# Patient Record
Sex: Female | Born: 1935
Health system: Southern US, Community
[De-identification: ages and names within clinical notes are randomized; demographics above are authoritative.]

## PROBLEM LIST (undated history)

## (undated) DIAGNOSIS — R87629 Unspecified abnormal cytological findings in specimens from vagina: Secondary | ICD-10-CM

## (undated) DIAGNOSIS — R269 Unspecified abnormalities of gait and mobility: Secondary | ICD-10-CM

## (undated) DIAGNOSIS — R1310 Dysphagia, unspecified: Secondary | ICD-10-CM

## (undated) DIAGNOSIS — G960 Cerebrospinal fluid leak: Secondary | ICD-10-CM

## (undated) DIAGNOSIS — E538 Deficiency of other specified B group vitamins: Secondary | ICD-10-CM

## (undated) DIAGNOSIS — R32 Unspecified urinary incontinence: Secondary | ICD-10-CM

## (undated) DIAGNOSIS — Z8673 Personal history of transient ischemic attack (TIA), and cerebral infarction without residual deficits: Secondary | ICD-10-CM

## (undated) DIAGNOSIS — E039 Hypothyroidism, unspecified: Secondary | ICD-10-CM

## (undated) DIAGNOSIS — K589 Irritable bowel syndrome without diarrhea: Secondary | ICD-10-CM

## (undated) DIAGNOSIS — E782 Mixed hyperlipidemia: Secondary | ICD-10-CM

## (undated) DIAGNOSIS — M1990 Unspecified osteoarthritis, unspecified site: Secondary | ICD-10-CM

## (undated) DIAGNOSIS — G14 Postpolio syndrome: Secondary | ICD-10-CM

## (undated) DIAGNOSIS — G252 Other specified forms of tremor: Secondary | ICD-10-CM

## (undated) DIAGNOSIS — G2581 Restless legs syndrome: Secondary | ICD-10-CM

## (undated) DIAGNOSIS — D510 Vitamin B12 deficiency anemia due to intrinsic factor deficiency: Secondary | ICD-10-CM

## (undated) DIAGNOSIS — G20 Parkinson's disease: Secondary | ICD-10-CM

## (undated) DIAGNOSIS — W19XXXA Unspecified fall, initial encounter: Secondary | ICD-10-CM

## (undated) DIAGNOSIS — M542 Cervicalgia: Secondary | ICD-10-CM

## (undated) DIAGNOSIS — R531 Weakness: Secondary | ICD-10-CM

## (undated) DIAGNOSIS — M8580 Other specified disorders of bone density and structure, unspecified site: Secondary | ICD-10-CM

## (undated) HISTORY — DX: Other specified disorders of bone density and structure, unspecified site: M85.80

## (undated) HISTORY — PX: CATARACT EXTRACTION, BILATERAL: SHX1313

## (undated) HISTORY — DX: Irritable bowel syndrome without diarrhea: K58.9

## (undated) HISTORY — DX: Mixed hyperlipidemia: E78.2

## (undated) HISTORY — DX: Unspecified abnormalities of gait and mobility: R26.9

## (undated) HISTORY — DX: Cervicalgia: M54.2

## (undated) HISTORY — DX: Unspecified osteoarthritis, unspecified site: M19.90

## (undated) HISTORY — DX: Dysphagia, unspecified: R13.10

## (undated) HISTORY — DX: Personal history of transient ischemic attack (TIA), and cerebral infarction without residual deficits: Z86.73

## (undated) HISTORY — DX: Unspecified fall, initial encounter: W19.XXXA

## (undated) HISTORY — DX: Unspecified urinary incontinence: R32

## (undated) HISTORY — DX: Postpolio syndrome: G14

## (undated) HISTORY — PX: YAG LASER APPLICATION: SHX6189

## (undated) HISTORY — DX: Weakness: R53.1

## (undated) HISTORY — DX: Other specified forms of tremor: G25.2

## (undated) HISTORY — DX: Vitamin B12 deficiency anemia due to intrinsic factor deficiency: D51.0

## (undated) HISTORY — PX: OTHER SURGICAL HISTORY: SHX169

## (undated) HISTORY — DX: Hypothyroidism, unspecified: E03.9

## (undated) HISTORY — DX: Unspecified abnormal cytological findings in specimens from vagina: R87.629

## (undated) HISTORY — DX: Deficiency of other specified B group vitamins: E53.8

## (undated) HISTORY — DX: Restless legs syndrome: G25.81

## (undated) HISTORY — DX: Cerebrospinal fluid leak: G96.0

## (undated) HISTORY — DX: Parkinson's disease: G20

---

## 1998-11-05 ENCOUNTER — Other Ambulatory Visit: Admission: RE | Admit: 1998-11-05 | Discharge: 1998-11-05 | Payer: Self-pay | Admitting: Obstetrics and Gynecology

## 1998-11-24 HISTORY — PX: BLADDER SUSPENSION: SHX72

## 1999-04-16 ENCOUNTER — Encounter: Payer: Self-pay | Admitting: Emergency Medicine

## 1999-04-16 ENCOUNTER — Emergency Department (HOSPITAL_COMMUNITY): Admission: EM | Admit: 1999-04-16 | Discharge: 1999-04-16 | Payer: Self-pay | Admitting: Emergency Medicine

## 2000-02-12 ENCOUNTER — Other Ambulatory Visit: Admission: RE | Admit: 2000-02-12 | Discharge: 2000-02-12 | Payer: Self-pay | Admitting: Obstetrics and Gynecology

## 2000-03-27 ENCOUNTER — Ambulatory Visit (HOSPITAL_COMMUNITY): Admission: RE | Admit: 2000-03-27 | Discharge: 2000-03-27 | Payer: Self-pay | Admitting: Ophthalmology

## 2001-05-17 ENCOUNTER — Other Ambulatory Visit: Admission: RE | Admit: 2001-05-17 | Discharge: 2001-05-17 | Payer: Self-pay | Admitting: Obstetrics and Gynecology

## 2001-06-11 ENCOUNTER — Emergency Department (HOSPITAL_COMMUNITY): Admission: EM | Admit: 2001-06-11 | Discharge: 2001-06-11 | Payer: Self-pay | Admitting: Emergency Medicine

## 2001-06-11 ENCOUNTER — Encounter: Payer: Self-pay | Admitting: Emergency Medicine

## 2002-01-01 ENCOUNTER — Emergency Department (HOSPITAL_COMMUNITY): Admission: EM | Admit: 2002-01-01 | Discharge: 2002-01-01 | Payer: Self-pay | Admitting: Emergency Medicine

## 2002-01-01 ENCOUNTER — Encounter: Payer: Self-pay | Admitting: Emergency Medicine

## 2002-05-31 ENCOUNTER — Other Ambulatory Visit: Admission: RE | Admit: 2002-05-31 | Discharge: 2002-05-31 | Payer: Self-pay | Admitting: Obstetrics & Gynecology

## 2004-08-22 ENCOUNTER — Other Ambulatory Visit: Admission: RE | Admit: 2004-08-22 | Discharge: 2004-08-22 | Payer: Self-pay | Admitting: Obstetrics & Gynecology

## 2005-09-09 ENCOUNTER — Ambulatory Visit: Payer: Self-pay | Admitting: Internal Medicine

## 2005-09-11 ENCOUNTER — Other Ambulatory Visit: Admission: RE | Admit: 2005-09-11 | Discharge: 2005-09-11 | Payer: Self-pay | Admitting: Obstetrics & Gynecology

## 2005-12-16 ENCOUNTER — Ambulatory Visit: Payer: Self-pay | Admitting: Internal Medicine

## 2006-02-19 ENCOUNTER — Ambulatory Visit: Payer: Self-pay | Admitting: Internal Medicine

## 2006-06-18 ENCOUNTER — Ambulatory Visit: Payer: Self-pay | Admitting: Internal Medicine

## 2006-07-01 ENCOUNTER — Ambulatory Visit: Payer: Self-pay | Admitting: Internal Medicine

## 2006-07-08 ENCOUNTER — Ambulatory Visit: Payer: Self-pay | Admitting: Internal Medicine

## 2006-07-29 ENCOUNTER — Ambulatory Visit: Payer: Self-pay | Admitting: Internal Medicine

## 2006-09-16 ENCOUNTER — Ambulatory Visit: Payer: Self-pay | Admitting: Internal Medicine

## 2006-09-24 DIAGNOSIS — R87629 Unspecified abnormal cytological findings in specimens from vagina: Secondary | ICD-10-CM | POA: Insufficient documentation

## 2006-09-24 HISTORY — DX: Unspecified abnormal cytological findings in specimens from vagina: R87.629

## 2007-02-26 DIAGNOSIS — Z9889 Other specified postprocedural states: Secondary | ICD-10-CM

## 2007-11-17 ENCOUNTER — Encounter: Payer: Self-pay | Admitting: Internal Medicine

## 2008-01-04 ENCOUNTER — Telehealth (INDEPENDENT_AMBULATORY_CARE_PROVIDER_SITE_OTHER): Payer: Self-pay | Admitting: *Deleted

## 2008-01-04 ENCOUNTER — Ambulatory Visit: Payer: Self-pay | Admitting: Internal Medicine

## 2008-01-04 DIAGNOSIS — M8580 Other specified disorders of bone density and structure, unspecified site: Secondary | ICD-10-CM | POA: Insufficient documentation

## 2008-01-04 DIAGNOSIS — E039 Hypothyroidism, unspecified: Secondary | ICD-10-CM

## 2008-01-11 ENCOUNTER — Ambulatory Visit: Payer: Self-pay | Admitting: Internal Medicine

## 2008-01-12 ENCOUNTER — Encounter: Payer: Self-pay | Admitting: Internal Medicine

## 2008-01-13 ENCOUNTER — Encounter (INDEPENDENT_AMBULATORY_CARE_PROVIDER_SITE_OTHER): Payer: Self-pay | Admitting: *Deleted

## 2008-01-13 LAB — CONVERTED CEMR LAB
Basophils Absolute: 0 10*3/uL (ref 0.0–0.1)
Basophils Relative: 0.4 % (ref 0.0–1.0)
Eosinophils Absolute: 0.2 10*3/uL (ref 0.0–0.6)
Eosinophils Relative: 3.4 % (ref 0.0–5.0)
HCT: 37.5 % (ref 36.0–46.0)
Hemoglobin: 12.7 g/dL (ref 12.0–15.0)
Lymphocytes Relative: 30.7 % (ref 12.0–46.0)
MCHC: 33.9 g/dL (ref 30.0–36.0)
MCV: 91.2 fL (ref 78.0–100.0)
Monocytes Absolute: 0.4 10*3/uL (ref 0.2–0.7)
Monocytes Relative: 8.8 % (ref 3.0–11.0)
Neutro Abs: 2.7 10*3/uL (ref 1.4–7.7)
Neutrophils Relative %: 56.7 % (ref 43.0–77.0)
Platelets: 212 10*3/uL (ref 150–400)
RBC: 4.11 M/uL (ref 3.87–5.11)
RDW: 12.4 % (ref 11.5–14.6)
TSH: 7.82 microintl units/mL — ABNORMAL HIGH (ref 0.35–5.50)
Vit D, 1,25-Dihydroxy: 10 — ABNORMAL LOW (ref 30–89)
Vitamin B-12: 149 pg/mL — ABNORMAL LOW (ref 211–911)
WBC: 4.8 10*3/uL (ref 4.5–10.5)

## 2008-01-14 ENCOUNTER — Ambulatory Visit: Payer: Self-pay | Admitting: Internal Medicine

## 2008-01-21 ENCOUNTER — Encounter (INDEPENDENT_AMBULATORY_CARE_PROVIDER_SITE_OTHER): Payer: Self-pay | Admitting: *Deleted

## 2008-01-26 ENCOUNTER — Ambulatory Visit: Payer: Self-pay | Admitting: Internal Medicine

## 2008-01-26 DIAGNOSIS — R87615 Unsatisfactory cytologic smear of cervix: Secondary | ICD-10-CM | POA: Insufficient documentation

## 2008-01-26 DIAGNOSIS — Z8673 Personal history of transient ischemic attack (TIA), and cerebral infarction without residual deficits: Secondary | ICD-10-CM

## 2008-01-26 DIAGNOSIS — E782 Mixed hyperlipidemia: Secondary | ICD-10-CM

## 2008-01-26 DIAGNOSIS — N949 Unspecified condition associated with female genital organs and menstrual cycle: Secondary | ICD-10-CM | POA: Insufficient documentation

## 2008-01-26 HISTORY — DX: Mixed hyperlipidemia: E78.2

## 2008-01-26 HISTORY — DX: Personal history of transient ischemic attack (TIA), and cerebral infarction without residual deficits: Z86.73

## 2008-01-26 LAB — CONVERTED CEMR LAB
Cholesterol, target level: 200 mg/dL
HDL goal, serum: 40 mg/dL
LDL Goal: 160 mg/dL

## 2008-02-02 ENCOUNTER — Ambulatory Visit: Payer: Self-pay | Admitting: Internal Medicine

## 2008-02-07 ENCOUNTER — Ambulatory Visit: Payer: Self-pay | Admitting: Internal Medicine

## 2008-02-08 ENCOUNTER — Encounter (INDEPENDENT_AMBULATORY_CARE_PROVIDER_SITE_OTHER): Payer: Self-pay | Admitting: *Deleted

## 2008-02-11 ENCOUNTER — Telehealth (INDEPENDENT_AMBULATORY_CARE_PROVIDER_SITE_OTHER): Payer: Self-pay | Admitting: *Deleted

## 2008-02-14 ENCOUNTER — Ambulatory Visit: Payer: Self-pay | Admitting: Internal Medicine

## 2008-02-28 ENCOUNTER — Ambulatory Visit: Payer: Self-pay | Admitting: Internal Medicine

## 2008-03-27 ENCOUNTER — Ambulatory Visit: Payer: Self-pay | Admitting: Internal Medicine

## 2008-03-29 ENCOUNTER — Encounter: Payer: Self-pay | Admitting: Internal Medicine

## 2008-04-12 DIAGNOSIS — R198 Other specified symptoms and signs involving the digestive system and abdomen: Secondary | ICD-10-CM

## 2008-04-12 DIAGNOSIS — K219 Gastro-esophageal reflux disease without esophagitis: Secondary | ICD-10-CM

## 2008-04-18 ENCOUNTER — Ambulatory Visit: Payer: Self-pay

## 2008-04-18 ENCOUNTER — Encounter (INDEPENDENT_AMBULATORY_CARE_PROVIDER_SITE_OTHER): Payer: Self-pay | Admitting: Neurology

## 2008-04-20 ENCOUNTER — Encounter: Admission: RE | Admit: 2008-04-20 | Discharge: 2008-04-20 | Payer: Self-pay | Admitting: Neurology

## 2008-04-26 ENCOUNTER — Ambulatory Visit: Payer: Self-pay | Admitting: Internal Medicine

## 2008-04-26 DIAGNOSIS — D649 Anemia, unspecified: Secondary | ICD-10-CM | POA: Insufficient documentation

## 2008-05-08 LAB — CONVERTED CEMR LAB
ALT: 18 units/L (ref 0–35)
Alkaline Phosphatase: 46 units/L (ref 39–117)
Bilirubin, Direct: 0.1 mg/dL (ref 0.0–0.3)
Total Bilirubin: 0.8 mg/dL (ref 0.3–1.2)
Total CK: 181 units/L — ABNORMAL HIGH (ref 7–177)
Total Protein: 6.1 g/dL (ref 6.0–8.3)

## 2008-05-24 ENCOUNTER — Ambulatory Visit: Payer: Self-pay | Admitting: Internal Medicine

## 2008-06-15 ENCOUNTER — Ambulatory Visit: Payer: Self-pay | Admitting: Internal Medicine

## 2008-06-16 ENCOUNTER — Telehealth: Payer: Self-pay | Admitting: Internal Medicine

## 2008-06-16 LAB — CONVERTED CEMR LAB
Basophils Absolute: 0 10*3/uL (ref 0.0–0.1)
Basophils Relative: 0.7 % (ref 0.0–3.0)
HCT: 34.6 % — ABNORMAL LOW (ref 36.0–46.0)
Hemoglobin: 12 g/dL (ref 12.0–15.0)
Lymphocytes Relative: 25.4 % (ref 12.0–46.0)
MCHC: 34.8 g/dL (ref 30.0–36.0)
Monocytes Absolute: 0.4 10*3/uL (ref 0.1–1.0)
Monocytes Relative: 8.7 % (ref 3.0–12.0)
Neutro Abs: 3.1 10*3/uL (ref 1.4–7.7)
RBC: 3.73 M/uL — ABNORMAL LOW (ref 3.87–5.11)
RDW: 12.8 % (ref 11.5–14.6)
Saturation Ratios: 27.2 % (ref 20.0–50.0)
Transferrin: 257.6 mg/dL (ref >=212.0)
Vitamin B-12: 266 pg/mL (ref 211–911)

## 2008-06-17 ENCOUNTER — Encounter: Payer: Self-pay | Admitting: Internal Medicine

## 2008-06-22 ENCOUNTER — Telehealth: Payer: Self-pay | Admitting: Internal Medicine

## 2008-06-26 ENCOUNTER — Ambulatory Visit: Payer: Self-pay | Admitting: Internal Medicine

## 2008-08-03 ENCOUNTER — Ambulatory Visit: Payer: Self-pay | Admitting: Internal Medicine

## 2008-08-30 ENCOUNTER — Ambulatory Visit: Payer: Self-pay | Admitting: Internal Medicine

## 2008-09-04 ENCOUNTER — Encounter (INDEPENDENT_AMBULATORY_CARE_PROVIDER_SITE_OTHER): Payer: Self-pay | Admitting: *Deleted

## 2008-09-04 LAB — CONVERTED CEMR LAB
AST: 24 units/L (ref 0–37)
Albumin: 3.6 g/dL (ref 3.5–5.2)
Alkaline Phosphatase: 46 units/L (ref 39–117)
Cholesterol: 140 mg/dL (ref 0–200)
HDL: 46.9 mg/dL (ref >39.0)
Total Protein: 6.6 g/dL (ref 6.0–8.3)
Triglycerides: 68 mg/dL (ref 0–149)

## 2008-09-06 ENCOUNTER — Ambulatory Visit: Payer: Self-pay | Admitting: Internal Medicine

## 2008-09-07 ENCOUNTER — Telehealth (INDEPENDENT_AMBULATORY_CARE_PROVIDER_SITE_OTHER): Payer: Self-pay | Admitting: *Deleted

## 2008-09-07 DIAGNOSIS — R32 Unspecified urinary incontinence: Secondary | ICD-10-CM

## 2008-09-07 HISTORY — DX: Unspecified urinary incontinence: R32

## 2008-09-07 LAB — CONVERTED CEMR LAB: Free T4: 0.5 ng/dL — ABNORMAL LOW (ref 0.6–1.6)

## 2008-09-11 ENCOUNTER — Telehealth: Payer: Self-pay | Admitting: Internal Medicine

## 2008-10-06 ENCOUNTER — Encounter: Payer: Self-pay | Admitting: Internal Medicine

## 2008-11-03 ENCOUNTER — Ambulatory Visit: Payer: Self-pay | Admitting: Internal Medicine

## 2008-11-15 ENCOUNTER — Encounter: Payer: Self-pay | Admitting: Internal Medicine

## 2009-01-03 ENCOUNTER — Ambulatory Visit: Payer: Self-pay | Admitting: Internal Medicine

## 2009-01-08 ENCOUNTER — Encounter (INDEPENDENT_AMBULATORY_CARE_PROVIDER_SITE_OTHER): Payer: Self-pay | Admitting: *Deleted

## 2009-01-17 ENCOUNTER — Ambulatory Visit: Payer: Self-pay | Admitting: Internal Medicine

## 2009-01-17 DIAGNOSIS — M542 Cervicalgia: Secondary | ICD-10-CM

## 2009-01-17 DIAGNOSIS — K589 Irritable bowel syndrome without diarrhea: Secondary | ICD-10-CM

## 2009-01-17 HISTORY — DX: Irritable bowel syndrome, unspecified: K58.9

## 2009-01-17 HISTORY — DX: Cervicalgia: M54.2

## 2009-01-22 ENCOUNTER — Telehealth: Payer: Self-pay | Admitting: Internal Medicine

## 2009-02-09 ENCOUNTER — Ambulatory Visit: Payer: Self-pay | Admitting: Internal Medicine

## 2009-02-20 ENCOUNTER — Telehealth: Payer: Self-pay | Admitting: Internal Medicine

## 2009-03-27 ENCOUNTER — Ambulatory Visit: Payer: Self-pay | Admitting: Internal Medicine

## 2009-04-27 ENCOUNTER — Ambulatory Visit: Payer: Self-pay | Admitting: Internal Medicine

## 2009-05-16 ENCOUNTER — Encounter: Payer: Self-pay | Admitting: Internal Medicine

## 2009-06-06 ENCOUNTER — Ambulatory Visit: Payer: Self-pay | Admitting: Internal Medicine

## 2009-06-19 ENCOUNTER — Encounter: Payer: Self-pay | Admitting: Internal Medicine

## 2009-07-02 ENCOUNTER — Ambulatory Visit: Payer: Self-pay | Admitting: Internal Medicine

## 2009-07-02 ENCOUNTER — Encounter (INDEPENDENT_AMBULATORY_CARE_PROVIDER_SITE_OTHER): Payer: Self-pay | Admitting: *Deleted

## 2009-07-09 ENCOUNTER — Ambulatory Visit: Payer: Self-pay | Admitting: Internal Medicine

## 2009-07-09 DIAGNOSIS — R946 Abnormal results of thyroid function studies: Secondary | ICD-10-CM | POA: Insufficient documentation

## 2009-07-31 ENCOUNTER — Ambulatory Visit: Payer: Self-pay | Admitting: Internal Medicine

## 2009-09-17 ENCOUNTER — Ambulatory Visit: Payer: Self-pay | Admitting: Internal Medicine

## 2009-10-15 ENCOUNTER — Ambulatory Visit: Payer: Self-pay | Admitting: Internal Medicine

## 2009-10-23 LAB — CONVERTED CEMR LAB
Free T4: 0.8 ng/dL (ref 0.6–1.6)
T3, Free: 3 pg/mL (ref 2.3–4.2)
TSH: 2.4 microintl units/mL (ref 0.35–5.50)

## 2009-10-29 ENCOUNTER — Telehealth (INDEPENDENT_AMBULATORY_CARE_PROVIDER_SITE_OTHER): Payer: Self-pay | Admitting: *Deleted

## 2009-11-07 ENCOUNTER — Ambulatory Visit: Payer: Self-pay | Admitting: Internal Medicine

## 2009-12-14 ENCOUNTER — Ambulatory Visit: Payer: Self-pay | Admitting: Internal Medicine

## 2009-12-17 LAB — CONVERTED CEMR LAB
ALT: 20 units/L (ref 0–35)
AST: 29 units/L (ref 0–37)
BUN: 8 mg/dL (ref 6–23)
Basophils Absolute: 0 10*3/uL (ref 0.0–0.1)
Basophils Relative: 0.4 % (ref 0.0–3.0)
CO2: 26 meq/L (ref 19–32)
Chloride: 107 meq/L (ref 96–112)
Cholesterol: 193 mg/dL (ref 0–200)
Creatinine, Ser: 0.9 mg/dL (ref 0.4–1.2)
HCT: 36.7 % (ref 36.0–46.0)
Hemoglobin: 12.3 g/dL (ref 12.0–15.0)
Lymphs Abs: 1.3 10*3/uL (ref 0.7–4.0)
MCHC: 33.5 g/dL (ref 30.0–36.0)
Monocytes Relative: 7.6 % (ref 3.0–12.0)
Neutro Abs: 3.1 10*3/uL (ref 1.4–7.7)
Potassium: 4.1 meq/L (ref 3.5–5.1)
RBC: 3.91 M/uL (ref 3.87–5.11)
RDW: 12.8 % (ref 11.5–14.6)
Sodium: 139 meq/L (ref 135–145)
Total CHOL/HDL Ratio: 4
Total Protein: 7.4 g/dL (ref 6.0–8.3)
Triglycerides: 122 mg/dL (ref 0.0–149.0)
Vitamin B-12: 237 pg/mL (ref 211–911)

## 2009-12-24 LAB — CONVERTED CEMR LAB: Hgb A1c MFr Bld: 5.8 % (ref 4.6–6.5)

## 2009-12-27 ENCOUNTER — Ambulatory Visit: Payer: Self-pay | Admitting: Internal Medicine

## 2010-02-13 ENCOUNTER — Ambulatory Visit: Payer: Self-pay | Admitting: Internal Medicine

## 2010-03-06 ENCOUNTER — Ambulatory Visit: Payer: Self-pay | Admitting: Internal Medicine

## 2010-03-25 ENCOUNTER — Ambulatory Visit: Payer: Self-pay | Admitting: Internal Medicine

## 2010-03-25 LAB — CONVERTED CEMR LAB
AST: 24 units/L (ref 0–37)
Albumin: 3.7 g/dL (ref 3.5–5.2)
Alkaline Phosphatase: 52 units/L (ref 39–117)
Cholesterol: 146 mg/dL (ref 0–200)
HDL: 43.1 mg/dL (ref >39.00)
LDL Cholesterol: 90 mg/dL (ref 0–99)
Total Bilirubin: 0.6 mg/dL (ref 0.3–1.2)
VLDL: 12.8 mg/dL (ref 0.0–40.0)

## 2010-04-04 ENCOUNTER — Ambulatory Visit: Payer: Self-pay | Admitting: Internal Medicine

## 2010-05-10 ENCOUNTER — Telehealth (INDEPENDENT_AMBULATORY_CARE_PROVIDER_SITE_OTHER): Payer: Self-pay | Admitting: *Deleted

## 2010-06-10 ENCOUNTER — Ambulatory Visit: Payer: Self-pay | Admitting: Internal Medicine

## 2010-06-17 LAB — CONVERTED CEMR LAB
ALT: 17 units/L (ref 0–35)
AST: 28 units/L (ref 0–37)
Alkaline Phosphatase: 52 units/L (ref 39–117)
Total Bilirubin: 0.8 mg/dL (ref 0.3–1.2)
Total CHOL/HDL Ratio: 3

## 2010-06-27 ENCOUNTER — Encounter: Payer: Self-pay | Admitting: Internal Medicine

## 2010-06-27 ENCOUNTER — Ambulatory Visit: Payer: Self-pay | Admitting: Internal Medicine

## 2010-06-28 ENCOUNTER — Telehealth (INDEPENDENT_AMBULATORY_CARE_PROVIDER_SITE_OTHER): Payer: Self-pay | Admitting: *Deleted

## 2010-08-15 ENCOUNTER — Ambulatory Visit: Payer: Self-pay | Admitting: Internal Medicine

## 2010-09-17 ENCOUNTER — Ambulatory Visit: Payer: Self-pay | Admitting: Internal Medicine

## 2010-10-24 ENCOUNTER — Ambulatory Visit: Payer: Self-pay | Admitting: Internal Medicine

## 2010-10-27 LAB — CONVERTED CEMR LAB
ALT: 18 units/L (ref 0–35)
AST: 29 units/L (ref 0–37)
Albumin: 4 g/dL (ref 3.5–5.2)
Alkaline Phosphatase: 56 units/L (ref 39–117)
Basophils Relative: 0.7 % (ref 0.0–3.0)
Cholesterol: 150 mg/dL (ref 0–200)
Eosinophils Relative: 2.1 % (ref 0.0–5.0)
HCT: 36.4 % (ref 36.0–46.0)
Hemoglobin: 12.6 g/dL (ref 12.0–15.0)
Lymphs Abs: 1 10*3/uL (ref 0.7–4.0)
MCV: 92.5 fL (ref 78.0–100.0)
Monocytes Absolute: 0.3 10*3/uL (ref 0.1–1.0)
Neutro Abs: 3 10*3/uL (ref 1.4–7.7)
Neutrophils Relative %: 66.6 % (ref 43.0–77.0)
RBC: 3.94 M/uL (ref 3.87–5.11)
Total Protein: 6.6 g/dL (ref 6.0–8.3)
WBC: 4.5 10*3/uL (ref 4.5–10.5)

## 2010-10-31 ENCOUNTER — Ambulatory Visit: Payer: Self-pay | Admitting: Internal Medicine

## 2010-12-20 ENCOUNTER — Ambulatory Visit
Admission: RE | Admit: 2010-12-20 | Discharge: 2010-12-20 | Payer: Self-pay | Source: Home / Self Care | Attending: Internal Medicine | Admitting: Internal Medicine

## 2010-12-26 NOTE — Progress Notes (Signed)
Summary: RX Concerns  Phone Note Call from Patient Call back at Home Phone [phone]   Caller: Patient Summary of Call: PT HAS LAST THE SCRIPT FOR CRESTOR THAT WAS GIVEN TO HER YESTERDAY AND LIKE A NEW ONE SENT TO THE  [facility]. Initial call taken by: Freddy Jaksch,  June 28, 2010 11:12 AM  Follow-up for Phone Call        [facility] to send or was patient to present this prescription for cash pay? Please advise. Lucious Groves CMA  June 28, 2010 11:53 AM   Additional Follow-up for Phone Call Additional follow up Details #1::        I called patient to clarify message, patient said the rx that was given to her she is unable to locate and she needs a new one. Patient would like this mailed to her because she doesnt need to fill it right now.  RX reprinted and mailed to patient. Additional Follow-up by: Shonna Chock CMA,  June 28, 2010 12:35 PM    Prescriptions: CRESTOR 10 MG TABS (ROSUVASTATIN CALCIUM) 1 pill M, W,F & Sun  #30 x 5   Entered by:   Shonna Chock CMA   Authorized by:   Marga Melnick MD   Signed by:   Shonna Chock CMA on 06/28/2010   Method used:   Print then Mail to Patient   RxID:   [id number]

## 2010-12-26 NOTE — Assessment & Plan Note (Signed)
Summary: fu on bloodwork/kdc   Vital Signs:  Patient profile:   77 year old female Weight:      95.8 pounds Pulse rate:   58 / minute Resp:     14 per minute BP sitting:   120 / 74  (left arm)  Vitals Entered By: Doristine Devoid (December 27, 2009 3:59 PM) CC: discuss labs , Lipid Management   Primary Care Provider:  Marga Melnick, M.D.  CC:  discuss labs  and Lipid Management.  History of Present Illness: Elizabeth [name] is here for review of labs & discussion of risks. B12 is low normal on monthly injections. FBS109 but A1c was non Diabetic. LDL 119 on Crestor 5 mg M,W, & F. Vitamin D level 48 on 1000IU daily. She is on no specific diet. CVE @ gym  Lipid Management History:      Positive NCEP/ATP III risk factors include female age 18 years old or older and prior stroke (or TIA).  Negative NCEP/ATP III risk factors include no history of early menopause without estrogen hormone replacement, non-diabetic, no family history for ischemic heart disease, non-tobacco-user status, non-hypertensive, no ASHD (atherosclerotic heart disease), no peripheral vascular disease, and no history of aortic aneurysm.     Allergies: No Known Drug Allergies  Past History:  Past Medical History: B12 deficiency/ Pernicious Anemia Hypothyroidism Osteopenia; PMH  fracture  heel, foot bilat  Hyperlipidemia: NMR Lipoprofile : LDL 135(1704/911)HDL 62, TG 131. LDL goal = < 100 Abnormal PAP 11/07  Past Surgical History: Bladder tact 2000 No colonoscopy ("I don't feel like I need  one; no problems")SOC reviewed  Review of Systems General:  Denies fatigue. Eyes:  Denies blurring, double vision, and vision loss-both eyes. ENT:  Denies difficulty swallowing and hoarseness. CV:  Denies chest pain or discomfort, leg cramps with exertion, shortness of breath with exertion, swelling of feet, and swelling of hands. Resp:  Denies cough, shortness of breath, and sputum productive. GI:  Denies abdominal pain, bloody  stools, dark tarry stools, and indigestion. GU:  Denies discharge, dysuria, and hematuria; Dr Jennette Kettle seen annually. MS:  Denies joint pain and joint swelling; Dr Lestine Box treated calluses. Derm:  Denies changes in nail beds, dryness, hair loss, lesion(s), and rash. Neuro:  Denies numbness and tingling. Psych:  Denies anxiety and depression. Endo:  Complains of cold intolerance; denies excessive hunger, excessive thirst, excessive urination, and heat intolerance.  Physical Exam  General:  Thin ,appears younger than age, alert,appropriate and cooperative throughout examination Neck:  No deformities, masses, or tenderness noted. Lungs:  Normal respiratory effort, chest expands symmetrically. Lungs are clear to auscultation, no crackles or wheezes. Heart:  Normal rate and regular rhythm. S1 and S2 normal without gallop, murmur, click, rub . S4 with slurring  Abdomen:  Bowel sounds positive,abdomen soft and non-tender without masses, organomegaly or hernias noted. Aortic bruit without AAA Genitalia:  Dr Jennette Kettle Pulses:  R and L carotid,radial and posterior tibial pulses are full and equal bilaterally,Decreased DPP. L carotid bruit Extremities:  No clubbing, cyanosis, edema. OA hand changes Neurologic:  alert & oriented X3 and DTRs symmetrical and normal.   Skin:  Intact without suspicious lesions or rashes Cervical Nodes:  No lymphadenopathy noted Axillary Nodes:  No palpable lymphadenopathy Psych:  memory intact for recent and remote, normally interactive, and good eye contact.     Impression & Recommendations:  Problem # 1:  HYPERLIPIDEMIA (ICD-272.2)  LDL not @ goal of < 100  Orders: Prescription Created Electronically [phone])  Problem #  2:  ANEMIA, B12 DEFICIENCY (ICD-281.1)  Orders: Vit B12 1000 mcg (J3420) Admin of Therapeutic Inj  intramuscular or subcutaneous (29562)  Problem # 3:  HYPOTHYROIDISM (ICD-244.9) TSH therapeutic Her updated medication list for this problem  includes:    Levothyroxine Sodium 25 Mcg Tabs (Levothyroxine sodium) .Marland Kitchen... 1 once daily  Problem # 4:  TIA (ICD-435.9) PMH of Her updated medication list for this problem includes:    Aspirin 81 Mg Tbec (Aspirin) .Marland Kitchen... 1 tablet by mouth once daily  Problem # 5:  OSTEOPENIA (ICD-733.90)  Complete Medication List: 1)  Premarin 0.625 Mg/gm Crea (Estrogens, conjugated) .Marland Kitchen.. 1 tablet by mouth once daily days 1-25 2)  Vitamin D 1000 Unit Tabs (Cholecalciferol) .Marland Kitchen.. 1tablet by mouth once daily 3)  Norethindrone 5mg   .... Days 16-27 4)  Aspirin 81 Mg Tbec (Aspirin) .Marland Kitchen.. 1 tablet by mouth once daily 5)  Crestor 10 Mg Tabs (rosuvastatin Calcium)  .Marland Kitchen.. 1 pill mon,weds,fri 6)  Vitamin D 400 Unit Caps (Cholecalciferol) .... 2 capsules by mouth once daily 7)  Bentyl 20 Mg Caps (dicyclomine Hcl)  .... Take one by mouth qid as needed 8)  Cyclobenzaprine Hcl 5 Mg Tabs (Cyclobenzaprine hcl) .Marland Kitchen.. 1 at bedtime as needed 9)  Celebrex 200 Mg Caps (Celecoxib) .Marland Kitchen.. 1 two times a day as needed 10)  Levothyroxine Sodium 25 Mcg Tabs (Levothyroxine sodium) .Marland Kitchen.. 1 once daily  Lipid Assessment/Plan:      Based on NCEP/ATP III, the patient's risk factor category is "history of coronary disease, peripheral vascular disease, cerebrovascular disease, or aortic aneurysm".  The patient's lipid goals are as follows: Total cholesterol goal is 200; LDL cholesterol goal is 100; HDL cholesterol goal is 40; Triglyceride goal is 150.  Her LDL cholesterol goal has not been met.  Secondary causes for hyperlipidemia have been ruled out.  She has been counseled on adjunctive measures for lowering her cholesterol and has been provided with dietary instructions.    Patient Instructions: 1)  Please schedule a follow-up appointment in 3 months. 2)  Hepatic Panel prior to visit, ICD-9:995.20 3)  Lipid Panel prior to visit, ICD-9:272.4. Your LDL goal = < 100. Prescriptions: CRESTOR 10  MG  TABS (ROSUVASTATIN CALCIUM) 1 pill Mon,Weds,Fri   #30 x 5   Entered and Authorized by:   Marga Melnick MD   Signed by:   Marga Melnick MD on 12/27/2009   Method used:   Faxed to ...       [facility]* (retail)       [address]       [place], [place]  [id number]       Ph: [phone]       Fax: [phone]   RxID:   [id number]    Medication Administration  Injection # 1:    Medication: Vit B12 1000 mcg    Diagnosis: ANEMIA, B12 DEFICIENCY (ICD-281.1)    Route: IM    Site: RUOQ gluteus    Exp Date: 03/25/2011    Lot #: [id number]    Patient tolerated injection without complications    Given by: Shary Decamp (December 27, 2009 5:08 PM)  Orders Added: 1)  Vit B12 1000 mcg [J3420] 2)  Admin of Therapeutic Inj  intramuscular or subcutaneous [96372] 3)  Est. Patient Level IV [64332] 4)  Prescription Created Electronically [phone]

## 2010-12-26 NOTE — Progress Notes (Signed)
Summary: REFILL REQUEST  Phone Note Refill Request Call back at [phone] Message from:  Pharmacy on May 10, 2010 11:22 AM  Refills Requested: Medication #1:  LEVOTHYROXINE SODIUM 25 MCG TABS 1 once daily   Dosage confirmed as above?Dosage Confirmed   Supply Requested: 3 months   Last Refilled: 10/28/2009 [facility]  Next Appointment Scheduled: JULY 18TH 2011 Initial call taken by: Lavell Islam,  May 10, 2010 11:23 AM    Prescriptions: LEVOTHYROXINE SODIUM 25 MCG TABS (LEVOTHYROXINE SODIUM) 1 once daily  #90 x 2   Entered by:   Shonna Chock   Authorized by:   Marga Melnick MD   Signed by:   Shonna Chock on 05/10/2010   Method used:   Electronically to        [facility]* (retail)       [address]       [place], [place]  [id number]       Ph: [phone]       Fax: [phone]   RxID:   [id number]

## 2010-12-26 NOTE — Assessment & Plan Note (Signed)
Summary: B-12//PH  Nurse Visit   Allergies: No Known Drug Allergies  Medication Administration  Injection # 1:    Medication: Vit B12 1000 mcg    Diagnosis: ANEMIA, B12 DEFICIENCY (ICD-281.1)    Route: IM    Site: R deltoid    Exp Date: 04/2011    Lot #: [id number]    Mfr: [employer] Regent    Patient tolerated injection without complications    Given by: Shonna Chock (February 13, 2010 2:24 PM)  Orders Added: 1)  Vit B12 1000 mcg [J3420] 2)  Admin of Therapeutic Inj  intramuscular or subcutaneous [47829]

## 2010-12-26 NOTE — Assessment & Plan Note (Signed)
Summary: b12 shot/kdc  Nurse Visit   Allergies: No Known Drug Allergies  Medication Administration  Injection # 1:    Medication: Vit B12 1000 mcg    Diagnosis: ANEMIA, B12 DEFICIENCY (ICD-281.1)    Route: IM    Site: R deltoid    Exp Date: 12/2011    Lot #: [id number]    Mfr: [employer] Regent    Patient tolerated injection without complications    Given by: Shonna Chock (March 06, 2010 4:11 PM)  Orders Added: 1)  Vit B12 1000 mcg [J3420] 2)  Admin of Therapeutic Inj  intramuscular or subcutaneous [16109]

## 2010-12-26 NOTE — Assessment & Plan Note (Signed)
Summary: B12 SHOT/CDJ  Nurse Visit   Allergies: 1)  ! Lipitor  Medication Administration  Injection # 1:    Medication: Vit B12 1000 mcg    Diagnosis: ANEMIA, B12 DEFICIENCY (ICD-281.1)    Route: IM    Site: R deltoid    Exp Date: 01/2012    Lot #: [id number]    Mfr: [employer] Regent    Given by: Shonna Chock CMA (August 15, 2010 3:32 PM)  Orders Added: 1)  Vit B12 1000 mcg [J3420] 2)  Admin of Therapeutic Inj  intramuscular or subcutaneous [60454]

## 2010-12-26 NOTE — Assessment & Plan Note (Signed)
Summary: DISCUSS LAB RESULTS , ESP LDL INFO////SPH   Vital Signs:  Patient profile:   76 year old female Weight:      98.4 pounds BMI:     19.29 Pulse rate:   72 / minute Resp:     15 per minute BP sitting:   98 / 60  (left arm) Cuff size:   regular  Vitals Entered By: Shonna Chock CMA (June 27, 2010 3:34 PM) CC: Follow-up visit: discuss labs (copy given), Lipid Management   Primary Care Provider:  Marga Melnick, M.D.  CC:  Follow-up visit: discuss labs (copy given) and Lipid Management.  History of Present Illness:  Hyperlipidemia Follow-Up      This is a 77 year old woman who presents for Hyperlipidemia follow-up.  The patient denies muscle aches, GI upset, abdominal pain, flushing, itching, constipation, diarrhea, and fatigue.  The patient denies the following symptoms: chest pain/pressure, exercise intolerance, dypsnea, palpitations, syncope, and pedal edema.  Compliance with medications (by patient report) has been near 100%( Crestor 10 mg 3X/week).  Dietary compliance has been good.  The patient reports exercising 2-3 X per week.  Adjunctive measures currently used by the patient include ASA. Because of  PMH of TIA, LDL goal = < 100.LDL now 120.8. Copy of labs reviewed & options & risks discussed.  Lipid Management History:      Positive NCEP/ATP III risk factors include female age 78 years old or older and prior stroke (or TIA).  Negative NCEP/ATP III risk factors include no history of early menopause without estrogen hormone replacement, non-diabetic, HDL cholesterol greater than 60, no family history for ischemic heart disease, non-tobacco-user status, non-hypertensive, no ASHD (atherosclerotic heart disease), no peripheral vascular disease, and no history of aortic aneurysm.     Allergies (verified): 1)  ! Lipitor  Past History:  Past Medical History: B12 deficiency/ Pernicious Anemia Hypothyroidism Osteopenia; PMH  fracture  heel, foot bilat  Hyperlipidemia: NMR  Lipoprofile : LDL 135(1704/911)HDL 62, TG 131. LDL goal = < 100 because of PMH of TIA. Abnormal PAP 11/07  Family History: Maternal Grandmother: breast cancer Maternal Grandfather: died MI in 38s Mother:Emphysema, HTN  Father : [place]'s  Social History: Patient has never smoked.   Physical Exam  General:  Thin but well-nourished;alert,appropriate and cooperative throughout examination.Appears years younger than age Lungs:  Normal respiratory effort, chest expands symmetrically. Lungs are clear to auscultation, no crackles or wheezes. Heart:  Normal rate and regular rhythm. S1 and S2 normal without gallop, murmur, click, rub or other extra sounds. Pulses:  R and L carotid,radial,dorsalis pedis and posterior tibial pulses are full and equal bilaterally Extremities:  No clubbing, cyanosis, edema. Neurologic:  alert & oriented X3.     Impression & Recommendations:  Problem # 1:  HYPERLIPIDEMIA (ICD-272.2)  The following medications were removed from the medication list:    Lipitor 20 Mg Tabs (Atorvastatin calcium) .Marland Kitchen... 1 pill m,w,f Her updated medication list for this problem includes:    Crestor 10 Mg Tabs (Rosuvastatin calcium) .Marland Kitchen... 1 pill m, w,f & sun  Problem # 2:  ANEMIA, B12 DEFICIENCY (ICD-281.1)  Orders: Vit B12 1000 mcg (J3420) Admin of Therapeutic Inj  intramuscular or subcutaneous (04540)  Complete Medication List: 1)  Premarin 0.625 Mg/gm Crea (Estrogens, conjugated) .Marland Kitchen.. 1 tablet by mouth once daily days 1-25 2)  Vitamin D 1000 Unit Tabs (Cholecalciferol) .Marland Kitchen.. 1tablet by mouth once daily 3)  Norethindrone 5mg   .... Days 16-27 4)  Aspirin 81 Mg Tbec (  Aspirin) .Marland Kitchen.. 1 tablet by mouth once daily 5)  Vitamin D 400 Unit Caps (Cholecalciferol) .... 2 capsules by mouth once daily 6)  Cyclobenzaprine Hcl 5 Mg Tabs (Cyclobenzaprine hcl) .Marland Kitchen.. 1 at bedtime as needed 7)  Celebrex 200 Mg Caps (Celecoxib) .Marland Kitchen.. 1 two times a day as needed 8)  Levothyroxine Sodium 25 Mcg  Tabs (Levothyroxine sodium) .Marland Kitchen.. 1 once daily 9)  Crestor 10 Mg Tabs (Rosuvastatin calcium) .Marland Kitchen.. 1 pill m, w,f & sun  Lipid Assessment/Plan:      Based on NCEP/ATP III, the patient's risk factor category is "history of coronary disease, peripheral vascular disease, cerebrovascular disease, or aortic aneurysm".  The patient's lipid goals are as follows: Total cholesterol goal is 200; LDL cholesterol goal is 100; HDL cholesterol goal is 40; Triglyceride goal is 150.  Her LDL cholesterol goal has not been met.  Secondary causes for hyperlipidemia have been ruled out.  She has been counseled on adjunctive measures for lowering her cholesterol and has been provided with dietary instructions.    Patient Instructions: 1)  Please schedule a follow-up appointment in 4 months. 2)  Hepatic Panel prior to visit, ICD-9:995.20 3)  Lipid Panel prior to visit, ICD-9:272.4                                                     4)  CBC w/ Diff & B12  prior to visit, ICD-9:281.1 Prescriptions: CRESTOR 10 MG TABS (ROSUVASTATIN CALCIUM) 1 pill M, W,F & Sun  #30 x 5   Entered and Authorized by:   Marga Melnick MD   Signed by:   Marga Melnick MD on 06/27/2010   Method used:   Print then Give to Patient   RxID:   [id number]    Medication Administration  Injection # 1:    Medication: Vit B12 1000 mcg    Diagnosis: ANEMIA, B12 DEFICIENCY (ICD-281.1)    Route: IM    Site: R deltoid    Exp Date: 01/2012    Lot #: [id number]    Mfr: [employer]    Patient tolerated injection without complications    Given by: Shonna Chock CMA (June 27, 2010 3:43 PM)  Orders Added: 1)  Vit B12 1000 mcg [J3420] 2)  Admin of Therapeutic Inj  intramuscular or subcutaneous [96372] 3)  Est. Patient Level III [84132]

## 2010-12-26 NOTE — Assessment & Plan Note (Signed)
Summary: F/U ON LAB WORK/RH............Marland Kitchen   Vital Signs:  Patient profile:   77 year old female Weight:      96.2 pounds Pulse rate:   64 / minute Resp:     15 per minute BP sitting:   110 / 70  (left arm) Cuff size:   regular  Vitals Entered By: Shonna Chock (Apr 04, 2010 3:39 PM) CC: Follow-up visit: Discuss Labs (copy given) Comments REVIEWED MED LIST, PATIENT AGREED DOSE AND INSTRUCTION CORRECT    Primary Care Provider:  Marga Melnick, M.D.  CC:  Follow-up visit: Discuss Labs (copy given).  History of Present Illness: Labs reviewed & risks discussed ; lipids are @ goal but copay for Crestor 10 mg is $60. Lipitor would cost significantly less on her [facility] plan. No specific diet; CVE 1-2X/week.  Allergies (verified): No Known Drug Allergies  Review of Systems CV:  Denies chest pain or discomfort, leg cramps with exertion, palpitations, and shortness of breath with exertion.  Physical Exam  General:  Thin; alert,appropriate and cooperative throughout examination Lungs:  Normal respiratory effort, chest expands symmetrically. Lungs are clear to auscultation, no crackles or wheezes. Heart:  regular rhythm, no murmur, no gallop, no rub, no JVD, and bradycardia.  S4 Pulses:  R and L carotid,radial,dorsalis pedis and posterior tibial pulses are full and equal bilaterally Psych:  Oriented X3, memory intact for recent and remote, normally interactive, and good eye contact.     Impression & Recommendations:  Problem # 1:  HYPERLIPIDEMIA (ICD-272.2)  excellent results with Crestor but $60 copay  Her updated medication list for this problem includes:    Lipitor 20 Mg Tabs (Atorvastatin calcium) .Marland Kitchen... 1 pill m,w,f  Complete Medication List: 1)  Premarin 0.625 Mg/gm Crea (Estrogens, conjugated) .Marland Kitchen.. 1 tablet by mouth once daily days 1-25 2)  Vitamin D 1000 Unit Tabs (Cholecalciferol) .Marland Kitchen.. 1tablet by mouth once daily 3)  Norethindrone 5mg   .... Days 16-27 4)  Aspirin 81 Mg Tbec  (Aspirin) .Marland Kitchen.. 1 tablet by mouth once daily 5)  Vitamin D 400 Unit Caps (Cholecalciferol) .... 2 capsules by mouth once daily 6)  Cyclobenzaprine Hcl 5 Mg Tabs (Cyclobenzaprine hcl) .Marland Kitchen.. 1 at bedtime as needed 7)  Celebrex 200 Mg Caps (Celecoxib) .Marland Kitchen.. 1 two times a day as needed 8)  Levothyroxine Sodium 25 Mcg Tabs (Levothyroxine sodium) .Marland Kitchen.. 1 once daily 9)  Lipitor 20 Mg Tabs (Atorvastatin calcium) .Marland Kitchen.. 1 pill m,w,f  Other Orders: Vit B12 1000 mcg (J3420) Admin of Therapeutic Inj  intramuscular or subcutaneous (16109)  Patient Instructions: 1)  Fasting labs after 10 weeks of Lipitor 20 mg M,W, & F.(995.20,272.4) Prescriptions: LIPITOR 20 MG TABS (ATORVASTATIN CALCIUM) 1 pill M,W,F  #30 x 1   Entered and Authorized by:   Marga Melnick MD   Signed by:   Marga Melnick MD on 04/04/2010   Method used:   Print then Give to Patient   RxID:   [id number]    Medication Administration  Injection # 1:    Medication: Vit B12 1000 mcg    Diagnosis: ANEMIA, B12 DEFICIENCY (ICD-281.1)    Route: IM    Site: R deltoid    Exp Date: 12/2011    Lot #: [id number]    Mfr: [employer]    Patient tolerated injection without complications    Given by: Shonna Chock (Apr 04, 2010 3:40 PM)  Orders Added: 1)  Vit B12 1000 mcg [J3420] 2)  Admin of Therapeutic Inj  intramuscular or subcutaneous [95621]  3)  Est. Patient Level III [30865]

## 2010-12-26 NOTE — Assessment & Plan Note (Signed)
Summary: b-12 inj/kb  Nurse Visit  CC: B-12 inj./kb   Allergies: 1)  ! Lipitor  Medication Administration  Injection # 1:    Medication: Vit B12 1000 mcg    Diagnosis: ANEMIA, B12 DEFICIENCY (ICD-281.1)    Route: IM    Site: R deltoid    Exp Date: 01/23/2012    Lot #: [id number]    Mfr: [employer]ent    Patient tolerated injection without complications    Given by: Lucious Groves CMA (December 20, 2010 3:48 PM)  Orders Added: 1)  Vit B12 1000 mcg [J3420] 2)  Admin of Therapeutic Inj  intramuscular or subcutaneous [09811]

## 2010-12-26 NOTE — Assessment & Plan Note (Signed)
Summary: B-12 SHOT///SPH  Nurse Visit  CC: B-12 inj./kb   Allergies: 1)  ! Lipitor  Medication Administration  Injection # 1:    Medication: Vit B12 1000 mcg    Diagnosis: ANEMIA, B12 DEFICIENCY (ICD-281.1)    Route: IM    Site: R deltoid    Exp Date: 01/23/2012    Lot #: [id number]    Mfr: [employer] Regent    Patient tolerated injection without complications    Given by: Lucious Groves CMA (September 17, 2010 4:26 PM)  Orders Added: 1)  Vit B12 1000 mcg [J3420] 2)  Admin of Therapeutic Inj  intramuscular or subcutaneous [04540]

## 2010-12-26 NOTE — Assessment & Plan Note (Signed)
Summary: 4 MONTH CHECKUP, DISCUSS LABS////SPH   Vital Signs:  Patient profile:   77 year old female Weight:      99.8 pounds BMI:     19.56 Temp:     97.7 degrees F oral Pulse rate:   64 / minute Resp:     15 per minute BP sitting:   110 / 60  (left arm) Cuff size:   regular  Vitals Entered By: Shonna Chock CMA (October 31, 2010 3:25 PM) CC: 4 month follow-up (copy of labs given)    Primary Care Provider:  Marga Melnick, M.D.  CC:  4 month follow-up (copy of labs given) .  History of Present Illness: Hyperlipidemia Follow-Up      This is a 77 year old woman who presents for Hyperlipidemia follow-up.  The patient denies muscle aches, GI upset, abdominal pain, flushing, itching, constipation, diarrhea, and fatigue.  The patient denies the following symptoms: chest pain/pressure, exercise intolerance, dypsnea, palpitations, syncope, and pedal edema.  Compliance with medications (by patient report) has been near 100%.  Dietary compliance has been good.  The patient reports exercising daily.  Adjunctive measures currently used by the patient include ASA.   Lipids are  @ goal.  Current Medications (verified): 1)  Premarin 0.625 Mg/gm  Crea (Estrogens, Conjugated) .Marland Kitchen.. 1 Tablet By Mouth Once Daily Days 1-25 2)  Vitamin D 1000 Unit  Tabs (Cholecalciferol) .Marland Kitchen.. 1tablet By Mouth Once Daily 3)  Norethindrone 5mg  .... Days 16-27 4)  Aspirin 81 Mg  Tbec (Aspirin) .Marland Kitchen.. 1 Tablet By Mouth Once Daily 5)  Vitamin D 400 Unit  Caps (Cholecalciferol) .... 2 Capsules By Mouth Once Daily 6)  Celebrex 200 Mg Caps (Celecoxib) .Marland Kitchen.. 1 Two Times A Day As Needed 7)  Levothyroxine Sodium 25 Mcg Tabs (Levothyroxine Sodium) .Marland Kitchen.. 1 Once Daily 8)  Crestor 10 Mg Tabs (Rosuvastatin Calcium) .Marland Kitchen.. 1 Pill M, W,f & Sun  Allergies: 1)  ! Lipitor  Physical Exam  General:   appears younger than age; alert,appropriate and cooperative throughout examination Lungs:  Normal respiratory effort, chest expands  symmetrically. Lungs are clear to auscultation, no crackles or wheezes. Heart:  Normal rate and regular rhythm. S1 and S2 normal without gallop, murmur, click, rub. S4 Pulses:  R and L carotid,radial,dorsalis pedis and posterior tibial pulses are full and equal bilaterally Extremities:  No clubbing, cyanosis, edema.     Impression & Recommendations:  Problem # 1:  HYPERLIPIDEMIA (ICD-272.2) Lipids @ goal Her updated medication list for this problem includes:    Crestor 10 Mg Tabs (Rosuvastatin calcium) .Marland Kitchen... 1 pill m, w,f  Problem # 2:  ANEMIA, B12 DEFICIENCY (ICD-281.1) B12 low normal  Complete Medication List: 1)  Premarin 0.625 Mg/gm Crea (Estrogens, conjugated) .Marland Kitchen.. 1 tablet by mouth once daily days 1-25 2)  Vitamin D 1000 Unit Tabs (Cholecalciferol) .Marland Kitchen.. 1tablet by mouth once daily 3)  Norethindrone 5mg   .... Days 16-27 4)  Aspirin 81 Mg Tbec (Aspirin) .Marland Kitchen.. 1 tablet by mouth once daily 5)  Vitamin D 400 Unit Caps (Cholecalciferol) .... 2 capsules by mouth once daily 6)  Celebrex 200 Mg Caps (Celecoxib) .Marland Kitchen.. 1 two times a day as needed 7)  Levothyroxine Sodium 25 Mcg Tabs (Levothyroxine sodium) .Marland Kitchen.. 1 once daily 8)  Crestor 10 Mg Tabs (Rosuvastatin calcium) .Marland Kitchen.. 1 pill m, w,f  Patient Instructions: 1)  Continue B12 shots.  2)  Please schedule a follow-up appointment in 6 months. B12 level; 3)  Hepatic Panel ; 4)  Lipid Panel ; &  5)  TSH prior to visit. Prescriptions: CRESTOR 10 MG TABS (ROSUVASTATIN CALCIUM) 1 pill M, W,F  #30 x 1   Entered and Authorized by:   Marga Melnick MD   Signed by:   Marga Melnick MD on 10/31/2010   Method used:   Print then Give to Patient   RxID:   [id number]    Orders Added: 1)  Est. Patient Level III [01027]

## 2011-01-16 ENCOUNTER — Ambulatory Visit (INDEPENDENT_AMBULATORY_CARE_PROVIDER_SITE_OTHER): Payer: Medicare Other

## 2011-01-16 ENCOUNTER — Encounter: Payer: Self-pay | Admitting: Internal Medicine

## 2011-01-16 DIAGNOSIS — D518 Other vitamin B12 deficiency anemias: Secondary | ICD-10-CM

## 2011-01-21 NOTE — Assessment & Plan Note (Signed)
Summary: b12/ok per kelly/kn  Nurse Visit   Allergies: 1)  ! Lipitor  Medication Administration  Injection # 1:    Medication: Vit B12 1000 mcg    Diagnosis: ANEMIA, B12 DEFICIENCY (ICD-281.1)    Route: IM    Site: RUOQ gluteus    Exp Date: 01/23/2012    Lot #: [id number]    Mfr: American Regent    Given by: Doristine Devoid CMA (January 16, 2011 2:05 PM)  Orders Added: 1)  Vit B12 1000 mcg [J3420] 2)  Admin of Therapeutic Inj  intramuscular or subcutaneous [96372]   Medication Administration  Injection # 1:    Medication: Vit B12 1000 mcg    Diagnosis: ANEMIA, B12 DEFICIENCY (ICD-281.1)    Route: IM    Site: RUOQ gluteus    Exp Date: 01/23/2012    Lot #: [id number]    Mfr: American Regent    Given by: Doristine Devoid CMA (January 16, 2011 2:05 PM)  Orders Added: 1)  Vit B12 1000 mcg [J3420] 2)  Admin of Therapeutic Inj  intramuscular or subcutaneous [[id number]]

## 2011-02-01 ENCOUNTER — Encounter: Payer: Self-pay | Admitting: Internal Medicine

## 2011-02-18 ENCOUNTER — Encounter: Payer: Self-pay | Admitting: Internal Medicine

## 2011-02-19 ENCOUNTER — Ambulatory Visit (INDEPENDENT_AMBULATORY_CARE_PROVIDER_SITE_OTHER): Payer: Medicare Other | Admitting: Internal Medicine

## 2011-02-19 ENCOUNTER — Encounter: Payer: Self-pay | Admitting: Internal Medicine

## 2011-02-19 VITALS — BP 110/60 | HR 62 | Temp 98.2°F | Wt 99.0 lb

## 2011-02-19 DIAGNOSIS — Z8612 Personal history of poliomyelitis: Secondary | ICD-10-CM

## 2011-02-19 DIAGNOSIS — R2681 Unsteadiness on feet: Secondary | ICD-10-CM

## 2011-02-19 DIAGNOSIS — R269 Unspecified abnormalities of gait and mobility: Secondary | ICD-10-CM

## 2011-02-19 DIAGNOSIS — R2689 Other abnormalities of gait and mobility: Secondary | ICD-10-CM

## 2011-02-19 NOTE — Patient Instructions (Signed)
Please pursue the Tai Chi classes as we discussed. If your  balance and gait symptoms persist or progress, I do recommend a neurologic referral.

## 2011-02-19 NOTE — Progress Notes (Signed)
  Subjective:    Patient ID: Elizabeth [name], female    DOB: 08-13-1931, 77 y.o.   MRN: [id number]  HPI  she is here to have a Handicapped  parking form filled out. Her foot specialist  deferred  this  to her primary care doctor  She feels that she meets the criteria in that she cannot walk > 200 feet without stopping to rest and is very limited in her ability to walk due to an arthritic/ orthopedic condition.  Remotely she was told that there is asymmetry in the length of her legs; this is related to a slight case of polio she had as a child.  Additionally she is having difficulty with balance. The  imbalance is not related to any neuro or cardiac triggers. She has not fallen in > 10 years.  She has attempted to address these issues by working out on a regular basis at the gym. She has been working out with a Psychologist, educational and he has instructed her in some exercises.      Review of Systems     Objective:   Physical Exam  on exam she appears younger than her stated age; she is thin but well-nourished  She has no carotid bruit; the heart rhythm is regular without significant murmurs or gallops.  She does have significant osteoarthritic changes in her hands. Strength is good in upper or lower extremities.  There is asymmetry in the knee reflexes with the right being  1&1/2+ on the left 3/4-1+. She walks with somewhat short choppy steps; arm swing is slightly reduced but  essentially normal.    Assessment & Plan:  #1 musculoskeletal limitations related to remote history of polio  #2 subjective imbalance  Plan: #1 handicapped parking form was completed  #2 [facility] This has been proven to be very valuable in  helping balance. If the gait  and balance issues do not improve with this; I would recommend neurological evaluation of the gait.

## 2011-03-27 ENCOUNTER — Ambulatory Visit (INDEPENDENT_AMBULATORY_CARE_PROVIDER_SITE_OTHER): Payer: Medicare Other | Admitting: *Deleted

## 2011-03-27 DIAGNOSIS — E538 Deficiency of other specified B group vitamins: Secondary | ICD-10-CM

## 2011-03-27 MED ORDER — CYANOCOBALAMIN 1000 MCG/ML IJ SOLN
1000.0000 ug | Freq: Once | INTRAMUSCULAR | Status: AC
  Administered 2011-03-27: 1000 ug via INTRAMUSCULAR

## 2011-04-11 NOTE — Op Note (Signed)
[place]  Patient:    Elizabeth [name], Elizabeth [name]                           MRN: [id number] Proc. Date: 03/27/00 Adm. Date:  [id number] Attending:  Ivor Messier                           Operative Report  PREOPERATIVE DIAGNOSIS: Senile nuclear cataract, left eye.  POSTOPERATIVE DIAGNOSIS: Senile nuclear cataract, left eye.  OPERATION/PROCEDURE: Planned extracapsular cataract extraction with primary insertion of posterior chamber intraocular lens implant.  SURGEON: Guadelupe Sabin, M.D.  ASSISTANT: Nurse.  ANESTHESIA: Local 4% xylocaine, 0.75% Marcaine. Anesthesia standby.  Patient given sodium Ditropan during the period of retrobulbar injection.  Topical tetracaine and intraocular xylocaine were also utilized.  Anesthesia standby required in this elderly patient.  DESCRIPTION OF PROCEDURE: After the patient was prepped and draped a lid speculum was inserted in the left eye.  The eye was turned downward and a superior rectus traction suture placed.  Schiotz tenometry was recorded at 7 scale units with a 5.5 g weight.  A peritomy was performed adjacent to the limbus from the 11 to 1 oclock position.  The corneoscleral junction was cleaned and a corneoscleral groove made with a 45 degree Superblade.  The anterior chamber was then entered with the 2.5 mm diamond keratome at the 12 oclock position and the 15 degree blade at the 2:30 position.  Using a bent 26 gauge needle on a Healon syringe a circular capsulorrhexis was performed. Hydrodissection and hydrodelineation were performed using 1% xylocaine.  A 30 degree phacoemulsification tip was inserted with slow emulsification of the lens nucleus.  Total ultrasonic time was 56 seconds. Average power level was 16%.  Total amount of fluid used during emulsification was 35 cc.  Following removal of nucleus the irrigation-aspiration tip 0.3 mm was used to aspirate the residual cortex.  The posterior  capsule appeared intact with a brilliant red fundus reflex.  It was therefore elected to insert an [employer] [id number] silicone posterior chamber intraocular lens implant.  Diopter strength was +18.50.  This was inserted folded with a McDonald forceps into the anterior chamber, opened, and then centered into the capsular bag using the [employer] rotator.  The lens appeared to be well centered.  The Healon which had been using during the procedure was then aspirated and replaced with balanced salt solution with Miochol ophthalmic solution added.  The operative incisions appeared to be self-sealing and no sutures were required.  Pilocarpine ophthalmic gel was then instilled in the conjunctival cul-de-sac and a light patch and protective shield applied. Duration of the procedure and anesthesia administration was 45 minutes.  The patient tolerated the procedure well in general and left the operating room for the recovery room in good condition. DD:  03/27/00 TD:  03/27/00 Job: [id number]

## 2011-04-11 NOTE — H&P (Signed)
[place]  Patient:    Elizabeth [name], Elizabeth [name]                           MRN: [id number] Adm. Date:  [id number] Attending:  Ivor Messier                         History and Physical  CHIEF COMPLAINT: This was a planned outpatient surgical admission of this 77 year old white female, admitted for cataract implant surgery of the left eye.  HISTORY OF PRESENT ILLNESS: This patient was initially seen in my office in 1975 for routine eye examination.  Initial examination at that time revealed a visual acuity of 20/30 right eye, 20/25 left eye, and essentially normal ocular examination.  Over the ensuing years the patient has been followed intermittently but recently returned to the office complaining of blurred vision, particularly in the left eye.  Examination revealed deterioration of vision to 20/60- with best correction and nuclear cataract formation.  It was felt that the patient would benefit from cataract implant surgery.  She was given oral discussion and printed information concerning the surgery and its complications.  She signed an informed consent and arrangements were made for her outpatient admission at this time.  PAST MEDICAL HISTORY: The patient is in good general health, under the care of Dr. Alwyn Ren.  MEDICATIONS:  1. Premarin.  2. Provera.  3. Synthroid.  REVIEW OF SYSTEMS: No cardiorespiratory complaints.  PHYSICAL EXAMINATION:  GENERAL: The patient is a pleasant, well-developed, well-nourished 77 year old white female, in no acute ocular distress.  HEENT: Eyes show external ocular and slit lamp examination revealing nuclear cataract formation.  Applanation tenometry 10 mm each eye.  Visual acuity with best correction 20/25 -2 right eye, 20/60 -2 left eye.  Funduscopic examination reveals a clear vitreous, attached retina, with normal optic nerve, blood vessels, and macula.  CHEST: Lungs clear to auscultation and  percussion.  HEART: Normal sinus rhythm.  No cardiomegaly.  No murmurs.  ABDOMEN: Negative.  EXTREMITIES: Negative.  ADMISSION DIAGNOSIS: Senile cataract, both eyes, left eye greater than right.  SURGICAL PLAN:  Planned extracapsular cataract extraction with primary insertion of posterior chamber intraocular lens implant, left eye now and right eye later. DD:  03/27/00 TD:  03/27/00 Job: [id number]

## 2011-04-17 ENCOUNTER — Ambulatory Visit (INDEPENDENT_AMBULATORY_CARE_PROVIDER_SITE_OTHER): Payer: Medicare Other | Admitting: *Deleted

## 2011-04-17 DIAGNOSIS — D518 Other vitamin B12 deficiency anemias: Secondary | ICD-10-CM

## 2011-04-17 DIAGNOSIS — D519 Vitamin B12 deficiency anemia, unspecified: Secondary | ICD-10-CM

## 2011-04-17 MED ORDER — CYANOCOBALAMIN 1000 MCG/ML IJ SOLN
1000.0000 ug | Freq: Once | INTRAMUSCULAR | Status: AC
  Administered 2011-04-17: 1000 ug via INTRAMUSCULAR

## 2011-05-01 ENCOUNTER — Other Ambulatory Visit (INDEPENDENT_AMBULATORY_CARE_PROVIDER_SITE_OTHER): Payer: Medicare Other

## 2011-05-01 DIAGNOSIS — D518 Other vitamin B12 deficiency anemias: Secondary | ICD-10-CM

## 2011-05-01 DIAGNOSIS — E785 Hyperlipidemia, unspecified: Secondary | ICD-10-CM

## 2011-05-01 LAB — LIPID PANEL
Cholesterol: 173 mg/dL (ref 0–200)
LDL Cholesterol: 102 mg/dL — ABNORMAL HIGH (ref 0–99)
Total CHOL/HDL Ratio: 3

## 2011-05-01 LAB — HEPATIC FUNCTION PANEL
ALT: 23 U/L (ref 0–35)
AST: 29 U/L (ref 0–37)
Alkaline Phosphatase: 53 U/L (ref 39–117)
Total Bilirubin: 0.9 mg/dL (ref 0.3–1.2)

## 2011-05-01 LAB — TSH: TSH: 4.4 u[IU]/mL (ref 0.35–5.50)

## 2011-05-01 NOTE — Progress Notes (Signed)
Labs only

## 2011-05-08 ENCOUNTER — Ambulatory Visit: Payer: Self-pay | Admitting: Internal Medicine

## 2011-05-14 ENCOUNTER — Encounter: Payer: Self-pay | Admitting: Internal Medicine

## 2011-05-15 ENCOUNTER — Ambulatory Visit: Payer: Medicare Other

## 2011-05-15 ENCOUNTER — Ambulatory Visit (INDEPENDENT_AMBULATORY_CARE_PROVIDER_SITE_OTHER): Payer: Medicare Other | Admitting: Internal Medicine

## 2011-05-15 ENCOUNTER — Encounter: Payer: Self-pay | Admitting: Internal Medicine

## 2011-05-15 ENCOUNTER — Other Ambulatory Visit: Payer: Self-pay | Admitting: Internal Medicine

## 2011-05-15 ENCOUNTER — Telehealth: Payer: Self-pay | Admitting: Internal Medicine

## 2011-05-15 DIAGNOSIS — M949 Disorder of cartilage, unspecified: Secondary | ICD-10-CM

## 2011-05-15 DIAGNOSIS — E039 Hypothyroidism, unspecified: Secondary | ICD-10-CM

## 2011-05-15 DIAGNOSIS — D518 Other vitamin B12 deficiency anemias: Secondary | ICD-10-CM

## 2011-05-15 DIAGNOSIS — E785 Hyperlipidemia, unspecified: Secondary | ICD-10-CM

## 2011-05-15 DIAGNOSIS — M899 Disorder of bone, unspecified: Secondary | ICD-10-CM

## 2011-05-15 DIAGNOSIS — E782 Mixed hyperlipidemia: Secondary | ICD-10-CM

## 2011-05-15 MED ORDER — ROSUVASTATIN CALCIUM 10 MG PO TABS: 10.0000 mg | ORAL_TABLET | ORAL | Status: DC

## 2011-05-15 MED ORDER — LEVOTHYROXINE SODIUM 25 MCG PO TABS: 25.0000 ug | ORAL_TABLET | Freq: Every day | ORAL | Status: DC

## 2011-05-15 NOTE — Telephone Encounter (Signed)
Ok, 272.4

## 2011-05-15 NOTE — Patient Instructions (Signed)
Please  schedule  TSH in 4 months(244.9)

## 2011-05-15 NOTE — Progress Notes (Signed)
  Subjective:    Patient ID: Elizabeth [name], female    DOB: 24-Jul-1931, 77 y.o.   MRN: [id number]  HPI Dyslipidemia assessment: Prior Advanced Lipid Testing: NMR Lipoprofile.   Family history of premature CAD/ MI: MGF MI in 48s .  Nutrition: heart healthy .  Exercise: daily; gym 2 X/ week . Diabetes : no . HTN: no. Smoking history  : never .   Weight :  stable. ROS: fatigue: no ; chest pain : no ;claudication: no; palpitations: no; abd pain/bowel changes: no ; myalgias:no;  syncope : no ; memory loss: no;skin changes: no. Lab results reviewed :LDL 102,HDL 51.   Thyroid function monitor  Medications status(change in dose/brand/mode of administration):no Constitutional:  Sleep pattern:good; Appetite:good  Visual change(blurred/diplopia/visual loss):no Hoarseness:no; Swallowing issues:no Cardiovascular: Palpitations:no; Racing:no; Irregularity:no Neuro: Numbness/tingling:no; Tremor:no Psych: Anxiety:no; Depression:no; Panic attacks:no Endo: Temperature intolerance: Heat: no; Cold:no Labs:TSH 4.40    Review of Systems  No melena or BRRB     Objective:   Physical Exam Gen.:Thin but healthy and well-nourished in appearance. Alert, appropriate and cooperative throughout exam.Appears younger than age Head: Normocephalic without obvious abnormalities Neck: No deformities, masses, or tenderness noted. Range of motion &. Thyroid normal. Lungs: Normal respiratory effort; chest expands symmetrically. Lungs are clear to auscultation without rales, wheezes, or increased work of breathing. Heart: Slow rate and regular  rhythm. Normal S1 and S2. No gallop, click, or rub. No murmur. Abdomen: Bowel sounds normal; abdomen soft and nontender. No masses, organomegaly or hernias noted. Musculoskeletal/extremities: No deformity or scoliosis noted of  the thoracic or lumbar spine. No clubbing, cyanosis, edema.Tone & strength  Normal.Joints: marked OA finger changes. Nail health  good. Vascular: Carotid,  radial artery, dorsalis pedis and dorsalis posterior tibial pulses are full and equal. No bruits present. Neurologic: Alert and oriented x3. Deep tendon reflexes symmetrical and normal.          Skin: Intact without suspicious lesions or rashes. Lymph: No cervical, axillary, or inguinal lymphadenopathy present. Psych: Mood and affect are normal. Normally interactive                                                                                         Assessment & Plan:  See Problem List & assessments Plan: see Orders

## 2011-05-16 LAB — VITAMIN D 25 HYDROXY (VIT D DEFICIENCY, FRACTURES): Vit D, 25-Hydroxy: 50 ng/mL (ref 30–89)

## 2011-06-18 ENCOUNTER — Ambulatory Visit (INDEPENDENT_AMBULATORY_CARE_PROVIDER_SITE_OTHER): Payer: Medicare Other | Admitting: *Deleted

## 2011-06-18 DIAGNOSIS — E538 Deficiency of other specified B group vitamins: Secondary | ICD-10-CM

## 2011-06-18 MED ORDER — CYANOCOBALAMIN 1000 MCG/ML IJ SOLN
1000.0000 ug | Freq: Once | INTRAMUSCULAR | Status: AC
  Administered 2011-06-18: 1000 ug via INTRAMUSCULAR

## 2011-07-23 ENCOUNTER — Ambulatory Visit (INDEPENDENT_AMBULATORY_CARE_PROVIDER_SITE_OTHER): Payer: Medicare Other | Admitting: *Deleted

## 2011-07-23 DIAGNOSIS — E538 Deficiency of other specified B group vitamins: Secondary | ICD-10-CM

## 2011-07-23 MED ORDER — CYANOCOBALAMIN 1000 MCG/ML IJ SOLN
1000.0000 ug | Freq: Once | INTRAMUSCULAR | Status: AC
  Administered 2011-07-23: 1000 ug via INTRAMUSCULAR

## 2011-07-24 ENCOUNTER — Ambulatory Visit: Payer: Medicare Other

## 2011-08-06 ENCOUNTER — Other Ambulatory Visit: Payer: Self-pay | Admitting: Internal Medicine

## 2011-08-22 ENCOUNTER — Ambulatory Visit (INDEPENDENT_AMBULATORY_CARE_PROVIDER_SITE_OTHER): Payer: Medicare Other

## 2011-08-22 DIAGNOSIS — D518 Other vitamin B12 deficiency anemias: Secondary | ICD-10-CM

## 2011-08-22 DIAGNOSIS — D519 Vitamin B12 deficiency anemia, unspecified: Secondary | ICD-10-CM

## 2011-08-22 MED ORDER — CYANOCOBALAMIN 1000 MCG/ML IJ SOLN
1000.0000 ug | Freq: Once | INTRAMUSCULAR | Status: AC
  Administered 2011-08-22: 1000 ug via INTRAMUSCULAR

## 2011-09-17 ENCOUNTER — Other Ambulatory Visit: Payer: Self-pay | Admitting: Internal Medicine

## 2011-09-17 DIAGNOSIS — E039 Hypothyroidism, unspecified: Secondary | ICD-10-CM

## 2011-09-17 DIAGNOSIS — E785 Hyperlipidemia, unspecified: Secondary | ICD-10-CM

## 2011-09-18 ENCOUNTER — Other Ambulatory Visit: Payer: Medicare Other

## 2011-09-18 ENCOUNTER — Ambulatory Visit (INDEPENDENT_AMBULATORY_CARE_PROVIDER_SITE_OTHER): Payer: Medicare Other

## 2011-09-18 ENCOUNTER — Other Ambulatory Visit (INDEPENDENT_AMBULATORY_CARE_PROVIDER_SITE_OTHER): Payer: Medicare Other

## 2011-09-18 DIAGNOSIS — D518 Other vitamin B12 deficiency anemias: Secondary | ICD-10-CM

## 2011-09-18 DIAGNOSIS — D519 Vitamin B12 deficiency anemia, unspecified: Secondary | ICD-10-CM

## 2011-09-18 DIAGNOSIS — E039 Hypothyroidism, unspecified: Secondary | ICD-10-CM

## 2011-09-18 DIAGNOSIS — E785 Hyperlipidemia, unspecified: Secondary | ICD-10-CM

## 2011-09-18 LAB — LIPID PANEL: Total CHOL/HDL Ratio: 3

## 2011-09-18 LAB — TSH: TSH: 2.24 u[IU]/mL (ref 0.35–5.50)

## 2011-09-18 MED ORDER — CYANOCOBALAMIN 1000 MCG/ML IJ SOLN
1000.0000 ug | Freq: Once | INTRAMUSCULAR | Status: AC
  Administered 2011-09-18: 1000 ug via INTRAMUSCULAR

## 2011-09-18 NOTE — Progress Notes (Signed)
Labs only

## 2011-10-22 ENCOUNTER — Encounter: Payer: Self-pay | Admitting: Internal Medicine

## 2011-10-24 ENCOUNTER — Encounter: Payer: Self-pay | Admitting: Internal Medicine

## 2011-10-24 ENCOUNTER — Ambulatory Visit (INDEPENDENT_AMBULATORY_CARE_PROVIDER_SITE_OTHER): Payer: Medicare Other | Admitting: Internal Medicine

## 2011-10-24 VITALS — BP 110/70 | HR 66 | Temp 98.7°F | Wt 98.6 lb

## 2011-10-24 DIAGNOSIS — D518 Other vitamin B12 deficiency anemias: Secondary | ICD-10-CM

## 2011-10-24 DIAGNOSIS — R32 Unspecified urinary incontinence: Secondary | ICD-10-CM

## 2011-10-24 DIAGNOSIS — D519 Vitamin B12 deficiency anemia, unspecified: Secondary | ICD-10-CM

## 2011-10-24 DIAGNOSIS — Z87898 Personal history of other specified conditions: Secondary | ICD-10-CM

## 2011-10-24 DIAGNOSIS — Z8709 Personal history of other diseases of the respiratory system: Secondary | ICD-10-CM

## 2011-10-24 MED ORDER — SOLIFENACIN SUCCINATE 5 MG PO TABS: 5.0000 mg | ORAL_TABLET | Freq: Every day | ORAL | Status: DC

## 2011-10-24 MED ORDER — CYANOCOBALAMIN 1000 MCG/ML IJ SOLN
1000.0000 ug | Freq: Once | INTRAMUSCULAR | Status: AC
  Administered 2011-10-24: 1000 ug via INTRAMUSCULAR

## 2011-10-24 NOTE — Progress Notes (Signed)
  Subjective:    Patient ID: Elizabeth [name], female    DOB: 1931/09/14, 77 y.o.   MRN: [id number]  HPI Incontinence: Onset: years ago Course:worse in past 6 months ; occuring daily; wearing Serenity pads & keeping paper towels in bed if needed Symptoms Urgency: yes,   Frequency: yes  Hesitancy: no  Hematuria: no  Flank Pain/ pyuria/hematuria: no  Fever: no     Nausea/Vomiting: no  Discharge: no LMP: scant regular  menses monthly; on HRT (estrogen & Provera) from Dr Jennette Kettle  [facility] of  1. DM: no 2. Renal Disease/Calculi: no 3. Urinary Tract Abnormality: no  4. Instrumentation/Trauma: no ,but bladder tacking remotely   Epistaxis: Onset: last Summer; NO recurrence since; she mentioned this only because she was  here. Trigger: sneezing  She denies any bleeding or clotting abnormalities. Specifically she denies hematemesis, hemoptysis, rectal bleeding, melena, or abnormal bruising.           Review of Systems     Objective:   Physical Exam Gen.:Thin but healthy and well-nourished in appearance. Alert, appropriate and cooperative throughout exam. Appears younger than age  Nose: External nasal exam reveals no deformity or inflammation. Nasal mucosa are pink and moist. No lesions or exudates noted.  Lungs: Normal respiratory effort; chest expands symmetrically. Lungs are clear to auscultation without rales, wheezes, or increased work of breathing. Heart: Normal rate and rhythm. Normal S1 and S2. No gallop, click, or rub. S 4 w/o   murmur. Abdomen: Bowel sounds normal; abdomen soft and nontender. No masses, organomegaly or hernias noted. The aorta is palpable but not enlarged in the prone bruit is present. There is no bladder distention.  Vascular: Carotid ,radial, & DPP  artery pulses are strong  and equal (verified). Slightly decreased PTP Neurologic: Alert and oriented x3.   Skin: Intact without suspicious lesions or rashes. Lymph: No cervical, axillary lymphadenopathy  present. Psych: Mood and affect are normal. Normally interactive                                                                                         Assessment & Plan:  #1 incontinence  #2 epistaxis  without evidence of bleeding dyscrasia. The most likely etiology was a broken vein and the septum from the ascites. Plan: See orders and recommendations

## 2011-10-24 NOTE — Patient Instructions (Signed)
Eucerin twice a day to nasal mucosa  as needed .

## 2011-10-24 NOTE — Progress Notes (Signed)
Addended by: Arnette Norris on: 10/24/2011 03:06 PM   Modules accepted: Orders

## 2011-11-10 ENCOUNTER — Telehealth: Payer: Self-pay | Admitting: Internal Medicine

## 2011-11-10 NOTE — Telephone Encounter (Signed)
This agent is associated with the least side effects of thisclass of drug. I do recommend a urology evaluation to optimally assess her symptoms and appropriate therapy in view of her intolerance to Vesicare.

## 2011-11-10 NOTE — Telephone Encounter (Signed)
Dr.Hopper please advise 

## 2011-11-10 NOTE — Telephone Encounter (Signed)
Patient started taking vesicare - she said it helped but caused her to itch - she wanted to know if she could take something else - [place]

## 2011-11-11 ENCOUNTER — Telehealth: Payer: Self-pay

## 2011-11-11 NOTE — Telephone Encounter (Signed)
Pt called back about her itching due to Vesicare? I saw Dr Caryl Never note in pts chart and read to her what MD said. Pt states she will wait to see Urology until the first of the year and will call back then.

## 2011-11-11 NOTE — Telephone Encounter (Signed)
Pt informed and will call back after the first of the year

## 2011-11-27 ENCOUNTER — Ambulatory Visit (INDEPENDENT_AMBULATORY_CARE_PROVIDER_SITE_OTHER): Payer: 59 | Admitting: *Deleted

## 2011-11-27 DIAGNOSIS — E538 Deficiency of other specified B group vitamins: Secondary | ICD-10-CM

## 2011-11-27 MED ORDER — CYANOCOBALAMIN 1000 MCG/ML IJ SOLN
1000.0000 ug | Freq: Once | INTRAMUSCULAR | Status: AC
  Administered 2011-11-27: 1000 ug via INTRAMUSCULAR

## 2011-12-26 ENCOUNTER — Ambulatory Visit (INDEPENDENT_AMBULATORY_CARE_PROVIDER_SITE_OTHER): Payer: 59 | Admitting: *Deleted

## 2011-12-26 DIAGNOSIS — E538 Deficiency of other specified B group vitamins: Secondary | ICD-10-CM

## 2011-12-26 MED ORDER — CYANOCOBALAMIN 1000 MCG/ML IJ SOLN
1000.0000 ug | Freq: Once | INTRAMUSCULAR | Status: AC
  Administered 2011-12-26: 1000 ug via INTRAMUSCULAR

## 2011-12-29 ENCOUNTER — Telehealth: Payer: Self-pay | Admitting: Internal Medicine

## 2011-12-29 NOTE — Telephone Encounter (Signed)
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 Patient stated we can disregard this message all together.

## 2011-12-29 NOTE — Telephone Encounter (Signed)
Patient states that she would rather have Dr. Alwyn Ren write her a rx for b12 instead of her coming in for b12 shots. Or, she would like to know how much vitamin b12 mg OTC she could get?

## 2011-12-29 NOTE — Telephone Encounter (Signed)
Patient states she received a bill for $ 60.10 for b12 injection, patient states she would like to continue getting B12 injections but not for that price. Patient was instructed to begin with her insurance company, patient to call see if anything has changed with on there end, if yes patient to ask about nasal B12 and call back. Patient is aware there is no OTC dose of B12 that compares with the B12 injection or that would have been made an option to her prior.

## 2012-01-21 ENCOUNTER — Ambulatory Visit (INDEPENDENT_AMBULATORY_CARE_PROVIDER_SITE_OTHER): Payer: 59

## 2012-01-21 DIAGNOSIS — D519 Vitamin B12 deficiency anemia, unspecified: Secondary | ICD-10-CM

## 2012-01-21 DIAGNOSIS — D518 Other vitamin B12 deficiency anemias: Secondary | ICD-10-CM

## 2012-01-21 MED ORDER — CYANOCOBALAMIN 1000 MCG/ML IJ SOLN
1000.0000 ug | Freq: Once | INTRAMUSCULAR | Status: AC
  Administered 2012-01-21: 1000 ug via INTRAMUSCULAR

## 2012-01-22 ENCOUNTER — Ambulatory Visit: Payer: 59

## 2012-02-11 ENCOUNTER — Ambulatory Visit (INDEPENDENT_AMBULATORY_CARE_PROVIDER_SITE_OTHER): Payer: 59

## 2012-02-11 ENCOUNTER — Ambulatory Visit: Payer: 59

## 2012-02-11 DIAGNOSIS — D518 Other vitamin B12 deficiency anemias: Secondary | ICD-10-CM

## 2012-02-11 DIAGNOSIS — D519 Vitamin B12 deficiency anemia, unspecified: Secondary | ICD-10-CM

## 2012-02-11 MED ORDER — CYANOCOBALAMIN 1000 MCG/ML IJ SOLN
1000.0000 ug | Freq: Once | INTRAMUSCULAR | Status: AC
  Administered 2012-02-11: 1000 ug via INTRAMUSCULAR

## 2012-02-12 ENCOUNTER — Ambulatory Visit: Payer: 59

## 2012-02-18 ENCOUNTER — Ambulatory Visit: Payer: 59

## 2012-02-20 ENCOUNTER — Ambulatory Visit: Payer: 59

## 2012-03-18 ENCOUNTER — Ambulatory Visit (INDEPENDENT_AMBULATORY_CARE_PROVIDER_SITE_OTHER): Payer: 59 | Admitting: *Deleted

## 2012-03-18 DIAGNOSIS — D518 Other vitamin B12 deficiency anemias: Secondary | ICD-10-CM

## 2012-03-18 MED ORDER — CYANOCOBALAMIN 1000 MCG/ML IJ SOLN
1000.0000 ug | Freq: Once | INTRAMUSCULAR | Status: AC
  Administered 2012-03-18: 1000 ug via INTRAMUSCULAR

## 2012-03-22 ENCOUNTER — Other Ambulatory Visit: Payer: Self-pay | Admitting: Internal Medicine

## 2012-03-22 NOTE — Telephone Encounter (Signed)
Rx sent 

## 2012-03-22 NOTE — Telephone Encounter (Signed)
Last OV 10/24/11, please advise on refill request

## 2012-03-22 NOTE — Telephone Encounter (Signed)
#   30 ; 1 qd prn only. This should not be a maintenance medication

## 2012-04-22 ENCOUNTER — Ambulatory Visit: Payer: 59

## 2012-04-23 ENCOUNTER — Ambulatory Visit (INDEPENDENT_AMBULATORY_CARE_PROVIDER_SITE_OTHER): Payer: 59 | Admitting: *Deleted

## 2012-04-23 ENCOUNTER — Encounter: Payer: Self-pay | Admitting: *Deleted

## 2012-04-23 DIAGNOSIS — E538 Deficiency of other specified B group vitamins: Secondary | ICD-10-CM

## 2012-04-23 MED ORDER — CYANOCOBALAMIN 1000 MCG/ML IJ SOLN
1000.0000 ug | Freq: Once | INTRAMUSCULAR | Status: AC
  Administered 2012-04-23: 1000 ug via INTRAMUSCULAR

## 2012-05-17 ENCOUNTER — Other Ambulatory Visit: Payer: Self-pay | Admitting: Internal Medicine

## 2012-05-20 ENCOUNTER — Ambulatory Visit: Payer: 59

## 2012-05-21 ENCOUNTER — Emergency Department (HOSPITAL_COMMUNITY)
Admission: EM | Admit: 2012-05-21 | Discharge: 2012-05-21 | Disposition: A | Discharge status: Home / Self Care | Payer: PRIVATE HEALTH INSURANCE | Attending: Emergency Medicine | Admitting: Emergency Medicine

## 2012-05-21 ENCOUNTER — Ambulatory Visit: Payer: 59

## 2012-05-21 ENCOUNTER — Emergency Department (HOSPITAL_COMMUNITY): Payer: PRIVATE HEALTH INSURANCE

## 2012-05-21 ENCOUNTER — Telehealth: Payer: Self-pay | Admitting: Internal Medicine

## 2012-05-21 ENCOUNTER — Encounter (HOSPITAL_COMMUNITY): Payer: Self-pay | Admitting: Emergency Medicine

## 2012-05-21 DIAGNOSIS — Y92009 Unspecified place in unspecified non-institutional (private) residence as the place of occurrence of the external cause: Secondary | ICD-10-CM | POA: Insufficient documentation

## 2012-05-21 DIAGNOSIS — M542 Cervicalgia: Secondary | ICD-10-CM | POA: Insufficient documentation

## 2012-05-21 DIAGNOSIS — E039 Hypothyroidism, unspecified: Secondary | ICD-10-CM | POA: Insufficient documentation

## 2012-05-21 DIAGNOSIS — W1809XA Striking against other object with subsequent fall, initial encounter: Secondary | ICD-10-CM | POA: Insufficient documentation

## 2012-05-21 DIAGNOSIS — E785 Hyperlipidemia, unspecified: Secondary | ICD-10-CM | POA: Insufficient documentation

## 2012-05-21 DIAGNOSIS — S0101XD Laceration without foreign body of scalp, subsequent encounter: Secondary | ICD-10-CM

## 2012-05-21 DIAGNOSIS — S0100XA Unspecified open wound of scalp, initial encounter: Secondary | ICD-10-CM | POA: Insufficient documentation

## 2012-05-21 DIAGNOSIS — Z7982 Long term (current) use of aspirin: Secondary | ICD-10-CM | POA: Insufficient documentation

## 2012-05-21 DIAGNOSIS — Z79899 Other long term (current) drug therapy: Secondary | ICD-10-CM | POA: Insufficient documentation

## 2012-05-21 NOTE — Discharge Instructions (Signed)
You have been evaluated for your recent fall. Head and neck CT scan shows no acute fracture or dislocation. You do have a laceration on your scalp. Please follow instruction to go for care. Please followup with your Dr. for further evaluation. Return if you have any concern.  Open Wound, Head An open wound is a break in the skin because of an injury. An open wound can be a scrape, cut, or puncture to the skin. Good wound care will help to:   Reduce pain.   Prevent infection.   Reduce scaring.  HOME CARE  Wash all dirt off the wound.   Clean your wound daily with gentle soap and water.   Wash your hair as you normally do.   Apply medicated cream after the wound has been cleaned as told by your doctor.   Apply a clean bandage (dressing) daily if needed.  GET HELP RIGHT AWAY IF:   There is increased redness or puffiness (swelling) in or around the wound.   Your pain increases.   You or your child has a temperature by mouth above 102 F (38.9 C), not controlled by medicine.   Your baby is older than 3 months with a rectal temperature of 102 F (38.9 C) or higher.   Your baby is 28 months old or younger with a rectal temperature of 100.4 F (38 C) or higher.   A yellowish white fluid (pus) comes from the wound.   Your pain is not controlled with pain medicine.   There is red streaking of the skin that goes above or below the wound.  MAKE SURE YOU:   Understand these instructions.   Will watch your condition.   Will get help right away if you are not doing well or get worse.  Document Released: 02/06/2009 Document Revised: 10/30/2011 Document Reviewed: 02/06/2009 [facility] Patient Information 2012 [place], [place].

## 2012-05-21 NOTE — ED Notes (Signed)
Pt c/o fall that occurred this morning around 3am while she was going to the restroom. States that she slipped, fell, and hit back of head on glass table. Denies LOC. States that she has a cut on the back of head. Denies headache, nausea and vomiting.

## 2012-05-21 NOTE — ED Provider Notes (Signed)
History     CSN: [id number]  Arrival date & time 05/21/12  1448   First MD Initiated Contact with Patient 05/21/12 1603      Chief Complaint  Patient presents with  . Fall    (Consider location/radiation/quality/duration/timing/severity/associated sxs/prior treatment) HPI  78 year old female with history of osteopenia and history of pernicious anemia presents for evaluation of a recent fall. Patient states she has a history of polio and has had trouble walking with her left leg secondary to polio. She has a special shoes that she usually wears the support. This a.m. (3am) she was getting up in the morning to use the bathroom and was walking barefooted. This caused her to lose her balance and she fell backward hitting her head against a glass table. Glass table did not breaks. She noticed bleeding from her head but denies any significant headache, neck pain, or loss of consciousness. She denies any numbness weakness, nausea, vomiting, vision changes. She denies any other precipitating symptoms. She was recommended by her doctor for an evaluation in the ED.  Past Medical History  Diagnosis Date  . Vitamin B12 deficiency   . Pernicious anemia   . Hypothyroidism   . Osteopenia     PMH fracture heel, foot bilat.  . Hyperlipidemia   . Abnormal Pap smear of vagina 11/07    Past Surgical History  Procedure Date  . Bladder suspension 2000    Family History  Problem Relation Age of Onset  . Breast cancer Maternal Grandmother   . Cancer Maternal Grandmother     breast  . Heart attack Maternal Grandfather     55s  . Emphysema Mother   . Hypertension Mother   . Alzheimer's disease Father   . Mental illness Father     alzheimers    History  Substance Use Topics  . Smoking status: Never Smoker   . Smokeless tobacco: Not on file  . Alcohol Use: Yes     wine    OB History    Grav Para Term Preterm Abortions TAB SAB Ect Mult Living                  Review of Systems  All  other systems reviewed and are negative.    Allergies  Atorvastatin  Home Medications   Current Outpatient Rx  Name Route Sig Dispense Refill  . ASPIRIN 81 MG PO TABS Oral Take 81 mg by mouth daily.      . CELECOXIB 200 MG PO CAPS Oral Take 1 capsule (200 mg total) by mouth daily as needed for pain. 30 each 0    not maintenance medication  . VITAMIN D 1000 UNITS PO TABS Oral Take 1,000 Units by mouth daily.      Marland Kitchen ESTROGENS CONJUGATED 0.625 MG PO TABS Oral Take 0.625 mg by mouth daily. Days 1-25     . NORETHINDRONE ACETATE 5 MG PO TABS Oral Take 5 mg by mouth as directed. Days 16-27     . ROSUVASTATIN CALCIUM 10 MG PO TABS Oral Take 1 tablet (10 mg total) by mouth as directed. 1 pill M,W,F 30 tablet 5  . SOLIFENACIN SUCCINATE 5 MG PO TABS Oral Take 1 tablet (5 mg total) by mouth daily. 30 tablet 2  . SYNTHROID 25 MCG PO TABS  TAKE 1 TABLET DAILY. 90 each 1  . VITAMIN E 400 UNITS PO CAPS Oral Take 800 Units by mouth daily.       BP 111/45  Pulse  63  Temp 98.2 F (36.8 C) (Oral)  Resp 14  SpO2 98%  Physical Exam  Nursing note and vitals reviewed. Constitutional: She is oriented to person, place, and time. She appears well-developed and well-nourished. No distress.       thin and frail-appearing  HENT:  Head: Normocephalic.  Right Ear: External ear normal.  Left Ear: External ear normal.  Mouth/Throat: Oropharynx is clear and moist. No oropharyngeal exudate.       1 cm superficial scalp laceration to left parietal region. Minimal tenderness on palpation. Not actively bleeding.  Eyes: Conjunctivae and EOM are normal. Pupils are equal, round, and reactive to light.  Neck: Normal range of motion. Neck supple.  Cardiovascular: Normal rate and regular rhythm.   Pulmonary/Chest: Effort normal. She exhibits no tenderness.  Abdominal: Soft. There is no tenderness.  Musculoskeletal: Normal range of motion. She exhibits no edema and no tenderness.  Neurological: She is alert and  oriented to person, place, and time. GCS eye subscore is 4. GCS verbal subscore is 5. GCS motor subscore is 6.       Mildly antalgic gait favoring R side, this is normal for her.  Normal strength to lower extremities.   Skin: Skin is warm. No rash noted.    ED Course  Procedures (including critical care time)  Labs Reviewed - No data to display No results found.   No diagnosis found.  Results for orders placed in visit on 09/18/11  LIPID PANEL      Component Value Range   Cholesterol 144  0 - 200 mg/dL   Triglycerides 78.2  0.0 - 149.0 mg/dL   HDL 95.62  >13.08 mg/dL   VLDL 65.7  0.0 - 84.6 mg/dL   LDL Cholesterol 71  0 - 99 mg/dL   Total CHOL/HDL Ratio 3    TSH      Component Value Range   TSH 2.24  0.35 - 5.50 uIU/mL   Ct Head Wo Contrast  05/21/2012  *RADIOLOGY REPORT*  Clinical Data:  Fall.  Evaluate injury to head and neck  CT HEAD WITHOUT CONTRAST CT CERVICAL SPINE WITHOUT CONTRAST  Technique:  Multidetector CT imaging of the head and cervical spine was performed following the standard protocol without intravenous contrast.  Multiplanar CT image reconstructions of the cervical spine were also generated.  Comparison:   None  CT HEAD  Findings: There is diffuse patchy low density throughout the subcortical and periventricular white matter consistent with chronic small vessel ischemic change.  There is prominence of the sulci and ventricles consistent with brain atrophy.  Encephalomalacia involving the right occipital lobe is similar to previous exam.  The chronic left basal ganglia lacunar infarct is noted and also appears similar to previous exam.  There is no evidence for acute brain infarct, hemorrhage or mass.  The paranasal sinuses and mastoid air cells are clear.  The skull is intact.  IMPRESSION:  1.  No acute intracranial abnormalities. 2.  Chronic small vessel ischemic disease and atrophy. 3.  Chronic right occipital infarct.  CT CERVICAL SPINE  Findings: Normal alignment of  the cervical spine.  The vertebral body heights are well preserved.  The vertebral body there is multilevel disc space narrowing and ventral spurring.  This is most severe at C5-6.  Bilateral facet hypertrophy and degenerative changes noted.  There is biapical scarring identified.  No fractures identified. Bilateral coronary artery calcifications are present.  IMPRESSION:  1.  No acute findings. 2.  Cervical spondylosis.  Original Report Authenticated By: Rosealee Albee, M.D.   Ct Cervical Spine Wo Contrast  05/21/2012  *RADIOLOGY REPORT*  Clinical Data:  Fall.  Evaluate injury to head and neck  CT HEAD WITHOUT CONTRAST CT CERVICAL SPINE WITHOUT CONTRAST  Technique:  Multidetector CT imaging of the head and cervical spine was performed following the standard protocol without intravenous contrast.  Multiplanar CT image reconstructions of the cervical spine were also generated.  Comparison:   None  CT HEAD  Findings: There is diffuse patchy low density throughout the subcortical and periventricular white matter consistent with chronic small vessel ischemic change.  There is prominence of the sulci and ventricles consistent with brain atrophy.  Encephalomalacia involving the right occipital lobe is similar to previous exam.  The chronic left basal ganglia lacunar infarct is noted and also appears similar to previous exam.  There is no evidence for acute brain infarct, hemorrhage or mass.  The paranasal sinuses and mastoid air cells are clear.  The skull is intact.  IMPRESSION:  1.  No acute intracranial abnormalities. 2.  Chronic small vessel ischemic disease and atrophy. 3.  Chronic right occipital infarct.  CT CERVICAL SPINE  Findings: Normal alignment of the cervical spine.  The vertebral body heights are well preserved.  The vertebral body there is multilevel disc space narrowing and ventral spurring.  This is most severe at C5-6.  Bilateral facet hypertrophy and degenerative changes noted.  There is biapical  scarring identified.  No fractures identified. Bilateral coronary artery calcifications are present.  IMPRESSION:  1.  No acute findings. 2.  Cervical spondylosis.  Original Report Authenticated By: Rosealee Albee, M.D.   1. Fall 2. Scalp laceration   MDM  Mechanical fall with scalp laceration.  Not actively bleeding, wound is greater 6 hrs.  Due to her hx of anemia and also recent trauma, will obtain head/neck ct and H&H.    5:38 PM Result neck head CT scan reveals no acute fractures or dislocation. Care instruction given.  Pt voice understanding and agrees with plan.  Pt in NAD      Fayrene Helper, PA-C 05/21/12 1745

## 2012-05-21 NOTE — ED Notes (Addendum)
Pt reports fall today around 0330.  Pt denies LOC although she did hit her head on a glass table.  Denies pain.  Denies syncope. Denies nausea/vomiting/headache.   States her feet got "tangled up."  dph

## 2012-05-21 NOTE — Telephone Encounter (Signed)
Caller: Elizabeth [name]/Patient; PCP: Marga Melnick; CB#: [phone]; Call regarding pt fell last night on 05/20/12 while going to [facility]; fell and hit head on the glass table; head was bleeding last night; no bleeding today; has a laceration approx size of a nickel;  Triaged per Head Injury Guideline; See in ED Immed d/t head injury and 60 yrs of age or older; pt will comply; will try to find a ride and if not will call 911; prefers [facility]; OFFICE PLEASE NOTE PT WAS SENT TO ER D/T RN FELT PT CAN NOT BE SEEN IN THE OFFICE D/T HEAD TRAUMA

## 2012-05-21 NOTE — ED Notes (Signed)
Pt alert & oriented.  Verbalizes understanding of d/c instructions. Ambulates out of dept with friend. dph

## 2012-05-26 ENCOUNTER — Ambulatory Visit: Payer: 59

## 2012-05-26 ENCOUNTER — Encounter: Payer: Self-pay | Admitting: Internal Medicine

## 2012-05-26 ENCOUNTER — Ambulatory Visit (INDEPENDENT_AMBULATORY_CARE_PROVIDER_SITE_OTHER): Payer: 59 | Admitting: Internal Medicine

## 2012-05-26 VITALS — BP 132/80 | HR 64 | Temp 97.6°F | Wt 92.0 lb

## 2012-05-26 DIAGNOSIS — D518 Other vitamin B12 deficiency anemias: Secondary | ICD-10-CM

## 2012-05-26 DIAGNOSIS — S0190XA Unspecified open wound of unspecified part of head, initial encounter: Secondary | ICD-10-CM

## 2012-05-26 DIAGNOSIS — S0191XA Laceration without foreign body of unspecified part of head, initial encounter: Secondary | ICD-10-CM

## 2012-05-26 DIAGNOSIS — Q740 Other congenital malformations of upper limb(s), including shoulder girdle: Secondary | ICD-10-CM

## 2012-05-26 DIAGNOSIS — M949 Disorder of cartilage, unspecified: Secondary | ICD-10-CM

## 2012-05-26 DIAGNOSIS — M899 Disorder of bone, unspecified: Secondary | ICD-10-CM

## 2012-05-26 DIAGNOSIS — I951 Orthostatic hypotension: Secondary | ICD-10-CM

## 2012-05-26 LAB — VITAMIN B12: Vitamin B-12: >2000 pg/mL — ABNORMAL HIGH (ref 211–911)

## 2012-05-26 LAB — CBC WITH DIFFERENTIAL/PLATELET
Basophils Relative: 0 % (ref 0–1)
HCT: 37.1 % (ref 36.0–46.0)
Hemoglobin: 12.3 g/dL (ref 12.0–15.0)
Lymphs Abs: 1.3 10*3/uL (ref 0.7–4.0)
MCH: 30.2 pg (ref 26.0–34.0)
MCHC: 33.2 g/dL (ref 30.0–36.0)
Monocytes Absolute: 0.4 10*3/uL (ref 0.1–1.0)
Monocytes Relative: 8 % (ref 3–12)
Neutro Abs: 2.7 10*3/uL (ref 1.7–7.7)
Neutrophils Relative %: 60 % (ref 43–77)
RBC: 4.07 MIL/uL (ref 3.87–5.11)

## 2012-05-26 MED ORDER — CYANOCOBALAMIN 1000 MCG/ML IJ SOLN
1000.0000 ug | Freq: Once | INTRAMUSCULAR | Status: AC
  Administered 2012-05-26: 1000 ug via INTRAMUSCULAR

## 2012-05-26 NOTE — Telephone Encounter (Signed)
Message copied by Marshell Garfinkel on Wed May 26, 2012 10:54 AM ------      Message from: Pecola Lawless      Created: Tue May 25, 2012 12:58 PM      Contact: [phone]       There are no recent blood counts in the chart. The doctor may have been concerned about the severity of the bleed or any skin  pallor. It would be cautious to check a complete blood count as the emergency room physician recommended      ----- Message -----         From: Marshell Garfinkel         Sent: 05/25/2012  11:53 AM           To: Pecola Lawless, MD            Pt does not have any sutures or staples. Pt states they have left the wound open.             ----- Message -----         From: Pecola Lawless, MD         Sent: 05/25/2012  11:51 AM           To: Marshell Garfinkel            Return when sutures need to be removed; blood count can be checked at that time      ----- Message -----         From: Marshell Garfinkel         Sent: 05/25/2012  11:16 AM           To: Pecola Lawless, MD            Pt was recently seen in ED and her discharge instructions tell her to call you for a follow up appointment. Pt states she will come in, but feels that her laceration is healing well. Her Dr. at the ED did mentioned to her that she may need to be checked for anemia. Pt just wanted to let you know and see if you wanted her to come into the office. Please advise.

## 2012-05-26 NOTE — Telephone Encounter (Signed)
Pt coming in today at 1:15p.

## 2012-05-26 NOTE — Progress Notes (Signed)
  Subjective:    Patient ID: Elizabeth [name], female    DOB: [birth year], 78 y.o.   MRN: [id number]  HPI 05/21/12 she was seen in the emergency room for a laceration of the left crown following falling at home. ER record reviewed ; no labs done. CT scan reviewed & discussed. She got out of bed between 3-4 AM to go the bathroom. Within seconds she had fallen and struck her head. She denies any cardiac or neurologic prodrome prior to the fall. She had no benign postural vertigo symptoms prior to standing. With standing she did not notice true vertigo or significant dizziness. The event was not related to pain or straining as  she had not reached the bathroom.  Since the fall she denies loss of consciousness or headache . There's been no sign of infection or complications on the laceration such as fever or purulent secretions.  Since the fall she is concerned that the right clavicle is larger than the left. There is no associated skin discoloration or bruising; pain; or temperature change.She did not injure the clavicle in the fall    Review of Systems after the evaluation by the emergency physician; she was told that everything had "been checked" except for anemia. She denies epistaxis, hemoptysis, rectal bleeding, melena, hematuria, or abnormal bruising or bleeding.  She does have a history of B12 deficiency with anemia.  She also has a history of TIA in the past     Objective:   Physical Exam   She is thin but appears well-nourished and in no acute distress. She appears younger than her stated age  Slight ptosis is present on the right. Extraocular motion and field of vision normal  There is no definite cranial nerve deficit  The scalp lesion has an eschar with no sign of cellulitis  Rhythm is regular with no significant murmurs or gallops  Chest is clear with no increased work of breathing  Deep tendon reflexes are normal and equal.  Strength and tone are normal. She does have  significant osteoarthritic changes in hands  Gait is slow and deliberate; there is slight decreased arm swing. Romberg and finger to nose testing normal.  There is not significant asymmetry in the clavicle's. The overlying skin is normal with no increased temperature or change in color. There's no tenderness to palpation over the clavicle.  She has no lymphadenopathy about the neck or axilla.  She is alert and oriented; she is anxious concerning the right clavicle         Assessment & Plan:  #1 fall with head laceration; history suggest that this is postural hypotension. Isometrics will be recommended prior to standing at night. #2 past history of B12 deficiency with anemia. CBC will be checked.  #3 subjective asymmetry in the clavicles without significant clinical physical findings. She's is obviously very concerned about this. I asked her to have her orthopedist assess this at the next visit

## 2012-05-26 NOTE — Patient Instructions (Addendum)
Repeat the isometric exercises discussed 4- 5 times prior to standing if you've been seated for a period of time.  Please  schedule  Medicare Wellness exam.Please try to go on My Chart within the next 24 hours to allow me to release the results directly to you.   Please have your orthopedists examine the clavicles at your next office visit

## 2012-05-27 NOTE — ED Provider Notes (Signed)
Medical screening examination/treatment/procedure(s) were conducted as a shared visit with non-physician practitioner(s) and myself.  I personally evaluated the patient during the encounter  81yF s/p fall. Mechanical in nature. No acute neuro complaints. Imaging neg. Scalp lac which repaired. Head injury instructions discussed. Outtp fu.  Raeford Razor, MD 05/27/12 [phone]

## 2012-07-20 ENCOUNTER — Ambulatory Visit (INDEPENDENT_AMBULATORY_CARE_PROVIDER_SITE_OTHER): Payer: 59 | Admitting: Internal Medicine

## 2012-07-20 ENCOUNTER — Encounter: Payer: Self-pay | Admitting: Internal Medicine

## 2012-07-20 VITALS — BP 106/68 | HR 83 | Wt 92.4 lb

## 2012-07-20 DIAGNOSIS — Z8673 Personal history of transient ischemic attack (TIA), and cerebral infarction without residual deficits: Secondary | ICD-10-CM

## 2012-07-20 DIAGNOSIS — I6350 Cerebral infarction due to unspecified occlusion or stenosis of unspecified cerebral artery: Secondary | ICD-10-CM

## 2012-07-20 DIAGNOSIS — M546 Pain in thoracic spine: Secondary | ICD-10-CM

## 2012-07-20 DIAGNOSIS — I639 Cerebral infarction, unspecified: Secondary | ICD-10-CM

## 2012-07-20 DIAGNOSIS — W19XXXA Unspecified fall, initial encounter: Secondary | ICD-10-CM

## 2012-07-20 HISTORY — DX: Personal history of transient ischemic attack (TIA), and cerebral infarction without residual deficits: Z86.73

## 2012-07-20 NOTE — Progress Notes (Signed)
  Subjective:    Patient ID: Elizabeth [name], female    DOB: 10/27/1931, 78 y.o.   MRN: [id number]  HPI BACK PAIN: Location: upper back  Quality: sharp , intermittent, lasts seconds- minutes  Onset:after fall 05/21/12  ; she lost balance walking back to bed Worse with: rising from supine position  Better with: no treatment   Radiation: no    PMH of osteopenia; no chronic steroid use       Review of Systems Fecal/urinary incontinence: no ; prev on Vesicare for urinary incontinence  Numbness/Weakness:no  Fever/chills/sweats:no Night pain: no Unexplained weight loss: no     Objective:   Physical Exam Gen.: Thin but healthy and well-nourished in appearance. Alert, appropriate and cooperative throughout exam.Appears younger than stated age  Head: Normocephalic without obvious abnormalities  Neck: No deformities, masses, or tenderness noted. Decreased ROM laterally                                                                                    Musculoskeletal/extremities:marked lordosis  noted of  the thoracic  spine. No clubbing, cyanosis, edema noted. Marked OA finger changes. Nail health  good.  Neurologic: Alert and oriented x3. Deep tendon reflexes symmetrical and normal.          Skin: Intact without suspicious lesions or rashes. Lymph: No cervical, axillary lymphadenopathy present. Psych: Mood and affect are normal. Normally interactive                                                                                         Assessment & Plan:  #1 thoracic pain post fall # 2 osteopenia Plan: See orders and recommendations l

## 2012-07-20 NOTE — Patient Instructions (Addendum)
Order for x-rays entered into  the computer; these will be performed at [address] across from [facility]. No appointment is necessary. Please try to go on My Chart within the next 24 hours to allow me to release the results directly to you.  

## 2012-07-22 ENCOUNTER — Ambulatory Visit: Payer: 59

## 2012-07-22 ENCOUNTER — Ambulatory Visit (INDEPENDENT_AMBULATORY_CARE_PROVIDER_SITE_OTHER)
Admission: RE | Admit: 2012-07-22 | Discharge: 2012-07-22 | Disposition: A | Discharge status: Home / Self Care | Payer: 59 | Source: Ambulatory Visit | Attending: Internal Medicine | Admitting: Internal Medicine

## 2012-07-22 ENCOUNTER — Other Ambulatory Visit: Payer: Self-pay | Admitting: Internal Medicine

## 2012-07-22 DIAGNOSIS — W19XXXA Unspecified fall, initial encounter: Secondary | ICD-10-CM

## 2012-07-22 DIAGNOSIS — M546 Pain in thoracic spine: Secondary | ICD-10-CM

## 2012-07-23 ENCOUNTER — Encounter: Payer: Self-pay | Admitting: *Deleted

## 2012-07-29 ENCOUNTER — Encounter: Payer: 59 | Admitting: Internal Medicine

## 2012-08-11 ENCOUNTER — Encounter: Payer: 59 | Admitting: Internal Medicine

## 2012-09-10 ENCOUNTER — Telehealth: Payer: Self-pay | Admitting: Internal Medicine

## 2012-09-10 DIAGNOSIS — T887XXA Unspecified adverse effect of drug or medicament, initial encounter: Secondary | ICD-10-CM

## 2012-09-10 DIAGNOSIS — Z0000 Encounter for general adult medical examination without abnormal findings: Secondary | ICD-10-CM

## 2012-09-10 DIAGNOSIS — E785 Hyperlipidemia, unspecified: Secondary | ICD-10-CM

## 2012-09-10 DIAGNOSIS — D518 Other vitamin B12 deficiency anemias: Secondary | ICD-10-CM

## 2012-09-10 DIAGNOSIS — M8580 Other specified disorders of bone density and structure, unspecified site: Secondary | ICD-10-CM

## 2012-09-10 DIAGNOSIS — E039 Hypothyroidism, unspecified: Secondary | ICD-10-CM

## 2012-09-10 NOTE — Telephone Encounter (Signed)
Tried calling the patient to maker her aware Elizabeth [name] and Elizabeth [name] are out of the office today and we will place the orders when we have been made aware what to order but the patient's line would ring for a few seconds and then go busy. Unable to leave a message        KP

## 2012-09-10 NOTE — Telephone Encounter (Signed)
pt wants labs done prior to CPE at [facility] can you put in orders--per call from pt at 8:13am Cb# [phone]

## 2012-09-13 NOTE — Telephone Encounter (Signed)
Spoke with patient, patient states she contacted her insurance company and they do cover labs done prior to appointment. Orders placed for [facility], patient informed of [facility] business hours

## 2012-09-13 NOTE — Telephone Encounter (Signed)
I called patient to confirm that she has checked with her insurance, patient stated she will check and call back. If insurance company cover's I will place lab order for patient

## 2012-09-14 ENCOUNTER — Other Ambulatory Visit (INDEPENDENT_AMBULATORY_CARE_PROVIDER_SITE_OTHER): Payer: 59

## 2012-09-14 DIAGNOSIS — Z0000 Encounter for general adult medical examination without abnormal findings: Secondary | ICD-10-CM

## 2012-09-14 DIAGNOSIS — M8580 Other specified disorders of bone density and structure, unspecified site: Secondary | ICD-10-CM

## 2012-09-14 DIAGNOSIS — E785 Hyperlipidemia, unspecified: Secondary | ICD-10-CM

## 2012-09-14 DIAGNOSIS — M899 Disorder of bone, unspecified: Secondary | ICD-10-CM

## 2012-09-14 DIAGNOSIS — T887XXA Unspecified adverse effect of drug or medicament, initial encounter: Secondary | ICD-10-CM

## 2012-09-14 DIAGNOSIS — E039 Hypothyroidism, unspecified: Secondary | ICD-10-CM

## 2012-09-14 DIAGNOSIS — D518 Other vitamin B12 deficiency anemias: Secondary | ICD-10-CM

## 2012-09-14 LAB — CBC WITH DIFFERENTIAL/PLATELET
Basophils Absolute: 0 10*3/uL (ref 0.0–0.1)
Basophils Relative: 0.5 % (ref 0.0–3.0)
Eosinophils Absolute: 0.1 10*3/uL (ref 0.0–0.7)
Lymphocytes Relative: 32.6 % (ref 12.0–46.0)
MCHC: 33.6 g/dL (ref 30.0–36.0)
Monocytes Relative: 9 % (ref 3.0–12.0)
Neutrophils Relative %: 55.7 % (ref 43.0–77.0)
RBC: 3.95 Mil/uL (ref 3.87–5.11)
WBC: 5.1 10*3/uL (ref 4.5–10.5)

## 2012-09-14 LAB — HEPATIC FUNCTION PANEL
ALT: 17 U/L (ref 0–35)
AST: 25 U/L (ref 0–37)
Albumin: 3.4 g/dL — ABNORMAL LOW (ref 3.5–5.2)
Alkaline Phosphatase: 46 U/L (ref 39–117)
Total Protein: 6.5 g/dL (ref 6.0–8.3)

## 2012-09-14 LAB — BASIC METABOLIC PANEL
CO2: 26 mEq/L (ref 19–32)
Calcium: 8.9 mg/dL (ref 8.4–10.5)
Creatinine, Ser: 1 mg/dL (ref 0.4–1.2)
GFR: 54.85 mL/min — ABNORMAL LOW (ref >60.00)

## 2012-09-14 LAB — LIPID PANEL
Cholesterol: 169 mg/dL (ref 0–200)
HDL: 56.4 mg/dL (ref >39.00)
Triglycerides: 98 mg/dL (ref 0.0–149.0)

## 2012-09-16 LAB — VITAMIN B12: Vitamin B-12: 217 pg/mL (ref 211–911)

## 2012-10-08 ENCOUNTER — Ambulatory Visit (INDEPENDENT_AMBULATORY_CARE_PROVIDER_SITE_OTHER): Payer: 59 | Admitting: Internal Medicine

## 2012-10-08 ENCOUNTER — Encounter: Payer: Self-pay | Admitting: Internal Medicine

## 2012-10-08 VITALS — BP 118/72 | HR 62 | Wt 94.4 lb

## 2012-10-08 DIAGNOSIS — R32 Unspecified urinary incontinence: Secondary | ICD-10-CM

## 2012-10-08 DIAGNOSIS — M949 Disorder of cartilage, unspecified: Secondary | ICD-10-CM

## 2012-10-08 DIAGNOSIS — E782 Mixed hyperlipidemia: Secondary | ICD-10-CM

## 2012-10-08 DIAGNOSIS — E039 Hypothyroidism, unspecified: Secondary | ICD-10-CM

## 2012-10-08 DIAGNOSIS — D518 Other vitamin B12 deficiency anemias: Secondary | ICD-10-CM

## 2012-10-08 DIAGNOSIS — M899 Disorder of bone, unspecified: Secondary | ICD-10-CM

## 2012-10-08 MED ORDER — CYANOCOBALAMIN 1000 MCG/ML IJ SOLN
1000.0000 ug | Freq: Once | INTRAMUSCULAR | Status: AC
  Administered 2012-10-08: 1000 ug via INTRAMUSCULAR

## 2012-10-08 MED ORDER — MIRABEGRON ER 25 MG PO TB24: 25.0000 mg | ORAL_TABLET | Freq: Every day | ORAL | Status: DC

## 2012-10-08 NOTE — Assessment & Plan Note (Signed)
Bone mineral density should be monitored every 25 months by her gynecologist

## 2012-10-08 NOTE — Assessment & Plan Note (Signed)
TSH was therapeutic; no change in thyroid dose

## 2012-10-08 NOTE — Assessment & Plan Note (Addendum)
Dramatic decrease in B12 level off monthly injections. It is recommended she take 1000 units every other month and recheck her B12 level in  6 months. If the B12 deficiency is not corrected she would be at risk for neuropathic issues

## 2012-10-08 NOTE — Patient Instructions (Addendum)
Review and correct the record as indicated. Please share record with all medical staff seen.   Take B12 shots every other month; recheck B12 level in 6 months. Code: 281.0.  [facility]; these exercises have been shown to improve balance significantly.

## 2012-10-08 NOTE — Assessment & Plan Note (Signed)
Lipids are excellent; no change indicated

## 2012-10-08 NOTE — Assessment & Plan Note (Addendum)
Trial of Myrbetriq ER 25 mg

## 2012-10-08 NOTE — Progress Notes (Signed)
  Subjective:    Patient ID: Elizabeth [name], female    DOB: 1931/03/08, 78 y.o.   MRN: [id number]  HPI She returns for followup of labs for & to address her diagnoses as listed in the problem list.Past medical history/family history/social history were all reviewed and updated.    She had a B12 level of over 2000 on 05/26/12. B12 shots were discontinued. Her B12 level was at the extreme lower lid and normal at 217 on 09/14/12. Her hematocrit has dropped minimally from 37.1-36.2.  She is on small doses of Synthroid; TSH is therapeutic at 2.32 and stable.  She has osteopenia; her vitamin D level is therapeutic at 52. Her  BMD is overdue; her Gyn orders BMD.   Her lipids are excellent and liver function tests are normal on Crestor 10 mg 3X/week. Chemistries are also normal except for mildly reduced GFR     Review of Systems She had been taking VESIcare for incontinence. She seen Dr. Jerrye Beavers who felt surgery was not necessary despite the presence of prolapse. VESIcare has caused constipation. She denies hematuria, pyuria, or dysuria.  She is concerned that her body is "just not right" since her fall 6/13. The thoracic spine films 8/13 revealed demineralization and degenerative changes in cervical spine. No fractures were noted. She denies stool incontinence or significant numbness, tingling, or extremity weakness.  As noted above,she's overdue for a bone density     Objective:   Physical Exam General appearance: thin but in good health and nourishment w/o distress.  Eyes: No conjunctival inflammation or scleral icterus is present.  Oral exam: Dental hygiene is good; lips and gums are healthy appearing.There is no oropharyngeal erythema or exudate noted.   Heart:  Normal rate and regular rhythm. S1 and S2 normal without gallop, murmur, click, rub or other extra sounds. S4     Lungs:Chest clear to auscultation; no wheezes, rhonchi,rales ,or rubs present.No increased work of breathing.    Abdomen: bowel sounds normal, soft and non-tender without masses, organomegaly or hernias noted.  No guarding or rebound   Skin:Warm & dry.  Intact without suspicious lesions or rashes ; no jaundice . Slight tenting  Lymphatic: No lymphadenopathy is noted about the head, neck, axilla areas.   Musculoskeletal: She is able to lie flat and sit up without help. There is asymmetry of the posterior thoracic muscles suggesting occult scoliosis. She has degenerative changes of her hands            Assessment & Plan:  #1 see problem list with assessments and recommendations  #2 subjective musculoskeletal concerns in the context of degenerative joint disease, osteopenia and probable scoliosis

## 2012-10-20 ENCOUNTER — Encounter: Payer: 59 | Admitting: Internal Medicine

## 2012-11-18 ENCOUNTER — Other Ambulatory Visit: Payer: Self-pay | Admitting: Internal Medicine

## 2013-01-07 ENCOUNTER — Encounter: Payer: 59 | Admitting: Internal Medicine

## 2013-02-04 ENCOUNTER — Telehealth: Payer: Self-pay | Admitting: Internal Medicine

## 2013-02-04 NOTE — Telephone Encounter (Signed)
Samples of crestor placed up front per pt request. Patient initially only wanted to speak with Chrae, I educated pt on the team approach and that any of our clinical staff can help her. Patient verbalized understanding.

## 2013-02-04 NOTE — Telephone Encounter (Signed)
pt called states she only wants to speak with Chrea about her crestor. I did ask the patient if she needed a refill or was having issues witht he medication but she refused to answer and stated she would speak with Chrea about it cb# [phone]

## 2013-03-31 ENCOUNTER — Telehealth: Payer: Self-pay | Admitting: Internal Medicine

## 2013-03-31 NOTE — Telephone Encounter (Signed)
Pt called back and advised that her appt was a follow up from her November appt to recheck B12 and some additional high labs.

## 2013-03-31 NOTE — Telephone Encounter (Signed)
Patient called to find out what her appointment was for on 04/08/13. The only thing I see is that she is due to have her b12 level rechecked. Patient also states she is due to have her protein level checked, but I am not sure what this is. Patient would like to speak with someone to discuss what she needs and when she needs it. Ok to leave a message

## 2013-04-08 ENCOUNTER — Ambulatory Visit (INDEPENDENT_AMBULATORY_CARE_PROVIDER_SITE_OTHER): Payer: 59 | Admitting: Internal Medicine

## 2013-04-08 ENCOUNTER — Encounter: Payer: Self-pay | Admitting: Internal Medicine

## 2013-04-08 VITALS — BP 118/72 | HR 62 | Wt 97.0 lb

## 2013-04-08 DIAGNOSIS — M949 Disorder of cartilage, unspecified: Secondary | ICD-10-CM

## 2013-04-08 DIAGNOSIS — M948X9 Other specified disorders of cartilage, unspecified sites: Secondary | ICD-10-CM

## 2013-04-08 DIAGNOSIS — M89319 Hypertrophy of bone, unspecified shoulder: Secondary | ICD-10-CM

## 2013-04-08 DIAGNOSIS — M899 Disorder of bone, unspecified: Secondary | ICD-10-CM

## 2013-04-08 NOTE — Patient Instructions (Addendum)
Order for x-rays entered into  the computer; these will be performed at [address] [facility] across from [facility]. No appointment is necessary. Share results with Dr Jennette Kettle for Bone Density study.

## 2013-04-08 NOTE — Progress Notes (Signed)
Subjective:    Patient ID: Elizabeth [name], female    DOB: [birth year], 79 y.o.   MRN: [id number]  HPI She is concerned about asymmetry of the clavicular heads; the right is palpably larger than the left. This issue has been raised in the past. Films were made of the clavicles as well as the spine 07/22/12 because of these concerns and a history of falls 6/28.  She's also concerned about the bone integrity in her spine. Her last bone density was performed at her gynecologist's office. She is unsure of the date or the results. She states she was told that the bone density findings were "not bad". She is unsure as to whether she has taken bone building therapy such as Fosamax in the past. She does take vitamin C 2000 international units daily. She has a history of wrist fracture. She is unsure of any family history of osteoporosis. Vitamin D level was therapeutic at 50 in October 2013  The x-rays 8/29 were reviewed with her. There does appear to be some asymmetry of the clavicular heads. No distinct bone lesion is noted. The lateral thoracic films showed demineralization with no loss of vertebral height.      Review of Systems In reference to the clavicles she hasn't noted pain, swelling, or change in color of the skin.  She is not having pain in the spine. She has no associated limb  numbness, or tingling. She describes legs as "weaker".She has no incontinence of urine or stool.   She is asking about a program that might involve her being in an upside down position.       Objective:   Physical Exam Gen.: Thin but healthy and well-nourished in appearance. Alert, appropriate and cooperative throughout exam.Appears much younger than stated age  Head: Normocephalic without obvious abnormalities  Neck: No deformities, masses, or tenderness noted. Range of motion decreased. Thyroid normal. Lungs: Normal respiratory effort; chest expands symmetrically. Lungs are clear to auscultation without rales,  wheezes, or increased work of breathing.                                   Musculoskeletal/extremities: The clavicular heads actually appeared to measure 3.5 cm bilaterally. There is no tenderness to palpation of the clavicular heads. There is no change in temperature or color of the skin. Mild accentuated curvature of mid thoracicspine. No clubbing, cyanosis,or edema.Significant  DIP deformities noted. Range of motion normal .Tone & strength  Normal. Joints normal / reveal mild  DJD DIP changes. Nail health good. Able to lie down & sit up w/o help. Negative SLR bilaterally. Mild crepitus of the knees without effusion. Neurologic: Alert and oriented x3. Deep tendon reflexes symmetrical and normal.         Skin: Intact without suspicious lesions or rashes. Lymph: No cervical, axillary lymphadenopathy present. Psych: Mood and affect are normal. Normally interactive                                                                                        Assessment & Plan:  #1 subjective  anomaly of the clavicular heads  #2 osteopenia  Plan: See orders and recommendations. Films will be made of the clavicles as she is mostly concerned about this issue.  I encourage her to have a followup bone density repeated at her gynecologist. I have emphasized that the bone density should be repeated at the same institution to provide continuity and veracity of data

## 2013-05-06 ENCOUNTER — Telehealth: Payer: Self-pay | Admitting: Internal Medicine

## 2013-05-06 NOTE — Telephone Encounter (Signed)
Patient is calling to see if she can have an x-ray of her back and clavicle. States that she would like to know if she has any degenerated discs in her back.

## 2013-05-06 NOTE — Telephone Encounter (Signed)
Discuss with patient will get x-ray done of clavicle and then schedule OV with hopp to discuss. Pt decline to see ortho at this time

## 2013-05-06 NOTE — Telephone Encounter (Signed)
There is an order for a film of the clavicle which was placed in May; film was never completed  She has had x-rays of the spine within the last year. There were degenerative changes in the neck. If having significant back problem; I recommend nonoperative orthopedic specialists such as Dr. Ethelene Hal.

## 2013-05-06 NOTE — Telephone Encounter (Signed)
Please advise 

## 2013-05-08 ENCOUNTER — Encounter: Payer: Self-pay | Admitting: Internal Medicine

## 2013-05-10 ENCOUNTER — Ambulatory Visit (INDEPENDENT_AMBULATORY_CARE_PROVIDER_SITE_OTHER)
Admission: RE | Admit: 2013-05-10 | Discharge: 2013-05-10 | Disposition: A | Discharge status: Home / Self Care | Payer: 59 | Source: Ambulatory Visit | Attending: Internal Medicine | Admitting: Internal Medicine

## 2013-05-10 DIAGNOSIS — M89319 Hypertrophy of bone, unspecified shoulder: Secondary | ICD-10-CM

## 2013-05-10 DIAGNOSIS — M948X9 Other specified disorders of cartilage, unspecified sites: Secondary | ICD-10-CM

## 2013-05-16 ENCOUNTER — Encounter: Payer: Self-pay | Admitting: Internal Medicine

## 2013-05-20 ENCOUNTER — Ambulatory Visit: Payer: 59 | Admitting: Internal Medicine

## 2013-05-20 ENCOUNTER — Encounter: Payer: Self-pay | Admitting: Internal Medicine

## 2013-06-02 ENCOUNTER — Encounter: Payer: Self-pay | Admitting: Internal Medicine

## 2013-07-08 ENCOUNTER — Encounter: Payer: Self-pay | Admitting: Internal Medicine

## 2013-07-08 ENCOUNTER — Ambulatory Visit (INDEPENDENT_AMBULATORY_CARE_PROVIDER_SITE_OTHER): Payer: 59 | Admitting: Internal Medicine

## 2013-07-08 VITALS — BP 118/70 | HR 71 | Temp 97.7°F | Resp 12 | Ht 58.08 in | Wt 96.2 lb

## 2013-07-08 DIAGNOSIS — E782 Mixed hyperlipidemia: Secondary | ICD-10-CM

## 2013-07-08 DIAGNOSIS — E039 Hypothyroidism, unspecified: Secondary | ICD-10-CM

## 2013-07-08 DIAGNOSIS — Z0000 Encounter for general adult medical examination without abnormal findings: Secondary | ICD-10-CM

## 2013-07-08 DIAGNOSIS — D518 Other vitamin B12 deficiency anemias: Secondary | ICD-10-CM

## 2013-07-08 DIAGNOSIS — M899 Disorder of bone, unspecified: Secondary | ICD-10-CM

## 2013-07-08 DIAGNOSIS — M949 Disorder of cartilage, unspecified: Secondary | ICD-10-CM

## 2013-07-08 LAB — LIPID PANEL
HDL: 58.5 mg/dL (ref >39.00)
Total CHOL/HDL Ratio: 3
VLDL: 31.2 mg/dL (ref 0.0–40.0)

## 2013-07-08 LAB — AST: AST: 26 U/L (ref 0–37)

## 2013-07-08 LAB — VITAMIN B12: Vitamin B-12: 189 pg/mL — ABNORMAL LOW (ref 211–911)

## 2013-07-08 NOTE — Patient Instructions (Addendum)

## 2013-07-08 NOTE — Progress Notes (Signed)
Subjective:    Patient ID: Elizabeth [name], female    DOB: 04-25-1931, 79 y.o.   MRN: [id number]  HPI Medicare Wellness Visit:  Psychosocial & medical history were reviewed as required by Medicare (abuse,antisocial behavioral risks,firearm risk).  Social history: caffeine: 1&1/2 cups coffee/day , alcohol: very rarely  ,  tobacco use: never   Exercise :  See below Home & personal  safety / fall risk: no Limitations of activities of daily living:no Seatbelt  and smoke alarm use:yes Power of Attorney/Living Will status :  No HCPoA Ophthalmology exam status :current Hearing evaluation status: not current Orientation :oriented X 3 Memory & recall :good Math testing:good Active depression / anxiety:denied Foreign travel history : never Immunization status for Shingles /Flu/ PNA/ tetanus :no Flu shots ; no Shingles to date Transfusion history:  no Preventive health surveillance status of colonoscopy, BMD , mammograms,PAP as per protocol/ SOC:no colonoscopy Dental care:  Every 4 mos Chart reviewed &  Updated. Active issues reviewed & addressed.      Review of Systems  She is on a heart healthy diet; she exercises as calisthentics  daily 20 minutes without symptoms. Specifically she denies chest pain, palpitations, dyspnea, or claudication.  Family history is negative for premature coronary disease .  Advanced cholesterol testing reveals her LDL goal is less than 100. There is medication compliance with the statin as Crestor 3 X / week. Significant abdominal symptoms, memory deficit, or myalgias denied.  She has  not been taking B12 shots regularly. Her B12 level was over 2000  in July 2013. Recheck revealed a value of 217 in October of that year.  Her TSH has been therapeutic on her present dose of thyroid supplementation. .    Objective:   Physical Exam  Gen.: Very thin but healthy and well-nourished in appearance. Alert, appropriate and cooperative throughout exam.Appears younger than  stated age  Head: Normocephalic without obvious abnormalities  Eyes: No corneal or conjunctival inflammation noted.  Extraocular motion intact. Vision grossly normal without lenses Ears: External  ear exam reveals no significant lesions or deformities. Canals clear .TMs normal. Hearing is grossly decreased bilaterally, L >R. Nose: External nasal exam reveals no deformity or inflammation. Nasal mucosa are pink and moist. No lesions or exudates noted. Septum to R  Mouth: Oral mucosa and oropharynx reveal no lesions or exudates. Teeth in good repair. Neck: No deformities, masses, or tenderness noted. Range of motion decreased. Thyroid small. Lungs: Normal respiratory effort; chest expands symmetrically. Lungs are clear to auscultation without rales, wheezes, or increased work of breathing. Heart: Normal rate and rhythm. Accentuated S1 ;normal S2. No gallop, click, or rub. S4 w/o murmur. Abdomen: Bowel sounds normal; abdomen soft and nontender. No masses, organomegaly or hernias noted. Genitalia: As per Gyn                                  Musculoskeletal/extremities: Accentuated curvature of upper thoracic  Spine. No clubbing, cyanosis, edema, or significant extremity  deformity noted. Range of motion normal .Tone & strength  Normal. Joints  reveal marked  DJD DIP changes. Nail health good. Decreased knee ROM Able to lie down & sit up w/o help. Negative SLR bilaterally Vascular: Carotid, radial artery, dorsalis pedis and  posterior tibial pulses are full and equal. No bruits present. Neurologic: Alert and oriented x3. Deep tendon reflexes symmetrical and normal.         Skin: Intact  without suspicious lesions or rashes. Lymph: No cervical, axillary lymphadenopathy present. Psych: Mood and affect are normal. Normally interactive                                                                                        Assessment & Plan:  #1 Medicare Wellness Exam; criteria met ; data entered #2  Problem List/Diagnoses reviewed Plan:  Assessments made/ Orders entered

## 2013-07-11 LAB — CBC WITH DIFFERENTIAL/PLATELET
Basophils Relative: 0.3 % (ref 0.0–3.0)
Eosinophils Absolute: 0.9 10*3/uL — ABNORMAL HIGH (ref 0.0–0.7)
Hemoglobin: 11.5 g/dL — ABNORMAL LOW (ref 12.0–15.0)
Lymphocytes Relative: 26.9 % (ref 12.0–46.0)
MCHC: 34.6 g/dL (ref 30.0–36.0)
Monocytes Relative: 7.2 % (ref 3.0–12.0)
Neutro Abs: 2.7 10*3/uL (ref 1.4–7.7)
RBC: 3.66 Mil/uL — ABNORMAL LOW (ref 3.87–5.11)

## 2013-07-12 LAB — VITAMIN D 1,25 DIHYDROXY: Vitamin D 1, 25 (OH)2 Total: 33 pg/mL (ref 18–72)

## 2013-07-29 ENCOUNTER — Ambulatory Visit (INDEPENDENT_AMBULATORY_CARE_PROVIDER_SITE_OTHER): Payer: 59

## 2013-07-29 DIAGNOSIS — D518 Other vitamin B12 deficiency anemias: Secondary | ICD-10-CM

## 2013-07-29 MED ORDER — CYANOCOBALAMIN 1000 MCG/ML IJ SOLN
1000.0000 ug | Freq: Once | INTRAMUSCULAR | Status: AC
  Administered 2013-07-29: 1000 ug via INTRAMUSCULAR

## 2013-08-05 ENCOUNTER — Ambulatory Visit (INDEPENDENT_AMBULATORY_CARE_PROVIDER_SITE_OTHER): Payer: 59

## 2013-08-05 DIAGNOSIS — E538 Deficiency of other specified B group vitamins: Secondary | ICD-10-CM

## 2013-08-05 MED ORDER — CYANOCOBALAMIN 1000 MCG/ML IJ SOLN
1000.0000 ug | Freq: Once | INTRAMUSCULAR | Status: AC
  Administered 2013-08-05: 1000 ug via INTRAMUSCULAR

## 2013-08-12 ENCOUNTER — Ambulatory Visit (INDEPENDENT_AMBULATORY_CARE_PROVIDER_SITE_OTHER): Payer: 59

## 2013-08-12 DIAGNOSIS — E538 Deficiency of other specified B group vitamins: Secondary | ICD-10-CM

## 2013-08-12 MED ORDER — CYANOCOBALAMIN 1000 MCG/ML IJ SOLN
1000.0000 ug | Freq: Once | INTRAMUSCULAR | Status: AC
  Administered 2013-08-12: 1000 ug via INTRAMUSCULAR

## 2013-08-18 ENCOUNTER — Other Ambulatory Visit: Payer: Self-pay | Admitting: Internal Medicine

## 2013-08-19 ENCOUNTER — Ambulatory Visit (INDEPENDENT_AMBULATORY_CARE_PROVIDER_SITE_OTHER): Payer: 59 | Admitting: *Deleted

## 2013-08-19 DIAGNOSIS — E538 Deficiency of other specified B group vitamins: Secondary | ICD-10-CM

## 2013-08-19 MED ORDER — CYANOCOBALAMIN 1000 MCG/ML IJ SOLN
1000.0000 ug | Freq: Once | INTRAMUSCULAR | Status: AC
  Administered 2013-08-19: 1000 ug via INTRAMUSCULAR

## 2013-08-19 NOTE — Telephone Encounter (Signed)
Med filled.  

## 2013-09-16 ENCOUNTER — Ambulatory Visit (INDEPENDENT_AMBULATORY_CARE_PROVIDER_SITE_OTHER): Payer: 59 | Admitting: *Deleted

## 2013-09-16 DIAGNOSIS — E538 Deficiency of other specified B group vitamins: Secondary | ICD-10-CM

## 2013-09-16 MED ORDER — CYANOCOBALAMIN 1000 MCG/ML IJ SOLN
1000.0000 ug | Freq: Once | INTRAMUSCULAR | Status: AC
  Administered 2013-09-16: 1000 ug via INTRAMUSCULAR

## 2013-11-11 ENCOUNTER — Ambulatory Visit (INDEPENDENT_AMBULATORY_CARE_PROVIDER_SITE_OTHER): Payer: 59 | Admitting: *Deleted

## 2013-11-11 DIAGNOSIS — E538 Deficiency of other specified B group vitamins: Secondary | ICD-10-CM

## 2013-11-11 MED ORDER — CYANOCOBALAMIN 1000 MCG/ML IJ SOLN
1000.0000 ug | Freq: Once | INTRAMUSCULAR | Status: AC
  Administered 2013-11-11: 1000 ug via INTRAMUSCULAR

## 2013-12-01 ENCOUNTER — Ambulatory Visit: Payer: 59 | Admitting: Internal Medicine

## 2013-12-08 ENCOUNTER — Ambulatory Visit (INDEPENDENT_AMBULATORY_CARE_PROVIDER_SITE_OTHER): Payer: 59 | Admitting: Internal Medicine

## 2013-12-08 ENCOUNTER — Encounter: Payer: Self-pay | Admitting: Internal Medicine

## 2013-12-08 VITALS — BP 136/75 | HR 70 | Temp 98.4°F | Wt 94.8 lb

## 2013-12-08 DIAGNOSIS — R269 Unspecified abnormalities of gait and mobility: Secondary | ICD-10-CM

## 2013-12-08 DIAGNOSIS — D518 Other vitamin B12 deficiency anemias: Secondary | ICD-10-CM

## 2013-12-08 DIAGNOSIS — R2689 Other abnormalities of gait and mobility: Secondary | ICD-10-CM

## 2013-12-08 DIAGNOSIS — G250 Essential tremor: Secondary | ICD-10-CM

## 2013-12-08 DIAGNOSIS — G252 Other specified forms of tremor: Secondary | ICD-10-CM

## 2013-12-08 HISTORY — DX: Other specified forms of tremor: G25.2

## 2013-12-08 LAB — TSH: TSH: 0.96 u[IU]/mL (ref 0.35–5.50)

## 2013-12-08 LAB — VITAMIN B12: Vitamin B-12: 371 pg/mL (ref 211–911)

## 2013-12-08 NOTE — Progress Notes (Signed)
   Subjective:    Patient ID: Elizabeth [name], female    DOB: 1931/04/26, 80 y.o.   MRN: [id number]  HPI   She describes an intermittent tremor since she fell sustaining a head laceration June/28/13. Initially it was on the left but now is bilateral. It only occurs with purposeful motion and not at rest  She drinks little to one cup of coffee a day but has no other stimulant intake. She sleeps well.  There is no family history of neurologic disease other than Alzheimer's. Specifically there is no Parkinson's.  She does have B12 deficiency; last value was 189 in August of 2014. She continues to take monthly shots    Review of Systems She denies numbness, tingling or paralysis of any limb.  She has no constitutional symptoms of fever, chills, sweats, or weight loss.      Objective:   Physical Exam    She is thin but well-nourished. She appears much younger than her stated age. She does not have loss of facial expression.  She has no lymphadenopathy about the head, neck, axilla  Strength testing to opposition is good in the upper and lower extremities.  She does have degenerative joint changes in her hands  At this time the tremor could not be elicited.  Her gait is slightly broad and deliberate. Arm swing is normal as is her turning radius.       Assessment & Plan:  #1 benign intention tremor, intermittent and bilateral  Plan: Pathophysiology discussed.

## 2013-12-08 NOTE — Patient Instructions (Addendum)
Your next office appointment will be determined based upon review of your pending labs . Those instructions will be transmitted to you  by mail . Followup as needed for your this acute issue. Please report any significant change in your symptoms. Benign intention tremors are typically genetic. It is a tremor typically aggravated by purposeful activity. Additionally stimulants such as decongestants, diet pills, nicotine, or caffeine can aggravate it. If it is problematic the nonselective beta blocker propranolol could be prescribed.  I recommend an Physical Therapy consultation to determine optimal therapy for imbalance.

## 2013-12-08 NOTE — Progress Notes (Signed)
Pre visit review using our clinic review tool, if applicable. No additional management support is needed unless otherwise documented below in the visit note. 

## 2013-12-11 ENCOUNTER — Other Ambulatory Visit: Payer: Self-pay | Admitting: Internal Medicine

## 2013-12-11 DIAGNOSIS — E039 Hypothyroidism, unspecified: Secondary | ICD-10-CM

## 2013-12-23 ENCOUNTER — Ambulatory Visit (INDEPENDENT_AMBULATORY_CARE_PROVIDER_SITE_OTHER): Payer: 59 | Admitting: *Deleted

## 2013-12-23 DIAGNOSIS — E538 Deficiency of other specified B group vitamins: Secondary | ICD-10-CM

## 2013-12-23 MED ORDER — CYANOCOBALAMIN 1000 MCG/ML IJ SOLN
1000.0000 ug | Freq: Once | INTRAMUSCULAR | Status: AC
  Administered 2013-12-23: 1000 ug via INTRAMUSCULAR

## 2014-01-27 ENCOUNTER — Ambulatory Visit: Payer: 59

## 2014-02-03 ENCOUNTER — Ambulatory Visit: Payer: 59

## 2014-02-09 ENCOUNTER — Ambulatory Visit (INDEPENDENT_AMBULATORY_CARE_PROVIDER_SITE_OTHER): Payer: 59 | Admitting: *Deleted

## 2014-02-09 DIAGNOSIS — D518 Other vitamin B12 deficiency anemias: Secondary | ICD-10-CM

## 2014-02-09 MED ORDER — CYANOCOBALAMIN 1000 MCG/ML IJ SOLN
1000.0000 ug | Freq: Once | INTRAMUSCULAR | Status: AC
  Administered 2014-02-09: 1000 ug via INTRAMUSCULAR

## 2014-03-10 ENCOUNTER — Other Ambulatory Visit: Payer: Self-pay | Admitting: Internal Medicine

## 2014-03-30 ENCOUNTER — Ambulatory Visit (INDEPENDENT_AMBULATORY_CARE_PROVIDER_SITE_OTHER): Payer: 59

## 2014-03-30 DIAGNOSIS — D518 Other vitamin B12 deficiency anemias: Secondary | ICD-10-CM

## 2014-03-30 MED ORDER — CYANOCOBALAMIN 1000 MCG/ML IJ SOLN
1000.0000 ug | Freq: Once | INTRAMUSCULAR | Status: AC
  Administered 2014-03-30: 1000 ug via INTRAMUSCULAR

## 2014-05-18 ENCOUNTER — Encounter: Payer: Self-pay | Admitting: Internal Medicine

## 2014-05-18 ENCOUNTER — Ambulatory Visit (INDEPENDENT_AMBULATORY_CARE_PROVIDER_SITE_OTHER): Payer: 59 | Admitting: Internal Medicine

## 2014-05-18 ENCOUNTER — Other Ambulatory Visit (INDEPENDENT_AMBULATORY_CARE_PROVIDER_SITE_OTHER): Payer: 59

## 2014-05-18 ENCOUNTER — Ambulatory Visit: Payer: 59 | Admitting: Internal Medicine

## 2014-05-18 VITALS — BP 126/80 | HR 63 | Temp 98.1°F | Wt 99.8 lb

## 2014-05-18 DIAGNOSIS — R269 Unspecified abnormalities of gait and mobility: Secondary | ICD-10-CM

## 2014-05-18 DIAGNOSIS — M6281 Muscle weakness (generalized): Secondary | ICD-10-CM

## 2014-05-18 DIAGNOSIS — K219 Gastro-esophageal reflux disease without esophagitis: Secondary | ICD-10-CM

## 2014-05-18 DIAGNOSIS — D518 Other vitamin B12 deficiency anemias: Secondary | ICD-10-CM

## 2014-05-18 DIAGNOSIS — G250 Essential tremor: Secondary | ICD-10-CM

## 2014-05-18 DIAGNOSIS — G252 Other specified forms of tremor: Secondary | ICD-10-CM

## 2014-05-18 DIAGNOSIS — G2581 Restless legs syndrome: Secondary | ICD-10-CM

## 2014-05-18 DIAGNOSIS — E039 Hypothyroidism, unspecified: Secondary | ICD-10-CM

## 2014-05-18 DIAGNOSIS — R2689 Other abnormalities of gait and mobility: Secondary | ICD-10-CM

## 2014-05-18 LAB — IBC PANEL
Iron: 61 ug/dL (ref 42–145)
Saturation Ratios: 18.8 % — ABNORMAL LOW (ref 20.0–50.0)
Transferrin: 232.2 mg/dL (ref 212.0–360.0)

## 2014-05-18 LAB — TSH: TSH: 2.17 u[IU]/mL (ref 0.35–4.50)

## 2014-05-18 MED ORDER — CYANOCOBALAMIN 1000 MCG/ML IJ SOLN
1000.0000 ug | Freq: Once | INTRAMUSCULAR | Status: AC
  Administered 2014-05-18: 1000 ug via INTRAMUSCULAR

## 2014-05-18 NOTE — Progress Notes (Signed)
   Subjective:    Patient ID: Shariyah Eland Manninen, female    DOB: 05/09/1931, 80 y.o.   MRN: [id number]  HPI Pt was seen in clinic 12/08/13 for a benign intention tremor. She presents today having restlessness in her bilaterally LEs. This began 6 months ago. It occurs when the patient sits down to "relax" for 15 minutes or longer.  The pt can play bridge or scrabble and sit down for 3 hours at a time without having restlessness. There is no pain. She believes the restlessness is due to nerves. The restlessness does not bother the pt in bed at night when she is trying to sleep.  The pt also noticed a hyperpigmented spot on the medial aspect of her R shin, superior to her ankle. The hyperpigmentation is not bothersome, painful nor itchy. The hyperpigmentation is described as "rough and scaly." The hyperpigmentation as been present for at least a year the pt believes.   She also is requesting a referral to Dr. Angela Adam for PT, who she currently sees now to help with balance problems.    She also needs a Vit B12 shot today.    Review of Systems  Constitutional: Negative for fever and chills.  Respiratory: Negative for shortness of breath.   Cardiovascular: Negative for chest pain.  Neurological: Negative for dizziness, numbness and headaches.       No tingling       Objective:   Physical Exam Bilateral LE trace edema  Moderate DIP changes bilaterally to pinky fingers  Essential tremor in hands bilaterally   Hyperpigmented patch on medial aspect suprior to the R ankle. Blanchable with no warmth to touch.                                                                                                                                               Assessment & Plan:

## 2014-05-18 NOTE — Patient Instructions (Addendum)
Mix Eucerin 1 part to Cort Aid 1 part and apply  Twice a day to dry area of skin as needed .Dermatology referral if no better.

## 2014-05-18 NOTE — Progress Notes (Signed)
Pre visit review using our clinic review tool, if applicable. No additional management support is needed unless otherwise documented below in the visit note. 

## 2014-05-18 NOTE — Progress Notes (Signed)
   Subjective:    Patient ID: Elizabeth [name], female    DOB: 25-Sep-1931, 80 y.o.   MRN: [id number]  HPI She is here to be evaluated for sensation of restlessness in her legs. This occurs if she sits for more than five minutes. It does not occur if she sitting for three hours playing bridge or Scrabble. It is also not nocturnal in nature.  She's been seen by Dr. Angela Adam, Physical Therapist for balance issues with good response.His office notes were reviewed.  She has a benign intention tremor.  She's concerned about a hyper pigmented "rough, scaly " lesion of the right lower extremity which has been present for a year  PMH of GERD. No documented hx iron deficiency.  Last TSH 0.96.   Review of Systems  No fever, chills, significant weight change, fatigue, weakness or night sweats. No  limb weakness, tremor,  numbness or tingling     Objective:   Physical Exam  Significant or distinguishing  findings on physical exam are documented first.  Below that are other systems examined & findings.  Decreased cervical ROM. Slow S4. Severe DJD of hands. Localized dry , slightly erythematous eczematoid dermatitis R calf medially . Slow, deliberate choppy gait.  Appears younger than age. Thin but  healthy and well-nourished & in no acute distress No carotid bruits are present.No neck pain distention present at 10 - 15 degrees. Thyroid normal to palpation  Heart rhythm and rate are normal with no gallop or murmur  Chest is clear with no increased work of breathing  There is no evidence of aortic aneurysm or renal artery bruits  Abdomen soft with no organomegaly or masses. No HJR. No AAA  No clubbing, cyanosis or edema present.  Pedal pulses are intact   No ischemic skin changes are present . Fingernails/ toenails healthy   Alert and oriented. Strength, tone, DTRs reflexes normal           Assessment & Plan:  #1 possible restless leg syndrome variant. See Diagnoses  Plan: TSH  will be checked. If it's therapeutic and iron levels are normal; consideration would be given to very low-dose amitriptyline 10 mg at bedtime.

## 2014-05-19 ENCOUNTER — Telehealth: Payer: Self-pay | Admitting: *Deleted

## 2014-05-19 NOTE — Telephone Encounter (Signed)
Left msg on triage requesting samples of crestor. Called pt back lmom will leave for pick-up...Johny Chess

## 2014-05-21 ENCOUNTER — Other Ambulatory Visit: Payer: Self-pay | Admitting: Internal Medicine

## 2014-05-21 MED ORDER — AMITRIPTYLINE HCL 10 MG PO TABS: 10.0000 mg | ORAL_TABLET | Freq: Every day | ORAL | Status: DC

## 2014-05-22 MED ORDER — AMITRIPTYLINE HCL 10 MG PO TABS: 10.0000 mg | ORAL_TABLET | Freq: Every day | ORAL | Status: DC

## 2014-05-22 NOTE — Addendum Note (Signed)
Addended by: Shelly Coss on: 05/22/2014 08:27 AM   Modules accepted: Orders

## 2014-05-31 ENCOUNTER — Telehealth: Payer: Self-pay | Admitting: Internal Medicine

## 2014-05-31 DIAGNOSIS — R269 Unspecified abnormalities of gait and mobility: Secondary | ICD-10-CM

## 2014-05-31 NOTE — Telephone Encounter (Signed)
Noel Gerold @ Dr Archie Endo [facility] facility

## 2014-05-31 NOTE — Telephone Encounter (Signed)
Patient was previously seeing Dr. Angela Adam for rehabilitaiton.  He is moving to [place].  Patient does not wish to drive that far.  She would like a referral to a physician here in [place].  Possibly Dr. Noemi Chapel.

## 2014-06-01 ENCOUNTER — Telehealth: Payer: Self-pay | Admitting: *Deleted

## 2014-06-01 NOTE — Telephone Encounter (Signed)
What diagnosis code is to be used?

## 2014-06-01 NOTE — Telephone Encounter (Signed)
Left msg on triage requesting to speak with nurse concerning a referral. caled pt back she stated she called yesterday about getting a referral to go to [facility] for PT instead of seeing Dr. Angela Adam. Inform pt md ok the referral & it has been placed. She will received call bck from the [facility] with appt, date, time...Johny Chess

## 2014-06-01 NOTE — Telephone Encounter (Signed)
Gait imbalance S/P TIA

## 2014-06-01 NOTE — Telephone Encounter (Signed)
Referral placed.

## 2014-06-02 ENCOUNTER — Telehealth: Payer: Self-pay | Admitting: Internal Medicine

## 2014-06-02 NOTE — Telephone Encounter (Signed)
Patient is following up on prescription that was to be forwarded to the [place] attn: York Pellant.  Patient has requested a call before 12.  If not you can leave a message.  The [employer] has stated they have not received a script.

## 2014-06-02 NOTE — Telephone Encounter (Signed)
Elizabeth [name] will you help with this. This is regarding the referral (not a prescription)

## 2014-06-29 ENCOUNTER — Ambulatory Visit (INDEPENDENT_AMBULATORY_CARE_PROVIDER_SITE_OTHER): Payer: 59 | Admitting: *Deleted

## 2014-06-29 DIAGNOSIS — D518 Other vitamin B12 deficiency anemias: Secondary | ICD-10-CM

## 2014-06-29 MED ORDER — CYANOCOBALAMIN 1000 MCG/ML IJ SOLN
1000.0000 ug | Freq: Once | INTRAMUSCULAR | Status: AC
  Administered 2014-06-29: 1000 ug via INTRAMUSCULAR

## 2014-07-04 ENCOUNTER — Other Ambulatory Visit: Payer: Self-pay

## 2014-07-04 DIAGNOSIS — E785 Hyperlipidemia, unspecified: Secondary | ICD-10-CM

## 2014-07-04 MED ORDER — ROSUVASTATIN CALCIUM 10 MG PO TABS: 10.0000 mg | ORAL_TABLET | ORAL | Status: DC

## 2014-08-31 ENCOUNTER — Telehealth: Payer: Self-pay | Admitting: Internal Medicine

## 2014-08-31 DIAGNOSIS — R269 Unspecified abnormalities of gait and mobility: Secondary | ICD-10-CM

## 2014-08-31 NOTE — Telephone Encounter (Signed)
OK ,see prior referral with D[facility]

## 2014-08-31 NOTE — Telephone Encounter (Signed)
Beverlee Nims from [facility]  need an updated Prescription for Physical Therapy for patient . Phone [phone]  Fax# [phone]

## 2014-09-21 ENCOUNTER — Ambulatory Visit (INDEPENDENT_AMBULATORY_CARE_PROVIDER_SITE_OTHER): Payer: 59 | Admitting: *Deleted

## 2014-09-21 DIAGNOSIS — E538 Deficiency of other specified B group vitamins: Secondary | ICD-10-CM

## 2014-09-21 MED ORDER — CYANOCOBALAMIN 1000 MCG/ML IJ SOLN
1000.0000 ug | Freq: Once | INTRAMUSCULAR | Status: AC
  Administered 2014-09-21: 1000 ug via INTRAMUSCULAR

## 2014-09-23 ENCOUNTER — Other Ambulatory Visit: Payer: Self-pay | Admitting: Internal Medicine

## 2014-11-08 ENCOUNTER — Encounter: Payer: 59 | Admitting: Internal Medicine

## 2014-11-15 ENCOUNTER — Ambulatory Visit (INDEPENDENT_AMBULATORY_CARE_PROVIDER_SITE_OTHER): Payer: 59 | Admitting: Internal Medicine

## 2014-11-15 ENCOUNTER — Other Ambulatory Visit (INDEPENDENT_AMBULATORY_CARE_PROVIDER_SITE_OTHER): Payer: Medicare Other

## 2014-11-15 ENCOUNTER — Encounter: Payer: Self-pay | Admitting: Internal Medicine

## 2014-11-15 VITALS — BP 120/80 | HR 63 | Temp 97.5°F | Resp 14 | Ht 61.0 in | Wt 99.4 lb

## 2014-11-15 DIAGNOSIS — D518 Other vitamin B12 deficiency anemias: Secondary | ICD-10-CM | POA: Diagnosis not present

## 2014-11-15 DIAGNOSIS — M8580 Other specified disorders of bone density and structure, unspecified site: Secondary | ICD-10-CM

## 2014-11-15 DIAGNOSIS — Z139 Encounter for screening, unspecified: Secondary | ICD-10-CM

## 2014-11-15 DIAGNOSIS — Z0000 Encounter for general adult medical examination without abnormal findings: Secondary | ICD-10-CM

## 2014-11-15 DIAGNOSIS — E039 Hypothyroidism, unspecified: Secondary | ICD-10-CM | POA: Diagnosis not present

## 2014-11-15 DIAGNOSIS — E782 Mixed hyperlipidemia: Secondary | ICD-10-CM

## 2014-11-15 DIAGNOSIS — Z1389 Encounter for screening for other disorder: Secondary | ICD-10-CM

## 2014-11-15 DIAGNOSIS — M859 Disorder of bone density and structure, unspecified: Secondary | ICD-10-CM

## 2014-11-15 LAB — CBC WITH DIFFERENTIAL/PLATELET
BASOS PCT: 0.4 % (ref 0.0–3.0)
Basophils Absolute: 0 10*3/uL (ref 0.0–0.1)
EOS PCT: 11.7 % — AB (ref 0.0–5.0)
Eosinophils Absolute: 0.6 10*3/uL (ref 0.0–0.7)
HEMATOCRIT: 35.6 % — AB (ref 36.0–46.0)
Hemoglobin: 11.9 g/dL — ABNORMAL LOW (ref 12.0–15.0)
LYMPHS ABS: 1.7 10*3/uL (ref 0.7–4.0)
Lymphocytes Relative: 32.2 % (ref 12.0–46.0)
MCHC: 33.5 g/dL (ref 30.0–36.0)
MCV: 88.1 fl (ref 78.0–100.0)
Monocytes Absolute: 0.4 10*3/uL (ref 0.1–1.0)
Monocytes Relative: 8.2 % (ref 3.0–12.0)
NEUTROS PCT: 47.5 % (ref 43.0–77.0)
Neutro Abs: 2.5 10*3/uL (ref 1.4–7.7)
Platelets: 194 10*3/uL (ref 150.0–400.0)
RBC: 4.04 Mil/uL (ref 3.87–5.11)
RDW: 14.2 % (ref 11.5–15.5)
WBC: 5.3 10*3/uL (ref 4.0–10.5)

## 2014-11-15 LAB — URINALYSIS, ROUTINE W REFLEX MICROSCOPIC
BILIRUBIN URINE: NEGATIVE
Ketones, ur: NEGATIVE
LEUKOCYTES UA: NEGATIVE
Nitrite: NEGATIVE
SPECIFIC GRAVITY, URINE: 1.01 (ref 1.000–1.030)
TOTAL PROTEIN, URINE-UPE24: NEGATIVE
Urine Glucose: NEGATIVE
Urobilinogen, UA: 0.2 (ref 0.0–1.0)
pH: 6 (ref 5.0–8.0)

## 2014-11-15 MED ORDER — CYANOCOBALAMIN 1000 MCG/ML IJ SOLN
1000.0000 ug | Freq: Once | INTRAMUSCULAR | Status: AC
  Administered 2014-11-15: 1000 ug via INTRAMUSCULAR

## 2014-11-15 NOTE — Progress Notes (Signed)
Pre visit review using our clinic review tool, if applicable. No additional management support is needed unless otherwise documented below in the visit note. 

## 2014-11-15 NOTE — Assessment & Plan Note (Signed)
TSH 

## 2014-11-15 NOTE — Progress Notes (Signed)
Subjective:    Patient ID: Elizabeth [name], female    DOB: 02/19/1931, 80 y.o.   MRN: [id number]  HPI  Medicare Wellness Visit: Psychosocial and medical history were reviewed as required by Medicare (history related to abuse, antisocial behavior , firearm risk). Social history: Caffeine: 1.5 cups coffee/day Alcohol:  no Tobacco use:no Exercise: Rehab weekly, calesthentics daily Personal safety/fall risk:no Limitations of activities of daily living:no Seatbelt/ smoke alarm use:yes Healthcare Power of Attorney/Living Will status and End of Life process assessment : pending Ophthalmologic exam status: UTD Hearing evaluation status:not UTD Orientation: Oriented X 3 Memory and recall: good Spelling testing: good Depression/anxiety assessment: no Foreign travel history: never Immunization status for influenza/pneumonia/ shingles /tetanus: needs Shingles Transfusion history:no Preventive health care maintenance status: Colonoscopy/BMD/mammogram/Pap as per protocol/standard care:Gyn annually; She has declined colonoscopy;"I never needed one". Dental care:every 4 mos Chart reviewed and updated. Active issues reviewed and addressed as documented below.    Review of Systems  She has been compliant with her medications without adverse effects. The 10 mg of Crestor cost $60 a month.  Exercises as noted above. She has no cardiopulmonary symptoms with this.  She's on a heart healthy diet for the most part.  Significant headaches, epistaxis, chest pain, palpitations, exertional dyspnea, claudication, paroxysmal nocturnal dyspnea, or edema absent. No GI symptoms , memory loss or myalgias .   No significant change in weight; significant fatigue; sleep disorder; change in appetite. No blurred, double vision ,loss of vision No constipation; diarrhea;hoarseness;dysphagia No change in nails,hair,skin No numbness or tingling. History of essential tremor No temperature intolerance to heat ,cold  .  She continues a medication for overactive bladder with good response. She is seeing the Urologist who is performing acupuncture type treatments as well.She was recently treated for UTI; she is concerned she has residual infection.      Objective:   Physical Exam  Gen.: Thin but healthy and well-nourished in appearance. Alert, appropriate and cooperative throughout exam. Appears younger than stated age  Head: Normocephalic without obvious abnormalities  Eyes: No corneal or conjunctival inflammation noted. Pupils equal round reactive to light and accommodation. Extraocular motion intact.  Ears: External  ear exam reveals no significant lesions or deformities. Canals clear .TMs normal. Hearing is grossly decreased bilaterally. Nose: External nasal exam reveals no deformity or inflammation. Nasal mucosa are pink and moist. No lesions or exudates noted.   Mouth: Oral mucosa and oropharynx reveal no lesions or exudates. Teeth in good repair. Neck: No deformities, masses, or tenderness noted. Range of motion & Thyroid normal. Lungs: Normal respiratory effort; chest expands symmetrically. Lungs are clear to auscultation without rales, wheezes, or increased work of breathing. Heart: Normal rate and rhythm. Normal S1 and S2. No gallop, click, or rub. No murmur. Abdomen: Bowel sounds normal; abdomen soft and nontender. No masses, organomegaly or hernias noted. Genitalia: as per Gyn                                  Musculoskeletal/extremities:  Accentuated curvature of upper thoracic spine. No clubbing, cyanosis, or edema noted.  Range of motion normal . Tone & strength normal. Hand joints reveal significant mixed PIP/ DIP changes.  Fingernail  health good. Crepitus of knees  Able to lie down & sit up w/o help.  Negative SLR bilaterally Vascular: Carotid, radial artery, dorsalis pedis and  posterior tibial pulses are full and equal. No bruits present. Neurologic: Alert  and oriented x3. Deep  tendon reflexes symmetrical and normal.  Gait normal      Skin: Intact without suspicious lesions or rashes. Lymph: No cervical, axillary lymphadenopathy present. Psych: Mood and affect are normal. Normally interactive                                                                                      Assessment & Plan:  See Current Assessment & Plan in Problem List under specific DiagnosisThe labs will be reviewed and risks and options assessed. Written recommendations will be provided by mail or directly through My Chart.Further evaluation or change in medical therapy will be directed by those results.

## 2014-11-15 NOTE — Assessment & Plan Note (Signed)
Lipids, LFTs, TSH  

## 2014-11-15 NOTE — Patient Instructions (Signed)
Your next office appointment will be determined based upon review of your pending labs . Those instructions will be transmitted to you  by mail. 

## 2014-11-16 LAB — BASIC METABOLIC PANEL
BUN: 19 mg/dL (ref 6–23)
CALCIUM: 9.4 mg/dL (ref 8.4–10.5)
CO2: 24 mEq/L (ref 19–32)
Chloride: 105 mEq/L (ref 96–112)
Creatinine, Ser: 0.9 mg/dL (ref 0.4–1.2)
GFR: 67.18 mL/min (ref >60.00)
Glucose, Bld: 88 mg/dL (ref 70–99)
Potassium: 3.9 mEq/L (ref 3.5–5.1)
Sodium: 139 mEq/L (ref 135–145)

## 2014-11-16 LAB — HEPATIC FUNCTION PANEL
ALBUMIN: 4.1 g/dL (ref 3.5–5.2)
ALT: 28 U/L (ref 0–35)
AST: 35 U/L (ref 0–37)
Alkaline Phosphatase: 78 U/L (ref 39–117)
Bilirubin, Direct: 0.2 mg/dL (ref 0.0–0.3)
Total Bilirubin: 1 mg/dL (ref 0.2–1.2)
Total Protein: 7 g/dL (ref 6.0–8.3)

## 2014-11-16 LAB — LIPID PANEL
CHOLESTEROL: 160 mg/dL (ref 0–200)
HDL: 60.9 mg/dL (ref >39.00)
LDL Cholesterol: 77 mg/dL (ref 0–99)
NonHDL: 99.1
TRIGLYCERIDES: 111 mg/dL (ref 0.0–149.0)
Total CHOL/HDL Ratio: 3
VLDL: 22.2 mg/dL (ref 0.0–40.0)

## 2014-11-16 LAB — CK: CK TOTAL: 303 U/L — AB (ref 7–177)

## 2014-11-16 LAB — TSH: TSH: 2.86 u[IU]/mL (ref 0.35–4.50)

## 2014-11-16 LAB — VITAMIN D 25 HYDROXY (VIT D DEFICIENCY, FRACTURES): VITD: 51.11 ng/mL (ref 30.00–100.00)

## 2014-11-17 LAB — URINE CULTURE
Colony Count: NO GROWTH
Organism ID, Bacteria: NO GROWTH

## 2014-11-24 ENCOUNTER — Emergency Department (HOSPITAL_COMMUNITY)
Admission: EM | Admit: 2014-11-24 | Discharge: 2014-11-24 | Disposition: A | Discharge status: Home / Self Care | Payer: Medicare Other | Attending: Emergency Medicine | Admitting: Emergency Medicine

## 2014-11-24 ENCOUNTER — Encounter (HOSPITAL_COMMUNITY): Payer: Self-pay | Admitting: Emergency Medicine

## 2014-11-24 ENCOUNTER — Emergency Department (HOSPITAL_COMMUNITY): Payer: Medicare Other

## 2014-11-24 DIAGNOSIS — Y9289 Other specified places as the place of occurrence of the external cause: Secondary | ICD-10-CM | POA: Insufficient documentation

## 2014-11-24 DIAGNOSIS — E785 Hyperlipidemia, unspecified: Secondary | ICD-10-CM | POA: Insufficient documentation

## 2014-11-24 DIAGNOSIS — Z7982 Long term (current) use of aspirin: Secondary | ICD-10-CM | POA: Insufficient documentation

## 2014-11-24 DIAGNOSIS — M8580 Other specified disorders of bone density and structure, unspecified site: Secondary | ICD-10-CM | POA: Diagnosis not present

## 2014-11-24 DIAGNOSIS — E039 Hypothyroidism, unspecified: Secondary | ICD-10-CM | POA: Insufficient documentation

## 2014-11-24 DIAGNOSIS — S0990XA Unspecified injury of head, initial encounter: Secondary | ICD-10-CM

## 2014-11-24 DIAGNOSIS — Z862 Personal history of diseases of the blood and blood-forming organs and certain disorders involving the immune mechanism: Secondary | ICD-10-CM | POA: Insufficient documentation

## 2014-11-24 DIAGNOSIS — Y9389 Activity, other specified: Secondary | ICD-10-CM | POA: Diagnosis not present

## 2014-11-24 DIAGNOSIS — E538 Deficiency of other specified B group vitamins: Secondary | ICD-10-CM | POA: Diagnosis not present

## 2014-11-24 DIAGNOSIS — Z79899 Other long term (current) drug therapy: Secondary | ICD-10-CM | POA: Insufficient documentation

## 2014-11-24 DIAGNOSIS — Y998 Other external cause status: Secondary | ICD-10-CM | POA: Diagnosis not present

## 2014-11-24 DIAGNOSIS — W1812XA Fall from or off toilet with subsequent striking against object, initial encounter: Secondary | ICD-10-CM | POA: Diagnosis not present

## 2014-11-24 NOTE — ED Notes (Signed)
Patient experienced fall between the commode and the wall today. Hit head but no other injuries noted. Says "I just lost my balance." Has slight bruising around right eye-maroon colored. Has small abrasion on anterior, mid forehead between eyebrows. Denies lightheadedness or dizziness. Denies blurry vision. Patient drove herself here. Reports last fall was 2013. Not on blood thinners. Face symmetrical. RR even-unlabored. Ambulatory with steady gait.

## 2014-11-24 NOTE — ED Provider Notes (Signed)
CSN: [id number]     Arrival date & time 11/24/14  1208 History   First MD Initiated Contact with Patient 11/24/14 1327     Chief Complaint  Patient presents with  . Head Injury  . Fall     HPI Patient experienced fall between the commode and the wall yesterday. Hit head but no other injuries noted. Says "I just lost my balance." Has slight bruising around right eye-maroon colored. Has small abrasion on anterior, mid forehead between eyebrows. Denies lightheadedness or dizziness. Denies blurry vision. Patient drove herself here. Reports last fall was 2013. Not on blood thinners. Face symmetrical. RR even-unlabored. Ambulatory with steady gait. Past Medical History  Diagnosis Date  . Vitamin B12 deficiency   . Pernicious anemia   . Hypothyroidism   . Osteopenia     PMH fracture heel, wrist  . Hyperlipidemia   . Abnormal Pap smear of vagina 09/2006    Dr Nori Riis   Past Surgical History  Procedure Laterality Date  . Bladder suspension  2000  . No colonoscopy      "I never felt I needed one "   Family History  Problem Relation Age of Onset  . Breast cancer Maternal Grandmother   . Cancer Maternal Grandmother     breast  . Heart attack Maternal Grandfather     37s  . Emphysema Mother   . Hypertension Mother   . Alzheimer's disease Father   . Diabetes Neg Hx   . Stroke Neg Hx   . Parkinson's disease Neg Hx    History  Substance Use Topics  . Smoking status: Never Smoker   . Smokeless tobacco: Not on file  . Alcohol Use: Yes     Comment: wine very rarely   OB History    No data available     Review of Systems   All other systems reviewed and are negative   Allergies  Atorvastatin and Vesicare  Home Medications   Prior to Admission medications   Medication Sig Start Date End Date Taking? Authorizing Provider  aspirin 81 MG tablet Take 81 mg by mouth daily.     Yes Historical Provider, MD  B Complex Vitamins (VITAMIN B COMPLEX IJ) Inject as directed every 30  (thirty) days.   Yes Historical Provider, MD  Calcium Carbonate-Vitamin D (CALCIUM + D PO) Take 1 tablet by mouth every Monday, Wednesday, and Friday.   Yes Historical Provider, MD  cholecalciferol (VITAMIN D) 1000 UNITS tablet Take 1,000 Units by mouth daily.     Yes Historical Provider, MD  levothyroxine (SYNTHROID, LEVOTHROID) 25 MCG tablet Take 25 mcg by mouth daily before breakfast.   Yes Historical Provider, MD  mirabegron ER (MYRBETRIQ) 50 MG TB24 tablet Take 50 mg by mouth daily.   Yes Historical Provider, MD  rosuvastatin (CRESTOR) 10 MG tablet Take 1 tablet (10 mg total) by mouth as directed. 1 pill M,W,F 07/04/14  Yes Hendricks Limes, MD  vitamin E 400 UNIT capsule Take 400 Units by mouth daily.    Yes Historical Provider, MD  celecoxib (CELEBREX) 200 MG capsule Take 1 capsule (200 mg total) by mouth daily as needed for pain. Patient not taking: Reported on 11/24/2014 03/22/12   Hendricks Limes, MD  levothyroxine (SYNTHROID, LEVOTHROID) 25 MCG tablet TAKE 1 TABLET DAILY. Patient not taking: Reported on 11/24/2014 09/25/14   Hendricks Limes, MD  mirabegron ER (MYRBETRIQ) 25 MG TB24 Take 1 tablet (25 mg total) by mouth daily. Patient not taking:  Reported on 11/24/2014 10/08/12   Hendricks Limes, MD   BP 91/79 mmHg  Pulse 67  Temp(Src) 97.5 F (36.4 C) (Oral)  Resp 18  SpO2 100% Physical Exam Physical Exam  Nursing note and vitals reviewed. Constitutional: She is oriented to person, place, and time. She appears well-developed and well-nourished. No distress.  HENT:  Head: Normocephalic and atraumatic.  Eyes: Pupils are equal, round, and reactive to light.  Some bruising noted along the medial aspect of both eyes.  Small very superficial laceration which is closed on the 4 head.  Laceration measures 2-3 mm. Nose: No deformity or step off or swelling noted on the nose.   Neck: Normal range of motion.  Cardiovascular: Normal rate and intact distal pulses.   Pulmonary/Chest: No  respiratory distress.  Abdominal: Normal appearance. She exhibits no distension.  Musculoskeletal: Normal range of motion.  Neurological: She is alert and oriented to person, place, and time. No cranial nerve deficit.  Skin: Skin is warm and dry. No rash noted.  Psychiatric: She has a normal mood and affect. Her behavior is normal.   ED Course  Procedures (including critical care time) Labs Review Labs Reviewed - No data to display  Imaging Review Ct Head Wo Contrast  11/24/2014   CLINICAL DATA:  [address] and hit head today. Bruising around the right eye.  EXAM: CT HEAD WITHOUT CONTRAST  TECHNIQUE: Contiguous axial images were obtained from the base of the skull through the vertex without intravenous contrast.  COMPARISON:  05/21/2012  FINDINGS: Stable age related cerebral atrophy, ventriculomegaly and periventricular white matter disease. There are remote infarcts in the right posterior parietal area and also in the left caudate area. No new/acute intracranial findings. No extra-axial fluid collections. The brainstem and cerebellum are grossly normal.  No acute skull fracture. No upper facial bone fractures. The paranasal sinuses and mastoid air cells are clear. The globes are intact.  IMPRESSION: Age related cerebral atrophy, ventriculomegaly and periventricular white matter disease. There are remote infarctions but no acute intracranial findings.  No skull or upper facial bone fractures.   Electronically Signed   By: Kalman Jewels M.D.   On: 11/24/2014 13:50    After treatment in the ED the patient feels back to baseline and wants to go home.  MDM   Final diagnoses:  Head injuries        Dot Lanes, MD 11/24/14 1407

## 2014-11-24 NOTE — Discharge Instructions (Signed)
Head Injury  You have a head injury. Headaches and throwing up (vomiting) are common after a head injury. It should be easy to wake up from sleeping. Sometimes you must stay in the hospital. Most problems happen within the first 24 hours. Side effects may occur up to 7-10 days after the injury.   WHAT ARE THE TYPES OF HEAD INJURIES?  Head injuries can be as minor as a bump. Some head injuries can be more severe. More severe head injuries include:  · A jarring injury to the brain (concussion).  · A bruise of the brain (contusion). This mean there is bleeding in the brain that can cause swelling.  · A cracked skull (skull fracture).  · Bleeding in the brain that collects, clots, and forms a bump (hematoma).  WHEN SHOULD I GET HELP RIGHT AWAY?   · You are confused or sleepy.  · You cannot be woken up.  · You feel sick to your stomach (nauseous) or keep throwing up (vomiting).  · Your dizziness or unsteadiness is getting worse.  · You have very bad, lasting headaches that are not helped by medicine. Take medicines only as told by your doctor.  · You cannot use your arms or legs like normal.  · You cannot walk.  · You notice changes in the black spots in the center of the colored part of your eye (pupil).  · You have clear or bloody fluid coming from your nose or ears.  · You have trouble seeing.  During the next 24 hours after the injury, you must stay with someone who can watch you. This person should get help right away (call 911 in the [place] if you start to shake and are not able to control it (have seizures), you pass out, or you are unable to wake up.  HOW CAN I PREVENT A HEAD INJURY IN THE FUTURE?  · Wear seat belts.  · Wear a helmet while bike riding and playing sports like football.  · Stay away from dangerous activities around the house.  WHEN CAN I RETURN TO NORMAL ACTIVITIES AND ATHLETICS?  See your doctor before doing these activities. You should not do normal activities or play contact sports until 1 week  after the following symptoms have stopped:  · Headache that does not go away.  · Dizziness.  · Poor attention.  · Confusion.  · Memory problems.  · Sickness to your stomach or throwing up.  · Tiredness.  · Fussiness.  · Bothered by bright lights or loud noises.  · Anxiousness or depression.  · Restless sleep.  MAKE SURE YOU:   · Understand these instructions.  · Will watch your condition.  · Will get help right away if you are not doing well or get worse.  Document Released: 10/23/2008 Document Revised: 03/27/2014 Document Reviewed: 07/18/2013  ExitCare® Patient Information ©2015 [employer]. This information is not intended to replace advice given to you by your health care provider. Make sure you discuss any questions you have with your health care provider.

## 2014-11-27 ENCOUNTER — Telehealth: Payer: Self-pay | Admitting: *Deleted

## 2014-11-27 ENCOUNTER — Telehealth: Payer: Self-pay | Admitting: Internal Medicine

## 2014-11-27 NOTE — Telephone Encounter (Signed)
[place] Night - Client TELEPHONE ADVICE RECORD [facility] Patient Name: Elizabeth [name] Gender: Female DOB: 01-06-1931 Age: 81 Y 9 M 20 D Return Phone Number: [phone] (Primary) Address: [address] City/State/Zip: [place] [address] Client [place] Night - Client Client Site [place] - Night Physician [place], [place] Type Call Call Type Triage / Clinical Relationship To Patient Self Return Phone Number [phone] (Primary) Chief Complaint Head Injury (non urgent symptom) Initial Comment Caller states she fell in the bathroom just now, has a gash between eyebrows. PreDisposition Call Doctor Nurse Assessment Nurse: Melina Modena, RN, Estill Bamberg Date/Time Eilene Ghazi Time): 11/24/2014 8:46:52 AM Confirm and document reason for call. If symptomatic, describe symptoms. ---Caller states she fell in the bathroom just now, has a gash between eyebrows. bleeding has now stopped; states she believes cut is deep Has the patient traveled out of the country within the last 30 days? ---Not Applicable Does the patient require triage? ---Yes Related visit to physician within the last 2 weeks? ---N/A Does the PT have any chronic conditions? (i.e. diabetes, asthma, etc.) ---No Guidelines Guideline Title Affirmed Question Affirmed Notes Nurse Date/Time (Eastern Time) Cuts and Lacerations Skin is split open or gaping (or length > 1/2 inch or 12 mm on the skin, 1/4 inch or 6 mm on the face) [place], RN, Estill Bamberg 11/24/2014 8:48:39 AM Disp. Time Eilene Ghazi Time) Disposition Final User 11/24/2014 8:52:35 AM Go to ED Now Yes Melina Modena, RN, Shelly Coss Understands: Yes Disagree/Comply: Comply Care Advice Given Per Guideline PLEASE NOTE: All timestamps contained within this report are represented as [place] Standard Time. CONFIDENTIALTY NOTICE: This fax transmission is intended only for the addressee. It contains information that is  legally privileged, confidential or otherwise protected from use or disclosure. If you are not the intended recipient, you are strictly prohibited from reviewing, disclosing, copying using or disseminating any of this information or taking any action in reliance on or regarding this information. If you have received this fax in error, please notify us immediately by telephone so that we can arrange for its return to [place]. Phone: [phone], Toll-Free: [phone], Fax: [phone] Page: 2 of 2 Call Id: [id number] Care Advice Given Per Guideline GO TO ED NOW: You need to be seen in the Emergency Department. Go to the ER at ___________ [place] now. Drive carefully. CLEANSING LACERATIONS: * Wash the wound briefly with soap and water before being seen. DRESSING: Cover with a sterile gauze or clean cloth until seen. CARE ADVICE given per Cuts and Lacerations (Adult) guideline. After Care Instructions Given Call Event Type User Date / Time Description Referrals [facility] - ED

## 2014-11-27 NOTE — Telephone Encounter (Signed)
Patient has been advised

## 2014-11-27 NOTE — Telephone Encounter (Signed)
Very good; no fractures or new changes or processes present

## 2014-11-27 NOTE — Telephone Encounter (Signed)
Patient had CAT scan done at [facility] on 1/1 from a fall.  She is requesting Dr. Linna Darner to look at it.

## 2014-12-01 DIAGNOSIS — R3915 Urgency of urination: Secondary | ICD-10-CM | POA: Diagnosis not present

## 2014-12-05 ENCOUNTER — Emergency Department (HOSPITAL_COMMUNITY)
Admission: EM | Admit: 2014-12-05 | Discharge: 2014-12-05 | Disposition: A | Discharge status: Home / Self Care | Payer: Medicare Other | Attending: Emergency Medicine | Admitting: Emergency Medicine

## 2014-12-05 ENCOUNTER — Encounter (HOSPITAL_COMMUNITY): Payer: Self-pay | Admitting: Emergency Medicine

## 2014-12-05 DIAGNOSIS — E538 Deficiency of other specified B group vitamins: Secondary | ICD-10-CM | POA: Insufficient documentation

## 2014-12-05 DIAGNOSIS — Z7982 Long term (current) use of aspirin: Secondary | ICD-10-CM | POA: Insufficient documentation

## 2014-12-05 DIAGNOSIS — Z79899 Other long term (current) drug therapy: Secondary | ICD-10-CM | POA: Diagnosis not present

## 2014-12-05 DIAGNOSIS — Z8739 Personal history of other diseases of the musculoskeletal system and connective tissue: Secondary | ICD-10-CM | POA: Insufficient documentation

## 2014-12-05 DIAGNOSIS — K5900 Constipation, unspecified: Secondary | ICD-10-CM | POA: Insufficient documentation

## 2014-12-05 DIAGNOSIS — E785 Hyperlipidemia, unspecified: Secondary | ICD-10-CM | POA: Insufficient documentation

## 2014-12-05 DIAGNOSIS — E039 Hypothyroidism, unspecified: Secondary | ICD-10-CM | POA: Diagnosis not present

## 2014-12-05 DIAGNOSIS — Z862 Personal history of diseases of the blood and blood-forming organs and certain disorders involving the immune mechanism: Secondary | ICD-10-CM | POA: Insufficient documentation

## 2014-12-05 MED ORDER — POLYETHYLENE GLYCOL 3350 17 G PO PACK
17.0000 g | PACK | Freq: Every day | ORAL | Status: DC
Start: 2014-12-05 — End: 2015-06-07

## 2014-12-05 NOTE — Discharge Instructions (Signed)

## 2014-12-05 NOTE — ED Provider Notes (Signed)
CSN: [id number]     Arrival date & time 12/05/14  1752 History   First MD Initiated Contact with Patient 12/05/14 1807     No chief complaint on file.    (Consider location/radiation/quality/duration/timing/severity/associated sxs/prior Treatment) Patient is a 81 y.o. [name] presenting with constipation. The history is provided by the patient.  Constipation Severity:  Mild Time since last bowel movement: 2-3 days. Timing:  Constant Progression:  Unchanged Chronicity:  New Context comment:  Unknown Stool description:  None produced Unusual stool frequency:  Daily Relieved by:  Nothing Worsened by:  Nothing tried Ineffective treatments: something like miralax. Associated symptoms: no abdominal pain, no back pain, no diarrhea, no dysuria, no fever, no nausea and no vomiting     Past Medical History  Diagnosis Date  . Vitamin B12 deficiency   . Pernicious anemia   . Hypothyroidism   . Osteopenia     PMH fracture heel, wrist  . Hyperlipidemia   . Abnormal Pap smear of vagina 09/2006    Dr Nori Riis   Past Surgical History  Procedure Laterality Date  . Bladder suspension  2000  . No colonoscopy      "I never felt I needed one "   Family History  Problem Relation Age of Onset  . Breast cancer Maternal Grandmother   . Cancer Maternal Grandmother     breast  . Heart attack Maternal Grandfather     22s  . Emphysema Mother   . Hypertension Mother   . Alzheimer's disease Father   . Diabetes Neg Hx   . Stroke Neg Hx   . Parkinson's disease Neg Hx    History  Substance Use Topics  . Smoking status: Never Smoker   . Smokeless tobacco: Not on file  . Alcohol Use: Yes     Comment: wine very rarely   OB History    No data available     Review of Systems  Constitutional: Negative for fever and fatigue.  HENT: Negative for congestion and drooling.   Eyes: Negative for pain.  Respiratory: Negative for cough and shortness of breath.   Cardiovascular: Negative for chest  pain.  Gastrointestinal: Positive for constipation. Negative for nausea, vomiting, abdominal pain and diarrhea.  Genitourinary: Negative for dysuria and hematuria.  Musculoskeletal: Negative for back pain, gait problem and neck pain.  Skin: Negative for color change.  Neurological: Negative for dizziness and headaches.  Hematological: Negative for adenopathy.  Psychiatric/Behavioral: Negative for behavioral problems.  All other systems reviewed and are negative.     Allergies  Atorvastatin and Vesicare  Home Medications   Prior to Admission medications   Medication Sig Start Date End Date Taking? Authorizing Provider  aspirin 81 MG tablet Take 81 mg by mouth daily.     Yes Historical Provider, MD  Calcium Carbonate-Vitamin D (CALCIUM + D PO) Take 1 tablet by mouth daily.    Yes Historical Provider, MD  cholecalciferol (VITAMIN D) 1000 UNITS tablet Take 1,000 Units by mouth daily.     Yes Historical Provider, MD  Cyanocobalamin (VITAMIN B-12 IJ) Inject as directed.   Yes Historical Provider, MD  levothyroxine (SYNTHROID, LEVOTHROID) 25 MCG tablet Take 25 mcg by mouth daily before breakfast.   Yes Historical Provider, MD  mirabegron ER (MYRBETRIQ) 50 MG TB24 tablet Take 50 mg by mouth daily.   Yes Historical Provider, MD  rosuvastatin (CRESTOR) 10 MG tablet Take 1 tablet (10 mg total) by mouth as directed. 1 pill M,W,F 07/04/14  Yes Gwyndolyn Saxon  Chanda Busing, MD  vitamin E 400 UNIT capsule Take 400 Units by mouth daily.    Yes Historical Provider, MD  B Complex Vitamins (VITAMIN B COMPLEX IJ) Inject as directed every 30 (thirty) days.    Historical Provider, MD  celecoxib (CELEBREX) 200 MG capsule Take 1 capsule (200 mg total) by mouth daily as needed for pain. Patient not taking: Reported on 11/24/2014 03/22/12   Hendricks Limes, MD  levothyroxine (SYNTHROID, LEVOTHROID) 25 MCG tablet TAKE 1 TABLET DAILY. Patient not taking: Reported on 11/24/2014 09/25/14   Hendricks Limes, MD  mirabegron ER  (MYRBETRIQ) 25 MG TB24 Take 1 tablet (25 mg total) by mouth daily. Patient not taking: Reported on 12/05/2014 10/08/12   Hendricks Limes, MD   BP 165/Elizabeth mmHg  Pulse 78  Temp(Src) 98 F (36.7 C) (Oral)  Resp 16  SpO2 100% Physical Exam  Constitutional: She is oriented to person, place, and time. She appears well-developed and well-nourished.  HENT:  Head: Normocephalic.  Mouth/Throat: Oropharynx is clear and moist. No oropharyngeal exudate.  Eyes: Conjunctivae and EOM are normal. Pupils are equal, round, and reactive to light.  Neck: Normal range of motion. Neck supple.  Cardiovascular: Normal rate, regular rhythm, normal heart sounds and intact distal pulses.  Exam reveals no gallop and no friction rub.   No murmur heard. Pulmonary/Chest: Effort normal and breath sounds normal. No respiratory distress. She has no wheezes.  Abdominal: Soft. Bowel sounds are normal. There is no tenderness. There is no rebound and no guarding.  Genitourinary:  Normal appearing external rectum. Hard stool noted in rectal vault and rectal disimpaction performed by me.   Musculoskeletal: Normal range of motion. She exhibits no edema or tenderness.  Neurological: She is alert and oriented to person, place, and time.  Skin: Skin is warm and dry.  Psychiatric: She has a normal mood and affect. Her behavior is normal.  Nursing note and vitals reviewed.   ED Course  Fecal disimpaction Date/Time: 12/05/2014 7:01 PM Performed by: Pamella Pert Authorized by: Pamella Pert Consent: Verbal consent obtained. Written consent not obtained. Risks and benefits: risks, benefits and alternatives were discussed Consent given by: patient Patient understanding: patient states understanding of the procedure being performed Patient identity confirmed: verbally with patient, arm band, provided demographic data and hospital-assigned identification number Time out: Immediately prior to procedure a "time out" was  called to verify the correct patient, procedure, equipment, support staff and site/side marked as required. Preparation: Patient was prepped and draped in the usual sterile fashion. Local anesthesia used: no Patient sedated: no Patient tolerance: Patient tolerated the procedure well with no immediate complications   (including critical care time) Labs Review Labs Reviewed - No data to display  Imaging Review No results found.   EKG Interpretation None      MDM   Final diagnoses:  Constipation, unspecified constipation type    6:55 PM 81 y.o. [name] who presents with constipation for the last 2-3 days. She notes she has a normal stool frequency at baseline. She does not have a history of constipation. She denies any fevers, vomiting, or abdominal pain. She does complain of some rectal pain and was able to impact a small amount of stool. She states that she tried something like Mira lax but it did not help. On exam she has hard stool noted in rectal vault which was manually disimpacted by me. She had complete symptomatic relief after this. Will discharge home and recommend Mira lax twice  a day for the next few days.  6:59 PM:  I have discussed the diagnosis/risks/treatment options with the patient and believe the pt to be eligible for discharge home to follow-up with her pcp as needed. We also discussed returning to the ED immediately if new or worsening sx occur. We discussed the sx which are most concerning (e.g., abd pain, fever, vomiting, worsening constipation) that necessitate immediate return. Medications administered to the patient during their visit and any new prescriptions provided to the patient are listed below.  Medications given during this visit Medications - No data to display  New Prescriptions   No medications on file       Pamella Pert, MD 12/05/14 1902

## 2014-12-05 NOTE — ED Notes (Signed)
Pt reports constipation for the past several days. Pt feels urge to have a bowel movement, and tries to disimpact self, but continues to have pain. Lower abd pain. No vomiting

## 2014-12-08 DIAGNOSIS — N3941 Urge incontinence: Secondary | ICD-10-CM | POA: Diagnosis not present

## 2014-12-21 DIAGNOSIS — Z9181 History of falling: Secondary | ICD-10-CM | POA: Diagnosis not present

## 2014-12-21 DIAGNOSIS — M6281 Muscle weakness (generalized): Secondary | ICD-10-CM | POA: Diagnosis not present

## 2014-12-21 DIAGNOSIS — R262 Difficulty in walking, not elsewhere classified: Secondary | ICD-10-CM | POA: Diagnosis not present

## 2014-12-22 DIAGNOSIS — R350 Frequency of micturition: Secondary | ICD-10-CM | POA: Diagnosis not present

## 2014-12-22 DIAGNOSIS — N3941 Urge incontinence: Secondary | ICD-10-CM | POA: Diagnosis not present

## 2014-12-29 DIAGNOSIS — N3941 Urge incontinence: Secondary | ICD-10-CM | POA: Diagnosis not present

## 2014-12-29 DIAGNOSIS — R351 Nocturia: Secondary | ICD-10-CM | POA: Diagnosis not present

## 2014-12-29 DIAGNOSIS — N3946 Mixed incontinence: Secondary | ICD-10-CM | POA: Diagnosis not present

## 2014-12-29 DIAGNOSIS — R350 Frequency of micturition: Secondary | ICD-10-CM | POA: Diagnosis not present

## 2015-01-04 DIAGNOSIS — M6281 Muscle weakness (generalized): Secondary | ICD-10-CM | POA: Diagnosis not present

## 2015-01-04 DIAGNOSIS — R262 Difficulty in walking, not elsewhere classified: Secondary | ICD-10-CM | POA: Diagnosis not present

## 2015-01-04 DIAGNOSIS — Z9181 History of falling: Secondary | ICD-10-CM | POA: Diagnosis not present

## 2015-01-05 DIAGNOSIS — N3941 Urge incontinence: Secondary | ICD-10-CM | POA: Diagnosis not present

## 2015-01-05 DIAGNOSIS — R350 Frequency of micturition: Secondary | ICD-10-CM | POA: Diagnosis not present

## 2015-01-11 DIAGNOSIS — R262 Difficulty in walking, not elsewhere classified: Secondary | ICD-10-CM | POA: Diagnosis not present

## 2015-01-11 DIAGNOSIS — M6281 Muscle weakness (generalized): Secondary | ICD-10-CM | POA: Diagnosis not present

## 2015-01-11 DIAGNOSIS — Z9181 History of falling: Secondary | ICD-10-CM | POA: Diagnosis not present

## 2015-01-12 DIAGNOSIS — R350 Frequency of micturition: Secondary | ICD-10-CM | POA: Diagnosis not present

## 2015-01-12 DIAGNOSIS — N3941 Urge incontinence: Secondary | ICD-10-CM | POA: Diagnosis not present

## 2015-01-19 DIAGNOSIS — N3941 Urge incontinence: Secondary | ICD-10-CM | POA: Diagnosis not present

## 2015-01-26 DIAGNOSIS — R350 Frequency of micturition: Secondary | ICD-10-CM | POA: Diagnosis not present

## 2015-01-26 DIAGNOSIS — N3941 Urge incontinence: Secondary | ICD-10-CM | POA: Diagnosis not present

## 2015-02-01 DIAGNOSIS — Z9181 History of falling: Secondary | ICD-10-CM | POA: Diagnosis not present

## 2015-02-01 DIAGNOSIS — M6281 Muscle weakness (generalized): Secondary | ICD-10-CM | POA: Diagnosis not present

## 2015-02-01 DIAGNOSIS — R262 Difficulty in walking, not elsewhere classified: Secondary | ICD-10-CM | POA: Diagnosis not present

## 2015-02-02 DIAGNOSIS — R350 Frequency of micturition: Secondary | ICD-10-CM | POA: Diagnosis not present

## 2015-02-02 DIAGNOSIS — N3941 Urge incontinence: Secondary | ICD-10-CM | POA: Diagnosis not present

## 2015-02-05 ENCOUNTER — Telehealth: Payer: Self-pay

## 2015-02-05 NOTE — Telephone Encounter (Signed)
Patient states she "never" takes a flu shot and did not have one this season.

## 2015-02-08 DIAGNOSIS — M6281 Muscle weakness (generalized): Secondary | ICD-10-CM | POA: Diagnosis not present

## 2015-02-08 DIAGNOSIS — R262 Difficulty in walking, not elsewhere classified: Secondary | ICD-10-CM | POA: Diagnosis not present

## 2015-02-08 DIAGNOSIS — Z9181 History of falling: Secondary | ICD-10-CM | POA: Diagnosis not present

## 2015-02-09 DIAGNOSIS — R351 Nocturia: Secondary | ICD-10-CM | POA: Diagnosis not present

## 2015-02-09 DIAGNOSIS — R350 Frequency of micturition: Secondary | ICD-10-CM | POA: Diagnosis not present

## 2015-02-09 DIAGNOSIS — N3941 Urge incontinence: Secondary | ICD-10-CM | POA: Diagnosis not present

## 2015-02-09 DIAGNOSIS — N3946 Mixed incontinence: Secondary | ICD-10-CM | POA: Diagnosis not present

## 2015-02-15 DIAGNOSIS — M6281 Muscle weakness (generalized): Secondary | ICD-10-CM | POA: Diagnosis not present

## 2015-02-15 DIAGNOSIS — R262 Difficulty in walking, not elsewhere classified: Secondary | ICD-10-CM | POA: Diagnosis not present

## 2015-02-15 DIAGNOSIS — Z9181 History of falling: Secondary | ICD-10-CM | POA: Diagnosis not present

## 2015-03-02 DIAGNOSIS — N3941 Urge incontinence: Secondary | ICD-10-CM | POA: Diagnosis not present

## 2015-03-08 DIAGNOSIS — Z9181 History of falling: Secondary | ICD-10-CM | POA: Diagnosis not present

## 2015-03-08 DIAGNOSIS — R262 Difficulty in walking, not elsewhere classified: Secondary | ICD-10-CM | POA: Diagnosis not present

## 2015-03-08 DIAGNOSIS — M6281 Muscle weakness (generalized): Secondary | ICD-10-CM | POA: Diagnosis not present

## 2015-03-23 DIAGNOSIS — N3941 Urge incontinence: Secondary | ICD-10-CM | POA: Diagnosis not present

## 2015-03-29 DIAGNOSIS — M6281 Muscle weakness (generalized): Secondary | ICD-10-CM | POA: Diagnosis not present

## 2015-03-29 DIAGNOSIS — Z9181 History of falling: Secondary | ICD-10-CM | POA: Diagnosis not present

## 2015-03-29 DIAGNOSIS — R262 Difficulty in walking, not elsewhere classified: Secondary | ICD-10-CM | POA: Diagnosis not present

## 2015-04-05 ENCOUNTER — Ambulatory Visit: Payer: Medicare Other

## 2015-04-05 DIAGNOSIS — Z124 Encounter for screening for malignant neoplasm of cervix: Secondary | ICD-10-CM | POA: Diagnosis not present

## 2015-04-05 DIAGNOSIS — Z681 Body mass index (BMI) 19 or less, adult: Secondary | ICD-10-CM | POA: Diagnosis not present

## 2015-04-05 DIAGNOSIS — N760 Acute vaginitis: Secondary | ICD-10-CM | POA: Diagnosis not present

## 2015-04-05 DIAGNOSIS — N390 Urinary tract infection, site not specified: Secondary | ICD-10-CM | POA: Diagnosis not present

## 2015-04-12 ENCOUNTER — Ambulatory Visit: Payer: Medicare Other

## 2015-04-18 ENCOUNTER — Other Ambulatory Visit: Payer: Self-pay | Admitting: Internal Medicine

## 2015-04-19 ENCOUNTER — Ambulatory Visit: Payer: Medicare Other

## 2015-04-20 DIAGNOSIS — N3941 Urge incontinence: Secondary | ICD-10-CM | POA: Diagnosis not present

## 2015-04-20 DIAGNOSIS — R350 Frequency of micturition: Secondary | ICD-10-CM | POA: Diagnosis not present

## 2015-04-24 ENCOUNTER — Ambulatory Visit: Payer: Medicare Other

## 2015-04-27 ENCOUNTER — Ambulatory Visit (INDEPENDENT_AMBULATORY_CARE_PROVIDER_SITE_OTHER): Payer: Medicare Other

## 2015-04-27 DIAGNOSIS — E538 Deficiency of other specified B group vitamins: Secondary | ICD-10-CM | POA: Diagnosis not present

## 2015-04-27 MED ORDER — CYANOCOBALAMIN 1000 MCG/ML IJ SOLN
1000.0000 ug | Freq: Once | INTRAMUSCULAR | Status: AC
  Administered 2015-04-27: 1000 ug via INTRAMUSCULAR

## 2015-04-27 MED ORDER — CYANOCOBALAMIN 1000 MCG/ML IJ SOLN: 1000.0000 ug | Freq: Once | INTRAMUSCULAR | Status: DC

## 2015-05-11 DIAGNOSIS — N3941 Urge incontinence: Secondary | ICD-10-CM | POA: Diagnosis not present

## 2015-05-11 DIAGNOSIS — R350 Frequency of micturition: Secondary | ICD-10-CM | POA: Diagnosis not present

## 2015-05-17 ENCOUNTER — Other Ambulatory Visit: Payer: Self-pay | Admitting: Internal Medicine

## 2015-05-17 ENCOUNTER — Telehealth: Payer: Self-pay | Admitting: Internal Medicine

## 2015-05-17 DIAGNOSIS — R2689 Other abnormalities of gait and mobility: Secondary | ICD-10-CM

## 2015-05-17 NOTE — Telephone Encounter (Signed)
Patient did miss a month and would like to continue PT and they [facility] need a script. There fax is [phone]

## 2015-05-17 NOTE — Telephone Encounter (Signed)
Please advise 

## 2015-05-22 ENCOUNTER — Telehealth: Payer: Self-pay | Admitting: Internal Medicine

## 2015-05-22 NOTE — Telephone Encounter (Signed)
Dianna from [facility] stated that patient need and order for Physical Therapy, please advise [phone]

## 2015-05-23 NOTE — Telephone Encounter (Signed)
Patient has called in today.  Patient request that Dr. Linna Darner complete request as soon as possible.  States that [employer] number is [phone].  Patient is requesting call in regards once sent.

## 2015-05-23 NOTE — Telephone Encounter (Signed)
Sorry but I have ordered this multiple times; OV follow up needed to update status. Last seen 12/15

## 2015-05-23 NOTE — Telephone Encounter (Signed)
LVM for pt to call back and make a follow-up for status before going to PT

## 2015-05-23 NOTE — Telephone Encounter (Signed)
Please advise 

## 2015-05-24 ENCOUNTER — Ambulatory Visit (INDEPENDENT_AMBULATORY_CARE_PROVIDER_SITE_OTHER): Payer: Medicare Other | Admitting: Internal Medicine

## 2015-05-24 ENCOUNTER — Encounter: Payer: Self-pay | Admitting: Internal Medicine

## 2015-05-24 VITALS — BP 124/78 | HR 67 | Temp 97.7°F | Resp 18 | Wt 99.0 lb

## 2015-05-24 DIAGNOSIS — D518 Other vitamin B12 deficiency anemias: Secondary | ICD-10-CM | POA: Diagnosis not present

## 2015-05-24 DIAGNOSIS — R269 Unspecified abnormalities of gait and mobility: Secondary | ICD-10-CM

## 2015-05-24 DIAGNOSIS — G252 Other specified forms of tremor: Secondary | ICD-10-CM | POA: Diagnosis not present

## 2015-05-24 HISTORY — DX: Unspecified abnormalities of gait and mobility: R26.9

## 2015-05-24 MED ORDER — CYANOCOBALAMIN 1000 MCG/ML IJ SOLN
1000.0000 ug | Freq: Once | INTRAMUSCULAR | Status: AC
  Administered 2015-05-24: 1000 ug via INTRAMUSCULAR

## 2015-05-24 NOTE — Progress Notes (Signed)
Pre visit review using our clinic review tool, if applicable. No additional management support is needed unless otherwise documented below in the visit note. 

## 2015-05-24 NOTE — Patient Instructions (Signed)
The Physical Therapy & Neurology referrals will be scheduled and you'll be notified of the time.Please call the Referral Co-Ordinator @ [phone] if you have not been notified of appointment time within 7-10 days.

## 2015-05-24 NOTE — Progress Notes (Signed)
   Subjective:    Patient ID: Elizabeth [name], female    DOB: January 18, 1931, 81 y.o.   MRN: [id number]  HPI She is requesting additional physical therapy to improve strength and balance issues. She has not fallen since January of this year. She has noted significant improvement with physical therapy.  She states that she has chronic sensation of " lack of awareness" of her lower legs. She denies frank numbness, tingling, or weakness per se. She has no other neuromuscular symptoms except for what has been diagnosed as an intention tremor. This is bilateral but worse on the left. She states "I must hide my cards under the table when I play bridge" because of the tremor.  She does have B12 deficiency and receives supplementation parenterally.  Review of Systems Fever, chills, sweats, or unexplained weight loss not present. No significant headaches. Mental status change or memory loss denied. Blurred vision , diplopia or vision loss absent. Vertigo, near syncope or imbalance denied. Bladder incontinence being treated by Dr McDiarmid, Urology. Radicular type pain absent. No seizure stigmata.    Objective:   Physical Exam Pertinent or positive findings include: She appears much younger than her stated age due to plastic surgery. She has marked DIP osteoarthritic changes of the hands. The first heart sound is accentuated. There is some intention tremor but also at rest a slight pill-rolling component was suggested on the left. Her gait is short and choppy and she turns very slowly as well.  General appearance :adequately nourished; in no distress.  Eyes: No conjunctival inflammation or scleral icterus is present.  Oral exam:  Lips and gums are healthy appearing.There is no oropharyngeal erythema or exudate noted. Dental hygiene is good.  Heart:  Normal rate and regular rhythm. S2 is normal . No gallop, murmur, click, rub or other extra sounds    Lungs:Chest clear to auscultation; no wheezes,  rhonchi,rales ,or rubs present.No increased work of breathing.   Abdomen: bowel sounds normal, soft and non-tender without masses, organomegaly or hernias noted.  No guarding or rebound.   Vascular : all pulses equal ; no bruits present.  Skin:Warm & dry.  Intact without suspicious lesions or rashes ; no tenting or jaundice   Lymphatic: No lymphadenopathy is noted about the head, neck, axilla   Neuro: Strength, tone decreased; DTRs normal.        Assessment & Plan:  #1 gait abnormalities, multifactorial  #2 abnormal sensation in the lower extremities. This may be related to her B12 deficiency  #3 tremor; rule out intention versus Parkinsonian.  Plan: Renew physical therapy.  Referral to Dr. Carles Collet to rule out Parkinson's component to her clinical presentation.

## 2015-05-25 NOTE — Assessment & Plan Note (Signed)
Will receive shot today

## 2015-05-25 NOTE — Assessment & Plan Note (Signed)
Assessment by Dr Tat to R/O Parkinsonian component

## 2015-06-01 DIAGNOSIS — R350 Frequency of micturition: Secondary | ICD-10-CM | POA: Diagnosis not present

## 2015-06-01 DIAGNOSIS — N3941 Urge incontinence: Secondary | ICD-10-CM | POA: Diagnosis not present

## 2015-06-07 ENCOUNTER — Encounter: Payer: Self-pay | Admitting: Neurology

## 2015-06-07 ENCOUNTER — Ambulatory Visit (INDEPENDENT_AMBULATORY_CARE_PROVIDER_SITE_OTHER): Payer: Medicare Other | Admitting: Neurology

## 2015-06-07 ENCOUNTER — Other Ambulatory Visit (INDEPENDENT_AMBULATORY_CARE_PROVIDER_SITE_OTHER): Payer: Medicare Other

## 2015-06-07 VITALS — BP 104/68 | HR 76 | Ht 60.0 in | Wt 99.0 lb

## 2015-06-07 DIAGNOSIS — G2581 Restless legs syndrome: Secondary | ICD-10-CM | POA: Diagnosis not present

## 2015-06-07 DIAGNOSIS — D649 Anemia, unspecified: Secondary | ICD-10-CM | POA: Diagnosis not present

## 2015-06-07 DIAGNOSIS — G214 Vascular parkinsonism: Secondary | ICD-10-CM | POA: Diagnosis not present

## 2015-06-07 LAB — CBC WITH DIFFERENTIAL/PLATELET
Basophils Absolute: 0 10*3/uL (ref 0.0–0.1)
Basophils Relative: 0.2 % (ref 0.0–3.0)
Eosinophils Absolute: 0.8 10*3/uL — ABNORMAL HIGH (ref 0.0–0.7)
Eosinophils Relative: 14.7 % — ABNORMAL HIGH (ref 0.0–5.0)
HCT: 33 % — ABNORMAL LOW (ref 36.0–46.0)
Hemoglobin: 11 g/dL — ABNORMAL LOW (ref 12.0–15.0)
Lymphocytes Relative: 30.3 % (ref 12.0–46.0)
Lymphs Abs: 1.6 10*3/uL (ref 0.7–4.0)
MCHC: 33.4 g/dL (ref 30.0–36.0)
MCV: 86.4 fl (ref 78.0–100.0)
Monocytes Absolute: 0.5 10*3/uL (ref 0.1–1.0)
Monocytes Relative: 8.8 % (ref 3.0–12.0)
Neutro Abs: 2.4 10*3/uL (ref 1.4–7.7)
Neutrophils Relative %: 46 % (ref 43.0–77.0)
Platelets: 184 10*3/uL (ref 150.0–400.0)
RBC: 3.82 Mil/uL — ABNORMAL LOW (ref 3.87–5.11)
RDW: 15.1 % (ref 11.5–15.5)
WBC: 5.2 10*3/uL (ref 4.0–10.5)

## 2015-06-07 LAB — FERRITIN: Ferritin: 52.9 ng/mL (ref 10.0–291.0)

## 2015-06-07 MED ORDER — PRAMIPEXOLE DIHYDROCHLORIDE 0.125 MG PO TABS: 0.1250 mg | ORAL_TABLET | Freq: Every day | ORAL | Status: DC

## 2015-06-07 NOTE — Patient Instructions (Signed)
1. Start mirapex 0.125 mg - 1 tablet around 7 pm. Call us in a few weeks if you are still having restless leg symptoms.  2. Your provider has requested that you have labwork completed today. Please go to [facility] on the second floor of this building before leaving the office today. You do not need to check in. If you are not called within 15 minutes please check with the front desk.

## 2015-06-07 NOTE — Progress Notes (Signed)
Elizabeth [name] was seen today in the movement disorders clinic for neurologic consultation at the request of Unice Cobble, MD.  The consultation is for the evaluation of tremor and to r/o PD.  The tremor is in the L hand and is noted if she picks up something.  She is right hand dominant.  She has noted tremor for max of 2 years.  It really has not changed with time.  No family hx of tremor.  Denies leg tremor or head tremor.     Specific Symptoms:  Tremor: Yes.     Affected by caffeine:  No. (1 cup coffee per day)  Affected by alcohol:  No. (only drinks at christmas)  Affected by stress:  Yes.    Affected by fatigue:  Yes.    Spills soup if on spoon:  No. (involves non dominant hand)  Spills glass of liquid if full:  No. (involves non dominant hand)  Affects ADL's (tying shoes, brushing teeth, etc):  No.   By far her biggest c/o is inability to sit comfortably because of the need to move the legs when sitting.  "If I don't move them, I feel like I will scream."  It generally happens when she tries to watch TV.  She was told by her dermatologist that it was due to venous insufficiency.  If she gets up and moves around the sx's are better.  She can sit and play bridge and scrabble for hours without problem but cannot watch TV without getting up and moving around  Any other sx's:   Voice: no change Sleep: trouble getting asleep because legs are not comfortable; "part of it may be my nervous"  Vivid Dreams:  Yes.  , "I feel like my mother is in the room with me and she is deceased"  Acting out dreams:  No., but "I live by myself" Wet Pillows: No. Postural symptoms:  Yes.    Falls?  Yes.  ; went to ED on 12/13/14 after a fall between the commode and the wall and she hit her head.  She drove herself to the ED.  States that she had a similar fall in 2013. Bradykinesia symptoms: difficulty getting out of a chair Loss of smell:  No. Loss of taste:  No. Urinary Incontinence:  Yes.  , bladder  suspension 20 years ago but gotten worse again (on myrbetriq) Difficulty Swallowing:  No. Handwriting, micrographia: No. Trouble buttoning clothing: Yes.   Depression:  Yes.   (worse since retirement) Memory changes:  No. Hallucinations:  No.  visual distortions: No. N/V:  No. Lightheaded:  No.  Syncope: No. Diplopia:  No. Dyskinesia:  No.  Neuroimaging has previously been performed.  It is available for my review today.  Had a CT brain in the ED in Jan 2016 after a fall.  There was an old infact in the R occipito-parietal region and L caudate region; there was small vessel disease and atrophy.    ALLERGIES:   Allergies  Allergen Reactions  . Atorvastatin     REACTION: Felt funny (only way patient could describe)  . Vesicare [Solifenacin]     constipation    CURRENT MEDICATIONS:  Outpatient Encounter Prescriptions as of 06/07/2015  Medication Sig  . aspirin 81 MG tablet Take 81 mg by mouth daily.    . B Complex Vitamins (VITAMIN B COMPLEX IJ) Inject as directed every 30 (thirty) days.  . Calcium Carbonate-Vitamin D (CALCIUM + D PO) Take 1 tablet by  mouth daily.   . celecoxib (CELEBREX) 200 MG capsule Take 1 capsule (200 mg total) by mouth daily as needed for pain.  . cholecalciferol (VITAMIN D) 1000 UNITS tablet Take 1,000 Units by mouth daily.    . Cyanocobalamin (VITAMIN B-12 IJ) Inject as directed.  Marland Kitchen levothyroxine (SYNTHROID, LEVOTHROID) 25 MCG tablet TAKE 1 TABLET DAILY.  . mirabegron ER (MYRBETRIQ) 50 MG TB24 tablet Take 50 mg by mouth daily.  . rosuvastatin (CRESTOR) 10 MG tablet Take 1 tablet (10 mg total) by mouth as directed. 1 pill M,W,F  . vitamin E 400 UNIT capsule Take 400 Units by mouth daily.   . [DISCONTINUED] mirabegron ER (MYRBETRIQ) 25 MG TB24 Take 1 tablet (25 mg total) by mouth daily.  . [DISCONTINUED] polyethylene glycol (MIRALAX / GLYCOLAX) packet Take 17 g by mouth daily.   No facility-administered encounter medications on file as of 06/07/2015.     PAST MEDICAL HISTORY:   Past Medical History  Diagnosis Date  . Vitamin B12 deficiency   . Pernicious anemia   . Hypothyroidism   . Osteopenia     PMH fracture heel, wrist  . Hyperlipidemia   . Abnormal Pap smear of vagina 09/2006    Dr Nori Riis  . Post-polio syndrome     minor problems L leg    PAST SURGICAL HISTORY:   Past Surgical History  Procedure Laterality Date  . Bladder suspension  2000  . No colonoscopy      "I never felt I needed one "    SOCIAL HISTORY:   History   Social History  . Marital Status: Single    Spouse Name: N/A  . Number of Children: N/A  . Years of Education: N/A   Occupational History  . Not on file.   Social History Main Topics  . Smoking status: Never Smoker   . Smokeless tobacco: Not on file  . Alcohol Use: 0.0 oz/week    0 Standard drinks or equivalent per week     Comment: wine very rarely (at Christmas)  . Drug Use: No  . Sexual Activity: Not on file   Other Topics Concern  . Not on file   Social History Narrative    FAMILY HISTORY:   Family Status  Relation Status Death Age  . Maternal Grandfather Deceased 76  . Mother Deceased     emphysema, HTN  . Father Deceased     alzheimer's  . Brother Deceased     drown    ROS:  A complete 10 system review of systems was obtained and was unremarkable apart from what is mentioned above.  PHYSICAL EXAMINATION:    VITALS:   Filed Vitals:   06/07/15 1322  BP: 104/68  Pulse: 76  Height: 5' (1.524 m)  Weight: 99 lb (44.906 kg)    GEN:  The patient appears stated age and is in NAD. HEENT:  Normocephalic, atraumatic.  The mucous membranes are moist. The superficial temporal arteries are without ropiness or tenderness. CV:  RRR Lungs:  CTAB Neck/HEME:  There are no carotid bruits bilaterally.  Neurological examination:  Orientation: The patient is alert and oriented x3. Fund of knowledge is appropriate.  Recent and remote memory are intact.  Attention and  concentration are normal.    Able to name objects and repeat phrases. Cranial nerves: There is good facial symmetry. There is facial hypomimia.  Pupils are equal round and reactive to light bilaterally. Fundoscopic exam is attempted but the disc margins are not well  visualized bilaterally. Extraocular muscles are intact. The visual fields are full to confrontational testing. The speech is fluent and clear but hypophonic. Soft palate rises symmetrically and there is no tongue deviation. Hearing is intact to conversational tone. Sensation: Sensation is intact to light and pinprick throughout (facial, trunk, extremities). Vibration is intact at the bilateral  ankle. There is no extinction with double simultaneous stimulation. There is no sensory dermatomal level identified. Motor: Strength is 5/5 in the bilateral upper and lower extremities.   Shoulder shrug is equal and symmetric.  There is no pronator drift. Deep tendon reflexes: Deep tendon reflexes are 2-/4 at the bilateral biceps, triceps, brachioradialis, and patella.  Plantar responses are downgoing bilaterally.  Movement examination: Tone: There is normal tone in the bilateral upper extremities.  The tone in the lower extremities is  normal.  Abnormal movements:  No rest tremor, even with distraction procedures. There is very minimal tremor of the outstretched hands on the left with intention.  No significant postural tremor. She has no difficulty pouring water from one glass to another without spilling it.  She has mild difficulty with Archimedes spirals. Coordination:  There is some difficulty with hand opening and closing bilaterally and finger taps bilaterally, but that is primarily because of arthritis in the hands.  She has no difficulty with alternation of supination/pronation of the forearm, toe taps or heel taps bilaterally.  Gait and Station: The patient has minimal difficulty arising out of a deep-seated chair without the use of the hands.  She is able to properly arise after 2 attempts without the use of her hands. The patient's stride length is decreased with decreased arm swing bilaterally.    LABS  Lab Results  Component Value Date   TSH 2.86 11/15/2014     Chemistry      Component Value Date/Time   NA 139 11/15/2014 1537   K 3.9 11/15/2014 1537   CL 105 11/15/2014 1537   CO2 24 11/15/2014 1537   BUN 19 11/15/2014 1537   CREATININE 0.9 11/15/2014 1537      Component Value Date/Time   CALCIUM 9.4 11/15/2014 1537   ALKPHOS 78 11/15/2014 1537   AST 35 11/15/2014 1537   ALT 28 11/15/2014 1537   BILITOT 1.0 11/15/2014 1537     Lab Results  Component Value Date   WBC 5.3 11/15/2014   HGB 11.9* 11/15/2014   HCT 35.6* 11/15/2014   MCV 88.1 11/15/2014   PLT 194.0 11/15/2014     ASSESSMENT/PLAN:  1.  Tremor  -saw very little tremor today.  May have vascular parkinsonism but did not see any evidence of idiopathic PD today.  Discussed that patients who have vascular parkinsonism only respond to levodopa 30% of the time.  Pt states that tremor doesn't bother her and really not interested in any attempts at treatment right now.   2.  Restless leg syndrome  -Is by far her biggest complaint.  Will check CBC, iron, ferritin as has had anemia in past but unclear if iron deficient.  Could be etiology.  Has had normal TSH in setting of hypothyroidism (now being replaced)  -wants to try low dose mirapex.  Try when sits down to watch TV.  Will start just with 0.125 mg.  Will call me if doesn't work or has SE.  Risks, benefits, side effects and alternative therapies were discussed.  The opportunity to ask questions was given and they were answered to the best of my ability.  The patient  expressed understanding and willingness to follow the outlined treatment protocols. 3.  Follow up is anticipated in the next few months, sooner should new neurologic issues arise.

## 2015-06-08 ENCOUNTER — Telehealth: Payer: Self-pay | Admitting: Neurology

## 2015-06-08 NOTE — Telephone Encounter (Signed)
-----   Message from [place], DO sent at 06/08/2015  7:51 AM EDT ----- For some reason, it doesn't look like pts TIBC/iron was drawn?  You can let her know that she is still anemic.  She will need to address that part with Dr. Linna Darner and may need to have TIBC/iron stores redrawn

## 2015-06-08 NOTE — Telephone Encounter (Signed)
Spoke with patient and made her aware.

## 2015-06-21 DIAGNOSIS — M6281 Muscle weakness (generalized): Secondary | ICD-10-CM | POA: Diagnosis not present

## 2015-06-21 DIAGNOSIS — Z9181 History of falling: Secondary | ICD-10-CM | POA: Diagnosis not present

## 2015-06-21 DIAGNOSIS — R262 Difficulty in walking, not elsewhere classified: Secondary | ICD-10-CM | POA: Diagnosis not present

## 2015-06-22 ENCOUNTER — Ambulatory Visit (INDEPENDENT_AMBULATORY_CARE_PROVIDER_SITE_OTHER): Payer: Medicare Other

## 2015-06-22 DIAGNOSIS — D518 Other vitamin B12 deficiency anemias: Secondary | ICD-10-CM

## 2015-06-22 MED ORDER — CYANOCOBALAMIN 1000 MCG/ML IJ SOLN
1000.0000 ug | Freq: Once | INTRAMUSCULAR | Status: AC
  Administered 2015-06-22: 1000 ug via SUBCUTANEOUS

## 2015-07-05 DIAGNOSIS — M6281 Muscle weakness (generalized): Secondary | ICD-10-CM | POA: Diagnosis not present

## 2015-07-05 DIAGNOSIS — R262 Difficulty in walking, not elsewhere classified: Secondary | ICD-10-CM | POA: Diagnosis not present

## 2015-07-05 DIAGNOSIS — Z9181 History of falling: Secondary | ICD-10-CM | POA: Diagnosis not present

## 2015-07-19 ENCOUNTER — Ambulatory Visit (INDEPENDENT_AMBULATORY_CARE_PROVIDER_SITE_OTHER): Payer: Medicare Other

## 2015-07-19 DIAGNOSIS — D649 Anemia, unspecified: Secondary | ICD-10-CM | POA: Diagnosis not present

## 2015-07-19 MED ORDER — CYANOCOBALAMIN 1000 MCG/ML IJ SOLN
1000.0000 ug | Freq: Once | INTRAMUSCULAR | Status: AC
  Administered 2015-07-19: 1000 ug via INTRAMUSCULAR

## 2015-07-20 ENCOUNTER — Ambulatory Visit: Payer: Medicare Other

## 2015-08-14 DIAGNOSIS — M6281 Muscle weakness (generalized): Secondary | ICD-10-CM | POA: Diagnosis not present

## 2015-08-14 DIAGNOSIS — R262 Difficulty in walking, not elsewhere classified: Secondary | ICD-10-CM | POA: Diagnosis not present

## 2015-08-14 DIAGNOSIS — Z9181 History of falling: Secondary | ICD-10-CM | POA: Diagnosis not present

## 2015-08-23 ENCOUNTER — Ambulatory Visit (INDEPENDENT_AMBULATORY_CARE_PROVIDER_SITE_OTHER): Payer: Medicare Other

## 2015-08-23 DIAGNOSIS — E538 Deficiency of other specified B group vitamins: Secondary | ICD-10-CM | POA: Diagnosis not present

## 2015-08-23 MED ORDER — CYANOCOBALAMIN 1000 MCG/ML IJ SOLN
1000.0000 ug | Freq: Once | INTRAMUSCULAR | Status: AC
  Administered 2015-08-23: 1000 ug via INTRAMUSCULAR

## 2015-08-25 ENCOUNTER — Other Ambulatory Visit: Payer: Self-pay | Admitting: Internal Medicine

## 2015-09-04 DIAGNOSIS — R262 Difficulty in walking, not elsewhere classified: Secondary | ICD-10-CM | POA: Diagnosis not present

## 2015-09-04 DIAGNOSIS — M6281 Muscle weakness (generalized): Secondary | ICD-10-CM | POA: Diagnosis not present

## 2015-09-04 DIAGNOSIS — Z9181 History of falling: Secondary | ICD-10-CM | POA: Diagnosis not present

## 2015-09-13 ENCOUNTER — Ambulatory Visit (INDEPENDENT_AMBULATORY_CARE_PROVIDER_SITE_OTHER): Payer: Medicare Other | Admitting: Neurology

## 2015-09-13 ENCOUNTER — Other Ambulatory Visit (INDEPENDENT_AMBULATORY_CARE_PROVIDER_SITE_OTHER): Payer: Medicare Other

## 2015-09-13 ENCOUNTER — Encounter: Payer: Self-pay | Admitting: Neurology

## 2015-09-13 VITALS — BP 110/78 | HR 68 | Ht 60.0 in | Wt 97.0 lb

## 2015-09-13 DIAGNOSIS — D509 Iron deficiency anemia, unspecified: Secondary | ICD-10-CM

## 2015-09-13 DIAGNOSIS — G2581 Restless legs syndrome: Secondary | ICD-10-CM

## 2015-09-13 LAB — CBC WITH DIFFERENTIAL/PLATELET
BASOS PCT: 0.5 % (ref 0.0–3.0)
Basophils Absolute: 0 10*3/uL (ref 0.0–0.1)
EOS ABS: 0.2 10*3/uL (ref 0.0–0.7)
Eosinophils Relative: 4.9 % (ref 0.0–5.0)
HCT: 35.4 % — ABNORMAL LOW (ref 36.0–46.0)
Hemoglobin: 11.6 g/dL — ABNORMAL LOW (ref 12.0–15.0)
Lymphocytes Relative: 32.3 % (ref 12.0–46.0)
Lymphs Abs: 1.5 10*3/uL (ref 0.7–4.0)
MCHC: 32.8 g/dL (ref 30.0–36.0)
MCV: 87.1 fl (ref 78.0–100.0)
Monocytes Absolute: 0.4 10*3/uL (ref 0.1–1.0)
Monocytes Relative: 9.3 % (ref 3.0–12.0)
NEUTROS ABS: 2.5 10*3/uL (ref 1.4–7.7)
Neutrophils Relative %: 53 % (ref 43.0–77.0)
PLATELETS: 178 10*3/uL (ref 150.0–400.0)
RBC: 4.07 Mil/uL (ref 3.87–5.11)
RDW: 15.1 % (ref 11.5–15.5)
WBC: 4.7 10*3/uL (ref 4.0–10.5)

## 2015-09-13 LAB — IBC PANEL
IRON: 64 ug/dL (ref 42–145)
Saturation Ratios: 20.8 % (ref 20.0–50.0)
Transferrin: 220 mg/dL (ref 212.0–360.0)

## 2015-09-13 MED ORDER — PRAMIPEXOLE DIHYDROCHLORIDE 0.125 MG PO TABS: 0.1250 mg | ORAL_TABLET | Freq: Every day | ORAL | Status: DC

## 2015-09-13 NOTE — Patient Instructions (Signed)
1. Your provider has requested that you have labwork completed today. Please go to [facility] on the second floor of this building before leaving the office today. You do not need to check in. If you are not called within 15 minutes please check with the front desk.  2. 6 month follow up

## 2015-09-13 NOTE — Progress Notes (Signed)
Elizabeth [name] was seen today in the movement disorders clinic for neurologic consultation at the request of Unice Cobble, MD.  The consultation is for the evaluation of tremor and to r/o PD.  The tremor is in the L hand and is noted if she picks up something.  She is right hand dominant.  She has noted tremor for max of 2 years.  It really has not changed with time.  No family hx of tremor.  Denies leg tremor or head tremor.     Specific Symptoms:  Tremor: Yes.     Affected by caffeine:  No. (1 cup coffee per day)  Affected by alcohol:  No. (only drinks at christmas)  Affected by stress:  Yes.    Affected by fatigue:  Yes.    Spills soup if on spoon:  No. (involves non dominant hand)  Spills glass of liquid if full:  No. (involves non dominant hand)  Affects ADL's (tying shoes, brushing teeth, etc):  No.   By far her biggest c/o is inability to sit comfortably because of the need to move the legs when sitting.  "If I don't move them, I feel like I will scream."  It generally happens when she tries to watch TV.  She was told by her dermatologist that it was due to venous insufficiency.  If she gets up and moves around the sx's are better.  She can sit and play bridge and scrabble for hours without problem but cannot watch TV without getting up and moving around  Any other sx's:   Voice: no change Sleep: trouble getting asleep because legs are not comfortable; "part of it may be my nervous"  Vivid Dreams:  Yes.  , "I feel like my mother is in the room with me and she is deceased"  Acting out dreams:  No., but "I live by myself" Wet Pillows: No. Postural symptoms:  Yes.    Falls?  Yes.  ; went to ED on 12/13/14 after a fall between the commode and the wall and she hit her head.  She drove herself to the ED.  States that she had a similar fall in 2013. Bradykinesia symptoms: difficulty getting out of a chair Loss of smell:  No. Loss of taste:  No. Urinary Incontinence:  Yes.  , bladder  suspension 20 years ago but gotten worse again (on myrbetriq) Difficulty Swallowing:  No. Handwriting, micrographia: No. Trouble buttoning clothing: Yes.   Depression:  Yes.   (worse since retirement) Memory changes:  No. Hallucinations:  No.  visual distortions: No. N/V:  No. Lightheaded:  No.  Syncope: No. Diplopia:  No. Dyskinesia:  No.   09/13/15 update:  The patient returns today for follow-up.  Last visit, I started her on low-dose Mirapex, 0.125 mg for her restless leg. Pt states that the medication really helped and "I need it for the rest of my days."   She had some blood work and she was anemic with a hemoglobin of 11.  I had intended to have her iron stores/TIBC drawn but for some reason that was not done.  Neuroimaging has previously been performed.  It is available for my review today.  Had a CT brain in the ED in Jan 2016 after a fall.  There was an old infact in the R occipito-parietal region and L caudate region; there was small vessel disease and atrophy.    ALLERGIES:   Allergies  Allergen Reactions  . Atorvastatin  REACTION: Felt funny (only way patient could describe)  . Vesicare [Solifenacin]     constipation    CURRENT MEDICATIONS:  Outpatient Encounter Prescriptions as of 09/13/2015  Medication Sig  . aspirin 81 MG tablet Take 81 mg by mouth daily.    . Calcium Carbonate-Vitamin D (CALCIUM + D PO) Take 1 tablet by mouth daily.   . cholecalciferol (VITAMIN D) 1000 UNITS tablet Take 1,000 Units by mouth daily.    . CRESTOR 10 MG tablet TAKE (1) TABLET ON MONDAY, WEDNESDAY AND FRIDAY.  Marland Kitchen Cyanocobalamin (VITAMIN B-12 IJ) Inject as directed.  Marland Kitchen levothyroxine (SYNTHROID, LEVOTHROID) 25 MCG tablet TAKE 1 TABLET DAILY.  . Melatonin 3 MG CAPS Take by mouth at bedtime as needed and may repeat dose one time if needed.  . pramipexole (MIRAPEX) 0.125 MG tablet Take 1 tablet (0.125 mg total) by mouth at bedtime.  . vitamin E 400 UNIT capsule Take 400 Units by mouth  daily.   . [DISCONTINUED] B Complex Vitamins (VITAMIN B COMPLEX IJ) Inject as directed every 30 (thirty) days.  . celecoxib (CELEBREX) 200 MG capsule Take 1 capsule (200 mg total) by mouth daily as needed for pain. (Patient not taking: Reported on 09/13/2015)  . [DISCONTINUED] mirabegron ER (MYRBETRIQ) 50 MG TB24 tablet Take 50 mg by mouth daily.   No facility-administered encounter medications on file as of 09/13/2015.    PAST MEDICAL HISTORY:   Past Medical History  Diagnosis Date  . Vitamin B12 deficiency   . Pernicious anemia   . Hypothyroidism   . Osteopenia     PMH fracture heel, wrist  . Hyperlipidemia   . Abnormal Pap smear of vagina 09/2006    Dr Nori Riis  . Post-polio syndrome     minor problems L leg    PAST SURGICAL HISTORY:   Past Surgical History  Procedure Laterality Date  . Bladder suspension  2000  . No colonoscopy      "I never felt I needed one "    SOCIAL HISTORY:   Social History   Social History  . Marital Status: Single    Spouse Name: N/A  . Number of Children: N/A  . Years of Education: N/A   Occupational History  . Not on file.   Social History Main Topics  . Smoking status: Never Smoker   . Smokeless tobacco: Not on file  . Alcohol Use: 0.0 oz/week    0 Standard drinks or equivalent per week     Comment: wine very rarely (at Christmas)  . Drug Use: No  . Sexual Activity: Not on file   Other Topics Concern  . Not on file   Social History Narrative    FAMILY HISTORY:   Family Status  Relation Status Death Age  . Maternal Grandfather Deceased 51  . Mother Deceased     emphysema, HTN  . Father Deceased     alzheimer's  . Brother Deceased     drown    ROS:  A complete 10 system review of systems was obtained and was unremarkable apart from what is mentioned above.  PHYSICAL EXAMINATION:    VITALS:   Filed Vitals:   09/13/15 1103  BP: 110/78  Pulse: 68  Height: 5' (1.524 m)  Weight: 97 lb (43.999 kg)    GEN:  The  patient appears stated age and is in NAD. HEENT:  Normocephalic, atraumatic.  The mucous membranes are moist. The superficial temporal arteries are without ropiness or tenderness. CV:  RRR  Lungs:  CTAB Neck/HEME:  There are no carotid bruits bilaterally.  Neurological examination:  Orientation: The patient is alert and oriented x3.  Cranial nerves: There is good facial symmetry. There is facial hypomimia. The visual fields are full to confrontational testing. The speech is fluent and clear but hypophonic. Soft palate rises symmetrically and there is no tongue deviation. Hearing is intact to conversational tone. Sensation: Sensation is intact to light touch throughout Motor: Strength is 5/5 in the bilateral upper and lower extremities.   Shoulder shrug is equal and symmetric.  There is no pronator drift.   Movement examination: Tone: There is normal tone in the bilateral upper extremities.  The tone in the lower extremities is  normal.  Abnormal movements:  No rest tremor, even with distraction procedures. There is no tremor of the outstretched hands.  No significant postural tremor. She has no difficulty pouring water from one glass to another without spilling it.  She has mild difficulty with Archimedes spirals. Coordination:  There is some difficulty with hand opening and closing bilaterally and finger taps bilaterally, but that is primarily because of arthritis in the hands.  She has no difficulty with alternation of supination/pronation of the forearm, toe taps or heel taps bilaterally.  Gait and Station: The patient has minimal difficulty arising out of a deep-seated chair without the use of the hands.   LABS  Lab Results  Component Value Date   TSH 2.86 11/15/2014     Chemistry      Component Value Date/Time   NA 139 11/15/2014 1537   K 3.9 11/15/2014 1537   CL 105 11/15/2014 1537   CO2 24 11/15/2014 1537   BUN 19 11/15/2014 1537   CREATININE 0.9 11/15/2014 1537        Component Value Date/Time   CALCIUM 9.4 11/15/2014 1537   ALKPHOS 78 11/15/2014 1537   AST 35 11/15/2014 1537   ALT 28 11/15/2014 1537   BILITOT 1.0 11/15/2014 1537     Lab Results  Component Value Date   WBC 5.2 06/07/2015   HGB 11.0* 06/07/2015   HCT 33.0* 06/07/2015   MCV 86.4 06/07/2015   PLT 184.0 06/07/2015     ASSESSMENT/PLAN:  1.  Tremor  -saw no little tremor today and didn't see any last visit either.  May have vascular parkinsonism (doubt) but did not see any evidence of idiopathic PD today.  Discussed that patients who have vascular parkinsonism only respond to levodopa 30% of the time.  Pt states that tremor doesn't bother her and really not interested in any attempts at treatment right now.   2.  Restless leg syndrome  -Is much better with mirapex, 0.125 mg at night.  Will recheck CBC, and add iron/TIBC this time. 3.  Follow up is anticipated in the next few months, sooner should new neurologic issues arise.

## 2015-09-14 ENCOUNTER — Telehealth: Payer: Self-pay | Admitting: Neurology

## 2015-09-14 NOTE — Telephone Encounter (Signed)
Patient made aware of lab results.

## 2015-09-14 NOTE — Telephone Encounter (Signed)
-----   Message from [place], DO sent at 09/13/2015  5:35 PM EDT ----- Let pt know that while she is still anemic, that is stable (actually little better since last visit and not bad at all) and iron stores look better too.

## 2015-09-20 ENCOUNTER — Ambulatory Visit: Payer: Medicare Other

## 2015-09-20 DIAGNOSIS — R262 Difficulty in walking, not elsewhere classified: Secondary | ICD-10-CM | POA: Diagnosis not present

## 2015-09-20 DIAGNOSIS — M6281 Muscle weakness (generalized): Secondary | ICD-10-CM | POA: Diagnosis not present

## 2015-09-20 DIAGNOSIS — Z9181 History of falling: Secondary | ICD-10-CM | POA: Diagnosis not present

## 2015-09-21 DIAGNOSIS — L2089 Other atopic dermatitis: Secondary | ICD-10-CM | POA: Diagnosis not present

## 2015-09-25 ENCOUNTER — Ambulatory Visit: Payer: Medicare Other

## 2015-09-25 DIAGNOSIS — M6281 Muscle weakness (generalized): Secondary | ICD-10-CM | POA: Diagnosis not present

## 2015-09-25 DIAGNOSIS — R262 Difficulty in walking, not elsewhere classified: Secondary | ICD-10-CM | POA: Diagnosis not present

## 2015-09-25 DIAGNOSIS — Z9181 History of falling: Secondary | ICD-10-CM | POA: Diagnosis not present

## 2015-09-27 ENCOUNTER — Ambulatory Visit (INDEPENDENT_AMBULATORY_CARE_PROVIDER_SITE_OTHER): Payer: Medicare Other | Admitting: Emergency Medicine

## 2015-09-27 DIAGNOSIS — E538 Deficiency of other specified B group vitamins: Secondary | ICD-10-CM

## 2015-09-27 DIAGNOSIS — D519 Vitamin B12 deficiency anemia, unspecified: Secondary | ICD-10-CM | POA: Diagnosis not present

## 2015-09-27 MED ORDER — CYANOCOBALAMIN 1000 MCG/ML IJ SOLN
1000.0000 ug | INTRAMUSCULAR | Status: DC
  Administered 2015-09-27: 1000 ug via INTRAMUSCULAR

## 2015-10-02 DIAGNOSIS — R262 Difficulty in walking, not elsewhere classified: Secondary | ICD-10-CM | POA: Diagnosis not present

## 2015-10-02 DIAGNOSIS — Z9181 History of falling: Secondary | ICD-10-CM | POA: Diagnosis not present

## 2015-10-02 DIAGNOSIS — M6281 Muscle weakness (generalized): Secondary | ICD-10-CM | POA: Diagnosis not present

## 2015-10-05 ENCOUNTER — Ambulatory Visit: Payer: Medicare Other | Admitting: Neurology

## 2015-10-26 ENCOUNTER — Ambulatory Visit: Payer: Medicare Other

## 2015-11-02 ENCOUNTER — Ambulatory Visit: Payer: Medicare Other

## 2015-11-06 ENCOUNTER — Telehealth: Payer: Self-pay

## 2015-11-06 NOTE — Telephone Encounter (Signed)
Left Voice Mail for pt to call back.   RE: Flu Vaccine for 2016  

## 2015-11-12 ENCOUNTER — Telehealth: Payer: Self-pay | Admitting: Internal Medicine

## 2015-11-12 MED ORDER — LEVOTHYROXINE SODIUM 25 MCG PO TABS: 25.0000 ug | ORAL_TABLET | Freq: Every day | ORAL | Status: DC

## 2015-11-12 NOTE — Telephone Encounter (Signed)
Pt requesting refill for levothyroxine (SYNTHROID, LEVOTHROID) 25 MCG tablet [id number]  Pharmacy is [facility]

## 2015-11-20 ENCOUNTER — Ambulatory Visit (INDEPENDENT_AMBULATORY_CARE_PROVIDER_SITE_OTHER): Payer: Medicare Other

## 2015-11-20 DIAGNOSIS — E538 Deficiency of other specified B group vitamins: Secondary | ICD-10-CM | POA: Diagnosis not present

## 2015-11-20 MED ORDER — CYANOCOBALAMIN 1000 MCG/ML IJ SOLN
1000.0000 ug | Freq: Once | INTRAMUSCULAR | Status: AC
  Administered 2015-11-20: 1000 ug via INTRAMUSCULAR

## 2015-12-25 ENCOUNTER — Encounter: Payer: Self-pay | Admitting: Internal Medicine

## 2015-12-25 ENCOUNTER — Ambulatory Visit (INDEPENDENT_AMBULATORY_CARE_PROVIDER_SITE_OTHER): Payer: Medicare Other | Admitting: Internal Medicine

## 2015-12-25 VITALS — BP 130/76 | HR 61 | Temp 97.7°F | Resp 16 | Wt 97.0 lb

## 2015-12-25 DIAGNOSIS — D518 Other vitamin B12 deficiency anemias: Secondary | ICD-10-CM | POA: Diagnosis not present

## 2015-12-25 DIAGNOSIS — G2581 Restless legs syndrome: Secondary | ICD-10-CM | POA: Insufficient documentation

## 2015-12-25 DIAGNOSIS — E039 Hypothyroidism, unspecified: Secondary | ICD-10-CM

## 2015-12-25 DIAGNOSIS — E782 Mixed hyperlipidemia: Secondary | ICD-10-CM | POA: Diagnosis not present

## 2015-12-25 DIAGNOSIS — M1990 Unspecified osteoarthritis, unspecified site: Secondary | ICD-10-CM

## 2015-12-25 HISTORY — DX: Unspecified osteoarthritis, unspecified site: M19.90

## 2015-12-25 HISTORY — DX: Restless legs syndrome: G25.81

## 2015-12-25 MED ORDER — LEVOTHYROXINE SODIUM 25 MCG PO TABS: 25.0000 ug | ORAL_TABLET | Freq: Every day | ORAL | Status: DC

## 2015-12-25 MED ORDER — CYANOCOBALAMIN 1000 MCG/ML IJ SOLN
1000.0000 ug | INTRAMUSCULAR | Status: DC
  Administered 2015-12-25: 1000 ug via INTRAMUSCULAR

## 2015-12-25 NOTE — Patient Instructions (Signed)
  We have reviewed your prior records including labs and tests today.  Test(s) ordered today. Your results will be released to [facility] (or called to you) after review, usually within 72hours after test completion. If any changes need to be made, you will be notified at that same time.  Medications reviewed and updated.  No changes recommended at this time.  Your prescription(s) have been submitted to your pharmacy. Please take as directed and contact our office if you believe you are having problem(s) with the medication(s).  Please schedule followup in 6 months

## 2015-12-25 NOTE — Progress Notes (Signed)
Pre visit review using our clinic review tool, if applicable. No additional management support is needed unless otherwise documented below in the visit note. 

## 2015-12-25 NOTE — Assessment & Plan Note (Signed)
Check tsh  Titrate med dose if needed  

## 2015-12-25 NOTE — Progress Notes (Signed)
Subjective:    Patient ID: Elizabeth [name], female    DOB: Jan 22, 1931, 82 y.o.   MRN: [id number]  HPI She is here to establish with a new pcp. She has not concerns.    B12 def: she gets B12 injections monthly and needs one today.    RLS:  She follows with neurology and takes mirapex.  The mirapex has been helping.    Hypothyroidism:  She is taking her medication daily.  She denies any recent changes in energy or weight that are unexplained. Her appetite is normal.   Hyperlipidemia: She is taking her medication daily. She is compliant with a low fat/cholesterol diet. She is exercising regularly-doing physical therapy and working on increasing her strength. She denies myalgias.   Arthritis: she takes celebrex as needed only.  She denies pain on a daily basis and does not need any medication for it. She does have deformities in her hands and wrists.    She eats health and exercises daily.  She still drives, but does not drive at night.  She sometimes thinks about moving to an assisted living facility.  Medications and allergies reviewed with patient and updated if appropriate.  Patient Active Problem List   Diagnosis Date Noted  . Abnormality of gait 05/24/2015  . Intention tremor 12/08/2013  . Occipital infarction ([place]) 07/20/2012  . IRRITABLE BOWEL SYNDROME 01/17/2009  . CERVICALGIA 01/17/2009  . INCONTINENCE 09/07/2008  . ANEMIA, B12 DEFICIENCY 04/26/2008  . GERD 04/12/2008  . HYPERLIPIDEMIA 01/26/2008  . TIA 01/26/2008  . Hypothyroidism 01/04/2008  . OSTEOPENIA 01/04/2008    Current Outpatient Prescriptions on File Prior to Visit  Medication Sig Dispense Refill  . aspirin 81 MG tablet Take 81 mg by mouth daily.      . cholecalciferol (VITAMIN D) 1000 UNITS tablet Take 1,000 Units by mouth daily.      . CRESTOR 10 MG tablet TAKE (1) TABLET ON MONDAY, WEDNESDAY AND FRIDAY. 30 tablet 2  . Cyanocobalamin (VITAMIN B-12 IJ) Inject as directed.    Marland Kitchen levothyroxine (SYNTHROID,  LEVOTHROID) 25 MCG tablet Take 1 tablet (25 mcg total) by mouth daily. Patient must make appointment and keep it for further refills. 30 tablet 0  . pramipexole (MIRAPEX) 0.125 MG tablet Take 1 tablet (0.125 mg total) by mouth at bedtime. 90 tablet 1  . vitamin E 400 UNIT capsule Take 400 Units by mouth daily.     . Calcium Carbonate-Vitamin D (CALCIUM + D PO) Take 1 tablet by mouth daily. Reported on 12/25/2015    . celecoxib (CELEBREX) 200 MG capsule Take 1 capsule (200 mg total) by mouth daily as needed for pain. (Patient not taking: Reported on 09/13/2015) 30 each 0   No current facility-administered medications on file prior to visit.    Past Medical History  Diagnosis Date  . Vitamin B12 deficiency   . Pernicious anemia   . Hypothyroidism   . Osteopenia     PMH fracture heel, wrist  . Hyperlipidemia   . Abnormal Pap smear of vagina 09/2006    Dr Nori Riis  . Post-polio syndrome     minor problems L leg    Past Surgical History  Procedure Laterality Date  . Bladder suspension  2000  . No colonoscopy      "I never felt I needed one "    Social History   Social History  . Marital Status: Single    Spouse Name: N/A  . Number of Children: N/A  .  Years of Education: N/A   Social History Main Topics  . Smoking status: Never Smoker   . Smokeless tobacco: None  . Alcohol Use: 0.0 oz/week    0 Standard drinks or equivalent per week     Comment: wine very rarely (at Christmas)  . Drug Use: No  . Sexual Activity: Not Asked   Other Topics Concern  . None   Social History Narrative    Family History  Problem Relation Age of Onset  . Breast cancer Maternal Grandmother   . Cancer Maternal Grandmother     breast  . Heart attack Maternal Grandfather     28s  . Emphysema Mother   . Hypertension Mother   . Alzheimer's disease Father   . Diabetes Neg Hx   . Stroke Neg Hx   . Parkinson's disease Neg Hx     Review of Systems  Constitutional: Negative for fever and  appetite change.  Respiratory: Negative for cough, shortness of breath and wheezing.   Cardiovascular: Positive for leg swelling (mild). Negative for chest pain and palpitations.  Neurological: Negative for dizziness, light-headedness and headaches.       Objective:   Filed Vitals:   12/25/15 1318  BP: 130/76  Pulse: 61  Temp: 97.7 F (36.5 C)  Resp: 16   Filed Weights   12/25/15 1318  Weight: 97 lb (43.999 kg)   Body mass index is 18.94 kg/(m^2).   Physical Exam Constitutional: Appears well-developed and well-nourished. No distress.  Neck: Neck supple. No tracheal deviation present. No thyromegaly present.  No carotid bruit. No cervical adenopathy.   Cardiovascular: Normal rate, regular rhythm and normal heart sounds.   No murmur heard.  1+ edema Pulmonary/Chest: Effort normal and breath sounds normal. No respiratory distress. No wheezes.        Assessment & Plan:   See Problem List for Assessment and Plan of chronic medical problems.  Follow up in 6 months  She will get blood work done soon - probably next month

## 2015-12-25 NOTE — Assessment & Plan Note (Signed)
Following with neurology Mirapex is helping

## 2015-12-25 NOTE — Assessment & Plan Note (Signed)
Check lipid panel Continue crestor 10 mg daily especially given her history of stroke

## 2015-12-25 NOTE — Assessment & Plan Note (Signed)
No daily pain Taking Celebrex as needed

## 2015-12-25 NOTE — Assessment & Plan Note (Signed)
B12 injections monthly

## 2016-01-03 ENCOUNTER — Other Ambulatory Visit (INDEPENDENT_AMBULATORY_CARE_PROVIDER_SITE_OTHER): Payer: Medicare Other

## 2016-01-03 DIAGNOSIS — D518 Other vitamin B12 deficiency anemias: Secondary | ICD-10-CM

## 2016-01-03 DIAGNOSIS — E039 Hypothyroidism, unspecified: Secondary | ICD-10-CM | POA: Diagnosis not present

## 2016-01-03 DIAGNOSIS — E782 Mixed hyperlipidemia: Secondary | ICD-10-CM | POA: Diagnosis not present

## 2016-01-03 LAB — CBC WITH DIFFERENTIAL/PLATELET
Basophils Absolute: 0 10*3/uL (ref 0.0–0.1)
Basophils Relative: 0.4 % (ref 0.0–3.0)
EOS PCT: 4 % (ref 0.0–5.0)
Eosinophils Absolute: 0.2 10*3/uL (ref 0.0–0.7)
HCT: 33.4 % — ABNORMAL LOW (ref 36.0–46.0)
Hemoglobin: 11 g/dL — ABNORMAL LOW (ref 12.0–15.0)
LYMPHS ABS: 1.5 10*3/uL (ref 0.7–4.0)
Lymphocytes Relative: 33.7 % (ref 12.0–46.0)
MCHC: 32.9 g/dL (ref 30.0–36.0)
MCV: 85.6 fl (ref 78.0–100.0)
MONOS PCT: 10.2 % (ref 3.0–12.0)
Monocytes Absolute: 0.5 10*3/uL (ref 0.1–1.0)
NEUTROS ABS: 2.3 10*3/uL (ref 1.4–7.7)
NEUTROS PCT: 51.7 % (ref 43.0–77.0)
Platelets: 174 10*3/uL (ref 150.0–400.0)
RBC: 3.9 Mil/uL (ref 3.87–5.11)
RDW: 15.2 % (ref 11.5–15.5)
WBC: 4.5 10*3/uL (ref 4.0–10.5)

## 2016-01-03 LAB — COMPREHENSIVE METABOLIC PANEL
ALBUMIN: 4 g/dL (ref 3.5–5.2)
ALK PHOS: 82 U/L (ref 39–117)
ALT: 18 U/L (ref 0–35)
AST: 27 U/L (ref 0–37)
BUN: 17 mg/dL (ref 6–23)
CHLORIDE: 106 meq/L (ref 96–112)
CO2: 29 mEq/L (ref 19–32)
Calcium: 9.2 mg/dL (ref 8.4–10.5)
Creatinine, Ser: 0.8 mg/dL (ref 0.40–1.20)
GFR: 72.82 mL/min (ref >60.00)
GLUCOSE: 99 mg/dL (ref 70–99)
POTASSIUM: 3.7 meq/L (ref 3.5–5.1)
SODIUM: 141 meq/L (ref 135–145)
TOTAL PROTEIN: 6.7 g/dL (ref 6.0–8.3)
Total Bilirubin: 0.8 mg/dL (ref 0.2–1.2)

## 2016-01-03 LAB — LIPID PANEL
CHOLESTEROL: 144 mg/dL (ref 0–200)
HDL: 58.8 mg/dL (ref >39.00)
LDL Cholesterol: 59 mg/dL (ref 0–99)
NonHDL: 85.01
Total CHOL/HDL Ratio: 2
Triglycerides: 130 mg/dL (ref 0.0–149.0)
VLDL: 26 mg/dL (ref 0.0–40.0)

## 2016-01-03 LAB — TSH: TSH: 2.52 u[IU]/mL (ref 0.35–4.50)

## 2016-01-09 ENCOUNTER — Encounter: Payer: Self-pay | Admitting: Emergency Medicine

## 2016-01-18 ENCOUNTER — Telehealth: Payer: Self-pay | Admitting: Internal Medicine

## 2016-01-18 NOTE — Telephone Encounter (Signed)
Pt request to speak to the assistant concern about lab result. She has some question. Please call her before 12 pm today.

## 2016-01-18 NOTE — Telephone Encounter (Signed)
Spoke with pt. Schedule appt for 6 mo follow-up

## 2016-01-22 ENCOUNTER — Ambulatory Visit (INDEPENDENT_AMBULATORY_CARE_PROVIDER_SITE_OTHER): Payer: Medicare Other | Admitting: General Practice

## 2016-01-22 DIAGNOSIS — E539 Vitamin B deficiency, unspecified: Secondary | ICD-10-CM

## 2016-01-22 MED ORDER — CYANOCOBALAMIN 1000 MCG/ML IJ SOLN
1000.0000 ug | Freq: Once | INTRAMUSCULAR | Status: AC
  Administered 2016-01-22: 1000 ug via INTRAMUSCULAR

## 2016-02-20 ENCOUNTER — Ambulatory Visit (INDEPENDENT_AMBULATORY_CARE_PROVIDER_SITE_OTHER): Payer: Medicare Other | Admitting: Internal Medicine

## 2016-02-20 ENCOUNTER — Encounter: Payer: Self-pay | Admitting: Internal Medicine

## 2016-02-20 VITALS — BP 144/76 | HR 67 | Temp 97.7°F | Resp 16 | Wt 94.0 lb

## 2016-02-20 DIAGNOSIS — M25511 Pain in right shoulder: Secondary | ICD-10-CM

## 2016-02-20 DIAGNOSIS — M542 Cervicalgia: Secondary | ICD-10-CM | POA: Diagnosis not present

## 2016-02-20 DIAGNOSIS — R269 Unspecified abnormalities of gait and mobility: Secondary | ICD-10-CM | POA: Diagnosis not present

## 2016-02-20 DIAGNOSIS — D518 Other vitamin B12 deficiency anemias: Secondary | ICD-10-CM

## 2016-02-20 MED ORDER — CYANOCOBALAMIN 1000 MCG/ML IJ SOLN
1000.0000 ug | INTRAMUSCULAR | Status: DC
  Administered 2016-02-20: 1000 ug via INTRAMUSCULAR

## 2016-02-20 NOTE — Progress Notes (Signed)
Subjective:    Patient ID: Elizabeth [name], female    DOB: Feb 27, 1931, 82 y.o.   MRN: [id number]  HPI She is here for an acute visit.    B12 deficiency:  She is due for her B12 injection and will get one today.  She states low energy and her stamina is worse, but this is not new.    Last week she had a use a motorized cart at the grocery store and that was the first time.  She feels like she can not walk very far - it is getting worse.  She has gait abnormality and has a disability parking permit.  She needs that renewed today.  She has done PT in the past and it has helped.    Neck pain, shoulder pain:  She has left sided neck pain and arm pain that started 2 1/2 weeks ago.   She developed a stiff neck.  The left side of her neck hurts when she turns that way and she has difficulty turning her head.  She has not tried taking anything.  It is a little better. Her right shoulder has some pain with movement and decreased ROM.  The right shoulder has gotten a little better. She is not taking any celebrex.  She has not used heat.     Medications and allergies reviewed with patient and updated if appropriate.  Patient Active Problem List   Diagnosis Date Noted  . Restless leg syndrome 12/25/2015  . Arthritis 12/25/2015  . Abnormality of gait 05/24/2015  . Intention tremor 12/08/2013  . Occipital infarction ([place]) 07/20/2012  . IRRITABLE BOWEL SYNDROME 01/17/2009  . CERVICALGIA 01/17/2009  . INCONTINENCE 09/07/2008  . ANEMIA, B12 DEFICIENCY 04/26/2008  . GERD 04/12/2008  . HYPERLIPIDEMIA 01/26/2008  . TIA 01/26/2008  . Hypothyroidism 01/04/2008  . OSTEOPENIA 01/04/2008    Current Outpatient Prescriptions on File Prior to Visit  Medication Sig Dispense Refill  . aspirin 81 MG tablet Take 81 mg by mouth daily.      . Calcium Carbonate-Vitamin D (CALCIUM + D PO) Take 1 tablet by mouth daily. Reported on 12/25/2015    . celecoxib (CELEBREX) 200 MG capsule Take 1 capsule (200 mg total) by  mouth daily as needed for pain. 30 each 0  . cholecalciferol (VITAMIN D) 1000 UNITS tablet Take 1,000 Units by mouth daily.      . CRESTOR 10 MG tablet TAKE (1) TABLET ON MONDAY, WEDNESDAY AND FRIDAY. 30 tablet 2  . Cyanocobalamin (VITAMIN B-12 IJ) Inject as directed.    Marland Kitchen levothyroxine (SYNTHROID, LEVOTHROID) 25 MCG tablet Take 1 tablet (25 mcg total) by mouth daily. 90 tablet 1  . pramipexole (MIRAPEX) 0.125 MG tablet Take 1 tablet (0.125 mg total) by mouth at bedtime. 90 tablet 1  . vitamin E 400 UNIT capsule Take 400 Units by mouth daily.      Current Facility-Administered Medications on File Prior to Visit  Medication Dose Route Frequency Provider Last Rate Last Dose  . cyanocobalamin ((VITAMIN B-12)) injection 1,000 mcg  1,000 mcg Intramuscular Q30 days Binnie Rail, MD   1,000 mcg at 12/25/15 1330    Past Medical History  Diagnosis Date  . Vitamin B12 deficiency   . Pernicious anemia   . Hypothyroidism   . Osteopenia     PMH fracture heel, wrist  . Hyperlipidemia   . Abnormal Pap smear of vagina 09/2006    Dr Nori Riis  . Post-polio syndrome  minor problems L leg    Past Surgical History  Procedure Laterality Date  . Bladder suspension  2000  . No colonoscopy      "I never felt I needed one "    Social History   Social History  . Marital Status: Single    Spouse Name: N/A  . Number of Children: N/A  . Years of Education: N/A   Social History Main Topics  . Smoking status: Never Smoker   . Smokeless tobacco: Not on file  . Alcohol Use: 0.0 oz/week    0 Standard drinks or equivalent per week     Comment: wine very rarely (at Christmas)  . Drug Use: No  . Sexual Activity: Not on file   Other Topics Concern  . Not on file   Social History Narrative    Family History  Problem Relation Age of Onset  . Breast cancer Maternal Grandmother   . Cancer Maternal Grandmother     breast  . Heart attack Maternal Grandfather     33s  . Emphysema Mother   .  Hypertension Mother   . Alzheimer's disease Father   . Diabetes Neg Hx   . Stroke Neg Hx   . Parkinson's disease Neg Hx     Review of Systems  Constitutional: Positive for fever.       Decreased stamina  Musculoskeletal: Positive for arthralgias (right shoulder pain and slight decreased ROM), neck pain and neck stiffness.  Neurological: Negative for weakness and numbness.       Objective:   Filed Vitals:   02/20/16 1431  BP: 144/76  Pulse: 67  Temp: 97.7 F (36.5 C)  Resp: 16   Filed Weights   02/20/16 1431  Weight: 94 lb (42.638 kg)   Body mass index is 18.36 kg/(m^2).   Physical Exam Constitutional: frail elderly female. No distress.  Neck: Neck supple. No tracheal deviation present. No thyromegaly present.  No cervical adenopathy.   Cardiovascular: Normal rate, regular rhythm and normal heart sounds.   No murmur heard.  1+ LE edema Pulmonary/Chest: Effort normal and breath sounds normal. No respiratory distress. No wheezes.  MSK: left posterior and upper back pain along trapezius, increased pain with movement of neck and arms.  Mild right shoulder pain with palpation - pain with movement       Assessment & Plan:   Right shoulder pain Present for two weeks Improving without treatment Call try taking celebrex for several days Call or return if no improvement  See Problem List for Assessment and Plan of chronic medical problems.

## 2016-02-20 NOTE — Assessment & Plan Note (Addendum)
Acute pain Likely muscular in nature It is improving Take celebrex for a few days Will avoid a muscle relaxer due to high risk of falls Can use topical medication Has follow up appt with ortho next week

## 2016-02-20 NOTE — Assessment & Plan Note (Signed)
B12 injection today 

## 2016-02-20 NOTE — Assessment & Plan Note (Signed)
Has done PT in the past Decreasing stamina Will renew disability permit Should try to remain active and do balance exercises

## 2016-02-20 NOTE — Progress Notes (Signed)
Pre visit review using our clinic review tool, if applicable. No additional management support is needed unless otherwise documented below in the visit note. 

## 2016-02-20 NOTE — Patient Instructions (Addendum)
For your neck pain - take the celebrex daily for a few days.  Use heat on the neck.  You can try using an topical cream, such as Bengay or Aspercreme.   If your shoulder does not continue to improve let me know and we can have it evaluated.   You had a B12 injection today.    Your disability parking form was completed today.

## 2016-03-13 ENCOUNTER — Ambulatory Visit: Payer: Medicare Other | Admitting: Neurology

## 2016-03-13 ENCOUNTER — Ambulatory Visit (INDEPENDENT_AMBULATORY_CARE_PROVIDER_SITE_OTHER): Payer: Medicare Other | Admitting: Neurology

## 2016-03-13 ENCOUNTER — Encounter: Payer: Self-pay | Admitting: Neurology

## 2016-03-13 VITALS — BP 110/64 | HR 70 | Ht 60.0 in | Wt 94.0 lb

## 2016-03-13 DIAGNOSIS — G20 Parkinson's disease: Secondary | ICD-10-CM | POA: Diagnosis not present

## 2016-03-13 DIAGNOSIS — M542 Cervicalgia: Secondary | ICD-10-CM

## 2016-03-13 DIAGNOSIS — G2581 Restless legs syndrome: Secondary | ICD-10-CM | POA: Diagnosis not present

## 2016-03-13 MED ORDER — PRAMIPEXOLE DIHYDROCHLORIDE 0.125 MG PO TABS: 0.1250 mg | ORAL_TABLET | Freq: Every day | ORAL | Status: DC

## 2016-03-13 MED ORDER — CARBIDOPA-LEVODOPA 25-100 MG PO TABS: 1.0000 | ORAL_TABLET | Freq: Three times a day (TID) | ORAL | Status: DC

## 2016-03-13 NOTE — Progress Notes (Signed)
Elizabeth [name] was seen today in the movement disorders clinic for neurologic consultation at the request of Binnie Rail, MD.  The consultation is for the evaluation of tremor and to r/o PD.  The tremor is in the L hand and is noted if she picks up something.  She is right hand dominant.  She has noted tremor for max of 2 years.  It really has not changed with time.  No family hx of tremor.  Denies leg tremor or head tremor.     Specific Symptoms:  Tremor: Yes.     Affected by caffeine:  No. (1 cup coffee per day)  Affected by alcohol:  No. (only drinks at christmas)  Affected by stress:  Yes.    Affected by fatigue:  Yes.    Spills soup if on spoon:  No. (involves non dominant hand)  Spills glass of liquid if full:  No. (involves non dominant hand)  Affects ADL's (tying shoes, brushing teeth, etc):  No.   By far her biggest c/o is inability to sit comfortably because of the need to move the legs when sitting.  "If I don't move them, I feel like I will scream."  It generally happens when she tries to watch TV.  She was told by her dermatologist that it was due to venous insufficiency.  If she gets up and moves around the sx's are better.  She can sit and play bridge and scrabble for hours without problem but cannot watch TV without getting up and moving around  Any other sx's:   Voice: no change Sleep: trouble getting asleep because legs are not comfortable; "part of it may be my nervous"  Vivid Dreams:  Yes.  , "I feel like my mother is in the room with me and she is deceased"  Acting out dreams:  No., but "I live by myself" Wet Pillows: No. Postural symptoms:  Yes.    Falls?  Yes.  ; went to ED on 12/13/14 after a fall between the commode and the wall and she hit her head.  She drove herself to the ED.  States that she had a similar fall in 2013. Bradykinesia symptoms: difficulty getting out of a chair Loss of smell:  No. Loss of taste:  No. Urinary Incontinence:  Yes.  , bladder  suspension 20 years ago but gotten worse again (on myrbetriq) Difficulty Swallowing:  No. Handwriting, micrographia: No. Trouble buttoning clothing: Yes.   Depression:  Yes.   (worse since retirement) Memory changes:  No. Hallucinations:  No.  visual distortions: No. N/V:  No. Lightheaded:  No.  Syncope: No. Diplopia:  No. Dyskinesia:  No.   09/13/15 update:  The patient returns today for follow-up.  Last visit, I started her on low-dose Mirapex, 0.125 mg for her restless leg. Pt states that the medication really helped and "I need it for the rest of my days."   She had some blood work and she was anemic with a hemoglobin of 11.  I had intended to have her iron stores/TIBC drawn but for some reason that was not done.  Neuroimaging has previously been performed.  It is available for my review today.  Had a CT brain in the ED in Jan 2016 after a fall.  There was an old infact in the R occipito-parietal region and L caudate region; there was small vessel disease and atrophy.  03/13/16 update:  The patient is following up today and has a history  of restless leg syndrome.  She is on Mirapex, 0.125 mg.  She was slightly anemic when I checked her hemoglobin last October (1.6) and her primary care physician rechecked this in February and her hemoglobin was 11.0.  Overall, this has been stable for quite some time.  Her iron stores and iron saturation were good in October.  With the medication, she has been sleeping well.  Her biggest c/o is neck pain x several months.  She saw Dr. Noemi Chapel and states that she had a cortisone injection but that was in the right shoulder.  Sometimes, the pain radiates in the neck.  X rays were done of the cervical spine but no other neuroimaging.  No PT for the neck.       ALLERGIES:   Allergies  Allergen Reactions  . Atorvastatin     REACTION: Felt funny (only way patient could describe)  . Vesicare [Solifenacin]     constipation    CURRENT MEDICATIONS:    Outpatient Encounter Prescriptions as of 03/13/2016  Medication Sig  . aspirin 81 MG tablet Take 81 mg by mouth daily.    . Calcium Carbonate-Vitamin D (CALCIUM + D PO) Take 1 tablet by mouth daily. Reported on 12/25/2015  . cholecalciferol (VITAMIN D) 1000 UNITS tablet Take 1,000 Units by mouth daily.    . CRESTOR 10 MG tablet TAKE (1) TABLET ON MONDAY, WEDNESDAY AND FRIDAY.  Marland Kitchen Cyanocobalamin (VITAMIN B-12 IJ) Inject as directed.  Marland Kitchen levothyroxine (SYNTHROID, LEVOTHROID) 25 MCG tablet Take 1 tablet (25 mcg total) by mouth daily.  . pramipexole (MIRAPEX) 0.125 MG tablet Take 1 tablet (0.125 mg total) by mouth at bedtime.  . vitamin E 400 UNIT capsule Take 400 Units by mouth daily.   . [DISCONTINUED] celecoxib (CELEBREX) 200 MG capsule Take 1 capsule (200 mg total) by mouth daily as needed for pain.   Facility-Administered Encounter Medications as of 03/13/2016  Medication  . cyanocobalamin ((VITAMIN B-12)) injection 1,000 mcg  . cyanocobalamin ((VITAMIN B-12)) injection 1,000 mcg    PAST MEDICAL HISTORY:   Past Medical History  Diagnosis Date  . Vitamin B12 deficiency   . Pernicious anemia   . Hypothyroidism   . Osteopenia     PMH fracture heel, wrist  . Hyperlipidemia   . Abnormal Pap smear of vagina 09/2006    Dr Nori Riis  . Post-polio syndrome     minor problems L leg    PAST SURGICAL HISTORY:   Past Surgical History  Procedure Laterality Date  . Bladder suspension  2000  . No colonoscopy      "I never felt I needed one "    SOCIAL HISTORY:   Social History   Social History  . Marital Status: Single    Spouse Name: N/A  . Number of Children: N/A  . Years of Education: N/A   Occupational History  . Not on file.   Social History Main Topics  . Smoking status: Never Smoker   . Smokeless tobacco: Not on file  . Alcohol Use: 0.0 oz/week    0 Standard drinks or equivalent per week     Comment: wine very rarely (at Christmas)  . Drug Use: No  . Sexual Activity: Not  on file   Other Topics Concern  . Not on file   Social History Narrative    FAMILY HISTORY:   Family Status  Relation Status Death Age  . Maternal Grandfather Deceased 77  . Mother Deceased     emphysema, HTN  .  Father Deceased     alzheimer's  . Brother Deceased     drown    ROS:  A complete 10 system review of systems was obtained and was unremarkable apart from what is mentioned above.  PHYSICAL EXAMINATION:    VITALS:   Filed Vitals:   03/13/16 1235  BP: 110/64  Pulse: 70  Height: 5' (1.524 m)  Weight: 94 lb (42.638 kg)    GEN:  The patient appears stated age and is in NAD. HEENT:  Normocephalic, atraumatic.  The mucous membranes are moist. The superficial temporal arteries are without ropiness or tenderness. CV:  RRR Lungs:  CTAB Neck/HEME:  There are no carotid bruits bilaterally.  Neurological examination:  Orientation: The patient is alert and oriented x3.  Cranial nerves: There is good facial symmetry. There is facial hypomimia. The visual fields are full to confrontational testing. The speech is fluent and clear but hypophonic. Soft palate rises symmetrically and there is no tongue deviation. Hearing is intact to conversational tone. Sensation: Sensation is intact to light touch throughout Motor: Strength is 5/5 in the bilateral upper and lower extremities.   Shoulder shrug is equal and symmetric.  There is no pronator drift.   Movement examination: Tone: There is normal tone in the bilateral upper extremities.  The tone in the lower extremities is  normal.  Abnormal movements:  She has a L hand rest tremor today.   Coordination:  There is some difficulty with hand opening and closing bilaterally and finger taps more on the L. Gait and Station: The patient has minimal difficulty arising out of a deep-seated chair without the use of the hands.  She is short stepped.  She has L hand tremor with ambulation and slightly drags the L leg with ambulation (states  had polio as a child)  LABS  Lab Results  Component Value Date   TSH 2.52 01/03/2016     Chemistry      Component Value Date/Time   NA 141 01/03/2016 1502   K 3.7 01/03/2016 1502   CL 106 01/03/2016 1502   CO2 29 01/03/2016 1502   BUN 17 01/03/2016 1502   CREATININE 0.80 01/03/2016 1502      Component Value Date/Time   CALCIUM 9.2 01/03/2016 1502   ALKPHOS 82 01/03/2016 1502   AST 27 01/03/2016 1502   ALT 18 01/03/2016 1502   BILITOT 0.8 01/03/2016 1502     Lab Results  Component Value Date   WBC 4.5 01/03/2016   HGB 11.0* 01/03/2016   HCT 33.4* 01/03/2016   MCV 85.6 01/03/2016   PLT 174.0 01/03/2016     ASSESSMENT/PLAN:  1.  Tremor  -she looked more parkinsonian today than previous and given her c/o, I am going to trial her on carbidopa/levodopa 25/100 and work to tid dosing.  Risks, benefits, side effects and alternative therapies were discussed.  The opportunity to ask questions was given and they were answered to the best of my ability.  The patient expressed understanding and willingness to follow the outlined treatment protocols. 2.  Restless leg syndrome  -Is much better with mirapex, 0.125 mg at night.  I refilled that today 3.  Neck pain  -I'm sure that this is arthritic/degenerative.  Will order MRI of cervical spine to make sure no significant CCS before order PT but told her that PT is likely what she needs. 4.  Follow up is anticipated in the next 6 weeks

## 2016-03-13 NOTE — Patient Instructions (Addendum)
1. Start Carbidopa Levodopa as follows: 1/2 tab three times a day before meals x 1 wk, then 1/2 in am & noon & 1 in evening for a week, then 1/2 in am &1 at noon &one in evening for a week, then 1 tablet three times a day before meals. 2. We have scheduled you at [facility] for your MRI on 03/21/2016 at 3:00 pm. Please arrive 15 minutes prior and go to 1st floor radiology. If you need to reschedule for any reason please call [phone].

## 2016-03-21 ENCOUNTER — Ambulatory Visit (HOSPITAL_COMMUNITY): Payer: Medicare Other

## 2016-03-27 ENCOUNTER — Ambulatory Visit (HOSPITAL_COMMUNITY)
Admission: RE | Admit: 2016-03-27 | Discharge: 2016-03-27 | Disposition: A | Discharge status: Home / Self Care | Payer: Medicare Other | Source: Ambulatory Visit | Attending: Neurology | Admitting: Neurology

## 2016-03-27 DIAGNOSIS — M1288 Other specific arthropathies, not elsewhere classified, other specified site: Secondary | ICD-10-CM | POA: Insufficient documentation

## 2016-03-27 DIAGNOSIS — M542 Cervicalgia: Secondary | ICD-10-CM | POA: Diagnosis present

## 2016-03-27 DIAGNOSIS — M4802 Spinal stenosis, cervical region: Secondary | ICD-10-CM | POA: Insufficient documentation

## 2016-03-27 DIAGNOSIS — M2578 Osteophyte, vertebrae: Secondary | ICD-10-CM | POA: Insufficient documentation

## 2016-04-01 ENCOUNTER — Telehealth: Payer: Self-pay | Admitting: Neurology

## 2016-04-01 DIAGNOSIS — M542 Cervicalgia: Secondary | ICD-10-CM

## 2016-04-01 NOTE — Telephone Encounter (Signed)
-----   Message from Pieter Partridge, DO sent at 03/28/2016 12:03 PM EDT ----- MRI shows degenerative disc and arthritic changes.  Recommend referral to PT for neck pain.

## 2016-04-01 NOTE — Telephone Encounter (Signed)
Patient made aware. Referral placed.

## 2016-04-08 ENCOUNTER — Other Ambulatory Visit: Payer: Self-pay | Admitting: Internal Medicine

## 2016-04-15 ENCOUNTER — Ambulatory Visit: Payer: Medicare Other | Admitting: Physical Therapy

## 2016-04-24 ENCOUNTER — Encounter: Payer: Self-pay | Admitting: Neurology

## 2016-04-24 ENCOUNTER — Ambulatory Visit (INDEPENDENT_AMBULATORY_CARE_PROVIDER_SITE_OTHER): Payer: Medicare Other | Admitting: Neurology

## 2016-04-24 VITALS — BP 120/74 | HR 69 | Ht 60.0 in | Wt 99.0 lb

## 2016-04-24 DIAGNOSIS — G20 Parkinson's disease: Secondary | ICD-10-CM | POA: Diagnosis not present

## 2016-04-24 DIAGNOSIS — M5030 Other cervical disc degeneration, unspecified cervical region: Secondary | ICD-10-CM

## 2016-04-24 MED ORDER — PRAMIPEXOLE DIHYDROCHLORIDE 0.125 MG PO TABS: 0.1250 mg | ORAL_TABLET | Freq: Two times a day (BID) | ORAL | Status: DC

## 2016-04-24 NOTE — Patient Instructions (Signed)
1. Continue taking Carbidopa Levodopa 25/100 - one tablet three times daily.  2. Increase Mirapex 0.0125 mg - one tablet twice daily.

## 2016-04-24 NOTE — Progress Notes (Signed)
Elizabeth [name] was seen today in the movement disorders clinic for neurologic consultation at the request of Binnie Rail, MD.  The consultation is for the evaluation of tremor and to r/o PD.  The tremor is in the L hand and is noted if she picks up something.  She is right hand dominant.  She has noted tremor for max of 2 years.  It really has not changed with time.  No family hx of tremor.  Denies leg tremor or head tremor.     Specific Symptoms:  Tremor: Yes.     Affected by caffeine:  No. (1 cup coffee per day)  Affected by alcohol:  No. (only drinks at christmas)  Affected by stress:  Yes.    Affected by fatigue:  Yes.    Spills soup if on spoon:  No. (involves non dominant hand)  Spills glass of liquid if full:  No. (involves non dominant hand)  Affects ADL's (tying shoes, brushing teeth, etc):  No.   By far her biggest c/o is inability to sit comfortably because of the need to move the legs when sitting.  "If I don't move them, I feel like I will scream."  It generally happens when she tries to watch TV.  She was told by her dermatologist that it was due to venous insufficiency.  If she gets up and moves around the sx's are better.  She can sit and play bridge and scrabble for hours without problem but cannot watch TV without getting up and moving around  Any other sx's:   Voice: no change Sleep: trouble getting asleep because legs are not comfortable; "part of it may be my nervous"  Vivid Dreams:  Yes.  , "I feel like my mother is in the room with me and she is deceased"  Acting out dreams:  No., but "I live by myself" Wet Pillows: No. Postural symptoms:  Yes.    Falls?  Yes.  ; went to ED on 12/13/14 after a fall between the commode and the wall and she hit her head.  She drove herself to the ED.  States that she had a similar fall in 2013. Bradykinesia symptoms: difficulty getting out of a chair Loss of smell:  No. Loss of taste:  No. Urinary Incontinence:  Yes.  , bladder  suspension 20 years ago but gotten worse again (on myrbetriq) Difficulty Swallowing:  No. Handwriting, micrographia: No. Trouble buttoning clothing: Yes.   Depression:  Yes.   (worse since retirement) Memory changes:  No. Hallucinations:  No.  visual distortions: No. N/V:  No. Lightheaded:  No.  Syncope: No. Diplopia:  No. Dyskinesia:  No.   09/13/15 update:  The patient returns today for follow-up.  Last visit, I started her on low-dose Mirapex, 0.125 mg for her restless leg. Pt states that the medication really helped and "I need it for the rest of my days."   She had some blood work and she was anemic with a hemoglobin of 11.  I had intended to have her iron stores/TIBC drawn but for some reason that was not done.  Neuroimaging has previously been performed.  It is available for my review today.  Had a CT brain in the ED in Jan 2016 after a fall.  There was an old infact in the R occipito-parietal region and L caudate region; there was small vessel disease and atrophy.  03/13/16 update:  The patient is following up today and has a history  of restless leg syndrome.  She is on Mirapex, 0.125 mg.  She was slightly anemic when I checked her hemoglobin last October (1.6) and her primary care physician rechecked this in February and her hemoglobin was 11.0.  Overall, this has been stable for quite some time.  Her iron stores and iron saturation were good in October.  With the medication, she has been sleeping well.  Her biggest c/o is neck pain x several months.  She saw Dr. Noemi Chapel and states that she had a cortisone injection but that was in the right shoulder.  Sometimes, the pain radiates in the neck.  X rays were done of the cervical spine but no other neuroimaging.  No PT for the neck.     04/24/16 update:  The patient is following up today.  Last visit, she looked more parkinsonian than previous visits, so I decided to start her on a trial of carbidopa/levodopa 25/100, one tablet 3 times per day.   Pt states this definitely helped.   She is also on Mirapex 0.125 mg at night.  She states that it is helping but is having some sx during the day and it drives her nuts; the last time she played bingo, she had to keep getting up.  She denies any falls since last visit.  No lightheadedness or near syncope.  No hallucinations.  She did have an MRI of the cervical spine that demonstrated a disc protrusion at C5-C6 level.  There was bilateral moderate neural foraminal stenosis at the C3/C4 level and C5/C6 level.  She was referred to physical therapy.  She has an appt upcoming.  States that she has had "neck popping" but no significant pain.  Asks me for a neck brace because she feels that she is humping over some and has trouble keeping neck up.      ALLERGIES:   Allergies  Allergen Reactions  . Atorvastatin     REACTION: Felt funny (only way patient could describe)  . Vesicare [Solifenacin]     constipation    CURRENT MEDICATIONS:  Outpatient Encounter Prescriptions as of 04/24/2016  Medication Sig  . aspirin 81 MG tablet Take 81 mg by mouth daily.    . Calcium Carbonate-Vitamin D (CALCIUM + D PO) Take 1 tablet by mouth daily. Reported on 12/25/2015  . carbidopa-levodopa (SINEMET IR) 25-100 MG tablet Take 1 tablet by mouth 3 (three) times daily.  . cholecalciferol (VITAMIN D) 1000 UNITS tablet Take 1,000 Units by mouth daily.    . Cyanocobalamin (VITAMIN B-12 IJ) Inject as directed.  Marland Kitchen levothyroxine (SYNTHROID, LEVOTHROID) 25 MCG tablet Take 1 tablet (25 mcg total) by mouth daily.  . pramipexole (MIRAPEX) 0.125 MG tablet Take 1 tablet (0.125 mg total) by mouth 2 (two) times daily.  . rosuvastatin (CRESTOR) 10 MG tablet TAKE (1) TABLET ON MONDAY, WEDNESDAY AND FRIDAY.  Marland Kitchen vitamin E 400 UNIT capsule Take 400 Units by mouth daily.   . [DISCONTINUED] pramipexole (MIRAPEX) 0.125 MG tablet Take 1 tablet (0.125 mg total) by mouth at bedtime.  . [DISCONTINUED] cyanocobalamin ((VITAMIN B-12)) injection  1,000 mcg   . [DISCONTINUED] cyanocobalamin ((VITAMIN B-12)) injection 1,000 mcg    No facility-administered encounter medications on file as of 04/24/2016.    PAST MEDICAL HISTORY:   Past Medical History  Diagnosis Date  . Vitamin B12 deficiency   . Pernicious anemia   . Hypothyroidism   . Osteopenia     PMH fracture heel, wrist  . Hyperlipidemia   . Abnormal  Pap smear of vagina 09/2006    Dr Nori Riis  . Post-polio syndrome     minor problems L leg    PAST SURGICAL HISTORY:   Past Surgical History  Procedure Laterality Date  . Bladder suspension  2000  . No colonoscopy      "I never felt I needed one "    SOCIAL HISTORY:   Social History   Social History  . Marital Status: Single    Spouse Name: N/A  . Number of Children: N/A  . Years of Education: N/A   Occupational History  . Not on file.   Social History Main Topics  . Smoking status: Never Smoker   . Smokeless tobacco: Not on file  . Alcohol Use: 0.0 oz/week    0 Standard drinks or equivalent per week     Comment: wine very rarely (at Christmas)  . Drug Use: No  . Sexual Activity: Not on file   Other Topics Concern  . Not on file   Social History Narrative    FAMILY HISTORY:   Family Status  Relation Status Death Age  . Maternal Grandfather Deceased 18  . Mother Deceased     emphysema, HTN  . Father Deceased     alzheimer's  . Brother Deceased     drown    ROS:  A complete 10 system review of systems was obtained and was unremarkable apart from what is mentioned above.  PHYSICAL EXAMINATION:    VITALS:   Filed Vitals:   04/24/16 1403  BP: 120/74  Pulse: 69  Height: 5' (1.524 m)  Weight: 99 lb (44.906 kg)    GEN:  The patient appears stated age and is in NAD. HEENT:  Normocephalic, atraumatic.  The mucous membranes are moist. The superficial temporal arteries are without ropiness or tenderness. CV:  RRR Lungs:  CTAB Neck/HEME:  There are no carotid bruits bilaterally.  Neurological  examination:  Orientation: The patient is alert and oriented x3.  Cranial nerves: There is good facial symmetry. There is facial hypomimia. The visual fields are full to confrontational testing. The speech is fluent and clear but hypophonic. Soft palate rises symmetrically and there is no tongue deviation. Hearing is intact to conversational tone. Sensation: Sensation is intact to light touch throughout Motor: Strength is 5/5 in the bilateral upper and lower extremities.   Shoulder shrug is equal and symmetric.  There is no pronator drift.   Movement examination: Tone: There is normal tone in the bilateral upper extremities.  The tone in the lower extremities is  normal.  Abnormal movements:  She has a L hand rest tremor but only when she ambulates Coordination:  There is some difficulty with hand opening and closing bilaterally and finger taps more on the L. Gait and Station: The patient has minimal difficulty arising out of a deep-seated chair without the use of the hands.  She is short stepped.  She has L hand tremor with ambulation and slightly drags the L leg with ambulation (states had polio as a child)  LABS  Lab Results  Component Value Date   TSH 2.52 01/03/2016     Chemistry      Component Value Date/Time   NA 141 01/03/2016 1502   K 3.7 01/03/2016 1502   CL 106 01/03/2016 1502   CO2 29 01/03/2016 1502   BUN 17 01/03/2016 1502   CREATININE 0.80 01/03/2016 1502      Component Value Date/Time   CALCIUM 9.2 01/03/2016 1502  ALKPHOS 82 01/03/2016 1502   AST 27 01/03/2016 1502   ALT 18 01/03/2016 1502   BILITOT 0.8 01/03/2016 1502     Lab Results  Component Value Date   WBC 4.5 01/03/2016   HGB 11.0* 01/03/2016   HCT 33.4* 01/03/2016   MCV 85.6 01/03/2016   PLT 174.0 01/03/2016     ASSESSMENT/PLAN:  1.  Mild parkinsons disease  -continue carbidopa/levodopa 25/100 tid dosing.  Risks, benefits, side effects and alternative therapies were discussed.  The  opportunity to ask questions was given and they were answered to the best of my ability.  The patient expressed understanding and willingness to follow the outlined treatment protocols. 2.  Restless leg syndrome  -Is much better with mirapex, 0.125 mg but will increase to bid to see if helps with daytime RLS.  Watch for cognitive change 3.  Degenerative changes in the cervical region  -has upcoming appt with PT  -asks me for RX for cervical collar.  Provided but told her that I don't want her using all the time or neck mm will become weak. 4.  Follow up is anticipated in the next 3 months, sooner should new issues arise.  Much greater than 50% of this visit was spent in counseling and coordinating care.  Total face to face time:  25 min

## 2016-05-01 ENCOUNTER — Ambulatory Visit: Payer: Medicare Other | Admitting: Rehabilitation

## 2016-05-08 ENCOUNTER — Ambulatory Visit: Payer: Medicare Other | Admitting: Rehabilitation

## 2016-05-15 ENCOUNTER — Ambulatory Visit: Payer: Medicare Other

## 2016-05-22 ENCOUNTER — Ambulatory Visit: Payer: Medicare Other | Admitting: Physical Therapy

## 2016-05-29 ENCOUNTER — Ambulatory Visit: Payer: Medicare Other | Attending: Neurology | Admitting: Physical Therapy

## 2016-05-29 DIAGNOSIS — M542 Cervicalgia: Secondary | ICD-10-CM

## 2016-05-29 DIAGNOSIS — R293 Abnormal posture: Secondary | ICD-10-CM

## 2016-05-29 DIAGNOSIS — M6281 Muscle weakness (generalized): Secondary | ICD-10-CM

## 2016-05-29 NOTE — Therapy (Signed)
[facility] [address] [place], [place], [address] Phone: [phone]   Fax:  [phone]  Physical Therapy Evaluation  Patient Details  Name: Elizabeth [name] MRN: [id number] Date of Birth: 08-15-1931 Referring Provider: Wells Guiles Tat  Encounter Date: 05/29/2016      PT End of Session - 05/29/16 2117    Visit Number 1   Number of Visits 10   Date for PT Re-Evaluation 07/24/16   Authorization Type [employer] Medicare G codes;  KX at visit 15   PT Start Time 1445   PT Stop Time 1530   PT Time Calculation (min) 45 min   Activity Tolerance Patient tolerated treatment well      Past Medical History  Diagnosis Date  . Vitamin B12 deficiency   . Pernicious anemia   . Hypothyroidism   . Osteopenia     PMH fracture heel, wrist  . Hyperlipidemia   . Abnormal Pap smear of vagina 09/2006    Dr Nori Riis  . Post-polio syndrome     minor problems L leg    Past Surgical History  Procedure Laterality Date  . Bladder suspension  2000  . No colonoscopy      "I never felt I needed one "    There were no vitals filed for this visit.       Subjective Assessment - 05/29/16 1453    Subjective My neck hurts and pops a lot for the last few months.  Pops and hurts with movement.  Fatigues with housework.  Stiff with turning head while driving.  complains of difficulty holding head up;  fearful of falls and difficulty stepping up on curb   Pertinent History post-polio syndrome;  the doctor says I"m "borderline Parkinson's";  hand tremor x 2 years;  osteopenia (hx of wrist and heel fx)   Limitations House hold activities   How long can you sit comfortably? My restless legs limit sitting  to < 30 min   Diagnostic tests MRI showed DDD   Patient Stated Goals hope to get in better shape;  less tired;  how head up better   Currently in Pain? Yes   Pain Score 5    Pain Location Neck   Pain Orientation Right;Left   Pain Type Chronic pain    Pain Onset More than a month ago   Pain Frequency Intermittent   Aggravating Factors  turning;  bending over   Pain Relieving Factors massage might but I've never tried             [facility] PT Assessment - 05/29/16 0001    Assessment   Medical Diagnosis neck pain    Referring Provider Wells Guiles Tat   Onset Date/Surgical Date --  3 months   Hand Dominance Right   Next MD Visit 6 months   Prior Therapy [facility] hand strengthening  1 year ago   Precautions   Precautions Other (comment)  osteopenia   Restrictions   Weight Bearing Restrictions No   Balance Screen   Has the patient fallen in the past 6 months No   Has the patient had a decrease in activity level because of a fear of falling?  Yes  did some balance at previous PT   Is the patient reluctant to leave their home because of a fear of falling?  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   Leisure bridge and bingo   Observation/Other Assessments   Focus on Therapeutic Outcomes (FOTO)  48% limitation    Posture/Postural Control   Posture/Postural Control Postural limitations   Postural Limitations Increased thoracic kyphosis;Rounded Shoulders;Forward head   Posture Comments in sitting 10 degree cervical flexion  height 57 1/2 inches   ROM / Strength   AROM / PROM / Strength AROM;Strength   AROM   AROM Assessment Site Cervical;Shoulder   Right/Left Shoulder --  Bilateral shoulder WFLs   Cervical Flexion 20   Cervical Extension 40   Cervical - Right Side Bend 10   Cervical - Left Side Bend 10   Cervical - Right Rotation 28   Cervical - Left Rotation 40   Strength    Overall Strength Comments periscapular strength 4-/5   Strength Assessment Site Cervical   Cervical Flexion 3/5   Cervical Extension 3/5   Palpation   Palpation comment No tenderness   Special Tests    Special Tests Cervical   Cervical Tests Dictraction   Distraction Test   Findngs Positive                           PT Education - 05/29/16 1527    Education provided Yes   Education Details cervical retractions;  scapular retractions   Person(s) Educated Patient   Methods Explanation;Demonstration;Handout   Comprehension Verbalized understanding;Returned demonstration          PT Short Term Goals - 05/29/16 2139    PT SHORT TERM GOAL #1   Title The patient will demonstrate awareness of postural correction, ostepenia precautions with movement 06/26/16   Time 4   Period Weeks   Status New   PT SHORT TERM GOAL #2   Title Neck pain with turning head, driving and bending over improved by 25%    Time 4   Period Weeks   Status New   PT SHORT TERM GOAL #3   Title Cervical right rotation improved to 33 degrees needed for driving   Time 4   Period Weeks   Status New           PT Long Term Goals - 05/29/16 2142    PT LONG TERM GOAL #1   Title The patient will be independent in safe, self progression of HEP for further improvements in ROM, strength, posture and pain   07/24/16   Time 8   Period Weeks   Status New   PT LONG TERM GOAL #2   Title The patient will have improved thoracic extension and overall postural correction in standing with a height measurement of 57 3/4 inches   Time 8   Period Weeks   Status New   PT LONG TERM GOAL #3   Title Neck pain improvement with driving and home ADLs at 50%   Time 8   Period Weeks   PT LONG TERM GOAL #4   Title Cervical and periscapular muscle strength improved to 4/5 needed for postural correction for ADLs   Time 8   Period Weeks   Status New   PT LONG TERM GOAL #5   Title Right  cervical rotation of 38  degrees needed for driving   Time 8   Period Weeks   Status New   Additional Long Term Goals   Additional Long Term Goals Yes   PT LONG TERM GOAL #6   Title FOTO functional outcome score improved to 37% limitation indicating improved function with less pain   Time 8   Period Weeks   Status New               Plan - 05/29/16 2118    Clinical Impression Statement The patient presents with a 3 month history of neck pain which began for no apparent reason.  She reports she also has restless legs and is fearful of falling but is referred to PT at this time for her neck pain only.  She may benefit from additional PT following treatment for her neck to address this problem.  She complains of difficulty holding her head up and at rest hold her head flexed at 10 degrees, increased thoracic kyphosis and rounded shoulders.  She has difficulty turning her head for driving as well.  Decreased cervical AROM:  flexion 20, extension 40 ,  sidebending 10 degrees bilaterally, right rotation 28, left rotation 40.  Decreased cervical strength 3/5, scapular strength 4-/5.  Decreased pain with cervical distraction.  She requests a treatment frequency of 1x/week secondary to her schedule (works part-time).  She is of moderate complexity evaluation .     Rehab Potential Good   Clinical Impairments Affecting Rehab Potential osteopenia;  falls;  post-polio syndrome;  ? Parkinson's;  restless legs   PT Frequency 1x / week   PT Duration 8 weeks   PT Treatment/Interventions ADLs/Self Care Home Management;Cryotherapy;Electrical Stimulation;Moist Heat;Ultrasound;Traction;Therapeutic exercise;Therapeutic activities;Neuromuscular re-education;Patient/family education;Manual techniques;Dry needling;Taping   PT Next Visit Plan review cervical retraction and scapular squeezes;  supine scapular exercises;  postural strengthening;   manual soft tissue work and cervical distraction with caution secondary to osteopenia;   modalities as needed for pain      Patient will benefit from skilled therapeutic intervention in order to improve the following deficits and impairments:  Decreased range of motion, Decreased strength, Increased muscle spasms, Postural dysfunction, Pain  Visit Diagnosis: Cervicalgia - Plan: PT plan of care cert/re-cert  Muscle weakness (generalized) - Plan: PT plan of care cert/re-cert  Abnormal posture - Plan: PT plan of care cert/re-cert      G-Codes - [id number]    Functional Assessment Tool Used FOTO;clinical judgement    Functional Limitation Changing and maintaining body position   Changing and Maintaining Body Position Current Status [id number]) At least 40 percent but less than 60 percent impaired, limited or restricted   Changing and Maintaining Body Position Goal Status [id number]) At least 20 percent but less than 40 percent impaired, limited or restricted       Problem List Patient Active Problem List   Diagnosis Date Noted  . Restless leg syndrome 12/25/2015  . Arthritis 12/25/2015  . Abnormality of gait 05/24/2015  . Intention tremor 12/08/2013  . Occipital infarction ([place]) 07/20/2012  . IRRITABLE BOWEL SYNDROME 01/17/2009  . Cervicalgia 01/17/2009  . INCONTINENCE 09/07/2008  . ANEMIA, B12 DEFICIENCY 04/26/2008  . GERD 04/12/2008  . HYPERLIPIDEMIA 01/26/2008  . TIA 01/26/2008  . Hypothyroidism 01/04/2008  . OSTEOPENIA 01/04/2008   [name], PT 05/29/2016 9:51 PM Phone: [phone] Fax: [phone]  Alvera Singh 05/29/2016, 9:50 PM  [facility] [address], [place], [place], [address]  Phone: [phone]   Fax:  [phone]  Name: Jessicaann Kozyra Cripps MRN: [id number] Date of Birth: [birth year]

## 2016-05-29 NOTE — Patient Instructions (Signed)
  [name] PT [facility] [address], [place], [place] [address] Phone # [phone] Fax [phone]    Extensors, Supine   Lie supine, head on small, rolled towel. Gently tuck chin and bring toward chest. Hold _3-5__ seconds. Repeat _8__ times per session. Do __2_ sessions per day.  Copyright  VHI. All rights reserved.  Flexibility: Neck Retraction   Pull head straight back, keeping eyes and jaw level. Repeat __8__ times per set. Do __1__ sets per session. Do _3___ sessions per day.  http://orth.exer.us/344   Copyright  VHI. All rights reserved.

## 2016-06-05 ENCOUNTER — Other Ambulatory Visit: Payer: Self-pay | Admitting: Internal Medicine

## 2016-06-05 ENCOUNTER — Ambulatory Visit: Payer: Medicare Other | Admitting: Physical Therapy

## 2016-06-07 ENCOUNTER — Telehealth: Payer: Self-pay

## 2016-06-07 NOTE — Telephone Encounter (Signed)
Patient is on the list for [employer] 2017 and may be a good candidate for an AWV in 2017. Please let me know if/when appt is scheduled.   

## 2016-06-10 NOTE — Telephone Encounter (Signed)
Call to Elizabeth [name] and LVM for fup for AWV; can do prior to Dr. Quay Burow at 12:45 on August 2nd and apt with Quay Burow is at 1:30. Awaiting call back for questions or to confirm

## 2016-06-19 ENCOUNTER — Ambulatory Visit: Payer: Medicare Other

## 2016-06-19 DIAGNOSIS — M542 Cervicalgia: Secondary | ICD-10-CM

## 2016-06-19 DIAGNOSIS — R293 Abnormal posture: Secondary | ICD-10-CM

## 2016-06-19 DIAGNOSIS — M6281 Muscle weakness (generalized): Secondary | ICD-10-CM

## 2016-06-19 NOTE — Therapy (Signed)
[facility] [address] [place], [place], [address] Phone: [phone]   Fax:  [phone]  Physical Therapy Treatment  Patient Details  Name: Elizabeth [name] MRN: [id number] Date of Birth: 03/05/1931 Referring Provider: Wells Guiles Tat  Encounter Date: 06/19/2016      PT End of Session - 06/19/16 1307    Visit Number 2   Number of Visits 10   Date for PT Re-Evaluation 07/24/16   Authorization Type UHC Medicare G codes;  KX at visit 15   PT Start Time 1232   PT Stop Time 1315  low level exercise and manual therapy.  Not able to tolerate 45 minutes   PT Time Calculation (min) 43 min   Activity Tolerance Patient tolerated treatment well   Behavior During Therapy WFL for tasks assessed/performed      Past Medical History:  Diagnosis Date  . Abnormal Pap smear of vagina 09/2006   Dr Nori Riis  . Hyperlipidemia   . Hypothyroidism   . Osteopenia    PMH fracture heel, wrist  . Pernicious anemia   . Post-polio syndrome    minor problems L leg  . Vitamin B12 deficiency     Past Surgical History:  Procedure Laterality Date  . BLADDER SUSPENSION  2000  . no colonoscopy     "I never felt I needed one "    There were no vitals filed for this visit.      Subjective Assessment - 06/19/16 1235    Subjective Pt with lapse in treatment for 3 weeks.  Pt reports that she has been doing HEP and working on posture at home and work.     Currently in Pain? Yes   Pain Score 5    Pain Location Neck   Pain Orientation Right;Left   Pain Descriptors / Indicators Tightness;Aching   Pain Type Chronic pain   Pain Onset More than a month ago   Pain Frequency Intermittent   Aggravating Factors  turning head   Pain Relieving Factors not sure                         [facility] Adult PT Treatment/Exercise - 06/19/16 0001      Exercises   Exercises Shoulder;Neck     Neck Exercises: Seated   Neck Retraction 10 reps   Lateral Flexion 10 reps   Other Seated Exercise scapular squeezes     Shoulder Exercises: Supine   External Rotation Strengthening;Both;20 reps;Theraband   Theraband Level (Shoulder External Rotation) Level 1 (Yellow)   Other Supine Exercises horizontal abduction yellow 2x10     Modalities   Modalities Moist Heat     Moist Heat Therapy   Number Minutes Moist Heat 10 Minutes   Moist Heat Location Cervical     Manual Therapy   Manual Therapy Soft tissue mobilization;Myofascial release   Manual therapy comments soft tissue elongation and trigger point release to bil neck, UT and suboccipitals                PT Education - 06/19/16 1242    Education provided Yes   Education Details UT stretch, supine yellow theraband   Person(s) Educated Patient   Methods Explanation;Demonstration;Handout   Comprehension Verbalized understanding;Returned demonstration          PT Short Term Goals - 06/19/16 1246      PT SHORT TERM GOAL #1   Title The patient will demonstrate awareness of postural  correction, ostepenia precautions with movement 06/26/16   Time 4   Period Weeks   Status On-going     PT SHORT TERM GOAL #2   Title Neck pain with turning head, driving and bending over improved by 25%    Time 4   Period Weeks   Status On-going     PT SHORT TERM GOAL #3   Title Cervical right rotation improved to 33 degrees needed for driving   Time 4   Period Weeks   Status On-going           PT Long Term Goals - 05/29/16 2142      PT LONG TERM GOAL #1   Title The patient will be independent in safe, self progression of HEP for further improvements in ROM, strength, posture and pain   07/24/16   Time 8   Period Weeks   Status New     PT LONG TERM GOAL #2   Title The patient will have improved thoracic extension and overall postural correction in standing with a height measurement of 57 3/4 inches   Time 8   Period Weeks   Status New     PT LONG TERM GOAL #3   Title  Neck pain improvement with driving and home ADLs at 50%   Time 8   Period Weeks     PT LONG TERM GOAL #4   Title Cervical and periscapular muscle strength improved to 4/5 needed for postural correction for ADLs   Time 8   Period Weeks   Status New     PT LONG TERM GOAL #5   Title Right cervical rotation of 38 degrees needed for driving   Time 8   Period Weeks   Status New     Additional Long Term Goals   Additional Long Term Goals Yes     PT LONG TERM GOAL #6   Title FOTO functional outcome score improved to 37% limitation indicating improved function with less pain   Time 8   Period Weeks   Status New               Plan - 06/19/16 1243    Clinical Impression Statement Pt with lapse in treatment x 3 weeks after evaluation.  Pt with significant forward head and rounded shoulder posture with fixed head.  Pt has been working on postural corrections including scapular retraction and cervical retractions.  Pt with tension in bil. neck and UT.  Pt will benefit from skilled PT for postural strength, cervical flexibility and manual/modalities for pain management.     Rehab Potential Good   PT Frequency 1x / week   PT Duration 8 weeks   PT Treatment/Interventions ADLs/Self Care Home Management;Cryotherapy;Electrical Stimulation;Moist Heat;Ultrasound;Traction;Therapeutic exercise;Therapeutic activities;Neuromuscular re-education;Patient/family education;Manual techniques;Dry needling;Taping   PT Next Visit Plan Cervical flexibility, postural strength in supine, manual and modalities for pain. Osteoporosis education to address STG.     Consulted and Agree with Plan of Care Patient      Patient will benefit from skilled therapeutic intervention in order to improve the following deficits and impairments:  Decreased range of motion, Decreased strength, Increased muscle spasms, Postural dysfunction, Pain  Visit Diagnosis: Cervicalgia  Muscle weakness (generalized)  Abnormal  posture     Problem List Patient Active Problem List   Diagnosis Date Noted  . Restless leg syndrome 12/25/2015  . Arthritis 12/25/2015  . Abnormality of gait 05/24/2015  . Intention tremor 12/08/2013  . Occipital infarction ([place]) 07/20/2012  .  IRRITABLE BOWEL SYNDROME 01/17/2009  . Cervicalgia 01/17/2009  . INCONTINENCE 09/07/2008  . ANEMIA, B12 DEFICIENCY 04/26/2008  . GERD 04/12/2008  . HYPERLIPIDEMIA 01/26/2008  . TIA 01/26/2008  . Hypothyroidism 01/04/2008  . OSTEOPENIA 01/04/2008     Sigurd Sos, PT 06/19/16 1:08 PM  [facility] [address] [place], [place], [address] Phone: [phone]   Fax:  [phone]  Name: Shifra Pricer Brandenburger MRN: [id number] Date of Birth: 11/30/1930

## 2016-06-19 NOTE — Patient Instructions (Addendum)
AROM: Lateral Neck Flexion    Slowly tilt head toward one shoulder, then the other. Hold each position __20__ seconds. Repeat __3__ times per set. Do _1___ sets per session. Do _3___ sessions per day.       Side Pull: Double Arm   On back, knees bent, feet flat. Arms perpendicular to body, shoulder level, elbows straight but relaxed. Pull arms out to sides, elbows straight. Resistance band comes across collarbones, hands toward floor. Hold momentarily. Slowly return to starting position. Repeat _10__ times. Band color _yellow____      Shoulder Rotation: Double Arm   On back, knees bent, feet flat, elbows tucked at sides, bent 90, hands palms up. Pull hands apart and down toward floor, keeping elbows near sides. Hold momentarily. Slowly return to starting position. Repeat _10__ times. Band color __yellow        [facility] [place] [facility] [address], [place], [place] [address] Phone # [phone] Fax [phone]____

## 2016-06-25 ENCOUNTER — Ambulatory Visit: Payer: Medicare Other | Admitting: Internal Medicine

## 2016-06-25 DIAGNOSIS — G20 Parkinson's disease: Secondary | ICD-10-CM

## 2016-06-25 DIAGNOSIS — G20A1 Parkinson's disease without dyskinesia, without mention of fluctuations: Secondary | ICD-10-CM

## 2016-06-25 HISTORY — DX: Parkinson's disease without dyskinesia, without mention of fluctuations: G20.A1

## 2016-06-25 HISTORY — DX: Parkinson's disease: G20

## 2016-06-25 NOTE — Progress Notes (Deleted)
Subjective:    Patient ID: Elizabeth [name], female    DOB: [birth year], 82 y.o.   MRN: [id number]  HPI She is here for follow up.  Hypothyroidism:  She is taking her medication daily.  She denies any recent changes in energy or weight that are unexplained.   Hyperlipidemia: She is taking her medication daily. She is compliant with a low fat/cholesterol diet. She is exercising regularly. She denies myalgias.   RLS, Parkinson's disease:    Neck pain:  A recent MRI showed a disc bulge and degenerative changes.  She is going PT.      Medications and allergies reviewed with patient and updated if appropriate.  Patient Active Problem List   Diagnosis Date Noted  . Restless leg syndrome 12/25/2015  . Arthritis 12/25/2015  . Abnormality of gait 05/24/2015  . Intention tremor 12/08/2013  . Occipital infarction ([place]) 07/20/2012  . IRRITABLE BOWEL SYNDROME 01/17/2009  . Cervicalgia 01/17/2009  . INCONTINENCE 09/07/2008  . ANEMIA, B12 DEFICIENCY 04/26/2008  . GERD 04/12/2008  . HYPERLIPIDEMIA 01/26/2008  . TIA 01/26/2008  . Hypothyroidism 01/04/2008  . OSTEOPENIA 01/04/2008    Current Outpatient Prescriptions on File Prior to Visit  Medication Sig Dispense Refill  . aspirin 81 MG tablet Take 81 mg by mouth daily.      . Calcium Carbonate-Vitamin D (CALCIUM + D PO) Take 1 tablet by mouth daily. Reported on 12/25/2015    . carbidopa-levodopa (SINEMET IR) 25-100 MG tablet Take 1 tablet by mouth 3 (three) times daily. 90 tablet 1  . cholecalciferol (VITAMIN D) 1000 UNITS tablet Take 1,000 Units by mouth daily.      . Cyanocobalamin (VITAMIN B-12 IJ) Inject as directed.    Marland Kitchen levothyroxine (SYNTHROID, LEVOTHROID) 25 MCG tablet Take 1 tablet (25 mcg total) by mouth daily. 90 tablet 1  . pramipexole (MIRAPEX) 0.125 MG tablet Take 1 tablet (0.125 mg total) by mouth 2 (two) times daily. 180 tablet 1  . rosuvastatin (CRESTOR) 10 MG tablet TAKE (1) TABLET ON MONDAY, WEDNESDAY AND FRIDAY. 30  tablet 3  . vitamin E 400 UNIT capsule Take 400 Units by mouth daily.      No current facility-administered medications on file prior to visit.     Past Medical History:  Diagnosis Date  . Abnormal Pap smear of vagina 09/2006   Dr Nori Riis  . Hyperlipidemia   . Hypothyroidism   . Osteopenia    PMH fracture heel, wrist  . Pernicious anemia   . Post-polio syndrome    minor problems L leg  . Vitamin B12 deficiency     Past Surgical History:  Procedure Laterality Date  . BLADDER SUSPENSION  2000  . no colonoscopy     "I never felt I needed one "    Social History   Social History  . Marital status: Single    Spouse name: N/A  . Number of children: N/A  . Years of education: N/A   Social History Main Topics  . Smoking status: Never Smoker  . Smokeless tobacco: Not on file  . Alcohol use 0.0 oz/week     Comment: wine very rarely (at Christmas)  . Drug use: No  . Sexual activity: Not on file   Other Topics Concern  . Not on file   Social History Narrative  . No narrative on file    Family History  Problem Relation Age of Onset  . Breast cancer Maternal Grandmother   . Cancer Maternal Grandmother  breast  . Heart attack Maternal Grandfather     70s  . Emphysema Mother   . Hypertension Mother   . Alzheimer's disease Father   . Diabetes Neg Hx   . Stroke Neg Hx   . Parkinson's disease Neg Hx     Review of Systems     Objective:  There were no vitals filed for this visit. There were no vitals filed for this visit. There is no height or weight on file to calculate BMI.   Physical Exam        Assessment & Plan:    See Problem List for Assessment and Plan of chronic medical problems.

## 2016-06-25 NOTE — Assessment & Plan Note (Deleted)
Taking carbidopa-levodopa Management per Dr Carles Collet

## 2016-06-26 ENCOUNTER — Ambulatory Visit: Payer: Medicare Other | Attending: Neurology | Admitting: Physical Therapy

## 2016-06-26 DIAGNOSIS — M6281 Muscle weakness (generalized): Secondary | ICD-10-CM | POA: Insufficient documentation

## 2016-06-26 DIAGNOSIS — M542 Cervicalgia: Secondary | ICD-10-CM | POA: Diagnosis present

## 2016-06-26 DIAGNOSIS — R293 Abnormal posture: Secondary | ICD-10-CM | POA: Diagnosis present

## 2016-06-26 NOTE — Therapy (Signed)
[facility] [address], [place], [place], [address] Phone: [phone]   Fax:  [phone]  Physical Therapy Treatment  Patient Details  Name: Elizabeth [name] MRN: [id number] Date of Birth: 12-04-1930 Referring Provider: Wells Guiles Tat  Encounter Date: 06/26/2016      PT End of Session - 06/26/16 1530    Visit Number 3   Number of Visits 10   Date for PT Re-Evaluation 07/24/16   Authorization Type UHC Medicare G codes;  KX at visit 15   PT Start Time 1445   PT Stop Time 1525   PT Time Calculation (min) 40 min   Activity Tolerance Patient tolerated treatment well      Past Medical History:  Diagnosis Date  . Abnormal Pap smear of vagina 09/2006   Dr Nori Riis  . Hyperlipidemia   . Hypothyroidism   . Osteopenia    PMH fracture heel, wrist  . Pernicious anemia   . Post-polio syndrome    minor problems L leg  . Vitamin B12 deficiency     Past Surgical History:  Procedure Laterality Date  . BLADDER SUSPENSION  2000  . no colonoscopy     "I never felt I needed one "    There were no vitals filed for this visit.      Subjective Assessment - 06/26/16 1449    Subjective Patient reports right shoulder pain upon arrival and neck pain when she turns her head.  Feels more discomfort in the mornings, better with movement and as the day goes on.     Currently in Pain? Yes   Pain Score 6    Pain Location Shoulder   Pain Orientation Right   Aggravating Factors  turning head                         [facility] Adult PT Treatment/Exercise - 06/26/16 0001      Neck Exercises: Machines for Strengthening   Other Machines for Strengthening Nu-Step L1 5 min     Neck Exercises: Standing   Wall Wash UE wall slides 10x   Other Standing Exercises standing against wall with isometric shoulder extension 10x; angels; overheads 10x each   Other Standing Exercises hip isometrics 5x 5 sec holds right/left     Neck  Exercises: Supine   Other Supine Exercise yellow band scapular exercises 7x each:  overhead, horizontal abduction, sash, ER 8x each     Shoulder Exercises: Seated   Other Seated Exercises thoracic ext over small ball 20x     Manual Therapy   Manual Therapy Manual Traction;Soft tissue mobilization;Muscle Energy Technique   Soft tissue mobilization cervical paraspinals   Manual Traction gentle 5x 20 sec   Muscle Energy Technique right/left upper trap contract/relax 3x 5 sec holds                PT Education - 06/26/16 1528    Education provided Yes   Education Details supine scapular ex's; seated thoracic ext over ball;  wall angels   Person(s) Educated Patient   Methods Explanation;Demonstration;Handout   Comprehension Verbalized understanding;Returned demonstration          PT Short Term Goals - 06/26/16 2032      PT SHORT TERM GOAL #1   Title The patient will demonstrate awareness of postural correction, ostepenia precautions with movement 06/26/16   Time 4   Period Weeks   Status On-going     PT  SHORT TERM GOAL #2   Title Neck pain with turning head, driving and bending over improved by 25%    Time 4   Period Weeks   Status On-going     PT SHORT TERM GOAL #3   Title Cervical right rotation improved to 33 degrees needed for driving   Time 4   Period Weeks   Status On-going           PT Long Term Goals - 06/26/16 2033      PT LONG TERM GOAL #1   Title The patient will be independent in safe, self progression of HEP for further improvements in ROM, strength, posture and pain   07/24/16   Time 8   Period Weeks   Status On-going     PT LONG TERM GOAL #2   Title The patient will have improved thoracic extension and overall postural correction in standing with a height measurement of 57 3/4 inches   Time 8   Period Weeks   Status On-going     PT LONG TERM GOAL #3   Title Neck pain improvement with driving and home ADLs at 50%   Time 8   Period Weeks    Status On-going     PT LONG TERM GOAL #4   Title Cervical and periscapular muscle strength improved to 4/5 needed for postural correction for ADLs   Time 8   Period Weeks   Status On-going     PT LONG TERM GOAL #5   Title Right cervical rotation of 38 degrees needed for driving   Time 8   Period Weeks   Status On-going     PT LONG TERM GOAL #6   Title FOTO functional outcome score improved to 37% limitation indicating improved function with less pain   Time 8   Period Weeks   Status On-going               Plan - 06/26/16 1530    Clinical Impression Statement STGs not met secondary to gap in treatment.  Patient reports "I like these exercises."  Good relief from gentle cervical distraction.  She is able to tolerate exercises in supine, sitting and standing without complaints of fatigue today.  She is able improve her postural alignment with wall support.  Therapist closely monitoring fatigue level and pain throughout session and providing cues for postural correction.     PT Next Visit Plan assess response to postural strengthening;  continue Nu-Step, supine and wall exercises; check progress with STGS and C AROM      Patient will benefit from skilled therapeutic intervention in order to improve the following deficits and impairments:     Visit Diagnosis: Cervicalgia  Muscle weakness (generalized)  Abnormal posture     Problem List Patient Active Problem List   Diagnosis Date Noted  . Parkinson disease ([place]) 06/25/2016  . Restless leg syndrome 12/25/2015  . Arthritis 12/25/2015  . Abnormality of gait 05/24/2015  . Intention tremor 12/08/2013  . Occipital infarction ([place]) 07/20/2012  . IRRITABLE BOWEL SYNDROME 01/17/2009  . Cervicalgia 01/17/2009  . INCONTINENCE 09/07/2008  . ANEMIA, B12 DEFICIENCY 04/26/2008  . GERD 04/12/2008  . HYPERLIPIDEMIA 01/26/2008  . TIA 01/26/2008  . Hypothyroidism 01/04/2008  . OSTEOPENIA 01/04/2008   [name],  PT 06/26/16 8:35 PM Phone: [phone] Fax: [phone]  Alvera Singh 06/26/2016, 8:35 PM  [facility] [address], [place], [place], [address] Phone: [phone]  Fax:  [phone]  Name: Elizabeth [name] MRN: [id number] Date of Birth: 24-Nov-1931

## 2016-06-26 NOTE — Patient Instructions (Signed)
     Over Head Pull: Narrow Grip       On back, knees bent, feet flat, band across thighs, elbows straight but relaxed. Pull hands apart (start). Keeping elbows straight, bring arms up and over head, hands toward floor. Keep pull steady on band. Hold momentarily. Return slowly, keeping pull steady, back to start. Repeat _8__ times. Band color _yellow_____   Side Pull: Double Arm   On back, knees bent, feet flat. Arms perpendicular to body, shoulder level, elbows straight but relaxed. Pull arms out to sides, elbows straight. Resistance band comes across collarbones, hands toward floor. Hold momentarily. Slowly return to starting position. Repeat _8__ times. Band color ___yellow__   Sash   On back, knees bent, feet flat, left hand on left hip, right hand above left. Pull right arm DIAGONALLY (hip to shoulder) across chest. Bring right arm along head toward floor. Hold momentarily. Slowly return to starting position. Repeat __8_ times. Do with left arm. Band color __yellow____   Shoulder Rotation: Double Arm   On back, knees bent, feet flat, elbows tucked at sides, bent 90, hands palms up. Pull hands apart and down toward floor, keeping elbows near sides. Hold momentarily. Slowly return to starting position. Repeat _8__ times. Band color _yellow_____   [facility] [address], [place], [place] [address] Phone # [phone] Fax [phone]

## 2016-07-03 ENCOUNTER — Ambulatory Visit: Payer: Medicare Other | Admitting: Physical Therapy

## 2016-07-03 DIAGNOSIS — M542 Cervicalgia: Secondary | ICD-10-CM | POA: Diagnosis not present

## 2016-07-03 DIAGNOSIS — M6281 Muscle weakness (generalized): Secondary | ICD-10-CM

## 2016-07-03 DIAGNOSIS — R293 Abnormal posture: Secondary | ICD-10-CM

## 2016-07-03 NOTE — Therapy (Signed)
[facility] [address], [place], [place], [address] Phone: [phone]   Fax:  [phone]  Physical Therapy Treatment  Patient Details  Name: Elizabeth [name] MRN: [id number] Date of Birth: 22-Jan-1931 Referring Provider: Wells Guiles Tat  Encounter Date: 07/03/2016      PT End of Session - 07/03/16 1436    Visit Number 4   Number of Visits 10   Date for PT Re-Evaluation 07/24/16   Authorization Type UHC Medicare G codes;  KX at visit 15   PT Start Time 1400   PT Stop Time 1440   PT Time Calculation (min) 40 min   Activity Tolerance Patient tolerated treatment well      Past Medical History:  Diagnosis Date  . Abnormal Pap smear of vagina 09/2006   Dr Nori Riis  . Hyperlipidemia   . Hypothyroidism   . Osteopenia    PMH fracture heel, wrist  . Pernicious anemia   . Post-polio syndrome    minor problems L leg  . Vitamin B12 deficiency     Past Surgical History:  Procedure Laterality Date  . BLADDER SUSPENSION  2000  . no colonoscopy     "I never felt I needed one "    There were no vitals filed for this visit.      Subjective Assessment - 07/03/16 1402    Subjective Reports no difficulties following previous session.  Some knee soreness and neck soreness and tightness.     Currently in Pain? Yes   Pain Score 5    Pain Location Neck   Pain Orientation Right;Left   Pain Descriptors / Indicators Tightness   Pain Type Chronic pain   Aggravating Factors  turning head to drive                         [facility] Adult PT Treatment/Exercise - 07/03/16 0001      Neck Exercises: Standing   Wall Wash yellow band wall slides 10x right/left   Other Standing Exercises standing against wall with isometric shoulder extension 10x; angels; overheads 10x each   Other Standing Exercises hip isometrics 5x 5 sec holds right/left     Neck Exercises: Seated   Other Seated Exercise yellow band rows and shoulder  extensions 10x each     Neck Exercises: Supine   Other Supine Exercise supine osteoporosis realignment/decompression ex's 5x each     Shoulder Exercises: ROM/Strengthening   Other ROM/Strengthening Exercises nu-Step L1 7 min   Other ROM/Strengthening Exercises seated thoracic extension over towel roll     Manual Therapy   Soft tissue mobilization cervical paraspinals   Manual Traction gentle 5x 20 sec   Muscle Energy Technique right/left upper trap contract/relax 3x 5 sec holds                PT Education - 07/03/16 1434    Education provided Yes   Education Details osteoporosis realignment routine   Person(s) Educated Patient   Methods Explanation;Demonstration;Handout   Comprehension Verbalized understanding;Returned demonstration          PT Short Term Goals - 07/03/16 1702      PT SHORT TERM GOAL #1   Title The patient will demonstrate awareness of postural correction, ostepenia precautions with movement 06/26/16   Status Achieved     PT SHORT TERM GOAL #2   Title Neck pain with turning head, driving and bending over improved by 25%    Time  4   Period Weeks   Status On-going     PT SHORT TERM GOAL #3   Title Cervical right rotation improved to 33 degrees needed for driving   Time 4   Period Weeks   Status On-going           PT Long Term Goals - 07/03/16 1702      PT LONG TERM GOAL #1   Title The patient will be independent in safe, self progression of HEP for further improvements in ROM, strength, posture and pain   07/24/16   Time 8   Period Weeks   Status On-going     PT LONG TERM GOAL #2   Title The patient will have improved thoracic extension and overall postural correction in standing with a height measurement of 57 3/4 inches   Time 8   Period Weeks   Status On-going     PT LONG TERM GOAL #3   Title Neck pain improvement with driving and home ADLs at 50%   Time 8   Period Weeks   Status On-going     PT LONG TERM GOAL #4   Title  Cervical and periscapular muscle strength improved to 4/5 needed for postural correction for ADLs   Time 8   Period Weeks   Status On-going     PT LONG TERM GOAL #5   Title Right cervical rotation of 38 degrees needed for driving   Time 8   Period Weeks   Status On-going     PT LONG TERM GOAL #6   Title FOTO functional outcome score improved to 37% limitation indicating improved function with less pain   Time 8   Period Weeks   Status On-going               Plan - 07/03/16 1658    Clinical Impression Statement The patient continues to be receptive to postural strengthening.  Good relief from gentle cervical distraction.  Verbal cueing needed for erect posture but able to correct very well against the wall.  Continue with treatment plan.     Clinical Impairments Affecting Rehab Potential osteopenia;  falls;  post-polio syndrome;  ? Parkinson's;  restless legs   PT Next Visit Plan assess response to postural strengthening;  continue Nu-Step, supine and wall exercises; check  C AROM for STGs      Patient will benefit from skilled therapeutic intervention in order to improve the following deficits and impairments:     Visit Diagnosis: Cervicalgia  Muscle weakness (generalized)  Abnormal posture     Problem List Patient Active Problem List   Diagnosis Date Noted  . Parkinson disease ([place]) 06/25/2016  . Restless leg syndrome 12/25/2015  . Arthritis 12/25/2015  . Abnormality of gait 05/24/2015  . Intention tremor 12/08/2013  . Occipital infarction ([place]) 07/20/2012  . IRRITABLE BOWEL SYNDROME 01/17/2009  . Cervicalgia 01/17/2009  . INCONTINENCE 09/07/2008  . ANEMIA, B12 DEFICIENCY 04/26/2008  . GERD 04/12/2008  . HYPERLIPIDEMIA 01/26/2008  . TIA 01/26/2008  . Hypothyroidism 01/04/2008  . OSTEOPENIA 01/04/2008   [name], PT 07/03/16 5:04 PM Phone: [phone] Fax: [phone] Alvera Singh 07/03/2016, 5:04 PM  [facility] [address] [place], [place], [address] Phone: [phone]   Fax:  [phone]  Name: Elizabeth [name] MRN: [id number] Date of Birth: 10-26-1931

## 2016-07-03 NOTE — Patient Instructions (Addendum)
RE-ALIGNMENT ROUTINE EXERCISES-OSTEOPROROSIS BASIC FOR POSTURAL CORRECTION   RE-ALIGNMENT Tips BENEFITS: 1.It helps to re-align the curves of the back and improve standing posture. 2.It allows the back muscles to rest and strengthen in preparation for more activity. FREQUENCY: Daily, even after weeks, months and years of more advanced exercises. START: 1.All exercises start in the same position: lying on the back, arms resting on the supporting surface, palms up and slightly away from the body, backs of hands down, knees bent, feet flat. 2.The head, neck, arms, and legs are supported according to specific instructions of your therapist. Copyright  VHI. All rights reserved.    1. Decompression Exercise: Basic.   Takes compression off the vertebral bodies; increases tolerance for lying on the back; helps relieve back pain   Lie on back on firm surface, knees bent, feet flat, arms turned up, out to sides (~35 degrees). Head neck and arms supported as necessary. Time _5-15__ minutes. Surface: floor     2. Shoulder Press  Strengthens upper back extensors and scapular retractors.   Press both shoulders down. Hold _2-3__ seconds. Repeat _3-5__ times. Surface: floor        3. Head Press With [place]  Strengthens neck extensors   Tuck chin SLIGHTLY toward chest, keep mouth closed. Feel weight on back of head. Increase weight by pressing head down. Hold _2-3__ seconds. Relax. Repeat 3-5___ times. Surface: floor   4. Leg Lengthener: stretches quadratus lumborum and hip flexors.  Strengthens quads and ankle dorsiflexors.    A)  Straighten out 1 leg and "grow that leg longer."          B)  Press 1 leg "down into the sand."       5x 5 sec hold       [name] PT [facility] [address], [place], [place] [address] Phone # [phone] Fax [phone]

## 2016-07-10 ENCOUNTER — Ambulatory Visit: Payer: Medicare Other | Admitting: Physical Therapy

## 2016-07-16 ENCOUNTER — Ambulatory Visit: Payer: Medicare Other | Admitting: Internal Medicine

## 2016-07-17 ENCOUNTER — Ambulatory Visit: Payer: Medicare Other | Admitting: Physical Therapy

## 2016-07-17 DIAGNOSIS — M542 Cervicalgia: Secondary | ICD-10-CM | POA: Diagnosis not present

## 2016-07-17 DIAGNOSIS — M6281 Muscle weakness (generalized): Secondary | ICD-10-CM

## 2016-07-17 DIAGNOSIS — R293 Abnormal posture: Secondary | ICD-10-CM

## 2016-07-17 NOTE — Therapy (Signed)
[facility] [address], [place], [place], [address] Phone: [phone]   Fax:  [phone]  Physical Therapy Treatment  Patient Details  Name: Elizabeth [name] MRN: [id number] Date of Birth: 1930/11/30 Referring Provider: Wells Guiles Tat  Encounter Date: 07/17/2016    Past Medical History:  Diagnosis Date  . Abnormal Pap smear of vagina 09/2006   Dr Nori Riis  . Hyperlipidemia   . Hypothyroidism   . Osteopenia    PMH fracture heel, wrist  . Pernicious anemia   . Post-polio syndrome    minor problems L leg  . Vitamin B12 deficiency     Past Surgical History:  Procedure Laterality Date  . BLADDER SUSPENSION  2000  . no colonoscopy     "I never felt I needed one "    There were no vitals filed for this visit.      Subjective Assessment - 07/17/16 1356    Subjective I feel OK.  I get so tired and it takes me a long time to do anything.   Denies pain except had left neck pain when I got up today.  Turning my head is difficult.     Pertinent History post-polio syndrome;  the doctor says I"m "borderline Parkinson's";  hand tremor x 2 years;  osteopenia (hx of wrist and heel fx)   Currently in Pain? No/denies            [facility] PT Assessment - 07/17/16 0001      AROM   Cervical Flexion 45   Cervical Extension 40   Cervical - Right Side Bend 20   Cervical - Left Side Bend 20   Cervical - Right Rotation 30   Cervical - Left Rotation 35                     [facility] Adult PT Treatment/Exercise - 07/17/16 0001      Neck Exercises: Standing   Wall Push Ups 10 reps   Wall Wash roll ball up wall 10   Other Standing Exercises back against wall with small ball behind head with yellow band ER. HABD, diagonals 5-10 each   Other Standing Exercises standing against wall with yelllow band:  external rotation, horizontal abduction, diagonals 10x each     Knee/Hip Exercises: Aerobic   Nustep Nu-Step L 1 7 min      Manual Therapy   Soft tissue mobilization cervical paraspinals   Manual Traction gentle 5x 20 sec   Muscle Energy Technique right/left upper trap contract/relax 3x 5 sec holds                  PT Short Term Goals - 07/03/16 1702      PT SHORT TERM GOAL #1   Title The patient will demonstrate awareness of postural correction, ostepenia precautions with movement 06/26/16   Status Achieved     PT SHORT TERM GOAL #2   Title Neck pain with turning head, driving and bending over improved by 25%    Time 4   Period Weeks   Status On-going     PT SHORT TERM GOAL #3   Title Cervical right rotation improved to 33 degrees needed for driving   Time 4   Period Weeks   Status On-going           PT Long Term Goals - 07/03/16 1702      PT LONG TERM GOAL #1   Title The patient will  be independent in safe, self progression of HEP for further improvements in ROM, strength, posture and pain   07/24/16   Time 8   Period Weeks   Status On-going     PT LONG TERM GOAL #2   Title The patient will have improved thoracic extension and overall postural correction in standing with a height measurement of 57 3/4 inches   Time 8   Period Weeks   Status On-going     PT LONG TERM GOAL #3   Title Neck pain improvement with driving and home ADLs at 50%   Time 8   Period Weeks   Status On-going     PT LONG TERM GOAL #4   Title Cervical and periscapular muscle strength improved to 4/5 needed for postural correction for ADLs   Time 8   Period Weeks   Status On-going     PT LONG TERM GOAL #5   Title Right cervical rotation of 38 degrees needed for driving   Time 8   Period Weeks   Status On-going     PT LONG TERM GOAL #6   Title FOTO functional outcome score improved to 37% limitation indicating improved function with less pain   Time 8   Period Weeks   Status On-going             Patient will benefit from skilled therapeutic intervention in order to improve the  following deficits and impairments:     Visit Diagnosis: Cervicalgia  Muscle weakness (generalized)  Abnormal posture     Problem List Patient Active Problem List   Diagnosis Date Noted  . Parkinson disease ([place]) 06/25/2016  . Restless leg syndrome 12/25/2015  . Arthritis 12/25/2015  . Abnormality of gait 05/24/2015  . Intention tremor 12/08/2013  . Occipital infarction ([place]) 07/20/2012  . IRRITABLE BOWEL SYNDROME 01/17/2009  . Cervicalgia 01/17/2009  . INCONTINENCE 09/07/2008  . ANEMIA, B12 DEFICIENCY 04/26/2008  . GERD 04/12/2008  . HYPERLIPIDEMIA 01/26/2008  . TIA 01/26/2008  . Hypothyroidism 01/04/2008  . OSTEOPENIA 01/04/2008    Alvera Singh 07/17/2016, 3:32 PM  [facility] [address], [place], [place], [address] Phone: [phone]   Fax:  [phone]  Name: Elizabeth [name] MRN: [id number] Date of Birth: Feb 10, 1931

## 2016-07-17 NOTE — Therapy (Signed)
[facility] [address], [place], [place], [address] Phone: [phone]   Fax:  [phone]  Physical Therapy Treatment  Patient Details  Name: Elizabeth [name] MRN: [id number] Date of Birth: 12/07/1930 Referring Provider: Wells Guiles Tat  Encounter Date: 07/17/2016      PT End of Session - 07/17/16 1532    Visit Number 5   Number of Visits 10   Date for PT Re-Evaluation 07/24/16   Authorization Type UHC Medicare G codes;  KX at visit 15   PT Start Time 1400   PT Stop Time 1444   PT Time Calculation (min) 44 min   Activity Tolerance Patient tolerated treatment well      Past Medical History:  Diagnosis Date  . Abnormal Pap smear of vagina 09/2006   Dr Nori Riis  . Hyperlipidemia   . Hypothyroidism   . Osteopenia    PMH fracture heel, wrist  . Pernicious anemia   . Post-polio syndrome    minor problems L leg  . Vitamin B12 deficiency     Past Surgical History:  Procedure Laterality Date  . BLADDER SUSPENSION  2000  . no colonoscopy     "I never felt I needed one "    There were no vitals filed for this visit.      Subjective Assessment - 07/17/16 1356    Subjective I feel OK.  I get so tired and it takes me a long time to do anything.   Denies pain except had left neck pain when I got up today.  Turning my head is difficult.     Pertinent History post-polio syndrome;  the doctor says I"m "borderline Parkinson's";  hand tremor x 2 years;  osteopenia (hx of wrist and heel fx)   Currently in Pain? No/denies            [facility] PT Assessment - 07/17/16 0001      AROM   Cervical Flexion 45   Cervical Extension 40   Cervical - Right Side Bend 20   Cervical - Left Side Bend 20   Cervical - Right Rotation 30   Cervical - Left Rotation 35                     [facility] Adult PT Treatment/Exercise - 07/17/16 0001      Neck Exercises: Standing   Wall Push Ups 10 reps   Wall Wash roll ball up wall  10   Other Standing Exercises back against wall with small ball behind head with yellow band ER. HABD, diagonals 5-10 each   Other Standing Exercises standing against wall with yelllow band:  external rotation, horizontal abduction, diagonals 10x each     Knee/Hip Exercises: Aerobic   Nustep Nu-Step L 1 7 min     Manual Therapy   Soft tissue mobilization cervical paraspinals   Manual Traction gentle 5x 20 sec   Muscle Energy Technique right/left upper trap contract/relax 3x 5 sec holds                  PT Short Term Goals - 07/03/16 1702      PT SHORT TERM GOAL #1   Title The patient will demonstrate awareness of postural correction, ostepenia precautions with movement 06/26/16   Status Achieved     PT SHORT TERM GOAL #2   Title Neck pain with turning head, driving and bending over improved by 25%    Time 4  Period Weeks   Status On-going     PT SHORT TERM GOAL #3   Title Cervical right rotation improved to 33 degrees needed for driving   Time 4   Period Weeks   Status On-going           PT Long Term Goals - 07/03/16 1702      PT LONG TERM GOAL #1   Title The patient will be independent in safe, self progression of HEP for further improvements in ROM, strength, posture and pain   07/24/16   Time 8   Period Weeks   Status On-going     PT LONG TERM GOAL #2   Title The patient will have improved thoracic extension and overall postural correction in standing with a height measurement of 57 3/4 inches   Time 8   Period Weeks   Status On-going     PT LONG TERM GOAL #3   Title Neck pain improvement with driving and home ADLs at 50%   Time 8   Period Weeks   Status On-going     PT LONG TERM GOAL #4   Title Cervical and periscapular muscle strength improved to 4/5 needed for postural correction for ADLs   Time 8   Period Weeks   Status On-going     PT LONG TERM GOAL #5   Title Right cervical rotation of 38 degrees needed for driving   Time 8   Period  Weeks   Status On-going     PT LONG TERM GOAL #6   Title FOTO functional outcome score improved to 37% limitation indicating improved function with less pain   Time 8   Period Weeks   Status On-going               Plan - 07/17/16 1533    Clinical Impression Statement The patient has improved cervical AROM in majority of planes.  1/4 inch height increase with postural correction however patient fatigues quickly in standing with forward head and rounded shoulders.  Improved soft tissue length and pain reduction with soft tissue manual therapy.  Therapist providing verbal cues for postural correction and to monitor response to all interventions.     PT Next Visit Plan ERO next visit;  check progress toward goals;  postural strengthening;  manual therapy as needed      Patient will benefit from skilled therapeutic intervention in order to improve the following deficits and impairments:     Visit Diagnosis: Cervicalgia  Muscle weakness (generalized)  Abnormal posture     Problem List Patient Active Problem List   Diagnosis Date Noted  . Parkinson disease ([place]) 06/25/2016  . Restless leg syndrome 12/25/2015  . Arthritis 12/25/2015  . Abnormality of gait 05/24/2015  . Intention tremor 12/08/2013  . Occipital infarction ([place]) 07/20/2012  . IRRITABLE BOWEL SYNDROME 01/17/2009  . Cervicalgia 01/17/2009  . INCONTINENCE 09/07/2008  . ANEMIA, B12 DEFICIENCY 04/26/2008  . GERD 04/12/2008  . HYPERLIPIDEMIA 01/26/2008  . TIA 01/26/2008  . Hypothyroidism 01/04/2008  . OSTEOPENIA 01/04/2008   [name], PT 07/17/16 5:15 PM Phone: [phone] Fax: [phone]  Alvera Singh 07/17/2016, 5:15 PM  [facility] [address] [place], [place], [address] Phone: [phone]   Fax:  [phone]  Name: Elizabeth [name] MRN: [id number] Date of Birth: 05-20-1931

## 2016-07-17 NOTE — Therapy (Signed)
[facility] [address] [place], [place], [address] Phone: [phone]   Fax:  [phone]  Physical Therapy Treatment  Patient Details  Name: Elizabeth [name] MRN: [id number] Date of Birth: Nov 18, 1931 Referring Provider: Wells Guiles Tat  Encounter Date: 07/17/2016      PT End of Session - 07/17/16 1532    Visit Number 5   Number of Visits 10   Date for PT Re-Evaluation 07/24/16   Authorization Type UHC Medicare G codes;  KX at visit 15   PT Start Time 1400   PT Stop Time 1444   PT Time Calculation (min) 44 min   Activity Tolerance Patient tolerated treatment well      Past Medical History:  Diagnosis Date  . Abnormal Pap smear of vagina 09/2006   Dr Nori Riis  . Hyperlipidemia   . Hypothyroidism   . Osteopenia    PMH fracture heel, wrist  . Pernicious anemia   . Post-polio syndrome    minor problems L leg  . Vitamin B12 deficiency     Past Surgical History:  Procedure Laterality Date  . BLADDER SUSPENSION  2000  . no colonoscopy     "I never felt I needed one "    There were no vitals filed for this visit.      Subjective Assessment - 07/17/16 1356    Subjective I feel OK.  I get so tired and it takes me a long time to do anything.   Denies pain except had left neck pain when I got up today.  Turning my head is difficult.     Pertinent History post-polio syndrome;  the doctor says I"m "borderline Parkinson's";  hand tremor x 2 years;  osteopenia (hx of wrist and heel fx)   Currently in Pain? No/denies            [facility] PT Assessment - 07/17/16 0001      AROM   Cervical Flexion 45   Cervical Extension 40   Cervical - Right Side Bend 20   Cervical - Left Side Bend 20   Cervical - Right Rotation 30   Cervical - Left Rotation 35                     [facility] Adult PT Treatment/Exercise - 07/17/16 0001      Neck Exercises: Standing   Wall Push Ups 10 reps   Wall Wash roll ball up wall  10   Other Standing Exercises back against wall with small ball behind head with yellow band ER. HABD, diagonals 5-10 each   Other Standing Exercises standing against wall with yelllow band:  external rotation, horizontal abduction, diagonals 10x each     Knee/Hip Exercises: Aerobic   Nustep Nu-Step L 1 7 min     Manual Therapy   Soft tissue mobilization cervical paraspinals   Manual Traction gentle 5x 20 sec   Muscle Energy Technique right/left upper trap contract/relax 3x 5 sec holds                  PT Short Term Goals - 07/03/16 1702      PT SHORT TERM GOAL #1   Title The patient will demonstrate awareness of postural correction, ostepenia precautions with movement 06/26/16   Status Achieved     PT SHORT TERM GOAL #2   Title Neck pain with turning head, driving and bending over improved by 25%    Time 4  Period Weeks   Status On-going     PT SHORT TERM GOAL #3   Title Cervical right rotation improved to 33 degrees needed for driving   Time 4   Period Weeks   Status On-going           PT Long Term Goals - 07/03/16 1702      PT LONG TERM GOAL #1   Title The patient will be independent in safe, self progression of HEP for further improvements in ROM, strength, posture and pain   07/24/16   Time 8   Period Weeks   Status On-going     PT LONG TERM GOAL #2   Title The patient will have improved thoracic extension and overall postural correction in standing with a height measurement of 57 3/4 inches   Time 8   Period Weeks   Status On-going     PT LONG TERM GOAL #3   Title Neck pain improvement with driving and home ADLs at 50%   Time 8   Period Weeks   Status On-going     PT LONG TERM GOAL #4   Title Cervical and periscapular muscle strength improved to 4/5 needed for postural correction for ADLs   Time 8   Period Weeks   Status On-going     PT LONG TERM GOAL #5   Title Right cervical rotation of 38 degrees needed for driving   Time 8   Period  Weeks   Status On-going     PT LONG TERM GOAL #6   Title FOTO functional outcome score improved to 37% limitation indicating improved function with less pain   Time 8   Period Weeks   Status On-going             Patient will benefit from skilled therapeutic intervention in order to improve the following deficits and impairments:     Visit Diagnosis: Cervicalgia  Muscle weakness (generalized)  Abnormal posture     Problem List Patient Active Problem List   Diagnosis Date Noted  . Parkinson disease ([place]) 06/25/2016  . Restless leg syndrome 12/25/2015  . Arthritis 12/25/2015  . Abnormality of gait 05/24/2015  . Intention tremor 12/08/2013  . Occipital infarction ([place]) 07/20/2012  . IRRITABLE BOWEL SYNDROME 01/17/2009  . Cervicalgia 01/17/2009  . INCONTINENCE 09/07/2008  . ANEMIA, B12 DEFICIENCY 04/26/2008  . GERD 04/12/2008  . HYPERLIPIDEMIA 01/26/2008  . TIA 01/26/2008  . Hypothyroidism 01/04/2008  . OSTEOPENIA 01/04/2008    Alvera Singh 07/17/2016, 3:33 PM  [facility] [address], [place], [place], [address] Phone: [phone]   Fax:  [phone]  Name: Elizabeth [name] MRN: [id number] Date of Birth: 12/13/1930

## 2016-07-18 ENCOUNTER — Other Ambulatory Visit: Payer: Self-pay | Admitting: Internal Medicine

## 2016-07-23 ENCOUNTER — Encounter: Payer: Medicare Other | Admitting: Internal Medicine

## 2016-07-23 NOTE — Progress Notes (Signed)
Subjective:    Patient ID: Elizabeth [name], female    DOB: 28-Sep-1931, 82 y.o.   MRN: [id number]  HPI The patient is here for follow up.  B12 deficiency:  She gets monthly B12 injections.  Hypothyroidism:  She is taking her medication daily.  She denies any recent changes in energy or weight that are unexplained.   Hyperlipidemia: She is taking her medication daily. She is compliant with a low fat/cholesterol diet. She is exercising regularly. She denies myalgias.   Osteoarthritis, neck pain from disc bulge/arthritis:  She takes celebrex as needed.  She is currently doing PT for her neck.   RLS, tremor:  She is following with Dr Tat.     Medications and allergies reviewed with patient and updated if appropriate.  Patient Active Problem List   Diagnosis Date Noted  . Parkinson disease ([place]) 06/25/2016  . Restless leg syndrome 12/25/2015  . Arthritis 12/25/2015  . Abnormality of gait 05/24/2015  . Intention tremor 12/08/2013  . Occipital infarction ([place]) 07/20/2012  . IRRITABLE BOWEL SYNDROME 01/17/2009  . Cervicalgia 01/17/2009  . INCONTINENCE 09/07/2008  . ANEMIA, B12 DEFICIENCY 04/26/2008  . GERD 04/12/2008  . HYPERLIPIDEMIA 01/26/2008  . TIA 01/26/2008  . Hypothyroidism 01/04/2008  . OSTEOPENIA 01/04/2008    Current Outpatient Prescriptions on File Prior to Visit  Medication Sig Dispense Refill  . aspirin 81 MG tablet Take 81 mg by mouth daily.      . Calcium Carbonate-Vitamin D (CALCIUM + D PO) Take 1 tablet by mouth daily. Reported on 12/25/2015    . carbidopa-levodopa (SINEMET IR) 25-100 MG tablet Take 1 tablet by mouth 3 (three) times daily. 90 tablet 1  . cholecalciferol (VITAMIN D) 1000 UNITS tablet Take 1,000 Units by mouth daily.      . Cyanocobalamin (VITAMIN B-12 IJ) Inject as directed.    Marland Kitchen levothyroxine (SYNTHROID, LEVOTHROID) 25 MCG tablet Take 1 tablet (25 mcg total) by mouth daily. 90 tablet 0  . pramipexole (MIRAPEX) 0.125 MG tablet Take 1 tablet  (0.125 mg total) by mouth 2 (two) times daily. 180 tablet 1  . rosuvastatin (CRESTOR) 10 MG tablet TAKE (1) TABLET ON MONDAY, WEDNESDAY AND FRIDAY. 30 tablet 3  . vitamin E 400 UNIT capsule Take 400 Units by mouth daily.      No current facility-administered medications on file prior to visit.     Past Medical History:  Diagnosis Date  . Abnormal Pap smear of vagina 09/2006   Dr Nori Riis  . Hyperlipidemia   . Hypothyroidism   . Osteopenia    PMH fracture heel, wrist  . Pernicious anemia   . Post-polio syndrome    minor problems L leg  . Vitamin B12 deficiency     Past Surgical History:  Procedure Laterality Date  . BLADDER SUSPENSION  2000  . no colonoscopy     "I never felt I needed one "    Social History   Social History  . Marital status: Single    Spouse name: N/A  . Number of children: N/A  . Years of education: N/A   Social History Main Topics  . Smoking status: Never Smoker  . Smokeless tobacco: Not on file  . Alcohol use 0.0 oz/week     Comment: wine very rarely (at Christmas)  . Drug use: No  . Sexual activity: Not on file   Other Topics Concern  . Not on file   Social History Narrative  . No narrative on file  Family History  Problem Relation Age of Onset  . Breast cancer Maternal Grandmother   . Cancer Maternal Grandmother     breast  . Heart attack Maternal Grandfather     43s  . Emphysema Mother   . Hypertension Mother   . Alzheimer's disease Father   . Diabetes Neg Hx   . Stroke Neg Hx   . Parkinson's disease Neg Hx     Review of Systems     Objective:  There were no vitals filed for this visit. There were no vitals filed for this visit. There is no height or weight on file to calculate BMI.   Physical Exam       Assessment & Plan:    See Problem List for Assessment and Plan of chronic medical problems.    This encounter was created in error - please disregard.

## 2016-07-24 ENCOUNTER — Ambulatory Visit: Payer: Medicare Other | Admitting: Physical Therapy

## 2016-07-24 DIAGNOSIS — M6281 Muscle weakness (generalized): Secondary | ICD-10-CM

## 2016-07-24 DIAGNOSIS — M542 Cervicalgia: Secondary | ICD-10-CM | POA: Diagnosis not present

## 2016-07-24 DIAGNOSIS — R293 Abnormal posture: Secondary | ICD-10-CM

## 2016-07-24 NOTE — Therapy (Signed)
[facility] [address] [place], [place], [address] Phone: [phone]   Fax:  [phone]  Physical Therapy Treatment/Recertification  Patient Details  Name: Elizabeth [name] MRN: [id number] Date of Birth: 1931-07-24 Referring Provider: Lurena Joiner Tat  Encounter Date: 07/24/2016      PT End of Session - 07/24/16 1650    Visit Number 6   Number of Visits 10   Date for PT Re-Evaluation 09/18/16   Authorization Type UHC Medicare G codes;  KX at visit 15   PT Start Time 1615   PT Stop Time 1653   PT Time Calculation (min) 38 min   Activity Tolerance Patient tolerated treatment well      Past Medical History:  Diagnosis Date  . Abnormal Pap smear of vagina 09/2006   Dr Jennette Kettle  . Hyperlipidemia   . Hypothyroidism   . Osteopenia    PMH fracture heel, wrist  . Pernicious anemia   . Post-polio syndrome    minor problems L leg  . Vitamin B12 deficiency     Past Surgical History:  Procedure Laterality Date  . BLADDER SUSPENSION  2000  . no colonoscopy     "I never felt I needed one "    There were no vitals filed for this visit.      Subjective Assessment - 07/24/16 1637    Subjective The right side of my neck hurts.  Overall patient reports she is 30-50% better.  She reports continued LE pain when lying down and even sitting.     Currently in Pain? Yes   Pain Score 5    Pain Location Neck   Pain Orientation Right            [facility] PT Assessment - 07/24/16 0001      AROM   Cervical Flexion 45   Cervical Extension 40   Cervical - Right Side Bend 20   Cervical - Left Side Bend 20   Cervical - Right Rotation 30   Cervical - Left Rotation 35     Strength   Cervical Flexion 3/5   Cervical Extension 3/5                     [facility] Adult PT Treatment/Exercise - 07/24/16 0001      Neck Exercises: Theraband   Other Theraband Exercises yellow band over the door pulldowns 15x     Neck  Exercises: Standing   Wall Push Ups 10 reps   Wall Wash roll ball up wall 10   Other Standing Exercises back against wall with small ball behind head with yellow band ER. HABD, diagonals 5-10 each   Other Standing Exercises standing against wall with yelllow band:  external rotation, horizontal abduction, diagonals 10x each     Neck Exercises: Seated   Other Seated Exercise yellow band rows and shoulder extensions 10x each     Knee/Hip Exercises: Standing   SLS with Vectors 3 ways 5x right and left     Shoulder Exercises: Standing   ABduction Strengthening;Right;Left;10 reps  isometric   Extension Strengthening;Right;Left;10 reps  isometric     Manual Therapy   Soft tissue mobilization cervical paraspinals   Manual Traction gentle 5x 20 sec                  PT Short Term Goals - 07/24/16 1703      PT SHORT TERM GOAL #1   Title The patient will demonstrate  awareness of postural correction, ostepenia precautions with movement 06/26/16   Status Achieved     PT SHORT TERM GOAL #2   Title Neck pain with turning head, driving and bending over improved by 25%    Status Achieved     PT SHORT TERM GOAL #3   Title Cervical right rotation improved to 33 degrees needed for driving   Time 4   Period Weeks   Status On-going           PT Long Term Goals - 07/24/16 1703      PT LONG TERM GOAL #1   Title The patient will be independent in safe, self progression of HEP for further improvements in ROM, strength, posture and pain   07/24/16   Time 8   Period Weeks   Status On-going     PT LONG TERM GOAL #2   Title The patient will have improved thoracic extension and overall postural correction in standing with a height measurement of 57 3/4 inches   Time 8   Period Weeks   Status On-going     PT LONG TERM GOAL #3   Title Neck pain improvement with driving and home ADLs at 50%   Time 8   Period Weeks   Status On-going     PT LONG TERM GOAL #4   Title Cervical and  periscapular muscle strength improved to 4/5 needed for postural correction for ADLs   Time 8   Period Weeks   Status On-going     PT LONG TERM GOAL #5   Title Right cervical rotation of 38 degrees needed for driving   Time 8   Period Weeks   Status On-going     PT LONG TERM GOAL #6   Title FOTO functional outcome score improved to 37% limitation indicating improved function with less pain   Time 8   Period Weeks   Status On-going               Plan - 07/24/16 1653    Clinical Impression Statement The patient reports an overall improvement of 30-50%.  She continues to limited in cervical ROM by degenerative and postural changes.  She is limited in her supine and sitting tolerance secondary to LE pain, better with standing   Needs verbal cues to hold head up, fatigues quickly.  Progressing with rehab goals.  She would benefit from continued PT for postural strengthening, progressive ROM, patient education to establish progressive HEP and pain management.     Rehab Potential Good   Clinical Impairments Affecting Rehab Potential osteopenia;  falls;  post-polio syndrome;  ? Parkinson's;  restless legs   PT Frequency 1x / week   PT Duration 8 weeks   PT Treatment/Interventions ADLs/Self Care Home Management;Cryotherapy;Electrical Stimulation;Moist Heat;Ultrasound;Traction;Therapeutic exercise;Therapeutic activities;Neuromuscular re-education;Patient/family education;Manual techniques;Dry needling;Taping   PT Next Visit Plan   postural strengthening in standing;  manual therapy as needed;  cervical ROM      Patient will benefit from skilled therapeutic intervention in order to improve the following deficits and impairments:  Decreased range of motion, Decreased strength, Increased muscle spasms, Postural dysfunction, Pain  Visit Diagnosis: Cervicalgia - Plan: PT plan of care cert/re-cert  Muscle weakness (generalized) - Plan: PT plan of care cert/re-cert  Abnormal posture -  Plan: PT plan of care cert/re-cert     Problem List Patient Active Problem List   Diagnosis Date Noted  . Parkinson disease ([place]) 06/25/2016  . Restless leg syndrome 12/25/2015  .  Arthritis 12/25/2015  . Abnormality of gait 05/24/2015  . Intention tremor 12/08/2013  . Occipital infarction ([place]) 07/20/2012  . IRRITABLE BOWEL SYNDROME 01/17/2009  . Cervicalgia 01/17/2009  . INCONTINENCE 09/07/2008  . ANEMIA, B12 DEFICIENCY 04/26/2008  . GERD 04/12/2008  . HYPERLIPIDEMIA 01/26/2008  . TIA 01/26/2008  . Hypothyroidism 01/04/2008  . OSTEOPENIA 01/04/2008    [name], PT 07/24/16 5:08 PM Phone: [phone] Fax: [phone]  Alvera Singh 07/24/2016, 5:07 PM  [facility] [address], [place], [place], [address] Phone: [phone]   Fax:  [phone]  Name: Elizabeth [name] MRN: [id number] Date of Birth: 07-27-1931

## 2016-08-06 ENCOUNTER — Other Ambulatory Visit (INDEPENDENT_AMBULATORY_CARE_PROVIDER_SITE_OTHER): Payer: Medicare Other

## 2016-08-06 ENCOUNTER — Encounter: Payer: Self-pay | Admitting: Internal Medicine

## 2016-08-06 ENCOUNTER — Ambulatory Visit (INDEPENDENT_AMBULATORY_CARE_PROVIDER_SITE_OTHER): Payer: Medicare Other | Admitting: Internal Medicine

## 2016-08-06 VITALS — BP 106/64 | HR 76 | Temp 98.1°F | Resp 16 | Wt 97.0 lb

## 2016-08-06 DIAGNOSIS — D518 Other vitamin B12 deficiency anemias: Secondary | ICD-10-CM | POA: Diagnosis not present

## 2016-08-06 DIAGNOSIS — E782 Mixed hyperlipidemia: Secondary | ICD-10-CM

## 2016-08-06 DIAGNOSIS — E039 Hypothyroidism, unspecified: Secondary | ICD-10-CM | POA: Diagnosis not present

## 2016-08-06 DIAGNOSIS — R269 Unspecified abnormalities of gait and mobility: Secondary | ICD-10-CM | POA: Diagnosis not present

## 2016-08-06 DIAGNOSIS — Z8673 Personal history of transient ischemic attack (TIA), and cerebral infarction without residual deficits: Secondary | ICD-10-CM

## 2016-08-06 DIAGNOSIS — G2581 Restless legs syndrome: Secondary | ICD-10-CM

## 2016-08-06 LAB — COMPREHENSIVE METABOLIC PANEL WITH GFR
ALT: 15 U/L (ref 0–35)
AST: 24 U/L (ref 0–37)
Albumin: 4 g/dL (ref 3.5–5.2)
Alkaline Phosphatase: 81 U/L (ref 39–117)
BUN: 19 mg/dL (ref 6–23)
CO2: 29 meq/L (ref 19–32)
Calcium: 8.8 mg/dL (ref 8.4–10.5)
Chloride: 108 meq/L (ref 96–112)
Creatinine, Ser: 0.8 mg/dL (ref 0.40–1.20)
GFR: 72.72 mL/min
Glucose, Bld: 97 mg/dL (ref 70–99)
Potassium: 3.9 meq/L (ref 3.5–5.1)
Sodium: 143 meq/L (ref 135–145)
Total Bilirubin: 1.2 mg/dL (ref 0.2–1.2)
Total Protein: 6.6 g/dL (ref 6.0–8.3)

## 2016-08-06 LAB — LIPID PANEL
CHOLESTEROL: 145 mg/dL (ref 0–200)
HDL: 55 mg/dL (ref >39.00)
LDL CALC: 61 mg/dL (ref 0–99)
NonHDL: 89.94
TRIGLYCERIDES: 146 mg/dL (ref 0.0–149.0)
Total CHOL/HDL Ratio: 3
VLDL: 29.2 mg/dL (ref 0.0–40.0)

## 2016-08-06 LAB — TSH: TSH: 4.8 u[IU]/mL — ABNORMAL HIGH (ref 0.35–4.50)

## 2016-08-06 MED ORDER — VITAMIN B-12 1000 MCG PO TABS: 1000.0000 ug | ORAL_TABLET | Freq: Every day | ORAL | 3 refills | Status: DC

## 2016-08-06 MED ORDER — CYANOCOBALAMIN 1000 MCG/ML IJ SOLN
1000.0000 ug | Freq: Once | INTRAMUSCULAR | Status: AC
  Administered 2016-08-06: 1000 ug via INTRAMUSCULAR

## 2016-08-06 NOTE — Assessment & Plan Note (Signed)
Doing PT.

## 2016-08-06 NOTE — Assessment & Plan Note (Signed)
Continue ASA, statin. ?

## 2016-08-06 NOTE — Progress Notes (Signed)
Subjective:    Patient ID: Elizabeth [name], female    DOB: 05-30-1931, 82 y.o.   MRN: [id number]  HPI She is here for follow up.  Hypothyroidism:  She is taking her medication daily.  She denies any recent changes in energy or weight that are unexplained.   Hyperlipidemia: She is taking her medication daily. She is compliant with a low fat/cholesterol diet.  She denies myalgias.   Parkinson's disease, RLS:  She is following with Dr Tat.  She is doing PT for her balance and strength and she feels it is helping.  She was started on sinemet and she has not noticed much difference with the medication.    B12 deficiency:  She needs her B12 injection today.  She wonders if she can do oral B12 over the winter so she does not need to come in monthly.  She still drives, but would rather not drive in the winter.   Medications and allergies reviewed with patient and updated if appropriate.  Patient Active Problem List   Diagnosis Date Noted  . Parkinson disease ([place]) 06/25/2016  . Restless leg syndrome 12/25/2015  . Arthritis 12/25/2015  . Abnormality of gait 05/24/2015  . Intention tremor 12/08/2013  . Occipital infarction ([place]) 07/20/2012  . IRRITABLE BOWEL SYNDROME 01/17/2009  . Cervicalgia 01/17/2009  . INCONTINENCE 09/07/2008  . ANEMIA, B12 DEFICIENCY 04/26/2008  . GERD 04/12/2008  . HYPERLIPIDEMIA 01/26/2008  . TIA 01/26/2008  . Hypothyroidism 01/04/2008  . OSTEOPENIA 01/04/2008    Current Outpatient Prescriptions on File Prior to Visit  Medication Sig Dispense Refill  . aspirin 81 MG tablet Take 81 mg by mouth daily.      . Calcium Carbonate-Vitamin D (CALCIUM + D PO) Take 1 tablet by mouth daily. Reported on 12/25/2015    . carbidopa-levodopa (SINEMET IR) 25-100 MG tablet Take 1 tablet by mouth 3 (three) times daily. 90 tablet 1  . cholecalciferol (VITAMIN D) 1000 UNITS tablet Take 1,000 Units by mouth daily.      . Cyanocobalamin (VITAMIN B-12 IJ) Inject as directed.    Marland Kitchen  levothyroxine (SYNTHROID, LEVOTHROID) 25 MCG tablet Take 1 tablet (25 mcg total) by mouth daily. 90 tablet 0  . pramipexole (MIRAPEX) 0.125 MG tablet Take 1 tablet (0.125 mg total) by mouth 2 (two) times daily. 180 tablet 1  . rosuvastatin (CRESTOR) 10 MG tablet TAKE (1) TABLET ON MONDAY, WEDNESDAY AND FRIDAY. 30 tablet 3  . vitamin E 400 UNIT capsule Take 400 Units by mouth daily.      No current facility-administered medications on file prior to visit.     Past Medical History:  Diagnosis Date  . Abnormal Pap smear of vagina 09/2006   Dr Nori Riis  . Hyperlipidemia   . Hypothyroidism   . Osteopenia    PMH fracture heel, wrist  . Pernicious anemia   . Post-polio syndrome    minor problems L leg  . Vitamin B12 deficiency     Past Surgical History:  Procedure Laterality Date  . BLADDER SUSPENSION  2000  . no colonoscopy     "I never felt I needed one "    Social History   Social History  . Marital status: Single    Spouse name: N/A  . Number of children: N/A  . Years of education: N/A   Social History Main Topics  . Smoking status: Never Smoker  . Smokeless tobacco: Not on file  . Alcohol use 0.0 oz/week  Comment: wine very rarely (at Christmas)  . Drug use: No  . Sexual activity: Not on file   Other Topics Concern  . Not on file   Social History Narrative  . No narrative on file    Family History  Problem Relation Age of Onset  . Breast cancer Maternal Grandmother   . Cancer Maternal Grandmother     breast  . Heart attack Maternal Grandfather     24s  . Emphysema Mother   . Hypertension Mother   . Alzheimer's disease Father   . Diabetes Neg Hx   . Stroke Neg Hx   . Parkinson's disease Neg Hx     Review of Systems  Constitutional: Negative for fever.  Respiratory: Negative for cough, shortness of breath and wheezing.   Cardiovascular: Positive for leg swelling (mild). Negative for chest pain and palpitations.  Gastrointestinal: Negative for  abdominal pain.       No gerd  Neurological: Negative for dizziness, light-headedness and headaches.  Psychiatric/Behavioral: Negative for dysphoric mood. The patient is not nervous/anxious.        Objective:   Vitals:   08/06/16 1625  BP: 106/64  Pulse: 76  Resp: 16  Temp: 98.1 F (36.7 C)   Filed Weights   08/06/16 1625  Weight: 97 lb (44 kg)   Body mass index is 18.94 kg/m.   Physical Exam Constitutional: Appears well-developed and well-nourished. No distress.  HENT:  Head: Normocephalic and atraumatic.  Neck: Neck supple. No tracheal deviation present. No thyromegaly present.  Cardiovascular: Normal rate, regular rhythm and normal heart sounds.   No murmur heard. No carotid bruit  Pulmonary/Chest: Effort normal and breath sounds normal. No respiratory distress. No has no wheezes. No rales.  Musculoskeletal: No edema.  Lymphadenopathy: No cervical adenopathy.  Skin: Skin is warm and dry. Not diaphoretic.  Psychiatric: Normal mood and affect. Behavior is normal.       Assessment & Plan:   See Problem List for Assessment and Plan of chronic medical problems.   F/u in 6 months

## 2016-08-06 NOTE — Patient Instructions (Addendum)
  Test(s) ordered today. Your results will be released to [place] (or called to you) after review, usually within 72hours after test completion. If any changes need to be made, you will be notified at that same time.  All other Health Maintenance issues reviewed.   All recommended immunizations and age-appropriate screenings are up-to-date or discussed.  No immunizations administered today.  B12 injection was given today.  Medications reviewed and updated.  No changes recommended at this time.   Please followup in 6 months

## 2016-08-06 NOTE — Assessment & Plan Note (Signed)
B12 injection today Would like to try oral B12 over the winter - rx sent to pharmacy - will monitor B12 level at next visit

## 2016-08-06 NOTE — Assessment & Plan Note (Signed)
Check cmp, lipid panel Continue crestor 10 mg

## 2016-08-06 NOTE — Progress Notes (Signed)
Pre visit review using our clinic review tool, if applicable. No additional management support is needed unless otherwise documented below in the visit note. 

## 2016-08-06 NOTE — Assessment & Plan Note (Signed)
Continue ASA and statin  

## 2016-08-06 NOTE — Assessment & Plan Note (Signed)
Check tsh  Titrate med dose if needed  

## 2016-08-07 ENCOUNTER — Ambulatory Visit: Payer: Medicare Other | Admitting: Neurology

## 2016-08-07 ENCOUNTER — Ambulatory Visit: Payer: Medicare Other | Attending: Neurology | Admitting: Physical Therapy

## 2016-08-07 DIAGNOSIS — M6281 Muscle weakness (generalized): Secondary | ICD-10-CM | POA: Insufficient documentation

## 2016-08-07 DIAGNOSIS — M542 Cervicalgia: Secondary | ICD-10-CM

## 2016-08-07 DIAGNOSIS — R293 Abnormal posture: Secondary | ICD-10-CM | POA: Insufficient documentation

## 2016-08-07 NOTE — Therapy (Signed)
[facility] [address] [place], [place], [address] Phone: [phone]   Fax:  [phone]  Physical Therapy Treatment  Patient Details  Name: Elizabeth [name] MRN: [id number] Date of Birth: 1930/12/14 Referring Provider: Wells Guiles Tat  Encounter Date: 08/07/2016      PT End of Session - 08/07/16 1511    Visit Number 7   Number of Visits 10   Date for PT Re-Evaluation 09/18/16   Authorization Type UHC Medicare G codes;  KX at visit 15   PT Start Time 1438   PT Stop Time 1520   PT Time Calculation (min) 42 min   Activity Tolerance Patient tolerated treatment well      Past Medical History:  Diagnosis Date  . Abnormal Pap smear of vagina 09/2006   Dr Nori Riis  . Hyperlipidemia   . Hypothyroidism   . Osteopenia    PMH fracture heel, wrist  . Pernicious anemia   . Post-polio syndrome    minor problems L leg  . Vitamin B12 deficiency     Past Surgical History:  Procedure Laterality Date  . BLADDER SUSPENSION  2000  . no colonoscopy     "I never felt I needed one "    There were no vitals filed for this visit.      Subjective Assessment - 08/07/16 1438    Subjective I'm doing been doing pretty.  Been playing bridge, Scrabble and bingo.  Denies pain today.  Went for MD follow up yesterday and my bloodwork was fine.  I try to remember to hold myself up.     Pertinent History post-polio syndrome;  the doctor says I"m "borderline Parkinson's";  hand tremor x 2 years;  osteopenia (hx of wrist and heel fx)   Currently in Pain? No/denies   Pain Score 0-No pain   Pain Location Neck                         [facility] Adult PT Treatment/Exercise - 08/07/16 0001      Neck Exercises: Standing   Wall Wash roll ball up wall 10     Shoulder Exercises: Standing   Extension Strengthening;Both;20 reps;Theraband   Theraband Level (Shoulder Extension) Level 1 (Yellow)   Row Strengthening;Both;15 reps;Theraband    Theraband Level (Shoulder Row) Level 1 (Yellow)   Other Standing Exercises resisted 17# backward walk 5x   Other Standing Exercises holding blue foam oval pad against head with yellow band ER, horiz abduction, elevation 12x each     Shoulder Exercises: ROM/Strengthening   Wall Pushups 15 reps   Other ROM/Strengthening Exercises nu-Step L1 5 min   Other ROM/Strengthening Exercises seated thoracic extension over towel roll     Manual Therapy   Soft tissue mobilization cervical paraspinals   Manual Traction gentle 5x 20 sec   Muscle Energy Technique right/left upper trap contract/relax 3x 5 sec holds                  PT Short Term Goals - 08/07/16 1846      PT SHORT TERM GOAL #1   Title The patient will demonstrate awareness of postural correction, ostepenia precautions with movement 06/26/16   Status Achieved     PT SHORT TERM GOAL #2   Title Neck pain with turning head, driving and bending over improved by 25%    Status Achieved     PT SHORT TERM GOAL #3   Time 4  Period Weeks   Status On-going           PT Long Term Goals - 08/07/16 1846      PT LONG TERM GOAL #1   Title The patient will be independent in safe, self progression of HEP for further improvements in ROM, strength, posture and pain   07/24/16   Time 8   Period Weeks   Status On-going     PT LONG TERM GOAL #2   Title The patient will have improved thoracic extension and overall postural correction in standing with a height measurement of 57 3/4 inches   Time 8   Period Weeks   Status On-going     PT LONG TERM GOAL #3   Title Neck pain improvement with driving and home ADLs at 50%   Time 8   Period Weeks   Status On-going     PT LONG TERM GOAL #4   Title Cervical and periscapular muscle strength improved to 4/5 needed for postural correction for ADLs   Time 8   Period Weeks   Status On-going     PT LONG TERM GOAL #5   Title Right cervical rotation of 38 degrees needed for driving    Time 8   Period Weeks   Status On-going     PT LONG TERM GOAL #6   Title FOTO functional outcome score improved to 37% limitation indicating improved function with less pain   Time 8   Period Weeks   Status On-going               Plan - 08/07/16 1512    Clinical Impression Statement The patient is able to participate in standing postural strengthening exercises but continues to need moderate verbal cues for neutral head position.  She has difficulty with supine and sitting positions secondary to restless leg pain.  Decreased cervical pain today.  Improving cervical and thoracic ROM.     Clinical Impairments Affecting Rehab Potential osteopenia;  falls;  post-polio syndrome;  ? Parkinson's;  restless legs   PT Next Visit Plan   postural strengthening in standing;  manual therapy as needed;  cervical ROM;  add ankle weights to standing hip ex's      Patient will benefit from skilled therapeutic intervention in order to improve the following deficits and impairments:     Visit Diagnosis: Cervicalgia  Muscle weakness (generalized)  Abnormal posture     Problem List Patient Active Problem List   Diagnosis Date Noted  . Parkinson disease ([place]) 06/25/2016  . Restless leg syndrome 12/25/2015  . Arthritis 12/25/2015  . Abnormality of gait 05/24/2015  . Intention tremor 12/08/2013  . History of CVA (cerebrovascular accident) 07/20/2012  . IRRITABLE BOWEL SYNDROME 01/17/2009  . Cervicalgia 01/17/2009  . INCONTINENCE 09/07/2008  . ANEMIA, B12 DEFICIENCY 04/26/2008  . HYPERLIPIDEMIA 01/26/2008  . History of TIA (transient ischemic attack) 01/26/2008  . Hypothyroidism 01/04/2008  . OSTEOPENIA 01/04/2008   [name], PT 08/07/16 6:48 PM Phone: [phone] Fax: [phone]  Alvera Singh 08/07/2016, 6:48 PM  [facility] [address] [place], [place], [address] Phone: [phone]   Fax:   [phone]  Name: Elizabeth [name] MRN: [id number] Date of Birth: 1931-08-15

## 2016-08-12 NOTE — Telephone Encounter (Signed)
Pt did not come in for her appt on 06/25/16.  Appt is scheduled for March 2018, can we change it to an AWV please?

## 2016-08-12 NOTE — Telephone Encounter (Signed)
done

## 2016-08-14 ENCOUNTER — Ambulatory Visit: Payer: Medicare Other | Admitting: Physical Therapy

## 2016-08-14 DIAGNOSIS — M6281 Muscle weakness (generalized): Secondary | ICD-10-CM

## 2016-08-14 DIAGNOSIS — M542 Cervicalgia: Secondary | ICD-10-CM | POA: Diagnosis not present

## 2016-08-14 DIAGNOSIS — R293 Abnormal posture: Secondary | ICD-10-CM

## 2016-08-14 NOTE — Patient Instructions (Signed)
Elizabeth [name] PT [facility] [address], [address] [place], [place] [address] Phone # [phone] Fax [phone]    

## 2016-08-14 NOTE — Therapy (Signed)
[facility] [address], [place], [place], [address] Phone: [phone]   Fax:  [phone]  Physical Therapy Treatment  Patient Details  Name: Elizabeth [name] MRN: [id number] Date of Birth: [birth year] Referring Provider: Wells Guiles Tat  Encounter Date: 08/14/2016      PT End of Session - 08/14/16 1859    Visit Number 8   Number of Visits 10   Date for PT Re-Evaluation 09/18/16   Authorization Type UHC Medicare G codes;  KX at visit 15   PT Start Time 1405   PT Stop Time 1445   PT Time Calculation (min) 40 min   Activity Tolerance Patient tolerated treatment well      Past Medical History:  Diagnosis Date  . Abnormal Pap smear of vagina 09/2006   Dr Nori Riis  . Hyperlipidemia   . Hypothyroidism   . Osteopenia    PMH fracture heel, wrist  . Pernicious anemia   . Post-polio syndrome    minor problems L leg  . Vitamin B12 deficiency     Past Surgical History:  Procedure Laterality Date  . BLADDER SUSPENSION  2000  . no colonoscopy     "I never felt I needed one "    There were no vitals filed for this visit.      Subjective Assessment - 08/14/16 1409    Subjective Some neck pain today.  I had a hard time yesterday when I went to the bank and other errands physically and mentally tired.  Less tightness with turning head to drive.     Pertinent History post-polio syndrome;  the doctor says I"m "borderline Parkinson's";  hand tremor x 2 years;  osteopenia (hx of wrist and heel fx)   Currently in Pain? Yes   Pain Score 3    Pain Location Neck   Pain Orientation Right   Pain Type Chronic pain                         [facility] Adult PT Treatment/Exercise - 08/14/16 0001      Neck Exercises: Seated   Other Seated Exercise thoracic extension over foam roll 10x     Neck Exercises: Supine   Other Supine Exercise shoulder extension isometrics 10x     Knee/Hip Exercises: Aerobic   Nustep Nu-Step  L1 5 min     Knee/Hip Exercises: Standing   Hip Flexion Stengthening;Right;Left;10 reps;Knee straight   Hip Flexion Limitations red band   Hip ADduction Strengthening;Right;Left;1 set;10 reps   Hip ADduction Limitations red band   Hip Extension Stengthening;Right;Left;10 reps;Knee straight   Extension Limitations red band   Stairs one at a time descending 1x   Other Standing Knee Exercises 20# resisted backward walk 2x with close supervision for safety     Manual Therapy   Soft tissue mobilization cervical paraspinals   Manual Traction gentle 5x 20 sec   Muscle Energy Technique right/left upper trap contract/relax 3x 5 sec holds                PT Education - 08/14/16 1436    Education provided Yes   Education Details red band standing hip abduction, extension   Person(s) Educated Patient   Methods Explanation;Demonstration;Handout   Comprehension Verbalized understanding;Returned demonstration          PT Short Term Goals - 08/14/16 1904      PT SHORT TERM GOAL #1   Title The patient will demonstrate  awareness of postural correction, ostepenia precautions with movement 06/26/16   Status Achieved     PT SHORT TERM GOAL #2   Title Neck pain with turning head, driving and bending over improved by 25%    Status Achieved     PT SHORT TERM GOAL #3   Title Cervical right rotation improved to 33 degrees needed for driving   Time 4   Period Weeks   Status On-going           PT Long Term Goals - 08/14/16 1904      PT LONG TERM GOAL #1   Title The patient will be independent in safe, self progression of HEP for further improvements in ROM, strength, posture and pain   07/24/16   Time 8   Period Weeks   Status On-going     PT LONG TERM GOAL #2   Title The patient will have improved thoracic extension and overall postural correction in standing with a height measurement of 57 3/4 inches   Time 8   Period Weeks   Status On-going     PT LONG TERM GOAL #3    Title Neck pain improvement with driving and home ADLs at 50%   Time 8   Period Weeks   Status On-going     PT LONG TERM GOAL #4   Title Cervical and periscapular muscle strength improved to 4/5 needed for postural correction for ADLs   Time 8   Period Weeks   Status On-going     PT LONG TERM GOAL #5   Title Right cervical rotation of 38 degrees needed for driving   Time 8   Period Weeks   Status On-going     PT LONG TERM GOAL #6   Title FOTO functional outcome score improved to 37% limitation indicating improved function with less pain   Time 8   Period Weeks   Status On-going               Plan - 08/14/16 1900    Clinical Impression Statement The patient is able to progress standing postural strengthening ex's with increased resistance.  Difficulty with backward walking even with light resistance.  Close supervision for safety.  The patient continues to need moderate cues to hold head up in sitting and standing.     PT Next Visit Plan   postural strengthening in standing;  manual therapy as needed;  cervical ROM especially rotation;  resisted walking backwards      Patient will benefit from skilled therapeutic intervention in order to improve the following deficits and impairments:     Visit Diagnosis: Cervicalgia  Muscle weakness (generalized)  Abnormal posture     Problem List Patient Active Problem List   Diagnosis Date Noted  . Parkinson disease ([place]) 06/25/2016  . Restless leg syndrome 12/25/2015  . Arthritis 12/25/2015  . Abnormality of gait 05/24/2015  . Intention tremor 12/08/2013  . History of CVA (cerebrovascular accident) 07/20/2012  . IRRITABLE BOWEL SYNDROME 01/17/2009  . Cervicalgia 01/17/2009  . INCONTINENCE 09/07/2008  . ANEMIA, B12 DEFICIENCY 04/26/2008  . HYPERLIPIDEMIA 01/26/2008  . History of TIA (transient ischemic attack) 01/26/2008  . Hypothyroidism 01/04/2008  . OSTEOPENIA 01/04/2008  [name], PT 08/14/16 7:06  PM Phone: [phone] Fax: [phone]  Alvera Singh 08/14/2016, 7:05 PM  [facility] [address] [place], [place], [address] Phone: [phone]   Fax:  [phone]  Name: Kymari Buckson Schlup MRN: [id number]  Date of Birth: 05-19-1931

## 2016-08-21 ENCOUNTER — Ambulatory Visit: Payer: Medicare Other | Admitting: Physical Therapy

## 2016-08-21 DIAGNOSIS — M542 Cervicalgia: Secondary | ICD-10-CM | POA: Diagnosis not present

## 2016-08-21 DIAGNOSIS — R293 Abnormal posture: Secondary | ICD-10-CM

## 2016-08-21 DIAGNOSIS — M6281 Muscle weakness (generalized): Secondary | ICD-10-CM

## 2016-08-21 NOTE — Therapy (Signed)
[facility] [address], [place], [place], [address] Phone: [phone]   Fax:  [phone]  Physical Therapy Treatment  Patient Details  Name: Elizabeth [name] MRN: [id number] Date of Birth: 18-May-1931 Referring Provider: Wells Guiles Tat  Encounter Date: 08/21/2016      PT End of Session - 08/21/16 1429    Visit Number 9   Number of Visits 10   Date for PT Re-Evaluation 09/18/16   Authorization Type UHC Medicare G codes;  KX at visit 15   PT Start Time 1355   PT Stop Time 1435   PT Time Calculation (min) 40 min   Activity Tolerance Patient tolerated treatment well      Past Medical History:  Diagnosis Date  . Abnormal Pap smear of vagina 09/2006   Dr Nori Riis  . Hyperlipidemia   . Hypothyroidism   . Osteopenia    PMH fracture heel, wrist  . Pernicious anemia   . Post-polio syndrome    minor problems L leg  . Vitamin B12 deficiency     Past Surgical History:  Procedure Laterality Date  . BLADDER SUSPENSION  2000  . no colonoscopy     "I never felt I needed one "    There were no vitals filed for this visit.      Subjective Assessment - 08/21/16 1403    Subjective Patient arrives 30 min early.  Reports she did all her exercises about 2 hours worth on Tuesday afternoon and had no pain during Bingo Tuesday night.     Currently in Pain? Yes   Pain Score 6    Pain Location Neck   Pain Orientation Right;Left   Pain Type Chronic pain                         [facility] Adult PT Treatment/Exercise - 08/21/16 0001      Neck Exercises: Seated   Cervical Rotation Right;Left;10 reps   Cervical Rotation Limitations in chair in good alignment using cervical plastic flat goniometer to cue   Other Seated Exercise thoracic extension over foam roll 10x     Neck Exercises: Supine   Neck Retraction 10 reps   Other Supine Exercise yellow band scapular exercises 7x each:  overhead, horizontal abduction,  sash, ER 8x each     Knee/Hip Exercises: Aerobic   Nustep Nu-Step L1 5 min     Knee/Hip Exercises: Standing   Heel Raises Both;15 reps   Hip ADduction Strengthening;Right;Left;1 set;10 reps   Hip Extension Stengthening;Right;Left;10 reps;Knee straight   Lateral Step Up Right;Left;5 reps;Hand Hold: 2;Step Height: 6"   Forward Step Up Right;Left;1 set;5 reps   Other Standing Knee Exercises 20# resisted backward walk 2x with close supervision for safety                  PT Short Term Goals - 08/21/16 1442      PT SHORT TERM GOAL #1   Title The patient will demonstrate awareness of postural correction, ostepenia precautions with movement 06/26/16   Status Achieved     PT SHORT TERM GOAL #2   Title Neck pain with turning head, driving and bending over improved by 25%    Status Achieved     PT SHORT TERM GOAL #3   Title Cervical right rotation improved to 33 degrees needed for driving   Time 4   Period Weeks   Status On-going  PT Long Term Goals - 08/21/16 1442      PT LONG TERM GOAL #1   Title The patient will be independent in safe, self progression of HEP for further improvements in ROM, strength, posture and pain   07/24/16   Time 8   Period Weeks   Status On-going     PT LONG TERM GOAL #2   Title The patient will have improved thoracic extension and overall postural correction in standing with a height measurement of 57 3/4 inches   Time 8   Period Weeks   Status On-going     PT LONG TERM GOAL #3   Title Neck pain improvement with driving and home ADLs at 50%   Time 8   Period Weeks   Status On-going     PT LONG TERM GOAL #4   Title Cervical and periscapular muscle strength improved to 4/5 needed for postural correction for ADLs   Time 8   Period Weeks   Status On-going     PT LONG TERM GOAL #5   Title Right cervical rotation of 38 degrees needed for driving   Time 8   Period Weeks   Status On-going     PT LONG TERM GOAL #6   Title  FOTO functional outcome score improved to 37% limitation indicating improved function with less pain   Time 8   Period Weeks   Status On-going               Plan - 08/21/16 1430    Clinical Impression Statement The patient does well with postural strengthening in standing with mild resistance.  Verbal cues for postural alignment of cervical spine with cervical rotation.  Progressing with rehab goals.  Moderate cues to hold head up in standing.     PT Next Visit Plan   postural strengthening in standing;  manual therapy as needed;  cervical ROM especially rotation;  resisted walking backwards      Patient will benefit from skilled therapeutic intervention in order to improve the following deficits and impairments:     Visit Diagnosis: Cervicalgia  Muscle weakness (generalized)  Abnormal posture     Problem List Patient Active Problem List   Diagnosis Date Noted  . Parkinson disease ([place]) 06/25/2016  . Restless leg syndrome 12/25/2015  . Arthritis 12/25/2015  . Abnormality of gait 05/24/2015  . Intention tremor 12/08/2013  . History of CVA (cerebrovascular accident) 07/20/2012  . IRRITABLE BOWEL SYNDROME 01/17/2009  . Cervicalgia 01/17/2009  . INCONTINENCE 09/07/2008  . ANEMIA, B12 DEFICIENCY 04/26/2008  . HYPERLIPIDEMIA 01/26/2008  . History of TIA (transient ischemic attack) 01/26/2008  . Hypothyroidism 01/04/2008  . OSTEOPENIA 01/04/2008   [name], PT 08/21/16 2:47 PM Phone: [phone] Fax: [phone]  Alvera Singh 08/21/2016, 2:46 PM  [facility] [address] [place], [place], [address] Phone: [phone]   Fax:  [phone]  Name: Kameron Bade Delorenzo MRN: [id number] Date of Birth: 07/11/1931

## 2016-08-28 ENCOUNTER — Ambulatory Visit: Payer: Medicare Other | Attending: Neurology | Admitting: Physical Therapy

## 2016-08-28 DIAGNOSIS — M542 Cervicalgia: Secondary | ICD-10-CM | POA: Diagnosis not present

## 2016-08-28 DIAGNOSIS — R293 Abnormal posture: Secondary | ICD-10-CM | POA: Diagnosis present

## 2016-08-28 DIAGNOSIS — M6281 Muscle weakness (generalized): Secondary | ICD-10-CM | POA: Insufficient documentation

## 2016-08-28 NOTE — Therapy (Signed)
[facility] [address], [place], [place], [address] Phone: [phone]   Fax:  [phone]  Physical Therapy Treatment  Patient Details  Name: Elizabeth [name] MRN: [id number] Date of Birth: 07/01/1931 Referring Provider: Wells Guiles Tat  Encounter Date: 08/28/2016      PT End of Session - 08/28/16 1431    Visit Number 10   Number of Visits 20   Date for PT Re-Evaluation 09/18/16   Authorization Type UHC Medicare G codes;  KX at visit 15   PT Start Time 1400   PT Stop Time 1440   PT Time Calculation (min) 40 min   Activity Tolerance Patient tolerated treatment well      Past Medical History:  Diagnosis Date  . Abnormal Pap smear of vagina 09/2006   Dr Nori Riis  . Hyperlipidemia   . Hypothyroidism   . Osteopenia    PMH fracture heel, wrist  . Pernicious anemia   . Post-polio syndrome    minor problems L leg  . Vitamin B12 deficiency     Past Surgical History:  Procedure Laterality Date  . BLADDER SUSPENSION  2000  . no colonoscopy     "I never felt I needed one "    There were no vitals filed for this visit.      Subjective Assessment - 08/28/16 1405    Subjective Right side neck pain today for no apparent reason.     Pertinent History post-polio syndrome;  the doctor says I"m "borderline Parkinson's";  hand tremor x 2 years;  osteopenia (hx of wrist and heel fx)   Currently in Pain? Yes   Pain Score 7    Pain Location Neck   Pain Type Chronic pain            [facility] PT Assessment - 08/28/16 0001      Observation/Other Assessments   Focus on Therapeutic Outcomes (FOTO)  46% limitation     AROM   Cervical Extension 25   Cervical - Right Side Bend 20   Cervical - Left Side Bend 10   Cervical - Right Rotation 30   Cervical - Left Rotation 22                     [facility] Adult PT Treatment/Exercise - 08/28/16 0001      Knee/Hip Exercises: Aerobic   Nustep Nu-Step L1 7 min      Knee/Hip Exercises: Standing   Heel Raises Both;15 reps   Hip Flexion Stengthening;Right;Left;10 reps;Knee straight   Hip Flexion Limitations red band   Hip ADduction Strengthening;Right;Left;1 set;10 reps   Hip ADduction Limitations red band   Lateral Step Up Right;Left;10 reps;Hand Hold: 2;Step Height: 6"   Forward Step Up Right;Left;1 set;10 reps   Other Standing Knee Exercises 20# resisted backward walk 2x with close supervision for safety     Manual Therapy   Soft tissue mobilization cervical paraspinals   Manual Traction gentle 5x 20 sec   Muscle Energy Technique right/left upper trap contract/relax 3x 5 sec holds                  PT Short Term Goals - 08/28/16 1534      PT SHORT TERM GOAL #1   Title The patient will demonstrate awareness of postural correction, ostepenia precautions with movement 06/26/16   Status Achieved     PT SHORT TERM GOAL #2   Title Neck pain with turning head, driving and  bending over improved by 25%    Status Achieved     PT SHORT TERM GOAL #3   Title Cervical right rotation improved to 33 degrees needed for driving   Time 4   Period Weeks   Status On-going           PT Long Term Goals - 08/28/16 1534      PT LONG TERM GOAL #1   Title The patient will be independent in safe, self progression of HEP for further improvements in ROM, strength, posture and pain   07/24/16   Time 8   Period Weeks   Status On-going     PT LONG TERM GOAL #2   Title The patient will have improved thoracic extension and overall postural correction in standing with a height measurement of 57 3/4 inches   Time 8   Period Weeks   Status On-going     PT LONG TERM GOAL #3   Title Neck pain improvement with driving and home ADLs at 50%   Time 8   Period Weeks   Status On-going     PT LONG TERM GOAL #4   Title Cervical and periscapular muscle strength improved to 4/5 needed for postural correction for ADLs   Time 8   Period Weeks   Status On-going      PT LONG TERM GOAL #5   Title Right cervical rotation of 38 degrees needed for driving   Time 8   Period Weeks   Status On-going     PT LONG TERM GOAL #6   Title FOTO functional outcome score improved to 37% limitation indicating improved function with less pain   Time 8   Period Weeks   Status On-going               Plan - 08/28/16 1431    Clinical Impression Statement The patient has increased stiffness in cervical region and increased muscle tightness right > left resulting in decreased cervical sidebending and rotation ROM.  Some improvement in FOTO functional outcome score from 48% limitation to 45% indicating improved function with less pain.  Patient continues to need verbal cues for holding head up especially in standing.  Slower progress toward goals secondary to co-morbidities including osteopenia, Parkinson's and post-polio.  Decreased pain post treatment session.     PT Next Visit Plan   postural strengthening in standing;  manual therapy as needed;  cervical ROM especially rotation;  steps ups;  thoracic extension      Patient will benefit from skilled therapeutic intervention in order to improve the following deficits and impairments:     Visit Diagnosis: Cervicalgia  Muscle weakness (generalized)  Abnormal posture     Problem List Patient Active Problem List   Diagnosis Date Noted  . Parkinson disease ([place]) 06/25/2016  . Restless leg syndrome 12/25/2015  . Arthritis 12/25/2015  . Abnormality of gait 05/24/2015  . Intention tremor 12/08/2013  . History of CVA (cerebrovascular accident) 07/20/2012  . IRRITABLE BOWEL SYNDROME 01/17/2009  . Cervicalgia 01/17/2009  . INCONTINENCE 09/07/2008  . ANEMIA, B12 DEFICIENCY 04/26/2008  . HYPERLIPIDEMIA 01/26/2008  . History of TIA (transient ischemic attack) 01/26/2008  . Hypothyroidism 01/04/2008  . OSTEOPENIA 01/04/2008   [name], PT 08/28/16 3:39 PM Phone: [phone] Fax:  [phone]  Alvera Singh 08/28/2016, 3:39 PM  [facility] [address] [place], [place], [address] Phone: [phone]   Fax:  [phone]  Name: Elizabeth [name]  Elizabeth [name] MRN: [id number] Date of Birth: 1931/09/07

## 2016-09-11 ENCOUNTER — Ambulatory Visit: Payer: Medicare Other | Admitting: Physical Therapy

## 2016-09-11 DIAGNOSIS — M6281 Muscle weakness (generalized): Secondary | ICD-10-CM

## 2016-09-11 DIAGNOSIS — R293 Abnormal posture: Secondary | ICD-10-CM

## 2016-09-11 DIAGNOSIS — M542 Cervicalgia: Secondary | ICD-10-CM

## 2016-09-11 NOTE — Therapy (Signed)
[facility] [address] [place], [place], [address] Phone: [phone]   Fax:  [phone]  Physical Therapy Treatment  Patient Details  Name: Elizabeth [name] MRN: [id number] Date of Birth: 01/25/1931 Referring Provider: Wells Guiles Tat  Encounter Date: 09/11/2016      PT End of Session - 09/11/16 1417    Visit Number (P)  11   Number of Visits (P)  20   Date for PT Re-Evaluation (P)  09/18/16   Authorization Type (P)  UHC Medicare G codes;  KX at visit 15   PT Start Time (P)  1407   PT Stop Time (P)  1445   PT Time Calculation (min) (P)  38 min      Past Medical History:  Diagnosis Date  . Abnormal Pap smear of vagina 09/2006   Dr Nori Riis  . Hyperlipidemia   . Hypothyroidism   . Osteopenia    PMH fracture heel, wrist  . Pernicious anemia   . Post-polio syndrome    minor problems L leg  . Vitamin B12 deficiency     Past Surgical History:  Procedure Laterality Date  . BLADDER SUSPENSION  2000  . no colonoscopy     "I never felt I needed one "    There were no vitals filed for this visit.      Subjective Assessment - 09/11/16 1414    Subjective Reports no changes.  The right side of my neck hurts today.  Been playing Scrabble, Bingo.    Currently in Pain? Yes   Pain Score 6    Pain Location Neck   Pain Orientation Right   Pain Onset More than a month ago                         [facility] Adult PT Treatment/Exercise - 09/11/16 0001      Neck Exercises: Standing   Wall Push Ups 10 reps;  Standing with hands on wall with hip extension alternating 10x right/left   Other Standing Exercises back against wall shoulder extension isometrics, W lifts 10x     Neck Exercises: Seated   Postural Training W arm lifts with mirror feedback 10x   Other Seated Exercise thoracic extension over foam roll 10x   Other Seated Exercise with foam roll with red band UE lifts, External rotation     Knee/Hip  Exercises: Aerobic   Nustep Nu-Step L1 7 min     Manual Therapy   Manual therapy comments seated rotation mobs with movement 8x right and left   Soft tissue mobilization cervical paraspinals   Manual Traction gentle 5x 20 sec   Muscle Energy Technique right/left upper trap contract/relax 3x 5 sec holds                  PT Short Term Goals - 09/11/16 1555      PT SHORT TERM GOAL #1   Title The patient will demonstrate awareness of postural correction, ostepenia precautions with movement 06/26/16   Status Achieved     PT SHORT TERM GOAL #2   Title Neck pain with turning head, driving and bending over improved by 25%    Status Achieved     PT SHORT TERM GOAL #3   Title Cervical right rotation improved to 33 degrees needed for driving   Time 4   Period Weeks   Status On-going  PT Long Term Goals - 09/11/16 1556      PT LONG TERM GOAL #1   Title The patient will be independent in safe, self progression of HEP for further improvements in ROM, strength, posture and pain   07/24/16   Time 8   Period Weeks   Status On-going     PT LONG TERM GOAL #2   Title The patient will have improved thoracic extension and overall postural correction in standing with a height measurement of 57 3/4 inches   Time 8   Period Weeks   Status On-going     PT LONG TERM GOAL #3   Title Neck pain improvement with driving and home ADLs at 50%   Time 8   Period Weeks   Status On-going     PT LONG TERM GOAL #4   Title Cervical and periscapular muscle strength improved to 4/5 needed for postural correction for ADLs   Time 8   Period Weeks   Status On-going     PT LONG TERM GOAL #5   Title Right cervical rotation of 38 degrees needed for driving   Time 8   Period Weeks   Status On-going     PT LONG TERM GOAL #6   Title FOTO functional outcome score improved to 37% limitation indicating improved function with less pain   Time 8   Period Weeks   Status On-going                Plan - 09/11/16 1443    Clinical Impression Statement Patient reports improving pain and postural alignment.  She has a trigger point in right upper trap which refers pain to the right side of her neck.  Improved muscle length following manual therapy.  Good response with rotation mob with movement.   Treatment modified to avoid too much time in one position as patient reports her LEs start to hurt/ache with prolonged positioning.  Verbal cues to hold head in neutral needed in all positions.  Recommend 4-6 more visits to promote independence with HEP.   PT Next Visit Plan  Recert due next visit;  check height; cervical AROM;   postural strengthening in standing;  manual therapy as needed;  cervical ROM especially rotation;  steps ups;  thoracic extension      Patient will benefit from skilled therapeutic intervention in order to improve the following deficits and impairments:     Visit Diagnosis: Cervicalgia  Muscle weakness (generalized)  Abnormal posture     Problem List Patient Active Problem List   Diagnosis Date Noted  . Parkinson disease ([place]) 06/25/2016  . Restless leg syndrome 12/25/2015  . Arthritis 12/25/2015  . Abnormality of gait 05/24/2015  . Intention tremor 12/08/2013  . History of CVA (cerebrovascular accident) 07/20/2012  . IRRITABLE BOWEL SYNDROME 01/17/2009  . Cervicalgia 01/17/2009  . INCONTINENCE 09/07/2008  . ANEMIA, B12 DEFICIENCY 04/26/2008  . HYPERLIPIDEMIA 01/26/2008  . History of TIA (transient ischemic attack) 01/26/2008  . Hypothyroidism 01/04/2008  . OSTEOPENIA 01/04/2008   [name], PT 09/11/16 3:59 PM Phone: [phone] Fax: [phone] Alvera Singh 09/11/2016, 3:58 PM  [facility] [address] [place], [place], [address] Phone: [phone]   Fax:  [phone]  Name: Elizabeth [name] MRN: [id number] Date of Birth: Jan 31, 1931

## 2016-09-17 NOTE — Progress Notes (Signed)
Elizabeth [name] was seen today in the movement disorders clinic for neurologic consultation at the request of Binnie Rail, MD.  The consultation is for the evaluation of tremor and to r/o PD.  The tremor is in the L hand and is noted if she picks up something.  She is right hand dominant.  She has noted tremor for max of 2 years.  It really has not changed with time.  No family hx of tremor.  Denies leg tremor or head tremor.     Specific Symptoms:  Tremor: Yes.     Affected by caffeine:  No. (1 cup coffee per day)  Affected by alcohol:  No. (only drinks at christmas)  Affected by stress:  Yes.    Affected by fatigue:  Yes.    Spills soup if on spoon:  No. (involves non dominant hand)  Spills glass of liquid if full:  No. (involves non dominant hand)  Affects ADL's (tying shoes, brushing teeth, etc):  No.   By far her biggest c/o is inability to sit comfortably because of the need to move the legs when sitting.  "If I don't move them, I feel like I will scream."  It generally happens when she tries to watch TV.  She was told by her dermatologist that it was due to venous insufficiency.  If she gets up and moves around the sx's are better.  She can sit and play bridge and scrabble for hours without problem but cannot watch TV without getting up and moving around  Any other sx's:   Voice: no change Sleep: trouble getting asleep because legs are not comfortable; "part of it may be my nervous"  Vivid Dreams:  Yes.  , "I feel like my mother is in the room with me and she is deceased"  Acting out dreams:  No., but "I live by myself" Wet Pillows: No. Postural symptoms:  Yes.    Falls?  Yes.  ; went to ED on 12/13/14 after a fall between the commode and the wall and she hit her head.  She drove herself to the ED.  States that she had a similar fall in 2013. Bradykinesia symptoms: difficulty getting out of a chair Loss of smell:  No. Loss of taste:  No. Urinary Incontinence:  Yes.  , bladder  suspension 20 years ago but gotten worse again (on myrbetriq) Difficulty Swallowing:  No. Handwriting, micrographia: No. Trouble buttoning clothing: Yes.   Depression:  Yes.   (worse since retirement) Memory changes:  No. Hallucinations:  No.  visual distortions: No. N/V:  No. Lightheaded:  No.  Syncope: No. Diplopia:  No. Dyskinesia:  No.   09/13/15 update:  The patient returns today for follow-up.  Last visit, I started her on low-dose Mirapex, 0.125 mg for her restless leg. Pt states that the medication really helped and "I need it for the rest of my days."   She had some blood work and she was anemic with a hemoglobin of 11.  I had intended to have her iron stores/TIBC drawn but for some reason that was not done.  Neuroimaging has previously been performed.  It is available for my review today.  Had a CT brain in the ED in Jan 2016 after a fall.  There was an old infact in the R occipito-parietal region and L caudate region; there was small vessel disease and atrophy.  03/13/16 update:  The patient is following up today and has a history  of restless leg syndrome.  She is on Mirapex, 0.125 mg.  She was slightly anemic when I checked her hemoglobin last October (1.6) and her primary care physician rechecked this in February and her hemoglobin was 11.0.  Overall, this has been stable for quite some time.  Her iron stores and iron saturation were good in October.  With the medication, she has been sleeping well.  Her biggest c/o is neck pain x several months.  She saw Dr. Noemi Chapel and states that she had a cortisone injection but that was in the right shoulder.  Sometimes, the pain radiates in the neck.  X rays were done of the cervical spine but no other neuroimaging.  No PT for the neck.     04/24/16 update:  The patient is following up today.  Last visit, she looked more parkinsonian than previous visits, so I decided to start her on a trial of carbidopa/levodopa 25/100, one tablet 3 times per day.   Pt states this definitely helped.   She is also on Mirapex 0.125 mg at night.  She states that it is helping but is having some sx during the day and it drives her nuts; the last time she played bingo, she had to keep getting up.  She denies any falls since last visit.  No lightheadedness or near syncope.  No hallucinations.  She did have an MRI of the cervical spine that demonstrated a disc protrusion at C5-C6 level.  There was bilateral moderate neural foraminal stenosis at the C3/C4 level and C5/C6 level.  She was referred to physical therapy.  She has an appt upcoming.  States that she has had "neck popping" but no significant pain.  Asks me for a neck brace because she feels that she is humping over some and has trouble keeping neck up.    09/18/16 update:  The patient follows up today, on carbidopa/levodopa 25/100, one tablet 3 times per day.  She is also on pramipexole, 0.125 mg twice a day for restless leg syndrome.  She denies any cognitive change.  She denies compulsive behaviors.  She denies sleep attacks.  She denies falls.  She denies lightheadedness or near syncope.  No hallucinations.  He has been attending physical therapy for cervicalgia since our last visit.  It helped that and her balance and strength.  She states "I need more."  I have reviewed those records.    ALLERGIES:   Allergies  Allergen Reactions  . Atorvastatin     REACTION: Felt funny (only way patient could describe)  . Vesicare [Solifenacin]     constipation    CURRENT MEDICATIONS:  Outpatient Encounter Prescriptions as of 09/18/2016  Medication Sig  . aspirin 81 MG tablet Take 81 mg by mouth daily.    . Calcium Carbonate-Vitamin D (CALCIUM + D PO) Take 1 tablet by mouth daily. Reported on 12/25/2015  . carbidopa-levodopa (SINEMET IR) 25-100 MG tablet Take 1 tablet by mouth 3 (three) times daily.  . cholecalciferol (VITAMIN D) 1000 UNITS tablet Take 1,000 Units by mouth daily.    . Cyanocobalamin (VITAMIN B-12  IJ) Inject as directed.  Marland Kitchen levothyroxine (SYNTHROID, LEVOTHROID) 25 MCG tablet Take 1 tablet (25 mcg total) by mouth daily.  . pramipexole (MIRAPEX) 0.125 MG tablet Take 1 tablet (0.125 mg total) by mouth 2 (two) times daily.  . rosuvastatin (CRESTOR) 10 MG tablet TAKE (1) TABLET ON MONDAY, WEDNESDAY AND FRIDAY.  Marland Kitchen vitamin B-12 (CYANOCOBALAMIN) 1000 MCG tablet Take 1 tablet (1,000 mcg  total) by mouth daily.  . vitamin E 400 UNIT capsule Take 400 Units by mouth daily.    No facility-administered encounter medications on file as of 09/18/2016.     PAST MEDICAL HISTORY:   Past Medical History:  Diagnosis Date  . Abnormal Pap smear of vagina 09/2006   Dr Nori Riis  . Hyperlipidemia   . Hypothyroidism   . Osteopenia    PMH fracture heel, wrist  . Pernicious anemia   . Post-polio syndrome    minor problems L leg  . Vitamin B12 deficiency     PAST SURGICAL HISTORY:   Past Surgical History:  Procedure Laterality Date  . BLADDER SUSPENSION  2000  . no colonoscopy     "I never felt I needed one "    SOCIAL HISTORY:   Social History   Social History  . Marital status: Single    Spouse name: N/A  . Number of children: N/A  . Years of education: N/A   Occupational History  . Not on file.   Social History Main Topics  . Smoking status: Never Smoker  . Smokeless tobacco: Not on file  . Alcohol use 0.0 oz/week     Comment: wine very rarely (at Christmas)  . Drug use: No  . Sexual activity: Not on file   Other Topics Concern  . Not on file   Social History Narrative  . No narrative on file    FAMILY HISTORY:   Family Status  Relation Status  . Maternal Grandfather Deceased at age 57  . Mother Deceased   emphysema, HTN  . Father Deceased   alzheimer's  . Brother Deceased   drown    ROS:  A complete 10 system review of systems was obtained and was unremarkable apart from what is mentioned above.  PHYSICAL EXAMINATION:    VITALS:   There were no vitals filed for  this visit.  GEN:  The patient appears stated age and is in NAD. HEENT:  Normocephalic, atraumatic.  The mucous membranes are moist. The superficial temporal arteries are without ropiness or tenderness. CV:  RRR Lungs:  CTAB Neck/HEME:  There are no carotid bruits bilaterally.  Neurological examination:  Orientation: The patient is alert and oriented x3.  Cranial nerves: There is good facial symmetry. There is facial hypomimia. The visual fields are full to confrontational testing. The speech is fluent and clear but hypophonic. Soft palate rises symmetrically and there is no tongue deviation. Hearing is intact to conversational tone. Sensation: Sensation is intact to light touch throughout Motor: Strength is 5/5 in the bilateral upper and lower extremities.   Shoulder shrug is equal and symmetric.  There is no pronator drift.   Movement examination: Tone: There is normal tone in the bilateral upper extremities.  The tone in the lower extremities is  normal.  Abnormal movements:  She has a L hand rest tremor that is mild Coordination:  There is some difficulty with hand opening and closing bilaterally and finger taps more on the L. Gait and Station: The patient has minimal difficulty arising out of a deep-seated chair without the use of the hands.  She is short stepped.  She has L hand tremor with ambulation and slightly drags the L leg with ambulation (states had polio as a child)  LABS  Lab Results  Component Value Date   TSH 4.80 (H) 08/06/2016     Chemistry      Component Value Date/Time   NA 143 08/06/2016 1701  K 3.9 08/06/2016 1701   CL 108 08/06/2016 1701   CO2 29 08/06/2016 1701   BUN 19 08/06/2016 1701   CREATININE 0.80 08/06/2016 1701      Component Value Date/Time   CALCIUM 8.8 08/06/2016 1701   ALKPHOS 81 08/06/2016 1701   AST 24 08/06/2016 1701   ALT 15 08/06/2016 1701   BILITOT 1.2 08/06/2016 1701     Lab Results  Component Value Date   WBC 4.5  01/03/2016   HGB 11.0 (L) 01/03/2016   HCT 33.4 (L) 01/03/2016   MCV 85.6 01/03/2016   PLT 174.0 01/03/2016   Lab Results  Component Value Date   IRON 64 09/13/2015   FERRITIN 52.9 06/07/2015     Chemistry      Component Value Date/Time   NA 143 08/06/2016 1701   K 3.9 08/06/2016 1701   CL 108 08/06/2016 1701   CO2 29 08/06/2016 1701   BUN 19 08/06/2016 1701   CREATININE 0.80 08/06/2016 1701      Component Value Date/Time   CALCIUM 8.8 08/06/2016 1701   ALKPHOS 81 08/06/2016 1701   AST 24 08/06/2016 1701   ALT 15 08/06/2016 1701   BILITOT 1.2 08/06/2016 1701       ASSESSMENT/PLAN:  1.  Parkinsons disease  -continue carbidopa/levodopa 25/100 tid dosing.  Risks, benefits, side effects and alternative therapies were discussed.  The opportunity to ask questions was given and they were answered to the best of my ability.  The patient expressed understanding and willingness to follow the outlined treatment protocols.  -she is in PT now and wants to continue but suspect that insurance is going to d/c that soon.  Think she would do well with ACT.  -given information on the ACT program and ACT scholarship program  -met with our social worker today 2.  Restless leg syndrome  -Is much better with mirapex, 0.125 mg bid.  No evidence of cognitive change.    -wonder if anemia contributing.  Will recheck and recheck iron stores 3.  Degenerative changes in the cervical region  -has Attended physical therapy. 4.  Follow up is anticipated in the next 3 months, sooner should new issues arise.  Much greater than 50% of this visit was spent in counseling and coordinating care.  Total face to face time:  25 min

## 2016-09-18 ENCOUNTER — Encounter: Payer: Self-pay | Admitting: Neurology

## 2016-09-18 ENCOUNTER — Other Ambulatory Visit (INDEPENDENT_AMBULATORY_CARE_PROVIDER_SITE_OTHER): Payer: Medicare Other

## 2016-09-18 ENCOUNTER — Ambulatory Visit (INDEPENDENT_AMBULATORY_CARE_PROVIDER_SITE_OTHER): Payer: Medicare Other | Admitting: Neurology

## 2016-09-18 VITALS — BP 100/70 | HR 60 | Ht 60.0 in | Wt 95.2 lb

## 2016-09-18 DIAGNOSIS — G2581 Restless legs syndrome: Secondary | ICD-10-CM

## 2016-09-18 DIAGNOSIS — G20 Parkinson's disease: Secondary | ICD-10-CM

## 2016-09-18 DIAGNOSIS — D649 Anemia, unspecified: Secondary | ICD-10-CM

## 2016-09-18 LAB — CBC
HEMATOCRIT: 34.4 % — AB (ref 36.0–46.0)
HEMOGLOBIN: 11.6 g/dL — AB (ref 12.0–15.0)
MCHC: 33.8 g/dL (ref 30.0–36.0)
MCV: 84.6 fl (ref 78.0–100.0)
Platelets: 182 10*3/uL (ref 150.0–400.0)
RBC: 4.07 Mil/uL (ref 3.87–5.11)
RDW: 15.6 % — ABNORMAL HIGH (ref 11.5–15.5)
WBC: 4.6 10*3/uL (ref 4.0–10.5)

## 2016-09-18 LAB — IRON,TIBC AND FERRITIN PANEL
%SAT: 22 % (ref 11–50)
FERRITIN: 98 ng/mL (ref 20–288)
Iron: 61 ug/dL (ref 45–160)
TIBC: 281 ug/dL (ref 250–450)

## 2016-09-18 MED ORDER — PRAMIPEXOLE DIHYDROCHLORIDE 0.125 MG PO TABS: 0.1250 mg | ORAL_TABLET | Freq: Two times a day (BID) | ORAL | 1 refills | Status: DC

## 2016-09-18 MED ORDER — CARBIDOPA-LEVODOPA 25-100 MG PO TABS: 1.0000 | ORAL_TABLET | Freq: Three times a day (TID) | ORAL | 1 refills | Status: DC

## 2016-09-18 NOTE — Progress Notes (Signed)
Clinical Social Work Note  CSW received request from Dr. Carles Collet to meet with pt to provide information on scholarship program for local exercise programs. CSW met with pt briefly. CSW introduced self and explained role. CSW provided contact information for [employer] and encouraged pt to contact [place] to inquire about how organization may be able to assist pt with cost of ACT gym. CSW discussed with pt that the owner with ACT gym also has the ability to contact [place] about scholarships as it may be easiest to set up a time at [employer]  and the owner can assist with inquiring about scholarship. Pt expressed understanding and appreciative of information. Pt agreeable to notify CSW if any social work needs arise before next office visit.  Alison Murray, MSW, LCSW Clinical Social Worker [facility] [phone]

## 2016-09-19 ENCOUNTER — Telehealth: Payer: Self-pay | Admitting: Neurology

## 2016-09-19 NOTE — Telephone Encounter (Signed)
Patient made aware.

## 2016-09-19 NOTE — Telephone Encounter (Signed)
-----   Message from [place], DO sent at 09/19/2016 10:03 AM EDT ----- Let pt know that still a little anemic but better than before and iron stores are pretty good

## 2016-09-19 NOTE — Telephone Encounter (Signed)
Tried to call patient with no answer. Will try again later 

## 2016-09-25 ENCOUNTER — Ambulatory Visit: Payer: Medicare Other | Attending: Neurology | Admitting: Physical Therapy

## 2016-09-25 DIAGNOSIS — M6281 Muscle weakness (generalized): Secondary | ICD-10-CM

## 2016-09-25 DIAGNOSIS — M542 Cervicalgia: Secondary | ICD-10-CM | POA: Insufficient documentation

## 2016-09-25 DIAGNOSIS — R293 Abnormal posture: Secondary | ICD-10-CM | POA: Diagnosis present

## 2016-09-25 NOTE — Therapy (Addendum)
[facility] [address] [place], [place], [address] Phone: [phone]   Fax:  [phone]  Physical Therapy Treatment/Recertification /Discharge Summary  Patient Details  Name: Elizabeth [name] MRN: [id number] Date of Birth: 1931/03/23 Referring Provider: Wells Guiles Tat  Encounter Date: 09/25/2016      PT End of Session - 09/25/16 1443    Visit Number 12   Number of Visits 20   Date for PT Re-Evaluation 11/06/16   Authorization Type [employer] Medicare G codes;  KX at visit 15   PT Start Time 1400   PT Stop Time 1443   PT Time Calculation (min) 43 min   Activity Tolerance Patient tolerated treatment well      Past Medical History:  Diagnosis Date  . Abnormal Pap smear of vagina 09/2006   Dr Nori Riis  . Hyperlipidemia   . Hypothyroidism   . Osteopenia    PMH fracture heel, wrist  . Pernicious anemia   . Post-polio syndrome    minor problems L leg  . Vitamin B12 deficiency     Past Surgical History:  Procedure Laterality Date  . BLADDER SUSPENSION  2000  . no colonoscopy     "I never felt I needed one "    There were no vitals filed for this visit.      Subjective Assessment - 09/25/16 1358    Subjective I need you to work on my neck.  I went to see Dr. Carles Collet.  She recommended joining a gym after finishing PT.     Currently in Pain? Yes   Pain Score 6    Pain Location Neck   Aggravating Factors  turning head to drive   Pain Relieving Factors massage            [facility] PT Assessment - 09/25/16 0001      Observation/Other Assessments   Focus on Therapeutic Outcomes (FOTO)  46% limitation     Posture/Postural Control   Postural Limitations --  standing posture 57 3/4 inches   Posture Comments best sitting posture 5 degrees forward flexion      AROM   Cervical Flexion 35   Cervical Extension 63   Cervical - Right Side Bend 25   Cervical - Left Side Bend 25   Cervical - Right Rotation 35   Cervical -  Left Rotation 30     Strength   Cervical Flexion 3+/5   Cervical Extension 3+/5                     [facility] Adult PT Treatment/Exercise - 09/25/16 0001      Neck Exercises: Standing   Other Standing Exercises back against wall shoulder extension isometrics, W lifts, UE elevation 10x each with 1/2 foam roll and mirror feedback   Other Standing Exercises against wall with green ball cervical isometrics, rotation 10x each with mirror feedback     Knee/Hip Exercises: Aerobic   Nustep Nu-Step L1 8 min     Knee/Hip Exercises: Standing   Forward Step Up Right;Left;1 set;10 reps     Manual Therapy   Manual therapy comments seated rotation mobs with movement 8x right and left   Soft tissue mobilization cervical paraspinals   Manual Traction gentle 5x 20 sec   Muscle Energy Technique right/left upper trap contract/relax 3x 5 sec holds                  PT Short Term Goals - 09/25/16  1907      PT SHORT TERM GOAL #1   Title The patient will demonstrate awareness of postural correction, ostepenia precautions with movement 06/26/16   Status Achieved     PT SHORT TERM GOAL #2   Title Neck pain with turning head, driving and bending over improved by 25%    Status Achieved     PT SHORT TERM GOAL #3   Title Cervical right rotation improved to 33 degrees needed for driving   Time 4   Period Weeks   Status Partially Met           PT Long Term Goals - 09/25/16 1418      PT LONG TERM GOAL #1   Title The patient will be independent in safe, self progression of HEP for further improvements in ROM, strength, posture and pain   07/24/16   Time 8   Period Weeks   Status On-going     PT LONG TERM GOAL #2   Title The patient will have improved thoracic extension and overall postural correction in standing with a height measurement of 57 3/4 inches   Status Achieved     PT LONG TERM GOAL #3   Title Neck pain improvement with driving and home ADLs at 50%   Status  Achieved     PT LONG TERM GOAL #4   Title Cervical and periscapular muscle strength improved to 4/5 needed for postural correction for ADLs   Time 8   Period Weeks   Status On-going     PT LONG TERM GOAL #5   Title Right cervical rotation of 38 degrees needed for driving   Time 8   Period Weeks   Status On-going     PT LONG TERM GOAL #6   Title FOTO functional outcome score improved to 37% limitation indicating improved function with less pain   Time 8   Period Weeks   Status On-going               Plan - 09/25/16 1601    Clinical Impression Statement The patient has improved cervical AROM in majority of planes but is still limited in cervical rotation needed for driving.  She has improved thoracic extension and postural awareness with current height of 57 3/4 inches.  She continues to have muscular pain in cervical region which does respond well to manual therapy.  Progress has been slowed by multiple co-morbidities including post-polio syndrome,   osteoporosis, and Parkinsons.  She has initiated planning to continue with a gym program if she can find financial scholarship assist.  She is progressing with rehab goals and anticipate she will meet remaining goals in next 3 visits.     Rehab Potential Good   Clinical Impairments Affecting Rehab Potential osteopenia;  falls;  post-polio syndrome;  ? Parkinson's;  restless legs   PT Frequency 1x / week   PT Duration 6 weeks   PT Treatment/Interventions ADLs/Self Care Home Management;Cryotherapy;Electrical Stimulation;Moist Heat;Ultrasound;Traction;Therapeutic exercise;Therapeutic activities;Neuromuscular re-education;Patient/family education;Manual techniques;Dry needling;Taping   PT Next Visit Plan Continue 3 more visits to promote independence with HEP/gym program;  encourage cervical rotation. postural strengthening and thoracic extension;  manual therapy/soft tissue work     PHYSICAL THERAPY DISCHARGE SUMMARY  Visits from  Start of Care: 12  Current functional level related to goals / functional outcomes: Patient called on 10/21/16, stating she was "really busy with the holidays" and requested discharge from PT.  She plans to continue with a gym program (ACT).  Remaining deficits: As above   Education / Equipment: Comprehensive HEP Plan: Patient agrees to discharge.  Patient goals were partially met. Patient is being discharged due to the patient's request.  ?????        G code:  Walking, moving around   Goal CJ 20-40% limitation                Discharge 40-60% CK   Patient will benefit from skilled therapeutic intervention in order to improve the following deficits and impairments:  Decreased range of motion, Decreased strength, Increased muscle spasms, Postural dysfunction, Pain  Visit Diagnosis: Cervicalgia  Muscle weakness (generalized)  Abnormal posture     Problem List Patient Active Problem List   Diagnosis Date Noted  . Parkinson disease ([place]) 06/25/2016  . Restless leg syndrome 12/25/2015  . Arthritis 12/25/2015  . Abnormality of gait 05/24/2015  . Intention tremor 12/08/2013  . History of CVA (cerebrovascular accident) 07/20/2012  . IRRITABLE BOWEL SYNDROME 01/17/2009  . Cervicalgia 01/17/2009  . INCONTINENCE 09/07/2008  . ANEMIA, B12 DEFICIENCY 04/26/2008  . HYPERLIPIDEMIA 01/26/2008  . History of TIA (transient ischemic attack) 01/26/2008  . Hypothyroidism 01/04/2008  . OSTEOPENIA 01/04/2008   [name], PT 09/25/16 7:10 PM Phone: [phone] Fax: [phone]  Alvera Singh 09/25/2016, 7:10 PM  [facility] [address] [place], [place], [address] Phone: [phone]   Fax:  [phone]  Name: Elizabeth [name] MRN: [id number] Date of Birth: [birth year]

## 2016-10-09 ENCOUNTER — Encounter: Payer: Medicare Other | Admitting: Physical Therapy

## 2016-10-14 ENCOUNTER — Encounter: Payer: Medicare Other | Admitting: Physical Therapy

## 2016-10-23 ENCOUNTER — Encounter: Payer: Medicare Other | Admitting: Physical Therapy

## 2016-10-24 ENCOUNTER — Encounter: Payer: Medicare Other | Admitting: Physical Therapy

## 2016-10-29 ENCOUNTER — Other Ambulatory Visit: Payer: Self-pay | Admitting: Internal Medicine

## 2016-10-30 ENCOUNTER — Encounter: Payer: Medicare Other | Admitting: Physical Therapy

## 2016-11-06 ENCOUNTER — Ambulatory Visit: Payer: Medicare Other | Admitting: Physical Therapy

## 2016-11-13 ENCOUNTER — Encounter: Payer: Medicare Other | Admitting: Physical Therapy

## 2016-12-25 ENCOUNTER — Ambulatory Visit: Payer: Medicare Other | Admitting: Neurology

## 2017-01-06 NOTE — Progress Notes (Signed)
Elizabeth [name] was seen today in the movement disorders clinic for neurologic consultation at the request of Binnie Rail, MD.  The consultation is for the evaluation of tremor and to r/o PD.  The tremor is in the L hand and is noted if she picks up something.  She is right hand dominant.  She has noted tremor for max of 2 years.  It really has not changed with time.  No family hx of tremor.  Denies leg tremor or head tremor.     Specific Symptoms:  Tremor: Yes.     Affected by caffeine:  No. (1 cup coffee per day)  Affected by alcohol:  No. (only drinks at christmas)  Affected by stress:  Yes.    Affected by fatigue:  Yes.    Spills soup if on spoon:  No. (involves non dominant hand)  Spills glass of liquid if full:  No. (involves non dominant hand)  Affects ADL's (tying shoes, brushing teeth, etc):  No.   By far her biggest c/o is inability to sit comfortably because of the need to move the legs when sitting.  "If I don't move them, I feel like I will scream."  It generally happens when she tries to watch TV.  She was told by her dermatologist that it was due to venous insufficiency.  If she gets up and moves around the sx's are better.  She can sit and play bridge and scrabble for hours without problem but cannot watch TV without getting up and moving around  Any other sx's:   Voice: no change Sleep: trouble getting asleep because legs are not comfortable; "part of it may be my nervous"  Vivid Dreams:  Yes.  , "I feel like my mother is in the room with me and she is deceased"  Acting out dreams:  No., but "I live by myself" Wet Pillows: No. Postural symptoms:  Yes.    Falls?  Yes.  ; went to ED on 12/13/14 after a fall between the commode and the wall and she hit her head.  She drove herself to the ED.  States that she had a similar fall in 2013. Bradykinesia symptoms: difficulty getting out of a chair Loss of smell:  No. Loss of taste:  No. Urinary Incontinence:  Yes.  , bladder  suspension 20 years ago but gotten worse again (on myrbetriq) Difficulty Swallowing:  No. Handwriting, micrographia: No. Trouble buttoning clothing: Yes.   Depression:  Yes.   (worse since retirement) Memory changes:  No. Hallucinations:  No.  visual distortions: No. N/V:  No. Lightheaded:  No.  Syncope: No. Diplopia:  No. Dyskinesia:  No.   09/13/15 update:  The patient returns today for follow-up.  Last visit, I started her on low-dose Mirapex, 0.125 mg for her restless leg. Pt states that the medication really helped and "I need it for the rest of my days."   She had some blood work and she was anemic with a hemoglobin of 11.  I had intended to have her iron stores/TIBC drawn but for some reason that was not done.  Neuroimaging has previously been performed.  It is available for my review today.  Had a CT brain in the ED in Jan 2016 after a fall.  There was an old infact in the R occipito-parietal region and L caudate region; there was small vessel disease and atrophy.  03/13/16 update:  The patient is following up today and has a history  of restless leg syndrome.  She is on Mirapex, 0.125 mg.  She was slightly anemic when I checked her hemoglobin last October (1.6) and her primary care physician rechecked this in February and her hemoglobin was 11.0.  Overall, this has been stable for quite some time.  Her iron stores and iron saturation were good in October.  With the medication, she has been sleeping well.  Her biggest c/o is neck pain x several months.  She saw Dr. Noemi Chapel and states that she had a cortisone injection but that was in the right shoulder.  Sometimes, the pain radiates in the neck.  X rays were done of the cervical spine but no other neuroimaging.  No PT for the neck.     04/24/16 update:  The patient is following up today.  Last visit, she looked more parkinsonian than previous visits, so I decided to start her on a trial of carbidopa/levodopa 25/100, one tablet 3 times per day.   Pt states this definitely helped.   She is also on Mirapex 0.125 mg at night.  She states that it is helping but is having some sx during the day and it drives her nuts; the last time she played bingo, she had to keep getting up.  She denies any falls since last visit.  No lightheadedness or near syncope.  No hallucinations.  She did have an MRI of the cervical spine that demonstrated a disc protrusion at C5-C6 level.  There was bilateral moderate neural foraminal stenosis at the C3/C4 level and C5/C6 level.  She was referred to physical therapy.  She has an appt upcoming.  States that she has had "neck popping" but no significant pain.  Asks me for a neck brace because she feels that she is humping over some and has trouble keeping neck up.    09/18/16 update:  The patient follows up today, on carbidopa/levodopa 25/100, one tablet 3 times per day.  She is also on pramipexole, 0.125 mg twice a day for restless leg syndrome.  She denies any cognitive change.  She denies compulsive behaviors.  She denies sleep attacks.  She denies falls.  She denies lightheadedness or near syncope.  No hallucinations.  He has been attending physical therapy for cervicalgia since our last visit.  It helped that and her balance and strength.  She states "I need more."  I have reviewed those records.  01/08/17 update:   Patient follows up today.  The patient is on carbidopa/levodopa, one tablet 3 times per day.  She is having trouble swallowing this and "I about gag."  Asks me if there is something else she can try.   She is also on pramipexole 0.125 mg twice per day for restless leg.  Overall, the patient states that she has been doing well since our last visit.  She denies lightheadedness or near syncope.  No hallucinations.  No cognitive change.  No falls.    ALLERGIES:   Allergies  Allergen Reactions  . Atorvastatin     REACTION: Felt funny (only way patient could describe)  . Vesicare [Solifenacin]     constipation     CURRENT MEDICATIONS:  Outpatient Encounter Prescriptions as of 01/08/2017  Medication Sig  . aspirin 81 MG tablet Take 81 mg by mouth daily.    . Calcium Carbonate-Vitamin D (CALCIUM + D PO) Take 1 tablet by mouth daily. Reported on 12/25/2015  . carbidopa-levodopa (SINEMET IR) 25-100 MG tablet Take 1 tablet by mouth 3 (three) times  daily.  . cholecalciferol (VITAMIN D) 1000 UNITS tablet Take 1,000 Units by mouth daily.    . Cyanocobalamin (VITAMIN B-12 IJ) Inject as directed.  Marland Kitchen levothyroxine (SYNTHROID, LEVOTHROID) 25 MCG tablet TAKE 1 TABLET DAILY.  Marland Kitchen pramipexole (MIRAPEX) 0.125 MG tablet Take 1 tablet (0.125 mg total) by mouth 2 (two) times daily.  . rosuvastatin (CRESTOR) 10 MG tablet TAKE (1) TABLET ON MONDAY, WEDNESDAY AND FRIDAY.  Marland Kitchen tolterodine (DETROL LA) 4 MG 24 hr capsule   . vitamin B-12 (CYANOCOBALAMIN) 1000 MCG tablet Take 1 tablet (1,000 mcg total) by mouth daily.  . vitamin E 400 UNIT capsule Take 400 Units by mouth daily.    No facility-administered encounter medications on file as of 01/08/2017.     PAST MEDICAL HISTORY:   Past Medical History:  Diagnosis Date  . Abnormal Pap smear of vagina 09/2006   Dr Nori Riis  . Hyperlipidemia   . Hypothyroidism   . Osteopenia    PMH fracture heel, wrist  . Pernicious anemia   . Post-polio syndrome    minor problems L leg  . Vitamin B12 deficiency     PAST SURGICAL HISTORY:   Past Surgical History:  Procedure Laterality Date  . BLADDER SUSPENSION  2000  . no colonoscopy     "I never felt I needed one "    SOCIAL HISTORY:   Social History   Social History  . Marital status: Single    Spouse name: N/A  . Number of children: N/A  . Years of education: N/A   Occupational History  . Not on file.   Social History Main Topics  . Smoking status: Never Smoker  . Smokeless tobacco: Never Used  . Alcohol use 0.0 oz/week     Comment: wine very rarely (at Christmas)  . Drug use: No  . Sexual activity: Not on file    Other Topics Concern  . Not on file   Social History Narrative  . No narrative on file    FAMILY HISTORY:   Family Status  Relation Status  . Maternal Grandfather Deceased at age 69  . Mother Deceased   emphysema, HTN  . Father Deceased   alzheimer's  . Brother Deceased   drown  . Maternal Grandmother   . Neg Hx     ROS:  A complete 10 system review of systems was obtained and was unremarkable apart from what is mentioned above.  PHYSICAL EXAMINATION:    VITALS:   There were no vitals filed for this visit.  GEN:  The patient appears stated age and is in NAD. HEENT:  Normocephalic, atraumatic.  The mucous membranes are moist. The superficial temporal arteries are without ropiness or tenderness. CV:  RRR Lungs:  CTAB Neck/HEME:  There are no carotid bruits bilaterally.  Neurological examination:  Orientation: The patient is alert and oriented x3.  Cranial nerves: There is good facial symmetry. There is facial hypomimia. The visual fields are full to confrontational testing. The speech is fluent and clear but hypophonic. Soft palate rises symmetrically and there is no tongue deviation. Hearing is intact to conversational tone. Sensation: Sensation is intact to light touch throughout Motor: Strength is 5/5 in the bilateral upper and lower extremities.   Shoulder shrug is equal and symmetric.  There is no pronator drift.   Movement examination: Tone: There is normal tone in the bilateral upper extremities.  The tone in the lower extremities is  normal.  Abnormal movements:  She has a L hand  rest tremor that is mild Coordination:  There is some difficulty with hand opening and closing bilaterally and finger taps more on the L. Gait and Station: The patient has minimal difficulty arising out of a deep-seated chair without the use of the hands.  She is short stepped.  No left hand tremor today and slightly drags the L leg with ambulation (states had polio as a  child)  LABS  Lab Results  Component Value Date   TSH 4.80 (H) 08/06/2016     Chemistry      Component Value Date/Time   NA 143 08/06/2016 1701   K 3.9 08/06/2016 1701   CL 108 08/06/2016 1701   CO2 29 08/06/2016 1701   BUN 19 08/06/2016 1701   CREATININE 0.80 08/06/2016 1701      Component Value Date/Time   CALCIUM 8.8 08/06/2016 1701   ALKPHOS 81 08/06/2016 1701   AST 24 08/06/2016 1701   ALT 15 08/06/2016 1701   BILITOT 1.2 08/06/2016 1701     Lab Results  Component Value Date   WBC 4.6 09/18/2016   HGB 11.6 (L) 09/18/2016   HCT 34.4 (L) 09/18/2016   MCV 84.6 09/18/2016   PLT 182.0 09/18/2016   Lab Results  Component Value Date   IRON 61 09/18/2016   TIBC 281 09/18/2016   FERRITIN 98 09/18/2016     Chemistry      Component Value Date/Time   NA 143 08/06/2016 1701   K 3.9 08/06/2016 1701   CL 108 08/06/2016 1701   CO2 29 08/06/2016 1701   BUN 19 08/06/2016 1701   CREATININE 0.80 08/06/2016 1701      Component Value Date/Time   CALCIUM 8.8 08/06/2016 1701   ALKPHOS 81 08/06/2016 1701   AST 24 08/06/2016 1701   ALT 15 08/06/2016 1701   BILITOT 1.2 08/06/2016 1701       ASSESSMENT/PLAN:  1.  Parkinsons disease  -having trouble swallowing the carbidopa/levodopa 25/100 tid so going to try to switch to rytary 145 mg tid.  May need to increase that dose given poor bioavailability but she is to let me know how she feels once she trials it.  Risks, benefits, side effects and alternative therapies were discussed.  The opportunity to ask questions was given and they were answered to the best of my ability.  The patient expressed understanding and willingness to follow the outlined treatment protocols. 2.  Restless leg syndrome  -Is much better with mirapex, 0.125 mg bid.  No evidence of cognitive change.    -wonder if anemia contributing.  Will recheck and recheck iron stores 3.  Degenerative changes in the cervical region  -has Attended physical  therapy. 4.  Follow up is anticipated in the next 3 months, sooner should new issues arise.  Much greater than 50% of this visit was spent in counseling and coordinating care.  Total face to face time:  25 min

## 2017-01-08 ENCOUNTER — Ambulatory Visit (INDEPENDENT_AMBULATORY_CARE_PROVIDER_SITE_OTHER): Payer: Medicare Other | Admitting: Neurology

## 2017-01-08 ENCOUNTER — Encounter: Payer: Self-pay | Admitting: Neurology

## 2017-01-08 VITALS — BP 118/78 | HR 88 | Ht 60.0 in | Wt 94.0 lb

## 2017-01-08 DIAGNOSIS — Z8673 Personal history of transient ischemic attack (TIA), and cerebral infarction without residual deficits: Secondary | ICD-10-CM | POA: Diagnosis not present

## 2017-01-08 DIAGNOSIS — G2581 Restless legs syndrome: Secondary | ICD-10-CM

## 2017-01-08 DIAGNOSIS — M1991 Primary osteoarthritis, unspecified site: Secondary | ICD-10-CM | POA: Diagnosis not present

## 2017-01-08 DIAGNOSIS — G20 Parkinson's disease: Secondary | ICD-10-CM | POA: Diagnosis not present

## 2017-01-08 DIAGNOSIS — N390 Urinary tract infection, site not specified: Secondary | ICD-10-CM | POA: Diagnosis not present

## 2017-01-08 DIAGNOSIS — D181 Lymphangioma, any site: Secondary | ICD-10-CM | POA: Diagnosis not present

## 2017-01-08 DIAGNOSIS — M542 Cervicalgia: Secondary | ICD-10-CM | POA: Diagnosis not present

## 2017-01-08 DIAGNOSIS — D510 Vitamin B12 deficiency anemia due to intrinsic factor deficiency: Secondary | ICD-10-CM | POA: Diagnosis not present

## 2017-01-08 DIAGNOSIS — Z9181 History of falling: Secondary | ICD-10-CM | POA: Diagnosis not present

## 2017-01-08 MED ORDER — CARBIDOPA-LEVODOPA ER 36.25-145 MG PO CPCR: 1.0000 | ORAL_CAPSULE | Freq: Three times a day (TID) | ORAL | 0 refills | Status: DC

## 2017-01-08 NOTE — Patient Instructions (Signed)
Start Rytary one tablet three times daily. Call us in two weeks to let us know how you are doing.

## 2017-02-04 ENCOUNTER — Ambulatory Visit: Payer: Medicare Other | Admitting: Internal Medicine

## 2017-02-04 ENCOUNTER — Encounter: Payer: Medicare Other | Admitting: Internal Medicine

## 2017-03-04 ENCOUNTER — Other Ambulatory Visit (INDEPENDENT_AMBULATORY_CARE_PROVIDER_SITE_OTHER): Payer: Medicare Other

## 2017-03-04 ENCOUNTER — Ambulatory Visit (INDEPENDENT_AMBULATORY_CARE_PROVIDER_SITE_OTHER): Payer: Medicare Other | Admitting: Internal Medicine

## 2017-03-04 ENCOUNTER — Encounter: Payer: Self-pay | Admitting: Internal Medicine

## 2017-03-04 VITALS — BP 120/70 | HR 87 | Temp 97.7°F | Ht 60.0 in | Wt 93.1 lb

## 2017-03-04 DIAGNOSIS — E782 Mixed hyperlipidemia: Secondary | ICD-10-CM

## 2017-03-04 DIAGNOSIS — R1310 Dysphagia, unspecified: Secondary | ICD-10-CM

## 2017-03-04 DIAGNOSIS — R32 Unspecified urinary incontinence: Secondary | ICD-10-CM | POA: Diagnosis not present

## 2017-03-04 DIAGNOSIS — E039 Hypothyroidism, unspecified: Secondary | ICD-10-CM | POA: Diagnosis not present

## 2017-03-04 DIAGNOSIS — D518 Other vitamin B12 deficiency anemias: Secondary | ICD-10-CM | POA: Diagnosis not present

## 2017-03-04 DIAGNOSIS — M8588 Other specified disorders of bone density and structure, other site: Secondary | ICD-10-CM | POA: Diagnosis not present

## 2017-03-04 DIAGNOSIS — G2581 Restless legs syndrome: Secondary | ICD-10-CM | POA: Diagnosis not present

## 2017-03-04 DIAGNOSIS — Z23 Encounter for immunization: Secondary | ICD-10-CM | POA: Diagnosis not present

## 2017-03-04 DIAGNOSIS — Z0000 Encounter for general adult medical examination without abnormal findings: Secondary | ICD-10-CM

## 2017-03-04 DIAGNOSIS — G20 Parkinson's disease: Secondary | ICD-10-CM

## 2017-03-04 HISTORY — DX: Dysphagia, unspecified: R13.10

## 2017-03-04 LAB — COMPREHENSIVE METABOLIC PANEL
ALT: 5 U/L (ref 0–35)
AST: 25 U/L (ref 0–37)
Albumin: 4.2 g/dL (ref 3.5–5.2)
Alkaline Phosphatase: 80 U/L (ref 39–117)
BILIRUBIN TOTAL: 0.9 mg/dL (ref 0.2–1.2)
BUN: 21 mg/dL (ref 6–23)
CALCIUM: 9.4 mg/dL (ref 8.4–10.5)
CHLORIDE: 106 meq/L (ref 96–112)
CO2: 26 meq/L (ref 19–32)
CREATININE: 0.79 mg/dL (ref 0.40–1.20)
GFR: 73.68 mL/min (ref >60.00)
GLUCOSE: 94 mg/dL (ref 70–99)
Potassium: 3.9 mEq/L (ref 3.5–5.1)
SODIUM: 140 meq/L (ref 135–145)
Total Protein: 7 g/dL (ref 6.0–8.3)

## 2017-03-04 LAB — CBC WITH DIFFERENTIAL/PLATELET
BASOS ABS: 0 10*3/uL (ref 0.0–0.1)
Basophils Relative: 0.4 % (ref 0.0–3.0)
Eosinophils Absolute: 0.3 10*3/uL (ref 0.0–0.7)
Eosinophils Relative: 5.8 % — ABNORMAL HIGH (ref 0.0–5.0)
HEMATOCRIT: 36.2 % (ref 36.0–46.0)
Hemoglobin: 12.1 g/dL (ref 12.0–15.0)
LYMPHS PCT: 36.9 % (ref 12.0–46.0)
Lymphs Abs: 1.6 10*3/uL (ref 0.7–4.0)
MCHC: 33.5 g/dL (ref 30.0–36.0)
MCV: 85.4 fl (ref 78.0–100.0)
MONOS PCT: 8.8 % (ref 3.0–12.0)
Monocytes Absolute: 0.4 10*3/uL (ref 0.1–1.0)
Neutro Abs: 2.1 10*3/uL (ref 1.4–7.7)
Neutrophils Relative %: 48.1 % (ref 43.0–77.0)
Platelets: 204 10*3/uL (ref 150.0–400.0)
RBC: 4.24 Mil/uL (ref 3.87–5.11)
RDW: 15.3 % (ref 11.5–15.5)
WBC: 4.4 10*3/uL (ref 4.0–10.5)

## 2017-03-04 LAB — VITAMIN B12: Vitamin B-12: 866 pg/mL (ref 211–911)

## 2017-03-04 LAB — TSH: TSH: 3.4 u[IU]/mL (ref 0.35–4.50)

## 2017-03-04 NOTE — Assessment & Plan Note (Signed)
Taking mirapex Prescribed by Dr Carles Collet

## 2017-03-04 NOTE — Assessment & Plan Note (Signed)
Check tsh  Titrate med dose if needed  

## 2017-03-04 NOTE — Assessment & Plan Note (Addendum)
lipid panel well controlled Continue daily statin Regular exercise and healthy diet encouraged  

## 2017-03-04 NOTE — Assessment & Plan Note (Signed)
Check B12 

## 2017-03-04 NOTE — Assessment & Plan Note (Signed)
Following with urology Taking Detrol

## 2017-03-04 NOTE — Progress Notes (Signed)
Pre visit review using our clinic review tool, if applicable. No additional management support is needed unless otherwise documented below in the visit note. 

## 2017-03-04 NOTE — Patient Instructions (Addendum)
Elizabeth [name] , Thank you for taking time to come for your Medicare Wellness Visit. I appreciate your ongoing commitment to your health goals. Please review the following plan we discussed and let me know if I can assist you in the future.   These are the goals we discussed: Goals    None      This is a list of the screening recommended for you and due dates:  Health Maintenance  Topic Date Due  . Tetanus Vaccine  02/03/1950  . DEXA scan (bone density measurement)  05/03/2016  . Pneumonia vaccines (1 of 2 - PCV13) 01/11/2019*  . Flu Shot  06/24/2017  *Topic was postponed. The date shown is not the original due date.   Test(s) ordered today. Your results will be released to [place] (or called to you) after review, usually within 72hours after test completion. If any changes need to be made, you will be notified at that same time.  All other Health Maintenance issues reviewed.   All recommended immunizations and age-appropriate screenings are up-to-date or discussed.  Tetanus immunization administered today. An EKG was done today  Medications reviewed and updated.   No changes recommended at this time.  Your prescription(s) have been submitted to your pharmacy. Please take as directed and contact our office if you believe you are having problem(s) with the medication(s).   Please followup in 6 months   Health Maintenance, Female Adopting a healthy lifestyle and getting preventive care can go a long way to promote health and wellness. Talk with your health care provider about what schedule of regular examinations is right for you. This is a good chance for you to check in with your provider about disease prevention and staying healthy. In between checkups, there are plenty of things you can do on your own. Experts have done a lot of research about which lifestyle changes and preventive measures are most likely to keep you healthy. Ask your health care provider for more information. Weight  and diet Eat a healthy diet  Be sure to include plenty of vegetables, fruits, low-fat dairy products, and lean protein.  Do not eat a lot of foods high in solid fats, added sugars, or salt.  Get regular exercise. This is one of the most important things you can do for your health.  Most adults should exercise for at least 150 minutes each week. The exercise should increase your heart rate and make you sweat (moderate-intensity exercise).  Most adults should also do strengthening exercises at least twice a week. This is in addition to the moderate-intensity exercise. Maintain a healthy weight  Body mass index (BMI) is a measurement that can be used to identify possible weight problems. It estimates body fat based on height and weight. Your health care provider can help determine your BMI and help you achieve or maintain a healthy weight.  For females 77 years of age and older:  A BMI below 18.5 is considered underweight.  A BMI of 18.5 to 24.9 is normal.  A BMI of 25 to 29.9 is considered overweight.  A BMI of 30 and above is considered obese. Watch levels of cholesterol and blood lipids  You should start having your blood tested for lipids and cholesterol at 81 years of age, then have this test every 5 years.  You may need to have your cholesterol levels checked more often if:  Your lipid or cholesterol levels are high.  You are older than 81 years of age.  You are at high risk for heart disease. Cancer screening Lung Cancer  Lung cancer screening is recommended for adults 72-2 years old who are at high risk for lung cancer because of a history of smoking.  A yearly low-dose CT scan of the lungs is recommended for people who:  Currently smoke.  Have quit within the past 15 years.  Have at least a 30-pack-year history of smoking. A pack year is smoking an average of one pack of cigarettes a day for 1 year.  Yearly screening should continue until it has been 15 years  since you quit.  Yearly screening should stop if you develop a health problem that would prevent you from having lung cancer treatment. Breast Cancer  Practice breast self-awareness. This means understanding how your breasts normally appear and feel.  It also means doing regular breast self-exams. Let your health care provider know about any changes, no matter how small.  If you are in your 20s or 30s, you should have a clinical breast exam (CBE) by a health care provider every 1-3 years as part of a regular health exam.  If you are 69 or older, have a CBE every year. Also consider having a breast X-ray (mammogram) every year.  If you have a family history of breast cancer, talk to your health care provider about genetic screening.  If you are at high risk for breast cancer, talk to your health care provider about having an MRI and a mammogram every year.  Breast cancer gene (BRCA) assessment is recommended for women who have family members with BRCA-related cancers. BRCA-related cancers include:  Breast.  Ovarian.  Tubal.  Peritoneal cancers.  Results of the assessment will determine the need for genetic counseling and BRCA1 and BRCA2 testing. Cervical Cancer  Your health care provider may recommend that you be screened regularly for cancer of the pelvic organs (ovaries, uterus, and vagina). This screening involves a pelvic examination, including checking for microscopic changes to the surface of your cervix (Pap test). You may be encouraged to have this screening done every 3 years, beginning at age 41.  For women ages 33-65, health care providers may recommend pelvic exams and Pap testing every 3 years, or they may recommend the Pap and pelvic exam, combined with testing for human papilloma virus (HPV), every 5 years. Some types of HPV increase your risk of cervical cancer. Testing for HPV may also be done on women of any age with unclear Pap test results.  Other health care  providers may not recommend any screening for nonpregnant women who are considered low risk for pelvic cancer and who do not have symptoms. Ask your health care provider if a screening pelvic exam is right for you.  If you have had past treatment for cervical cancer or a condition that could lead to cancer, you need Pap tests and screening for cancer for at least 20 years after your treatment. If Pap tests have been discontinued, your risk factors (such as having a new sexual partner) need to be reassessed to determine if screening should resume. Some women have medical problems that increase the chance of getting cervical cancer. In these cases, your health care provider may recommend more frequent screening and Pap tests. Colorectal Cancer  This type of cancer can be detected and often prevented.  Routine colorectal cancer screening usually begins at 81 years of age and continues through 81 years of age.  Your health care provider may recommend screening at an earlier  age if you have risk factors for colon cancer.  Your health care provider may also recommend using home test kits to check for hidden blood in the stool.  A small camera at the end of a tube can be used to examine your colon directly (sigmoidoscopy or colonoscopy). This is done to check for the earliest forms of colorectal cancer.  Routine screening usually begins at age 66.  Direct examination of the colon should be repeated every 5-10 years through 81 years of age. However, you may need to be screened more often if early forms of precancerous polyps or small growths are found. Skin Cancer  Check your skin from head to toe regularly.  Tell your health care provider about any new moles or changes in moles, especially if there is a change in a mole's shape or color.  Also tell your health care provider if you have a mole that is larger than the size of a pencil eraser.  Always use sunscreen. Apply sunscreen liberally and  repeatedly throughout the day.  Protect yourself by wearing long sleeves, pants, a wide-brimmed hat, and sunglasses whenever you are outside. Heart disease, diabetes, and high blood pressure  High blood pressure causes heart disease and increases the risk of stroke. High blood pressure is more likely to develop in:  People who have blood pressure in the high end of the normal range (130-139/85-89 mm Hg).  People who are overweight or obese.  People who are African American.  If you are 74-36 years of age, have your blood pressure checked every 3-5 years. If you are 18 years of age or older, have your blood pressure checked every year. You should have your blood pressure measured twice-once when you are at a hospital or clinic, and once when you are not at a hospital or clinic. Record the average of the two measurements. To check your blood pressure when you are not at a hospital or clinic, you can use:  An automated blood pressure machine at a pharmacy.  A home blood pressure monitor.  If you are between 68 years and 40 years old, ask your health care provider if you should take aspirin to prevent strokes.  Have regular diabetes screenings. This involves taking a blood sample to check your fasting blood sugar level.  If you are at a normal weight and have a low risk for diabetes, have this test once every three years after 81 years of age.  If you are overweight and have a high risk for diabetes, consider being tested at a younger age or more often. Preventing infection Hepatitis B  If you have a higher risk for hepatitis B, you should be screened for this virus. You are considered at high risk for hepatitis B if:  You were born in a country where hepatitis B is common. Ask your health care provider which countries are considered high risk.  Your parents were born in a high-risk country, and you have not been immunized against hepatitis B (hepatitis B vaccine).  You have HIV or  AIDS.  You use needles to inject street drugs.  You live with someone who has hepatitis B.  You have had sex with someone who has hepatitis B.  You get hemodialysis treatment.  You take certain medicines for conditions, including cancer, organ transplantation, and autoimmune conditions. Hepatitis C  Blood testing is recommended for:  Everyone born from [birth year] through 1963.  Anyone with known risk factors for hepatitis C. Sexually transmitted infections (  STIs)  You should be screened for sexually transmitted infections (STIs) including gonorrhea and chlamydia if:  You are sexually active and are younger than 81 years of age.  You are older than 81 years of age and your health care provider tells you that you are at risk for this type of infection.  Your sexual activity has changed since you were last screened and you are at an increased risk for chlamydia or gonorrhea. Ask your health care provider if you are at risk.  If you do not have HIV, but are at risk, it may be recommended that you take a prescription medicine daily to prevent HIV infection. This is called pre-exposure prophylaxis (PrEP). You are considered at risk if:  You are sexually active and do not regularly use condoms or know the HIV status of your partner(s).  You take drugs by injection.  You are sexually active with a partner who has HIV. Talk with your health care provider about whether you are at high risk of being infected with HIV. If you choose to begin PrEP, you should first be tested for HIV. You should then be tested every 3 months for as long as you are taking PrEP. Pregnancy  If you are premenopausal and you may become pregnant, ask your health care provider about preconception counseling.  If you may become pregnant, take 400 to 800 micrograms (mcg) of folic acid every day.  If you want to prevent pregnancy, talk to your health care provider about birth control (contraception). Osteoporosis and  menopause  Osteoporosis is a disease in which the bones lose minerals and strength with aging. This can result in serious bone fractures. Your risk for osteoporosis can be identified using a bone density scan.  If you are 68 years of age or older, or if you are at risk for osteoporosis and fractures, ask your health care provider if you should be screened.  Ask your health care provider whether you should take a calcium or vitamin D supplement to lower your risk for osteoporosis.  Menopause may have certain physical symptoms and risks.  Hormone replacement therapy may reduce some of these symptoms and risks. Talk to your health care provider about whether hormone replacement therapy is right for you. Follow these instructions at home:  Schedule regular health, dental, and eye exams.  Stay current with your immunizations.  Do not use any tobacco products including cigarettes, chewing tobacco, or electronic cigarettes.  If you are pregnant, do not drink alcohol.  If you are breastfeeding, limit how much and how often you drink alcohol.  Limit alcohol intake to no more than 1 drink per day for nonpregnant women. One drink equals 12 ounces of beer, 5 ounces of wine, or 1 ounces of hard liquor.  Do not use street drugs.  Do not share needles.  Ask your health care provider for help if you need support or information about quitting drugs.  Tell your health care provider if you often feel depressed.  Tell your health care provider if you have ever been abused or do not feel safe at home. This information is not intended to replace advice given to you by your health care provider. Make sure you discuss any questions you have with your health care provider. Document Released: 05/26/2011 Document Revised: 04/17/2016 Document Reviewed: 08/14/2015 Elsevier Interactive Patient Education  2017 [employer].

## 2017-03-04 NOTE — Assessment & Plan Note (Signed)
Done by Concha Norway -Dr Nori Riis Will defer to Dr Nori Riis Taking calcium / vit D exercising

## 2017-03-04 NOTE — Assessment & Plan Note (Signed)
Feels like her esophagus is wider than usual when she swallows liquids - intermittent, not daily and does not occur with food No pain or difficulty swallowing Not getting worse Discussed swallowing evaluation and she deferred  - she will just monitor for now

## 2017-03-04 NOTE — Progress Notes (Signed)
Subjective:    Patient ID: Elizabeth [name], female    DOB: 10-25-1931, 83 y.o.   MRN: [id number]  HPI  Here for medicare wellness exam and annual physical exam.   I have personally reviewed and have noted 1.The patient's medical and social history 2.Their use of alcohol, tobacco or illicit drugs 3.Their current medications and supplements 4.The patient's functional ability including ADL's, fall risks, home safety risks and hearing or visual impairment. 5.Diet and physical activities 6.Evidence for depression or mood disorders 7.Care team reviewed - Gyn - Dr Nori Riis, eye - Dr Gershon Crane, Neuro - Dr Tat    Sometimes when she drinks something if feels the esophagus is wider than it should be. She denies pain with swallowing.  It is intermittent.  She denies the sensation with food.  She does not think this is getting worse.    Her left ear feels clogged.  The left ear canal feels smaller.   She exercises 1-2 times a day.  She exercises at home - different exercises.    Are there smokers in your home (other than you)? No  Risk Factors Exercise:  Regular - daily at home Dietary issues discussed: well  balanced, lots of fruits and veges, cooks herself  Cardiac risk factors: advanced age   Depression Screen  Have you felt down, depressed or hopeless? No  Have you felt little interest or pleasure in doing things?  No  Activities of Daily Living In your present state of health, do you have any difficulty performing the following activities?:  Driving? Yes, can not drive at night Managing money?  No Feeding yourself? No Getting from bed to chair? No Climbing a flight of stairs? No Preparing food and eating?: No Bathing or showering? No Getting dressed: No Getting to/using the toilet? No Moving around from place to place: No In the past year have you fallen or had a near fall?: No   Are you sexually active?   No  Hearing Difficulties:  Do you often ask people to speak up or repeat themselves? No Do you experience ringing or noises in your ears? No Do you have difficulty understanding soft or whispered voices? No Vision:              Any change in vision:  No              Up to date with eye exam:   Yes  Memory:  Do you feel that you have a problem with memory? No  Do you often misplace items? No  Do you feel safe at home?  Yes  Cognitive Testing  Alert, Orientated? Yes  Normal Appearance? Yes  Recall of three objects?  Yes  Can perform simple calculations? Yes  Displays appropriate judgment? Yes  Can read the correct time from a watch face? Yes   Advanced Directives have been discussed with the patient? Yes   Medications and allergies reviewed with patient and updated if appropriate.  Patient Active Problem List   Diagnosis Date Noted  . Parkinson disease ([place]) 06/25/2016  . Restless leg syndrome 12/25/2015  . Arthritis 12/25/2015  . Abnormality of gait 05/24/2015  . Intention tremor 12/08/2013  . History of CVA (cerebrovascular accident) 07/20/2012  . IRRITABLE BOWEL SYNDROME 01/17/2009  . Cervicalgia 01/17/2009  . Urinary incontinence 09/07/2008  . ANEMIA, B12 DEFICIENCY 04/26/2008  . HYPERLIPIDEMIA 01/26/2008  . History of TIA (transient ischemic attack) 01/26/2008  . Hypothyroidism 01/04/2008  . Osteopenia 01/04/2008  Current Outpatient Prescriptions on File Prior to Visit  Medication Sig Dispense Refill  . aspirin 81 MG tablet Take 81 mg by mouth daily.      . Calcium Carbonate-Vitamin D (CALCIUM + D PO) Take 1 tablet by mouth daily. Reported on 12/25/2015    . Carbidopa-Levodopa ER (RYTARY) 36.25-145 MG CPCR Take 1 tablet by mouth 3 (three) times daily. 100 capsule 0  . cholecalciferol (VITAMIN D) 1000 UNITS tablet Take 1,000 Units by mouth daily.      Marland Kitchen levothyroxine (SYNTHROID, LEVOTHROID) 25 MCG tablet TAKE 1 TABLET DAILY. 90 tablet 1  . pramipexole (MIRAPEX)  0.125 MG tablet Take 1 tablet (0.125 mg total) by mouth 2 (two) times daily. 180 tablet 1  . rosuvastatin (CRESTOR) 10 MG tablet TAKE (1) TABLET ON MONDAY, WEDNESDAY AND FRIDAY. 30 tablet 3  . tolterodine (DETROL LA) 4 MG 24 hr capsule     . vitamin B-12 (CYANOCOBALAMIN) 1000 MCG tablet Take 1 tablet (1,000 mcg total) by mouth daily. 90 tablet 3  . vitamin E 400 UNIT capsule Take 400 Units by mouth daily.      No current facility-administered medications on file prior to visit.     Past Medical History:  Diagnosis Date  . Abnormal Pap smear of vagina 09/2006   Dr Nori Riis  . Hyperlipidemia   . Hypothyroidism   . Osteopenia    PMH fracture heel, wrist  . Pernicious anemia   . Post-polio syndrome    minor problems L leg  . Vitamin B12 deficiency     Past Surgical History:  Procedure Laterality Date  . BLADDER SUSPENSION  2000  . no colonoscopy     "I never felt I needed one "    Social History   Social History  . Marital status: Single    Spouse name: N/A  . Number of children: N/A  . Years of education: N/A   Social History Main Topics  . Smoking status: Never Smoker  . Smokeless tobacco: Never Used  . Alcohol use 0.0 oz/week     Comment: wine very rarely (at Christmas)  . Drug use: No  . Sexual activity: Not Asked   Other Topics Concern  . None   Social History Narrative  . None    Family History  Problem Relation Age of Onset  . Heart attack Maternal Grandfather     70s  . Emphysema Mother   . Hypertension Mother   . Alzheimer's disease Father   . Breast cancer Maternal Grandmother   . Cancer Maternal Grandmother     breast  . Diabetes Neg Hx   . Stroke Neg Hx   . Parkinson's disease Neg Hx     Review of Systems  Constitutional: Negative for appetite change, chills, fatigue and fever.  HENT: Negative for hearing loss and tinnitus.   Eyes: Negative for visual disturbance.  Respiratory: Negative for cough, shortness of breath and wheezing.    Cardiovascular: Positive for leg swelling (mild). Negative for chest pain and palpitations.  Gastrointestinal: Negative for abdominal pain, blood in stool, constipation, diarrhea and nausea.       Rare gerd  Genitourinary: Negative for dysuria and hematuria.  Musculoskeletal: Negative for arthralgias and back pain.  Skin: Negative for color change and rash.  Neurological: Negative for light-headedness and headaches.  Psychiatric/Behavioral: Negative for dysphoric mood. The patient is not nervous/anxious.        Objective:   Vitals:   03/04/17 1353  BP: 120/70  Pulse: 87  Temp: 97.7 F (36.5 C)   Filed Weights   03/04/17 1353  Weight: 93 lb 1.9 oz (42.2 kg)   Body mass index is 18.19 kg/m.  Wt Readings from Last 3 Encounters:  03/04/17 93 lb 1.9 oz (42.2 kg)  01/08/17 94 lb (42.6 kg)  09/18/16 95 lb 3 oz (43.2 kg)     Physical Exam Constitutional: She appears well-developed and well-nourished. No distress.  HENT:  Head: Normocephalic and atraumatic.  Right Ear: External ear normal. Normal ear canal and TM Left Ear: External ear normal.  Normal ear canal and TM Mouth/Throat: Oropharynx is clear and moist.  Eyes: Conjunctivae and EOM are normal.  Neck: Neck supple. No tracheal deviation present. No thyromegaly present.  No carotid bruit  Cardiovascular: Normal rate, regular rhythm and normal heart sounds.   No murmur heard.  No edema. Pulmonary/Chest: Effort normal and breath sounds normal. No respiratory distress. She has no wheezes. She has no rales.  Breast: deferred to Gyn Abdominal: Soft. She exhibits no distension. There is no tenderness.  Lymphadenopathy: She has no cervical adenopathy.  Skin: Skin is warm and dry. She is not diaphoretic.  Psychiatric: She has a normal mood and affect. Her behavior is normal.       Assessment & Plan:   Physical exam: Screening blood work ordered Immunizations - td today, deferred pneumonia vaccines Colonoscopy  - no  longer needed due to age 89 - no longer needed due to age 52 - sees Dr Nori Riis - appt scheduled Dexa done 2014 - EKG - sinus at 62 bpm, normal ekg Eye exams  Up to date  - Dr Gershon Crane EKG - none in chart - will do an EKG today for baseline Exercise - regular, daily Weight  Normal BMI Skin   No concerns Substance abuse  none  See Problem List for Assessment and Plan of chronic medical problems.  FU in 6 months

## 2017-03-04 NOTE — Assessment & Plan Note (Signed)
Taking carbidopa-levodopa

## 2017-04-09 ENCOUNTER — Ambulatory Visit: Payer: Medicare Other | Admitting: Neurology

## 2017-04-16 ENCOUNTER — Ambulatory Visit: Payer: Medicare Other | Admitting: Neurology

## 2017-05-12 ENCOUNTER — Other Ambulatory Visit: Payer: Self-pay | Admitting: Internal Medicine

## 2017-06-13 ENCOUNTER — Emergency Department (HOSPITAL_COMMUNITY): Payer: Medicare Other

## 2017-06-13 ENCOUNTER — Encounter (HOSPITAL_COMMUNITY): Payer: Self-pay | Admitting: Emergency Medicine

## 2017-06-13 ENCOUNTER — Emergency Department (HOSPITAL_COMMUNITY)
Admission: EM | Admit: 2017-06-13 | Discharge: 2017-06-13 | Disposition: A | Discharge status: Home / Self Care | Payer: Medicare Other | Attending: Emergency Medicine | Admitting: Emergency Medicine

## 2017-06-13 DIAGNOSIS — G20 Parkinson's disease: Secondary | ICD-10-CM | POA: Diagnosis not present

## 2017-06-13 DIAGNOSIS — Z79899 Other long term (current) drug therapy: Secondary | ICD-10-CM | POA: Insufficient documentation

## 2017-06-13 DIAGNOSIS — M791 Myalgia, unspecified site: Secondary | ICD-10-CM

## 2017-06-13 DIAGNOSIS — E039 Hypothyroidism, unspecified: Secondary | ICD-10-CM | POA: Insufficient documentation

## 2017-06-13 DIAGNOSIS — R531 Weakness: Secondary | ICD-10-CM

## 2017-06-13 DIAGNOSIS — N390 Urinary tract infection, site not specified: Secondary | ICD-10-CM | POA: Diagnosis not present

## 2017-06-13 DIAGNOSIS — Z7982 Long term (current) use of aspirin: Secondary | ICD-10-CM | POA: Insufficient documentation

## 2017-06-13 LAB — I-STAT CHEM 8, ED
BUN: 27 mg/dL — ABNORMAL HIGH (ref 6–20)
Calcium, Ion: 1.09 mmol/L — ABNORMAL LOW (ref 1.15–1.40)
Chloride: 105 mmol/L (ref 101–111)
Creatinine, Ser: 0.8 mg/dL (ref 0.44–1.00)
Glucose, Bld: 101 mg/dL — ABNORMAL HIGH (ref 65–99)
HEMATOCRIT: 32 % — AB (ref 36.0–46.0)
HEMOGLOBIN: 10.9 g/dL — AB (ref 12.0–15.0)
Potassium: 3.4 mmol/L — ABNORMAL LOW (ref 3.5–5.1)
SODIUM: 142 mmol/L (ref 135–145)
TCO2: 24 mmol/L (ref 0–100)

## 2017-06-13 LAB — URINALYSIS, ROUTINE W REFLEX MICROSCOPIC
Bilirubin Urine: NEGATIVE
Glucose, UA: NEGATIVE mg/dL
Ketones, ur: 20 mg/dL — AB
Nitrite: NEGATIVE
PH: 5 (ref 5.0–8.0)
Protein, ur: NEGATIVE mg/dL
SPECIFIC GRAVITY, URINE: 1.02 (ref 1.005–1.030)

## 2017-06-13 LAB — CBC WITH DIFFERENTIAL/PLATELET
BASOS PCT: 0 %
Basophils Absolute: 0 10*3/uL (ref 0.0–0.1)
EOS ABS: 0 10*3/uL (ref 0.0–0.7)
Eosinophils Relative: 0 %
HCT: 34.2 % — ABNORMAL LOW (ref 36.0–46.0)
HEMOGLOBIN: 11.4 g/dL — AB (ref 12.0–15.0)
Lymphocytes Relative: 17 %
Lymphs Abs: 1.3 10*3/uL (ref 0.7–4.0)
MCH: 28.4 pg (ref 26.0–34.0)
MCHC: 33.3 g/dL (ref 30.0–36.0)
MCV: 85.3 fL (ref 78.0–100.0)
MONO ABS: 0.8 10*3/uL (ref 0.1–1.0)
MONOS PCT: 11 %
NEUTROS PCT: 72 %
Neutro Abs: 5.6 10*3/uL (ref 1.7–7.7)
Platelets: 163 10*3/uL (ref 150–400)
RBC: 4.01 MIL/uL (ref 3.87–5.11)
RDW: 14.9 % (ref 11.5–15.5)
WBC: 7.8 10*3/uL (ref 4.0–10.5)

## 2017-06-13 LAB — MAGNESIUM: Magnesium: 2.1 mg/dL (ref 1.7–2.4)

## 2017-06-13 LAB — I-STAT TROPONIN, ED: TROPONIN I, POC: 0 ng/mL (ref 0.00–0.08)

## 2017-06-13 LAB — I-STAT CG4 LACTIC ACID, ED: Lactic Acid, Venous: 0.94 mmol/L (ref 0.5–1.9)

## 2017-06-13 MED ORDER — SULFAMETHOXAZOLE-TRIMETHOPRIM 800-160 MG PO TABS: 2.0000 | ORAL_TABLET | Freq: Two times a day (BID) | ORAL | 0 refills | Status: DC

## 2017-06-13 NOTE — Discharge Instructions (Signed)
Bactrim, antibiotic for urinary tract infection. Stop Crestor, it may be contributing to your muscle pain and weakness. Arrangements are made for home health evaluation with physical therapy, nursing, and occupational therapy on Monday.

## 2017-06-13 NOTE — ED Triage Notes (Signed)
Per EMS , pt. Is from home with complaint of fall at 5am this morning after using the bathroom , pt. Stated that she slid and fell , pt. Denied LOC ,. Pt. Attempted to get up but this time she felt weaker and unable to get up. Pt. Stated that she fell yesterday morning but did not call for help because she was able to get up by herself without difficulty. Denied dizziness. No obvious deformity reported but claimed of pain on left leg at 10/10. Not on blood thinner.

## 2017-06-13 NOTE — ED Notes (Signed)
Bed: [facility] Expected date:  Expected time:  Means of arrival:  Comments: 

## 2017-06-13 NOTE — ED Notes (Signed)
Breakfast tray given.  Pt is resting in bed waiting for social worker to come see her.

## 2017-06-13 NOTE — ED Provider Notes (Signed)
[facility] DEPT Provider Note   CSN: [id number] Arrival date & time: 06/13/17  0609     History   Chief Complaint Chief Complaint  Patient presents with  . Fall  . Leg Pain    HPI Elizabeth [name] is a 83 y.o. female. Complaint is weakness and ended up on the ground  HPI this is a 83 year old who describes progressive weakness over the last few. No strokes or other specific causes. She states that she fell several days ago but was able to get herself up. This morning she states that she felt worried that she would fall because of some muscle aches during the night. So she slid to the ground and called to the restroom. She was too weak to get herself off of the ground. However upon arrival paramedics when she was helped to her feet she was able to ambulate to the ambulance with them.  She describes muscle aches and occasional cramps. She is on Crestor and has been for several years. Did not injure herself. Her description of leg pain stating that she had some cramps but she does not have focal leg pain now.  Past Medical History:  Diagnosis Date  . Abnormal Pap smear of vagina 09/2006   Dr Nori Riis  . Hyperlipidemia   . Hypothyroidism   . Osteopenia    PMH fracture heel, wrist  . Pernicious anemia   . Post-polio syndrome    minor problems L leg  . Vitamin B12 deficiency     Patient Active Problem List   Diagnosis Date Noted  . Swallowing disorder 03/04/2017  . Parkinson disease ([place]) 06/25/2016  . Restless leg syndrome 12/25/2015  . Arthritis 12/25/2015  . Abnormality of gait 05/24/2015  . Intention tremor 12/08/2013  . History of CVA (cerebrovascular accident) 07/20/2012  . IRRITABLE BOWEL SYNDROME 01/17/2009  . Cervicalgia 01/17/2009  . Urinary incontinence 09/07/2008  . ANEMIA, B12 DEFICIENCY 04/26/2008  . HYPERLIPIDEMIA 01/26/2008  . History of TIA (transient ischemic attack) 01/26/2008  . Hypothyroidism 01/04/2008  . Osteopenia 01/04/2008    Past Surgical  History:  Procedure Laterality Date  . BLADDER SUSPENSION  2000  . no colonoscopy     "I never felt I needed one "    OB History    No data available       Home Medications    Prior to Admission medications   Medication Sig Start Date End Date Taking? Authorizing Provider  Carbidopa-Levodopa ER (RYTARY) 36.25-145 MG CPCR Take 1 tablet by mouth 3 (three) times daily. 01/08/17  Yes Tat, Eustace Quail, DO  levothyroxine (SYNTHROID, LEVOTHROID) 25 MCG tablet TAKE 1 TABLET DAILY. 10/29/16  Yes Burns, Claudina Lick, MD  rosuvastatin (CRESTOR) 10 MG tablet TAKE (1) TABLET ON MONDAY, WEDNESDAY AND FRIDAY. 05/12/17  Yes Burns, Claudina Lick, MD  Simethicone (GAS-X EXTRA STRENGTH) 125 MG CAPS Take 125 mg by mouth daily as needed (gas).   Yes [provider]  tolterodine (DETROL LA) 4 MG 24 hr capsule Take 4 mg by mouth daily.  09/03/16  Yes [provider]  vitamin B-12 (CYANOCOBALAMIN) 1000 MCG tablet Take 1 tablet (1,000 mcg total) by mouth daily. Patient taking differently: Take 5,000 mcg by mouth daily.  08/06/16  Yes Burns, Claudina Lick, MD  aspirin 81 MG tablet Take 81 mg by mouth daily.      [provider]  Calcium Carbonate-Vitamin D (CALCIUM + D PO) Take 1 tablet by mouth daily. Reported on 12/25/2015    [provider]  cholecalciferol (VITAMIN D) 1000 UNITS tablet Take 1,000 Units by mouth daily.      [provider]  pramipexole (MIRAPEX) 0.125 MG tablet Take 1 tablet (0.125 mg total) by mouth 2 (two) times daily. 09/18/16   Tat, Eustace Quail, DO  sulfamethoxazole-trimethoprim (BACTRIM DS,SEPTRA DS) 800-160 MG tablet Take 2 tablets by mouth 2 (two) times daily. 06/13/17   Tanna Furry, MD  vitamin E 400 UNIT capsule Take 400 Units by mouth daily.     [provider]    Family History Family History  Problem Relation Age of Onset  . Heart attack Maternal Grandfather        70s  . Emphysema Mother   . Hypertension Mother   . Alzheimer's disease Father     . Breast cancer Maternal Grandmother   . Cancer Maternal Grandmother        breast  . Diabetes Neg Hx   . Stroke Neg Hx   . Parkinson's disease Neg Hx     Social History Social History  Substance Use Topics  . Smoking status: Never Smoker  . Smokeless tobacco: Never Used  . Alcohol use 0.0 oz/week     Comment: wine very rarely (at Christmas)     Allergies   Atorvastatin and Vesicare [solifenacin]   Review of Systems Review of Systems  Constitutional: Negative for appetite change, chills, diaphoresis, fatigue and fever.  HENT: Negative for mouth sores, sore throat and trouble swallowing.   Eyes: Negative for visual disturbance.  Respiratory: Negative for cough, chest tightness, shortness of breath and wheezing.   Cardiovascular: Negative for chest pain.  Gastrointestinal: Negative for abdominal distention, abdominal pain, diarrhea, nausea and vomiting.  Endocrine: Negative for polydipsia, polyphagia and polyuria.  Genitourinary: Negative for dysuria, frequency and hematuria.  Musculoskeletal: Positive for myalgias. Negative for gait problem.  Skin: Negative for color change, pallor and rash.  Neurological: Positive for weakness. Negative for dizziness, syncope, light-headedness and headaches.  Hematological: Does not bruise/bleed easily.  Psychiatric/Behavioral: Negative for behavioral problems and confusion.     Physical Exam Updated Vital Signs BP (!) 123/55 (BP Location: Left Arm)   Pulse 75   Temp 98.3 F (36.8 C) (Oral)   Resp 16   SpO2 96%   Physical Exam  Constitutional: She is oriented to person, place, and time. She appears well-developed and well-nourished. No distress.  Small stature elderly-appearing female awake and alert oriented lucid  HENT:  Head: Normocephalic.  Eyes: Pupils are equal, round, and reactive to light. Conjunctivae are normal. No scleral icterus.  Conjunctivae are not pale  Neck: Normal range of motion. Neck supple. No thyromegaly  present.  Cardiovascular: Normal rate and regular rhythm.  Exam reveals no gallop and no friction rub.   No murmur heard. Pulmonary/Chest: Effort normal and breath sounds normal. No respiratory distress. She has no wheezes. She has no rales.  Abdominal: Soft. Bowel sounds are normal. She exhibits no distension. There is no tenderness. There is no rebound.  Musculoskeletal: Normal range of motion.  Full range of motion of the hips knees and ankles. Nontender over the pubic symphysis iliac crest greater trochanters. Nontender midline neck and back. Normal symmetric strength  Neurological: She is alert and oriented to person, place, and time.  Skin: Skin is warm and dry. No rash noted.  Psychiatric: She has a normal mood and affect. Her behavior is normal.     ED Treatments / Results  Labs (all labs ordered are listed, but  only abnormal results are displayed) Labs Reviewed  CBC WITH DIFFERENTIAL/PLATELET - Abnormal; Notable for the following:       Result Value   Hemoglobin 11.4 (*)    HCT 34.2 (*)    All other components within normal limits  URINALYSIS, ROUTINE W REFLEX MICROSCOPIC - Abnormal; Notable for the following:    Hgb urine dipstick SMALL (*)    Ketones, ur 20 (*)    Leukocytes, UA TRACE (*)    Bacteria, UA RARE (*)    Squamous Epithelial / LPF 0-5 (*)    All other components within normal limits  I-STAT CHEM 8, ED - Abnormal; Notable for the following:    Potassium 3.4 (*)    BUN 27 (*)    Glucose, Bld 101 (*)    Calcium, Ion 1.09 (*)    Hemoglobin 10.9 (*)    HCT 32.0 (*)    All other components within normal limits  URINE CULTURE  MAGNESIUM  CK  I-STAT CG4 LACTIC ACID, ED  I-STAT TROPONIN, ED  I-STAT CG4 LACTIC ACID, ED    EKG  EKG Interpretation None       Radiology Dg Chest Port 1 View  Result Date: 06/13/2017 CLINICAL DATA:  Weakness EXAM: PORTABLE CHEST 1 VIEW COMPARISON:  None. FINDINGS: Normal heart size and mediastinal contours. Low volume  portable chest. No acute infiltrate or edema. No effusion or pneumothorax. No acute osseous findings. IMPRESSION: No evidence of active disease. Electronically Signed   By: Monte Fantasia M.D.   On: 06/13/2017 07:40    Procedures Procedures (including critical care time)  Medications Ordered in ED Medications - No data to display   Initial Impression / Assessment and Plan / ED Course  I have reviewed the triage vital signs and the nursing notes.  Pertinent labs & imaging results that were available during my care of the patient were reviewed by me and considered in my medical decision making (see chart for details).     EKG and troponin negative. Afebrile. Not hypoxemic. Normal chest. Urine has some signs of infection. She is incontinent at baseline. Plan will be culture and empiric treatment with 3 days Bactrim. Encouraged to stop her Crestor as it may be continuing to her muscle pain and weakness CPK pending. I discussed the case with care management. Arrangements for PT, OT and home health care nursing evaluation. Rolling walker. Further ergonomic evaluation of the home. She has a friend with her here that will stay with her during the weekend to ensure that she is safe and able to ambulate.  Final Clinical Impressions(s) / ED Diagnoses   Final diagnoses:  Weakness  Urinary tract infection without hematuria, site unspecified  Muscle pain    New Prescriptions New Prescriptions   SULFAMETHOXAZOLE-TRIMETHOPRIM (BACTRIM DS,SEPTRA DS) 800-160 MG TABLET    Take 2 tablets by mouth 2 (two) times daily.     Tanna Furry, MD 06/13/17 1006

## 2017-06-13 NOTE — Care Management Note (Signed)
Case Management Note  Patient Details  Name: Elizabeth [name] MRN: [id number] Date of Birth: [birth year]  Subjective/Objective:     Fall, weakness               Action/Plan: Discharge Planning: NCM spoke to pt and friend, Bonnee Quin # [phone]. Offered choice for HH/list provided. Pt agreeable to [facility]. Contacted [facility] rep with new referral. Friend states she had RW and they wanted to pick up her RW and 3n1 bedside commode at [place] store. Provided friend with address of [place] retail store on [address]. Faxed DME orders to [facility] to process and put note pt will pick up in retail store.   PCP  Expected Discharge Date:  06/13/2017               Expected Discharge Plan:  [place]  In-House Referral:  NA  Discharge planning Services  CM Consult  Post Acute Care Choice:  Home Health Choice offered to:  Patient  DME Arranged:  3-N-1, Walker rolling DME Agency:  [place]:  PT, OT, RN, Nurse's Aide [place] Agency:  [place]  Status of Service:  Completed, signed off  If discussed at [place] of Stay Meetings, dates discussed:    Additional Comments:  Erenest Rasher, RN 06/13/2017, 5:22 PM

## 2017-06-13 NOTE — ED Notes (Signed)
X-ray at bedside will collect urine and EKG when they finish

## 2017-06-13 NOTE — ED Notes (Signed)
Social worker at bedside to set up home health needs.

## 2017-06-13 NOTE — ED Notes (Signed)
Bed: SN05 Expected date:  Expected time:  Means of arrival:  Comments: EMS 83 yo female from home/fall yesterday during the day-slid out of bed this am-left leg pain-no obvious deformities

## 2017-06-15 LAB — URINE CULTURE

## 2017-06-16 ENCOUNTER — Telehealth: Payer: Self-pay | Admitting: Emergency Medicine

## 2017-06-16 NOTE — Telephone Encounter (Signed)
Post ED Visit - Positive Culture Follow-up: Successful Patient Follow-Up  Culture assessed and recommendations reviewed by: []  Elenor Quinones, Pharm.D. []  Heide Guile, Pharm.D., BCPS AQ-ID []  Parks Neptune, Pharm.D., BCPS []  Alycia Rossetti, Pharm.D., BCPS []  [place], Pharm.D., BCPS, AAHIVP []  Legrand Como, Pharm.D., BCPS, AAHIVP []  Salome Arnt, PharmD, BCPS []  Dimitri Ped, PharmD, BCPS []  Vincenza Hews, PharmD, BCPS Jimmy Footman Pharm D  Positive urine culture  []  Patient discharged without antimicrobial prescription and treatment is now indicated [x]  Organism is resistant to prescribed ED discharge antimicrobial []  Patient with positive blood cultures  Changes discussed with ED provider: Martinique Russo PA New antibiotic prescription d/c Bactrim, if symptomatic, start Cephalexin 500mg  po q 12 hours x 7 days  Attempting to contact patient   Hazle Nordmann 06/16/2017, 11:19 AM

## 2017-06-16 NOTE — Telephone Encounter (Signed)
Post ED Visit - Positive Culture Follow-up: Successful Patient Follow-Up  Culture assessed and recommendations reviewed by: []  Elenor Quinones, Pharm.D. []  Heide Guile, Pharm.D., BCPS AQ-ID []  Parks Neptune, Pharm.D., BCPS []  Alycia Rossetti, Pharm.D., BCPS []  [place], Pharm.D., BCPS, AAHIVP []  Legrand Como, Pharm.D., BCPS, AAHIVP []  Salome Arnt, PharmD, BCPS []  Dimitri Ped, PharmD, BCPS []  Vincenza Hews, PharmD, BCPS Jimmy Footman PharmD  Positive urine culture  []  Patient discharged without antimicrobial prescription and treatment is now indicated [x]  Organism is resistant to prescribed ED discharge antimicrobial []  Patient with positive blood cultures  Changes discussed with ED provider: Martinique Russo PharmD New antibiotic prescription stop Bactrim DS, still with left flank pain, start Cephalexin 500mg  po q 12hours x 7 days Called to [place]  Contacted patient, 06/16/2017 [place] 06/16/2017, 11:43 AM

## 2017-06-16 NOTE — Progress Notes (Signed)
ED Antimicrobial Stewardship Positive Culture Follow Up   Elizabeth [name] is an 83 y.o. female who presented to [facility] on 06/13/2017 with a chief complaint of  Chief Complaint  Patient presents with  . Fall  . Leg Pain    Recent Results (from the past 720 hour(s))  Urine Culture     Status: Abnormal   Collection Time: 06/13/17  8:00 AM  Result Value Ref Range Status   Specimen Description URINE, CATHETERIZED  Final   Special Requests NONE  Final   Culture (A)  Final    >=100,000 COLONIES/mL AEROCOCCUS URINAE Standardized susceptibility testing for this organism is not available. Performed at [facility], [place] [address], [place], [place] [address]    Report Status 06/15/2017 FINAL  Final    [x]  Treated with Bactrim, organism likely resistant to prescribed antimicrobial []  Patient discharged originally without antimicrobial agent and treatment is now indicated  New antibiotic prescription: Call patient. If symptomatic, cephalexin 500 mg BID X 7 days. If no symptoms, stop antibiotics.   ED Provider: Martinique Russo, PA-C   Susa Raring, PharmD 06/16/2017, 9:45 AM Infectious Diseases Pharmacy Resident  Phone# [phone]

## 2017-06-18 ENCOUNTER — Telehealth: Payer: Self-pay | Admitting: Internal Medicine

## 2017-06-18 NOTE — Telephone Encounter (Signed)
MD out of office pls advice in her absence...Elizabeth [name]

## 2017-06-18 NOTE — Telephone Encounter (Signed)
Ok for verbals 

## 2017-06-18 NOTE — Telephone Encounter (Signed)
Called Sree, LVM informing him on the ok for verbal orders for PT.

## 2017-06-18 NOTE — Telephone Encounter (Signed)
Wants verbals for PT for muscle strength and balance  1week 1 2week 2 1week 2

## 2017-06-19 ENCOUNTER — Telehealth: Payer: Self-pay | Admitting: *Deleted

## 2017-06-19 NOTE — Telephone Encounter (Signed)
Rec'd call from Timmothy Sours, [place] w/[place] have ? Concerning pt medications. He stated pt was seen at ED was given rx for Bactrim, and then she stated someone called her to inform her to stop the Bactrim and a new medicine would be called to her pharmacy. Pt did pick up Cephalexin wanting to clarify if she should take both, or what to do w/Bactrim. Inform Don per pt chart on 06/16/17 nurse form ED called pt gave him MD instructions from 7/24 (see below)  New antibiotic prescription stop Bactrim DS, still with left flank pain, start Cephalexin 500mg  po q 12hours x 7 days Called to [place]  Did inform Timmothy Sours pt can throw the Bactrim away, can be flushed down toilet...Johny Chess

## 2017-06-22 ENCOUNTER — Telehealth: Payer: Self-pay | Admitting: Internal Medicine

## 2017-06-22 NOTE — Telephone Encounter (Signed)
Patient went to ER for UTI.  Patient has stopped Crestor.  Patient was having muscle cramps and ER suggested she talk with provider in regard but patient stopped all together.   Patient was originally prescribed Bactrim at ED.  Once culture came back.  Patient was prescribed sulfasalazine.  States patient has not taken any of this medication. Also states that patient is to take carbidopa - Levodopa is to be taken 3 times a day.  Patient only is taking once a day.   Has placed medication in pill box for patient but she is refusing for this to be done.

## 2017-06-22 NOTE — Telephone Encounter (Signed)
noted 

## 2017-06-22 NOTE — Telephone Encounter (Signed)
Notified nurse w/MD response. Pt has appt already set-up for Wednesday on 06/24/17. Nurse states pt will not take any of the medications, and she want to inform MD to be aware...Elizabeth [name]

## 2017-06-22 NOTE — Telephone Encounter (Signed)
She should follow up regarding the crestor.    The ED called her and advised her to stop the bactrim and start cephalexin which they sent to the ED.  We can recheck her urine if needed, but she should probably complete the new antibiotic sent to [facility].  She should follow up with neurology regarding the carbidopa-levodopa

## 2017-06-24 ENCOUNTER — Encounter: Payer: Self-pay | Admitting: Internal Medicine

## 2017-06-24 ENCOUNTER — Ambulatory Visit (INDEPENDENT_AMBULATORY_CARE_PROVIDER_SITE_OTHER): Payer: Medicare Other | Admitting: Internal Medicine

## 2017-06-24 DIAGNOSIS — R531 Weakness: Secondary | ICD-10-CM

## 2017-06-24 DIAGNOSIS — N390 Urinary tract infection, site not specified: Secondary | ICD-10-CM | POA: Diagnosis not present

## 2017-06-24 DIAGNOSIS — E782 Mixed hyperlipidemia: Secondary | ICD-10-CM

## 2017-06-24 HISTORY — DX: Weakness: R53.1

## 2017-06-24 NOTE — Assessment & Plan Note (Addendum)
Generalized - ? Related to UTI, but also likely related to overall health Doing PT at home - will consider outpatient patient PT after if needed Stressed doing regular exercises Follow up in 1 month, sooner if needed

## 2017-06-24 NOTE — Assessment & Plan Note (Addendum)
Still taking keflex - will complete No concerning symptoms, strength improving Will call or return if she has any concerning symptoms

## 2017-06-24 NOTE — Patient Instructions (Addendum)
  Continue doing physical therapy.  Complete the antibiotic.   Medications reviewed and updated.  No changes recommended at this time.   Please followup in 4 weeks

## 2017-06-24 NOTE — Progress Notes (Signed)
Subjective:    Patient ID: Elizabeth [name], female    DOB: 14-Jul-1931, 83 y.o.   MRN: [id number]  HPI The patient is here for follow up from the hospital.   She went to the hospital 7/21 for weakness.  She was experiencing progressive weakness over the few days prior to going to the emergency room. She fell at home the day before going to the ED. That night she was experiencing muscle aches was concerned she may fall again. She slid to the ground and crawled to the restroom. She was too weak to get herself off the ground and called the paramedics. When they arrived she was able to ambulate to the ambulance with them. She did describe muscle aches and occasional cramping. She has been on Crestor for years and is concerned that was the cause. She denies any injuries at home.  An EKG and troponin were normal. Her vital signs were stable and she is not hypoxic. She normal chest x-ray. Her urine showed possible sinus infection and she was diagnosed with a UTI and placed on Bactrim. She was advised to stop the Crestor to see if her muscle aches and weakness improved.  She was referred for PT, OT and home health nursing. A rolling walker was prescribed.  The emergency room called her and advised to switch to a different antibiotic - keflex.  She has a couple of pills left.  She did stop the Crestor. Her cramping and muscle aches are better.  Her urine culture showed  >=100,000 COLONIES/mL AEROCOCCUS URINAE.   The PT has been out already.  She states she does not have any strength.  She feels weak. She does feel better since leaving the hospital.     Medications and allergies reviewed with patient and updated if appropriate.  Patient Active Problem List   Diagnosis Date Noted  . Weakness 06/24/2017  . UTI (urinary tract infection) 06/24/2017  . Swallowing disorder 03/04/2017  . Parkinson disease ([place]) 06/25/2016  . Restless leg syndrome 12/25/2015  . Arthritis 12/25/2015  . Abnormality of gait  05/24/2015  . Intention tremor 12/08/2013  . History of CVA (cerebrovascular accident) 07/20/2012  . IRRITABLE BOWEL SYNDROME 01/17/2009  . Cervicalgia 01/17/2009  . Urinary incontinence 09/07/2008  . ANEMIA, B12 DEFICIENCY 04/26/2008  . HYPERLIPIDEMIA 01/26/2008  . History of TIA (transient ischemic attack) 01/26/2008  . Hypothyroidism 01/04/2008  . Osteopenia 01/04/2008    Current Outpatient Prescriptions on File Prior to Visit  Medication Sig Dispense Refill  . aspirin 81 MG tablet Take 81 mg by mouth daily.      . Calcium Carbonate-Vitamin D (CALCIUM + D PO) Take 1 tablet by mouth daily. Reported on 12/25/2015    . Carbidopa-Levodopa ER (RYTARY) 36.25-145 MG CPCR Take 1 tablet by mouth 3 (three) times daily. 100 capsule 0  . cholecalciferol (VITAMIN D) 1000 UNITS tablet Take 1,000 Units by mouth daily.      Marland Kitchen levothyroxine (SYNTHROID, LEVOTHROID) 25 MCG tablet TAKE 1 TABLET DAILY. 90 tablet 1  . pramipexole (MIRAPEX) 0.125 MG tablet Take 1 tablet (0.125 mg total) by mouth 2 (two) times daily. 180 tablet 1  . Simethicone (GAS-X EXTRA STRENGTH) 125 MG CAPS Take 125 mg by mouth daily as needed (gas).    . tolterodine (DETROL LA) 4 MG 24 hr capsule Take 4 mg by mouth daily.     . vitamin B-12 (CYANOCOBALAMIN) 1000 MCG tablet Take 1 tablet (1,000 mcg total) by mouth daily. (Patient taking  differently: Take 5,000 mcg by mouth daily. ) 90 tablet 3  . vitamin E 400 UNIT capsule Take 400 Units by mouth daily.      No current facility-administered medications on file prior to visit.     Past Medical History:  Diagnosis Date  . Abnormal Pap smear of vagina 09/2006   Dr Nori Riis  . Hyperlipidemia   . Hypothyroidism   . Osteopenia    PMH fracture heel, wrist  . Pernicious anemia   . Post-polio syndrome    minor problems L leg  . Vitamin B12 deficiency     Past Surgical History:  Procedure Laterality Date  . BLADDER SUSPENSION  2000  . no colonoscopy     "I never felt I needed one "      Social History   Social History  . Marital status: Single    Spouse name: N/A  . Number of children: N/A  . Years of education: N/A   Social History Main Topics  . Smoking status: Never Smoker  . Smokeless tobacco: Never Used  . Alcohol use 0.0 oz/week     Comment: wine very rarely (at Christmas)  . Drug use: No  . Sexual activity: Not on file   Other Topics Concern  . Not on file   Social History Narrative  . No narrative on file    Family History  Problem Relation Age of Onset  . Heart attack Maternal Grandfather        70s  . Emphysema Mother   . Hypertension Mother   . Alzheimer's disease Father   . Breast cancer Maternal Grandmother   . Cancer Maternal Grandmother        breast  . Diabetes Neg Hx   . Stroke Neg Hx   . Parkinson's disease Neg Hx     Review of Systems  Constitutional: Positive for fatigue (low energy). Negative for appetite change, chills and fever.  Respiratory: Negative for cough, shortness of breath and wheezing.   Cardiovascular: Negative for chest pain, palpitations and leg swelling.  Gastrointestinal: Negative for abdominal pain and diarrhea.  Genitourinary: Negative for dysuria, frequency and hematuria.  Neurological: Negative for light-headedness and headaches.       Objective:   Vitals:   06/24/17 1306  BP: 114/64  Pulse: 71  Resp: 16  Temp: 98.1 F (36.7 C)   Wt Readings from Last 3 Encounters:  06/24/17 91 lb (41.3 kg)  03/04/17 93 lb 1.9 oz (42.2 kg)  01/08/17 94 lb (42.6 kg)   Body mass index is 17.77 kg/m.   Physical Exam    Constitutional: elderly, frail female. No distress.  HENT:  Head: Normocephalic and atraumatic.  Neck: Neck supple. No tracheal deviation present. No thyromegaly present.  No cervical lymphadenopathy Cardiovascular: Normal rate, regular rhythm and normal heart sounds.   No murmur heard. No carotid bruit .  1+ b/l LE edema Pulmonary/Chest: Effort normal and breath sounds normal. No  respiratory distress. No has no wheezes. No rales.  Abdomen: soft, non tender, non distended Skin: Skin is warm and dry. Not diaphoretic.  Psychiatric: Normal mood and affect. Behavior is normal.      Assessment & Plan:    See Problem List for Assessment and Plan of chronic medical problems.

## 2017-06-24 NOTE — Assessment & Plan Note (Addendum)
Stopped crestor - was causing cramping and muscle aches - symptoms have improved Will check lipid panel at next visit -  Can consider a lower dose, but I am concerned she may still have side effects Increase activity

## 2017-06-25 DIAGNOSIS — G20 Parkinson's disease: Secondary | ICD-10-CM | POA: Diagnosis not present

## 2017-06-25 DIAGNOSIS — M542 Cervicalgia: Secondary | ICD-10-CM | POA: Diagnosis not present

## 2017-06-25 DIAGNOSIS — M1991 Primary osteoarthritis, unspecified site: Secondary | ICD-10-CM | POA: Diagnosis not present

## 2017-06-25 DIAGNOSIS — N390 Urinary tract infection, site not specified: Secondary | ICD-10-CM | POA: Diagnosis not present

## 2017-06-26 ENCOUNTER — Telehealth: Payer: Self-pay | Admitting: Internal Medicine

## 2017-06-26 DIAGNOSIS — M542 Cervicalgia: Secondary | ICD-10-CM | POA: Diagnosis not present

## 2017-06-26 DIAGNOSIS — N390 Urinary tract infection, site not specified: Secondary | ICD-10-CM | POA: Diagnosis not present

## 2017-06-26 DIAGNOSIS — M1991 Primary osteoarthritis, unspecified site: Secondary | ICD-10-CM | POA: Diagnosis not present

## 2017-06-26 DIAGNOSIS — G20 Parkinson's disease: Secondary | ICD-10-CM | POA: Diagnosis not present

## 2017-06-26 NOTE — Telephone Encounter (Signed)
Start miralax daily until she has a bowel movement.  She should take miralax daily or as needed if she has not had a bowel movement after a couple of days

## 2017-06-26 NOTE — Telephone Encounter (Signed)
Spoke with pt to inform.  

## 2017-06-26 NOTE — Telephone Encounter (Signed)
OT called and states he in the home with the pt and the pt states she has not had a bowel moevement in a week. He would like a call back    Also give the Pt a call at [phone]

## 2017-06-29 ENCOUNTER — Telehealth: Payer: Self-pay | Admitting: Internal Medicine

## 2017-06-29 DIAGNOSIS — G20 Parkinson's disease: Secondary | ICD-10-CM

## 2017-06-29 DIAGNOSIS — R531 Weakness: Secondary | ICD-10-CM

## 2017-06-29 DIAGNOSIS — M1991 Primary osteoarthritis, unspecified site: Secondary | ICD-10-CM | POA: Diagnosis not present

## 2017-06-29 DIAGNOSIS — M542 Cervicalgia: Secondary | ICD-10-CM | POA: Diagnosis not present

## 2017-06-29 DIAGNOSIS — N390 Urinary tract infection, site not specified: Secondary | ICD-10-CM | POA: Diagnosis not present

## 2017-06-29 DIAGNOSIS — R269 Unspecified abnormalities of gait and mobility: Secondary | ICD-10-CM

## 2017-06-29 MED ORDER — ROLLATOR ULTRA-LIGHT MISC: 0 refills | Status: DC

## 2017-06-29 NOTE — Telephone Encounter (Signed)
printed

## 2017-06-29 NOTE — Telephone Encounter (Signed)
Pt had a fall yesterday, bend over and fell.  No injury   rollader walker , need script [facility]    Reporting height 4"11 Weight 87    Ree from [place]

## 2017-06-30 DIAGNOSIS — N390 Urinary tract infection, site not specified: Secondary | ICD-10-CM | POA: Diagnosis not present

## 2017-06-30 DIAGNOSIS — M542 Cervicalgia: Secondary | ICD-10-CM | POA: Diagnosis not present

## 2017-06-30 DIAGNOSIS — G20 Parkinson's disease: Secondary | ICD-10-CM | POA: Diagnosis not present

## 2017-06-30 DIAGNOSIS — M1991 Primary osteoarthritis, unspecified site: Secondary | ICD-10-CM | POA: Diagnosis not present

## 2017-06-30 NOTE — Telephone Encounter (Signed)
Orders faxed to [facility].  

## 2017-07-01 DIAGNOSIS — G20 Parkinson's disease: Secondary | ICD-10-CM | POA: Diagnosis not present

## 2017-07-01 DIAGNOSIS — N390 Urinary tract infection, site not specified: Secondary | ICD-10-CM | POA: Diagnosis not present

## 2017-07-01 DIAGNOSIS — M1991 Primary osteoarthritis, unspecified site: Secondary | ICD-10-CM | POA: Diagnosis not present

## 2017-07-01 DIAGNOSIS — M542 Cervicalgia: Secondary | ICD-10-CM | POA: Diagnosis not present

## 2017-07-02 DIAGNOSIS — M1991 Primary osteoarthritis, unspecified site: Secondary | ICD-10-CM | POA: Diagnosis not present

## 2017-07-02 DIAGNOSIS — N390 Urinary tract infection, site not specified: Secondary | ICD-10-CM | POA: Diagnosis not present

## 2017-07-02 DIAGNOSIS — M542 Cervicalgia: Secondary | ICD-10-CM | POA: Diagnosis not present

## 2017-07-02 DIAGNOSIS — M8588 Other specified disorders of bone density and structure, other site: Secondary | ICD-10-CM | POA: Diagnosis not present

## 2017-07-02 DIAGNOSIS — G20 Parkinson's disease: Secondary | ICD-10-CM | POA: Diagnosis not present

## 2017-07-03 DIAGNOSIS — Z9181 History of falling: Secondary | ICD-10-CM

## 2017-07-03 DIAGNOSIS — M8580 Other specified disorders of bone density and structure, unspecified site: Secondary | ICD-10-CM | POA: Diagnosis not present

## 2017-07-03 DIAGNOSIS — E039 Hypothyroidism, unspecified: Secondary | ICD-10-CM | POA: Diagnosis not present

## 2017-07-03 DIAGNOSIS — G14 Postpolio syndrome: Secondary | ICD-10-CM | POA: Diagnosis not present

## 2017-07-03 DIAGNOSIS — R32 Unspecified urinary incontinence: Secondary | ICD-10-CM | POA: Diagnosis not present

## 2017-07-03 DIAGNOSIS — M1991 Primary osteoarthritis, unspecified site: Secondary | ICD-10-CM | POA: Diagnosis not present

## 2017-07-03 DIAGNOSIS — E785 Hyperlipidemia, unspecified: Secondary | ICD-10-CM | POA: Diagnosis not present

## 2017-07-03 DIAGNOSIS — D510 Vitamin B12 deficiency anemia due to intrinsic factor deficiency: Secondary | ICD-10-CM | POA: Diagnosis not present

## 2017-07-03 DIAGNOSIS — K589 Irritable bowel syndrome without diarrhea: Secondary | ICD-10-CM | POA: Diagnosis not present

## 2017-07-03 DIAGNOSIS — G2581 Restless legs syndrome: Secondary | ICD-10-CM | POA: Diagnosis not present

## 2017-07-03 DIAGNOSIS — N390 Urinary tract infection, site not specified: Secondary | ICD-10-CM | POA: Diagnosis not present

## 2017-07-03 DIAGNOSIS — Z7982 Long term (current) use of aspirin: Secondary | ICD-10-CM

## 2017-07-03 DIAGNOSIS — G20 Parkinson's disease: Secondary | ICD-10-CM | POA: Diagnosis not present

## 2017-07-03 DIAGNOSIS — M542 Cervicalgia: Secondary | ICD-10-CM | POA: Diagnosis not present

## 2017-07-03 DIAGNOSIS — Z8673 Personal history of transient ischemic attack (TIA), and cerebral infarction without residual deficits: Secondary | ICD-10-CM

## 2017-07-07 DIAGNOSIS — M1991 Primary osteoarthritis, unspecified site: Secondary | ICD-10-CM | POA: Diagnosis not present

## 2017-07-07 DIAGNOSIS — N390 Urinary tract infection, site not specified: Secondary | ICD-10-CM | POA: Diagnosis not present

## 2017-07-07 DIAGNOSIS — M542 Cervicalgia: Secondary | ICD-10-CM | POA: Diagnosis not present

## 2017-07-07 DIAGNOSIS — G20 Parkinson's disease: Secondary | ICD-10-CM | POA: Diagnosis not present

## 2017-07-08 DIAGNOSIS — G20 Parkinson's disease: Secondary | ICD-10-CM | POA: Diagnosis not present

## 2017-07-08 DIAGNOSIS — M542 Cervicalgia: Secondary | ICD-10-CM | POA: Diagnosis not present

## 2017-07-08 DIAGNOSIS — N390 Urinary tract infection, site not specified: Secondary | ICD-10-CM | POA: Diagnosis not present

## 2017-07-08 DIAGNOSIS — M1991 Primary osteoarthritis, unspecified site: Secondary | ICD-10-CM | POA: Diagnosis not present

## 2017-07-10 DIAGNOSIS — G20 Parkinson's disease: Secondary | ICD-10-CM | POA: Diagnosis not present

## 2017-07-10 DIAGNOSIS — M1991 Primary osteoarthritis, unspecified site: Secondary | ICD-10-CM | POA: Diagnosis not present

## 2017-07-10 DIAGNOSIS — N390 Urinary tract infection, site not specified: Secondary | ICD-10-CM | POA: Diagnosis not present

## 2017-07-10 DIAGNOSIS — M542 Cervicalgia: Secondary | ICD-10-CM | POA: Diagnosis not present

## 2017-07-13 NOTE — Progress Notes (Deleted)
Elizabeth [name] was seen today in the movement disorders clinic for neurologic consultation at the request of [name], Elizabeth Lick, MD.  The consultation is for the evaluation of tremor and to r/o PD.  The tremor is in the L hand and is noted if she picks up something.  She is right hand dominant.  She has noted tremor for max of 2 years.  It really has not changed with time.  No family hx of tremor.  Denies leg tremor or head tremor.     Specific Symptoms:  Tremor: Yes.     Affected by caffeine:  No. (1 cup coffee per day)  Affected by alcohol:  No. (only drinks at christmas)  Affected by stress:  Yes.    Affected by fatigue:  Yes.    Spills soup if on spoon:  No. (involves non dominant hand)  Spills glass of liquid if full:  No. (involves non dominant hand)  Affects ADL's (tying shoes, brushing teeth, etc):  No.   By far her biggest c/o is inability to sit comfortably because of the need to move the legs when sitting.  "If I don't move them, I feel like I will scream."  It generally happens when she tries to watch TV.  She was told by her dermatologist that it was due to venous insufficiency.  If she gets up and moves around the sx's are better.  She can sit and play bridge and scrabble for hours without problem but cannot watch TV without getting up and moving around  Any other sx's:   Voice: no change Sleep: trouble getting asleep because legs are not comfortable; "part of it may be my nervous"  Vivid Dreams:  Yes.  , "I feel like my mother is in the room with me and she is deceased"  Acting out dreams:  No., but "I live by myself" Wet Pillows: No. Postural symptoms:  Yes.    Falls?  Yes.  ; went to ED on 12/13/14 after a fall between the commode and the wall and she hit her head.  She drove herself to the ED.  States that she had a similar fall in 2013. Bradykinesia symptoms: difficulty getting out of a chair Loss of smell:  No. Loss of taste:  No. Urinary Incontinence:  Yes.  , bladder  suspension 20 years ago but gotten worse again (on myrbetriq) Difficulty Swallowing:  No. Handwriting, micrographia: No. Trouble buttoning clothing: Yes.   Depression:  Yes.   (worse since retirement) Memory changes:  No. Hallucinations:  No.  visual distortions: No. N/V:  No. Lightheaded:  No.  Syncope: No. Diplopia:  No. Dyskinesia:  No.   09/13/15 update:  The patient returns today for follow-up.  Last visit, I started her on low-dose Mirapex, 0.125 mg for her restless leg. Pt states that the medication really helped and "I need it for the rest of my days."   She had some blood work and she was anemic with a hemoglobin of 11.  I had intended to have her iron stores/TIBC drawn but for some reason that was not done.  Neuroimaging has previously been performed.  It is available for my review today.  Had a CT brain in the ED in Jan 2016 after a fall.  There was an old infact in the R occipito-parietal region and L caudate region; there was small vessel disease and atrophy.  03/13/16 update:  The patient is following up today and has a history  of restless leg syndrome.  She is on Mirapex, 0.125 mg.  She was slightly anemic when I checked her hemoglobin last October (1.6) and her primary care physician rechecked this in February and her hemoglobin was 11.0.  Overall, this has been stable for quite some time.  Her iron stores and iron saturation were good in October.  With the medication, she has been sleeping well.  Her biggest c/o is neck pain x several months.  She saw Dr. Noemi Chapel and states that she had a cortisone injection but that was in the right shoulder.  Sometimes, the pain radiates in the neck.  X rays were done of the cervical spine but no other neuroimaging.  No PT for the neck.     04/24/16 update:  The patient is following up today.  Last visit, she looked more parkinsonian than previous visits, so I decided to start her on a trial of carbidopa/levodopa 25/100, one tablet 3 times per day.   Pt states this definitely helped.   She is also on Mirapex 0.125 mg at night.  She states that it is helping but is having some sx during the day and it drives her nuts; the last time she played bingo, she had to keep getting up.  She denies any falls since last visit.  No lightheadedness or near syncope.  No hallucinations.  She did have an MRI of the cervical spine that demonstrated a disc protrusion at C5-C6 level.  There was bilateral moderate neural foraminal stenosis at the C3/C4 level and C5/C6 level.  She was referred to physical therapy.  She has an appt upcoming.  States that she has had "neck popping" but no significant pain.  Asks me for a neck brace because she feels that she is humping over some and has trouble keeping neck up.    09/18/16 update:  The patient follows up today, on carbidopa/levodopa 25/100, one tablet 3 times per day.  She is also on pramipexole, 0.125 mg twice a day for restless leg syndrome.  She denies any cognitive change.  She denies compulsive behaviors.  She denies sleep attacks.  She denies falls.  She denies lightheadedness or near syncope.  No hallucinations.  He has been attending physical therapy for cervicalgia since our last visit.  It helped that and her balance and strength.  She states "I need more."  I have reviewed those records.  01/08/17 update:   Patient follows up today.  The patient is on carbidopa/levodopa, one tablet 3 times per day.  She is having trouble swallowing this and "I about gag."  Asks me if there is something else she can try.   She is also on pramipexole 0.125 mg twice per day for restless leg.  Overall, the patient states that she has been doing well since our last visit.  She denies lightheadedness or near syncope.  No hallucinations.  No cognitive change.  No falls.  07/14/17 update:  Patient seen today in follow-up for her Parkinson's disease.  I have reviewed records since our last visit.  She was changed last visit from traditional  carbidopa/levodopa immediate release to Rytary, 145 mg 3 times per day.  This is primarily because of swallowing difficulties.  She states that ***.  She did also complain of swallowing difficulties when she went to her Medicare wellness examination, but deferred further workup.  She went to the emergency room with a fall/weakness in July.  This occurred after slipping and falling in the bathroom the  day prior to presentation and feeling progressively weaker for a few days.  Patient found to have a urinary tract infection after culture was performed.  A referral for home health was placed because of weakness and balance issues.  She also had a fall on 06/29/2017.  She was bending over and fell.  Following this fall, therapy requested a prescription for a Rollator.  The patient reports that she is still on pramipexole, 0.125 mg twice per day for restless leg.  She has had no compulsive behaviors.  No cognitive change.  No hallucinations.    ALLERGIES:   Allergies  Allergen Reactions  . Atorvastatin     REACTION: Felt funny (only way patient could describe)  . Crestor [Rosuvastatin Calcium]     Leg cramps, muscle aches  . Vesicare [Solifenacin]     constipation    CURRENT MEDICATIONS:  Outpatient Encounter Prescriptions as of 07/14/2017  Medication Sig  . aspirin 81 MG tablet Take 81 mg by mouth daily.    . Calcium Carbonate-Vitamin D (CALCIUM + D PO) Take 1 tablet by mouth daily. Reported on 12/25/2015  . Carbidopa-Levodopa ER (RYTARY) 36.25-145 MG CPCR Take 1 tablet by mouth 3 (three) times daily.  . cephALEXin (KEFLEX) 500 MG capsule   . cholecalciferol (VITAMIN D) 1000 UNITS tablet Take 1,000 Units by mouth daily.    Marland Kitchen levothyroxine (SYNTHROID, LEVOTHROID) 25 MCG tablet TAKE 1 TABLET DAILY.  . Misc. Devices (ROLLATOR ULTRA-LIGHT) MISC Use as directed  . pramipexole (MIRAPEX) 0.125 MG tablet Take 1 tablet (0.125 mg total) by mouth 2 (two) times daily.  . Simethicone (GAS-X EXTRA STRENGTH)  125 MG CAPS Take 125 mg by mouth daily as needed (gas).  . tolterodine (DETROL LA) 4 MG 24 hr capsule Take 4 mg by mouth daily.   . vitamin B-12 (CYANOCOBALAMIN) 1000 MCG tablet Take 1 tablet (1,000 mcg total) by mouth daily. (Patient taking differently: Take 5,000 mcg by mouth daily. )  . vitamin E 400 UNIT capsule Take 400 Units by mouth daily.    No facility-administered encounter medications on file as of 07/14/2017.     PAST MEDICAL HISTORY:   Past Medical History:  Diagnosis Date  . Abnormal Pap smear of vagina 09/2006   Dr Nori Riis  . Hyperlipidemia   . Hypothyroidism   . Osteopenia    PMH fracture heel, wrist  . Pernicious anemia   . Post-polio syndrome    minor problems L leg  . Vitamin B12 deficiency     PAST SURGICAL HISTORY:   Past Surgical History:  Procedure Laterality Date  . BLADDER SUSPENSION  2000  . no colonoscopy     "I never felt I needed one "    SOCIAL HISTORY:   Social History   Social History  . Marital status: Single    Spouse name: N/A  . Number of children: N/A  . Years of education: N/A   Occupational History  . Not on file.   Social History Main Topics  . Smoking status: Never Smoker  . Smokeless tobacco: Never Used  . Alcohol use 0.0 oz/week     Comment: wine very rarely (at Christmas)  . Drug use: No  . Sexual activity: Not on file   Other Topics Concern  . Not on file   Social History Narrative  . No narrative on file    FAMILY HISTORY:   Family Status  Relation Status  . MGF Deceased at age 2  . Mother Deceased  emphysema, HTN  . Father Deceased       alzheimer's  . Brother Deceased       drown  . MGM (Not Specified)  . Neg Hx (Not Specified)    ROS:  A complete 10 system review of systems was obtained and was unremarkable apart from what is mentioned above.  PHYSICAL EXAMINATION:    VITALS:   There were no vitals filed for this visit.  GEN:  The patient appears stated age and is in NAD. HEENT:   Normocephalic, atraumatic.  The mucous membranes are moist. The superficial temporal arteries are without ropiness or tenderness. CV:  RRR Lungs:  CTAB Neck/HEME:  There are no carotid bruits bilaterally.  Neurological examination:  Orientation: The patient is alert and oriented x3.  Cranial nerves: There is good facial symmetry. There is facial hypomimia. The visual fields are full to confrontational testing. The speech is fluent and clear but hypophonic. Soft palate rises symmetrically and there is no tongue deviation. Hearing is intact to conversational tone. Sensation: Sensation is intact to light touch throughout Motor: Strength is 5/5 in the bilateral upper and lower extremities.   Shoulder shrug is equal and symmetric.  There is no pronator drift.   Movement examination: Tone: There is normal tone in the bilateral upper extremities.  The tone in the lower extremities is  normal.  Abnormal movements:  She has a L hand rest tremor that is mild Coordination:  There is some difficulty with hand opening and closing bilaterally and finger taps more on the L. Gait and Station: The patient has minimal difficulty arising out of a deep-seated chair without the use of the hands.  She is short stepped.  No left hand tremor today and slightly drags the L leg with ambulation (states had polio as a child)  LABS  Lab Results  Component Value Date   TSH 3.40 03/04/2017     Chemistry      Component Value Date/Time   NA 142 06/13/2017 0700   K 3.4 (L) 06/13/2017 0700   CL 105 06/13/2017 0700   CO2 26 03/04/2017 1508   BUN 27 (H) 06/13/2017 0700   CREATININE 0.80 06/13/2017 0700      Component Value Date/Time   CALCIUM 9.4 03/04/2017 1508   ALKPHOS 80 03/04/2017 1508   AST 25 03/04/2017 1508   ALT 5 03/04/2017 1508   BILITOT 0.9 03/04/2017 1508     Lab Results  Component Value Date   WBC 7.8 06/13/2017   HGB 10.9 (L) 06/13/2017   HCT 32.0 (L) 06/13/2017   MCV 85.3 06/13/2017   PLT  163 06/13/2017   Lab Results  Component Value Date   IRON 61 09/18/2016   TIBC 281 09/18/2016   FERRITIN 98 09/18/2016     Chemistry      Component Value Date/Time   NA 142 06/13/2017 0700   K 3.4 (L) 06/13/2017 0700   CL 105 06/13/2017 0700   CO2 26 03/04/2017 1508   BUN 27 (H) 06/13/2017 0700   CREATININE 0.80 06/13/2017 0700      Component Value Date/Time   CALCIUM 9.4 03/04/2017 1508   ALKPHOS 80 03/04/2017 1508   AST 25 03/04/2017 1508   ALT 5 03/04/2017 1508   BILITOT 0.9 03/04/2017 1508       ASSESSMENT/PLAN:  1.  Parkinsons disease  -***Continue Rytary, 145 mg 3 times per day.  -***She has had several falls since last visit.  Talked about the importance  of using the walker at all times.  -***Continue home physical therapy. 2.  Restless leg syndrome  -Is much better with mirapex, 0.125 mg bid.  No evidence of cognitive change.    -wonder if anemia contributing.  Will recheck and recheck iron stores 3.  Degenerative changes in the cervical region  -has Attended physical therapy. 4.  Follow up is anticipated in the next 3 months, sooner should new issues arise.  Much greater than 50% of this visit was spent in counseling and coordinating care.  Total face to face time:  25 min

## 2017-07-14 ENCOUNTER — Ambulatory Visit: Payer: Medicare Other | Admitting: Neurology

## 2017-07-15 DIAGNOSIS — M1991 Primary osteoarthritis, unspecified site: Secondary | ICD-10-CM | POA: Diagnosis not present

## 2017-07-15 DIAGNOSIS — G20 Parkinson's disease: Secondary | ICD-10-CM | POA: Diagnosis not present

## 2017-07-15 DIAGNOSIS — M542 Cervicalgia: Secondary | ICD-10-CM | POA: Diagnosis not present

## 2017-07-15 DIAGNOSIS — N390 Urinary tract infection, site not specified: Secondary | ICD-10-CM | POA: Diagnosis not present

## 2017-07-20 ENCOUNTER — Encounter: Payer: Self-pay | Admitting: Neurology

## 2017-07-29 ENCOUNTER — Other Ambulatory Visit: Payer: Self-pay | Admitting: Internal Medicine

## 2017-09-08 DIAGNOSIS — B070 Plantar wart: Secondary | ICD-10-CM | POA: Diagnosis not present

## 2017-09-08 DIAGNOSIS — M4003 Postural kyphosis, cervicothoracic region: Secondary | ICD-10-CM | POA: Diagnosis not present

## 2017-09-08 DIAGNOSIS — R0781 Pleurodynia: Secondary | ICD-10-CM | POA: Diagnosis not present

## 2017-09-08 DIAGNOSIS — M1712 Unilateral primary osteoarthritis, left knee: Secondary | ICD-10-CM | POA: Diagnosis not present

## 2017-09-09 ENCOUNTER — Ambulatory Visit: Payer: Medicare Other | Admitting: Internal Medicine

## 2017-09-11 ENCOUNTER — Inpatient Hospital Stay (HOSPITAL_COMMUNITY)
Admission: EM | Admit: 2017-09-11 | Discharge: 2017-09-14 | DRG: 605 | Disposition: A | Discharge status: Skilled Nursing Facility | Payer: Medicare Other | Attending: Nephrology | Admitting: Nephrology

## 2017-09-11 ENCOUNTER — Emergency Department (HOSPITAL_COMMUNITY): Payer: Medicare Other

## 2017-09-11 ENCOUNTER — Encounter (HOSPITAL_COMMUNITY): Payer: Self-pay | Admitting: Emergency Medicine

## 2017-09-11 ENCOUNTER — Telehealth: Payer: Self-pay | Admitting: Internal Medicine

## 2017-09-11 DIAGNOSIS — Z825 Family history of asthma and other chronic lower respiratory diseases: Secondary | ICD-10-CM

## 2017-09-11 DIAGNOSIS — S098XXA Other specified injuries of head, initial encounter: Secondary | ICD-10-CM | POA: Diagnosis not present

## 2017-09-11 DIAGNOSIS — G20 Parkinson's disease: Secondary | ICD-10-CM | POA: Diagnosis not present

## 2017-09-11 DIAGNOSIS — Z8249 Family history of ischemic heart disease and other diseases of the circulatory system: Secondary | ICD-10-CM | POA: Diagnosis not present

## 2017-09-11 DIAGNOSIS — Z8673 Personal history of transient ischemic attack (TIA), and cerebral infarction without residual deficits: Secondary | ICD-10-CM

## 2017-09-11 DIAGNOSIS — N3000 Acute cystitis without hematuria: Secondary | ICD-10-CM | POA: Diagnosis not present

## 2017-09-11 DIAGNOSIS — Z23 Encounter for immunization: Secondary | ICD-10-CM

## 2017-09-11 DIAGNOSIS — Z803 Family history of malignant neoplasm of breast: Secondary | ICD-10-CM | POA: Diagnosis not present

## 2017-09-11 DIAGNOSIS — W19XXXA Unspecified fall, initial encounter: Secondary | ICD-10-CM

## 2017-09-11 DIAGNOSIS — G2581 Restless legs syndrome: Secondary | ICD-10-CM | POA: Diagnosis present

## 2017-09-11 DIAGNOSIS — E538 Deficiency of other specified B group vitamins: Secondary | ICD-10-CM | POA: Diagnosis present

## 2017-09-11 DIAGNOSIS — G960 Cerebrospinal fluid leak: Secondary | ICD-10-CM | POA: Diagnosis present

## 2017-09-11 DIAGNOSIS — G20A1 Parkinson's disease without dyskinesia, without mention of fluctuations: Secondary | ICD-10-CM | POA: Diagnosis present

## 2017-09-11 DIAGNOSIS — M8580 Other specified disorders of bone density and structure, unspecified site: Secondary | ICD-10-CM | POA: Diagnosis present

## 2017-09-11 DIAGNOSIS — K589 Irritable bowel syndrome without diarrhea: Secondary | ICD-10-CM | POA: Diagnosis present

## 2017-09-11 DIAGNOSIS — Z820 Family history of epilepsy and other diseases of the nervous system: Secondary | ICD-10-CM | POA: Diagnosis not present

## 2017-09-11 DIAGNOSIS — E785 Hyperlipidemia, unspecified: Secondary | ICD-10-CM | POA: Diagnosis present

## 2017-09-11 DIAGNOSIS — S0191XA Laceration without foreign body of unspecified part of head, initial encounter: Secondary | ICD-10-CM | POA: Diagnosis present

## 2017-09-11 DIAGNOSIS — G9608 Other cranial cerebrospinal fluid leak: Secondary | ICD-10-CM | POA: Diagnosis present

## 2017-09-11 DIAGNOSIS — G14 Postpolio syndrome: Secondary | ICD-10-CM | POA: Diagnosis present

## 2017-09-11 DIAGNOSIS — E039 Hypothyroidism, unspecified: Secondary | ICD-10-CM | POA: Diagnosis not present

## 2017-09-11 DIAGNOSIS — S0990XA Unspecified injury of head, initial encounter: Secondary | ICD-10-CM | POA: Diagnosis not present

## 2017-09-11 DIAGNOSIS — Z7982 Long term (current) use of aspirin: Secondary | ICD-10-CM | POA: Diagnosis not present

## 2017-09-11 DIAGNOSIS — N390 Urinary tract infection, site not specified: Secondary | ICD-10-CM | POA: Diagnosis present

## 2017-09-11 DIAGNOSIS — R51 Headache: Secondary | ICD-10-CM | POA: Diagnosis not present

## 2017-09-11 DIAGNOSIS — S0181XA Laceration without foreign body of other part of head, initial encounter: Secondary | ICD-10-CM | POA: Diagnosis not present

## 2017-09-11 DIAGNOSIS — M542 Cervicalgia: Secondary | ICD-10-CM | POA: Diagnosis not present

## 2017-09-11 DIAGNOSIS — Z888 Allergy status to other drugs, medicaments and biological substances status: Secondary | ICD-10-CM

## 2017-09-11 HISTORY — DX: Other cranial cerebrospinal fluid leak: G96.08

## 2017-09-11 HISTORY — DX: Unspecified fall, initial encounter: W19.XXXA

## 2017-09-11 LAB — URINALYSIS, ROUTINE W REFLEX MICROSCOPIC
Bilirubin Urine: NEGATIVE
GLUCOSE, UA: NEGATIVE mg/dL
Ketones, ur: 20 mg/dL — AB
NITRITE: POSITIVE — AB
PH: 5 (ref 5.0–8.0)
Protein, ur: NEGATIVE mg/dL
Specific Gravity, Urine: 1.018 (ref 1.005–1.030)

## 2017-09-11 LAB — COMPREHENSIVE METABOLIC PANEL
ALT: 54 U/L (ref 14–54)
AST: 53 U/L — AB (ref 15–41)
Albumin: 2.8 g/dL — ABNORMAL LOW (ref 3.5–5.0)
Alkaline Phosphatase: 111 U/L (ref 38–126)
Anion gap: 10 (ref 5–15)
BILIRUBIN TOTAL: 1.4 mg/dL — AB (ref 0.3–1.2)
BUN: 14 mg/dL (ref 6–20)
CHLORIDE: 104 mmol/L (ref 101–111)
CO2: 26 mmol/L (ref 22–32)
Calcium: 8.3 mg/dL — ABNORMAL LOW (ref 8.9–10.3)
Creatinine, Ser: 0.65 mg/dL (ref 0.44–1.00)
Glucose, Bld: 87 mg/dL (ref 65–99)
POTASSIUM: 2.9 mmol/L — AB (ref 3.5–5.1)
Sodium: 140 mmol/L (ref 135–145)
TOTAL PROTEIN: 5.6 g/dL — AB (ref 6.5–8.1)

## 2017-09-11 LAB — CBC WITH DIFFERENTIAL/PLATELET
BASOS ABS: 0 10*3/uL (ref 0.0–0.1)
Basophils Relative: 0 %
EOS PCT: 2 %
Eosinophils Absolute: 0.1 10*3/uL (ref 0.0–0.7)
HEMATOCRIT: 34.6 % — AB (ref 36.0–46.0)
Hemoglobin: 11.6 g/dL — ABNORMAL LOW (ref 12.0–15.0)
LYMPHS ABS: 1.2 10*3/uL (ref 0.7–4.0)
LYMPHS PCT: 21 %
MCH: 28.1 pg (ref 26.0–34.0)
MCHC: 33.5 g/dL (ref 30.0–36.0)
MCV: 83.8 fL (ref 78.0–100.0)
MONO ABS: 0.6 10*3/uL (ref 0.1–1.0)
MONOS PCT: 11 %
NEUTROS ABS: 3.8 10*3/uL (ref 1.7–7.7)
Neutrophils Relative %: 66 %
PLATELETS: 229 10*3/uL (ref 150–400)
RBC: 4.13 MIL/uL (ref 3.87–5.11)
RDW: 15.7 % — AB (ref 11.5–15.5)
WBC: 5.7 10*3/uL (ref 4.0–10.5)

## 2017-09-11 LAB — I-STAT TROPONIN, ED: Troponin i, poc: 0.01 ng/mL (ref 0.00–0.08)

## 2017-09-11 LAB — MAGNESIUM: MAGNESIUM: 1.5 mg/dL — AB (ref 1.7–2.4)

## 2017-09-11 LAB — TSH: TSH: 7.416 u[IU]/mL — AB (ref 0.350–4.500)

## 2017-09-11 MED ORDER — ONDANSETRON HCL 4 MG PO TABS: 4.0000 mg | ORAL_TABLET | Freq: Four times a day (QID) | ORAL | Status: DC | PRN

## 2017-09-11 MED ORDER — LIDOCAINE-EPINEPHRINE-TETRACAINE (LET) SOLUTION
3.0000 mL | Freq: Once | NASAL | Status: AC
  Administered 2017-09-11: 18:00:00 3 mL via TOPICAL
  Filled 2017-09-11: qty 3

## 2017-09-11 MED ORDER — ZOLPIDEM TARTRATE 5 MG PO TABS
5.0000 mg | ORAL_TABLET | Freq: Every evening | ORAL | Status: DC | PRN
  Administered 2017-09-12 – 2017-09-13 (×2): 5 mg via ORAL
  Filled 2017-09-11 (×2): qty 1

## 2017-09-11 MED ORDER — DEXTROSE 5 % IV SOLN
1.0000 g | INTRAVENOUS | Status: DC
  Administered 2017-09-12 – 2017-09-13 (×2): 1 g via INTRAVENOUS
  Filled 2017-09-11 (×2): qty 10

## 2017-09-11 MED ORDER — POTASSIUM CHLORIDE 10 MEQ/100ML IV SOLN
10.0000 meq | INTRAVENOUS | Status: AC
  Administered 2017-09-11 (×2): 10 meq via INTRAVENOUS
  Filled 2017-09-11 (×2): qty 100

## 2017-09-11 MED ORDER — SODIUM CHLORIDE 0.9 % IV BOLUS (SEPSIS)
500.0000 mL | Freq: Once | INTRAVENOUS | Status: AC
  Administered 2017-09-11: 500 mL via INTRAVENOUS

## 2017-09-11 MED ORDER — LIDOCAINE-EPINEPHRINE (PF) 2 %-1:200000 IJ SOLN
10.0000 mL | Freq: Once | INTRAMUSCULAR | Status: AC
  Administered 2017-09-11: 10 mL
  Filled 2017-09-11: qty 20

## 2017-09-11 MED ORDER — VITAMIN B-12 1000 MCG PO TABS
1000.0000 ug | ORAL_TABLET | Freq: Every day | ORAL | Status: DC
  Administered 2017-09-12 – 2017-09-14 (×3): 1000 ug via ORAL
  Filled 2017-09-11 (×3): qty 1

## 2017-09-11 MED ORDER — ASPIRIN EC 81 MG PO TBEC
81.0000 mg | DELAYED_RELEASE_TABLET | Freq: Every day | ORAL | Status: DC
  Administered 2017-09-11 – 2017-09-14 (×4): 81 mg via ORAL
  Filled 2017-09-11 (×4): qty 1

## 2017-09-11 MED ORDER — PRAMIPEXOLE DIHYDROCHLORIDE 0.125 MG PO TABS
0.1250 mg | ORAL_TABLET | Freq: Two times a day (BID) | ORAL | Status: DC
  Administered 2017-09-11 – 2017-09-14 (×5): 0.125 mg via ORAL
  Filled 2017-09-11 (×7): qty 1

## 2017-09-11 MED ORDER — VITAMIN D3 25 MCG (1000 UNIT) PO TABS
1000.0000 [IU] | ORAL_TABLET | Freq: Every day | ORAL | Status: DC
  Administered 2017-09-12 – 2017-09-14 (×3): 1000 [IU] via ORAL
  Filled 2017-09-11 (×3): qty 1

## 2017-09-11 MED ORDER — ONDANSETRON HCL 4 MG/2ML IJ SOLN: 4.0000 mg | Freq: Four times a day (QID) | INTRAMUSCULAR | Status: DC | PRN

## 2017-09-11 MED ORDER — POTASSIUM CHLORIDE CRYS ER 20 MEQ PO TBCR
40.0000 meq | EXTENDED_RELEASE_TABLET | Freq: Once | ORAL | Status: AC
  Administered 2017-09-11: 40 meq via ORAL
  Filled 2017-09-11: qty 2

## 2017-09-11 MED ORDER — TETANUS-DIPHTH-ACELL PERTUSSIS 5-2.5-18.5 LF-MCG/0.5 IM SUSP
0.5000 mL | Freq: Once | INTRAMUSCULAR | Status: AC
  Administered 2017-09-11: 0.5 mL via INTRAMUSCULAR
  Filled 2017-09-11: qty 0.5

## 2017-09-11 MED ORDER — MAGNESIUM SULFATE IN D5W 1-5 GM/100ML-% IV SOLN
1.0000 g | Freq: Once | INTRAVENOUS | Status: AC
  Administered 2017-09-12: 1 g via INTRAVENOUS
  Filled 2017-09-11: qty 100

## 2017-09-11 MED ORDER — CALCIUM CARBONATE-VITAMIN D 500-200 MG-UNIT PO TABS
1.0000 | ORAL_TABLET | Freq: Every day | ORAL | Status: DC
  Administered 2017-09-12 – 2017-09-14 (×3): 1 via ORAL
  Filled 2017-09-11 (×3): qty 1

## 2017-09-11 MED ORDER — ACETAMINOPHEN 325 MG PO TABS: 650.0000 mg | ORAL_TABLET | Freq: Four times a day (QID) | ORAL | Status: DC | PRN

## 2017-09-11 MED ORDER — ACETAMINOPHEN 650 MG RE SUPP: 650.0000 mg | Freq: Four times a day (QID) | RECTAL | Status: DC | PRN

## 2017-09-11 MED ORDER — LEVOTHYROXINE SODIUM 75 MCG PO TABS
37.5000 ug | ORAL_TABLET | Freq: Every day | ORAL | Status: DC
  Administered 2017-09-12 – 2017-09-14 (×3): 37.5 ug via ORAL
  Filled 2017-09-11: qty 2
  Filled 2017-09-11: qty 0.5
  Filled 2017-09-11: qty 2
  Filled 2017-09-11: qty 0.5
  Filled 2017-09-11: qty 2
  Filled 2017-09-11: qty 0.5

## 2017-09-11 MED ORDER — DEXTROSE 5 % IV SOLN
1.0000 g | Freq: Once | INTRAVENOUS | Status: AC
  Administered 2017-09-11: 1 g via INTRAVENOUS
  Filled 2017-09-11: qty 10

## 2017-09-11 MED ORDER — SODIUM CHLORIDE 0.9 % IV SOLN
INTRAVENOUS | Status: DC
  Administered 2017-09-11 – 2017-09-13 (×3): via INTRAVENOUS

## 2017-09-11 MED ORDER — SIMETHICONE 80 MG PO CHEW: 120.0000 mg | CHEWABLE_TABLET | Freq: Every day | ORAL | Status: DC | PRN

## 2017-09-11 MED ORDER — FESOTERODINE FUMARATE ER 4 MG PO TB24
4.0000 mg | ORAL_TABLET | Freq: Every day | ORAL | Status: DC
  Administered 2017-09-11 – 2017-09-14 (×4): 4 mg via ORAL
  Filled 2017-09-11 (×4): qty 1

## 2017-09-11 NOTE — Telephone Encounter (Signed)
Does the hospital not order this before discharge?

## 2017-09-11 NOTE — Telephone Encounter (Signed)
This has to be done at the hospital and usually they will do these orders .  For me to order this she needs to be seen.   She should take with the hospital social worker

## 2017-09-11 NOTE — Telephone Encounter (Signed)
Friend of family has called in regard.  I have informed them to get with the hospital social worker to express these concerns.

## 2017-09-11 NOTE — ED Notes (Signed)
Call report to [facility] [phone] at 2020. Thanks.

## 2017-09-11 NOTE — ED Notes (Signed)
ED TO INPATIENT HANDOFF REPORT  Name/Age/Gender Elizabeth [name] 83 y.o. female  Code Status    Code Status Orders        Start     Ordered   09/11/17 1937  Full code  Continuous     09/11/17 1937    Code Status History    Date Active Date Inactive Code Status Order ID Comments User Context   This patient has a current code status but no historical code status.      Home/SNF/Other Home  Chief Complaint Fall  Level of Care/Admitting Diagnosis ED Disposition    ED Disposition Condition Comment   Admit  Hospital Area: [facility] [[id number]]  Level of Care: Telemetry [5]  Admit to tele based on following criteria: Other see comments  Comments: fall  Diagnosis: Fall [290176]  Admitting Physician: Ivor Costa [[id number]]  Attending Physician: Ivor Costa [phone]  Estimated length of stay: past midnight tomorrow  Certification:: I certify this patient will need inpatient services for at least 2 midnights  PT Class (Do Not Modify): Inpatient [101]  PT Acc Code (Do Not Modify): Private [1]       Medical History Past Medical History:  Diagnosis Date  . Abnormal Pap smear of vagina 09/2006   Dr Nori Riis  . Hyperlipidemia   . Hypothyroidism   . Osteopenia    PMH fracture heel, wrist  . Pernicious anemia   . Post-polio syndrome    minor problems L leg  . Vitamin B12 deficiency     Allergies Allergies  Allergen Reactions  . Atorvastatin     REACTION: Felt funny (only way patient could describe)  . Crestor [Rosuvastatin Calcium]     Leg cramps, muscle aches  . Vesicare [Solifenacin]     constipation    IV Location/Drains/Wounds Patient Lines/Drains/Airways Status   Active Line/Drains/Airways    Name:   Placement date:   Placement time:   Site:   Days:   Peripheral IV 09/11/17 Right Forearm  09/11/17    2001    Forearm    less than 1          Labs/Imaging Results for orders placed or performed during the hospital encounter of 09/11/17 (from the  past 48 hour(s))  CBC with Differential/Platelet     Status: Abnormal   Collection Time: 09/11/17  5:38 PM  Result Value Ref Range   WBC 5.7 4.0 - 10.5 K/uL   RBC 4.13 3.87 - 5.11 MIL/uL   Hemoglobin 11.6 (L) 12.0 - 15.0 g/dL   HCT 34.6 (L) 36.0 - 46.0 %   MCV 83.8 78.0 - 100.0 fL   MCH 28.1 26.0 - 34.0 pg   MCHC 33.5 30.0 - 36.0 g/dL   RDW 15.7 (H) 11.5 - 15.5 %   Platelets 229 150 - 400 K/uL   Neutrophils Relative % 66 %   Neutro Abs 3.8 1.7 - 7.7 K/uL   Lymphocytes Relative 21 %   Lymphs Abs 1.2 0.7 - 4.0 K/uL   Monocytes Relative 11 %   Monocytes Absolute 0.6 0.1 - 1.0 K/uL   Eosinophils Relative 2 %   Eosinophils Absolute 0.1 0.0 - 0.7 K/uL   Basophils Relative 0 %   Basophils Absolute 0.0 0.0 - 0.1 K/uL  Comprehensive metabolic panel     Status: Abnormal   Collection Time: 09/11/17  5:38 PM  Result Value Ref Range   Sodium 140 135 - 145 mmol/L   Potassium 2.9 (L) 3.5 -  5.1 mmol/L   Chloride 104 101 - 111 mmol/L   CO2 26 22 - 32 mmol/L   Glucose, Bld 87 65 - 99 mg/dL   BUN 14 6 - 20 mg/dL   Creatinine, Ser 0.65 0.44 - 1.00 mg/dL   Calcium 8.3 (L) 8.9 - 10.3 mg/dL   Total Protein 5.6 (L) 6.5 - 8.1 g/dL   Albumin 2.8 (L) 3.5 - 5.0 g/dL   AST 53 (H) 15 - 41 U/L   ALT 54 14 - 54 U/L   Alkaline Phosphatase 111 38 - 126 U/L   Total Bilirubin 1.4 (H) 0.3 - 1.2 mg/dL   GFR calc non Af Amer >60 >60 mL/min   GFR calc Af Amer >60 >60 mL/min    Comment: (NOTE) The eGFR has been calculated using the CKD EPI equation. This calculation has not been validated in all clinical situations. eGFR's persistently <60 mL/min signify possible Chronic Kidney Disease.    Anion gap 10 5 - 15  TSH     Status: Abnormal   Collection Time: 09/11/17  5:38 PM  Result Value Ref Range   TSH 7.416 (H) 0.350 - 4.500 uIU/mL    Comment: Performed by a 3rd Generation assay with a functional sensitivity of <=0.01 uIU/mL.  Urinalysis, Routine w reflex microscopic     Status: Abnormal   Collection  Time: 09/11/17  5:49 PM  Result Value Ref Range   Color, Urine YELLOW YELLOW   APPearance HAZY (A) CLEAR   Specific Gravity, Urine 1.018 1.005 - 1.030   pH 5.0 5.0 - 8.0   Glucose, UA NEGATIVE NEGATIVE mg/dL   Hgb urine dipstick SMALL (A) NEGATIVE   Bilirubin Urine NEGATIVE NEGATIVE   Ketones, ur 20 (A) NEGATIVE mg/dL   Protein, ur NEGATIVE NEGATIVE mg/dL   Nitrite POSITIVE (A) NEGATIVE   Leukocytes, UA TRACE (A) NEGATIVE   RBC / HPF 0-5 0 - 5 RBC/hpf   WBC, UA 6-30 0 - 5 WBC/hpf   Bacteria, UA MANY (A) NONE SEEN   Squamous Epithelial / LPF 0-5 (A) NONE SEEN   Mucus PRESENT   I-stat troponin, ED     Status: None   Collection Time: 09/11/17  5:53 PM  Result Value Ref Range   Troponin i, poc 0.01 0.00 - 0.08 ng/mL   Comment 3            Comment: Due to the release kinetics of cTnI, a negative result within the first hours of the onset of symptoms does not rule out myocardial infarction with certainty. If myocardial infarction is still suspected, repeat the test at appropriate intervals.    Ct Head Wo Contrast  Result Date: 09/11/2017 CLINICAL DATA:  Headache and neck pain EXAM: CT HEAD WITHOUT CONTRAST CT CERVICAL SPINE WITHOUT CONTRAST TECHNIQUE: Multidetector CT imaging of the head and cervical spine was performed following the standard protocol without intravenous contrast. Multiplanar CT image reconstructions of the cervical spine were also generated. COMPARISON:  Head CT November 24, 2014; cervical spine CT May 21, 2012; cervical MRI Mar 27, 2016 FINDINGS: CT HEAD FINDINGS Brain: In comparison with the prior study from 2016, there is a small subdural hygroma on the left which involves frontal, superior temporal, and left parietal lobes. The maximum thickness of this hygroma is noted in the left parietal region measuring 4 mm. There is modest mass effect on the left supratentorial structures without midline shift. No acute appearing hemorrhage is seen intraaxially or extraaxially on  this study. There  is mild underlying atrophy. There is no intracranial mass. There is patchy small vessel disease throughout the centra semiovale bilaterally. There are lacunar type infarcts in the head of the caudate nucleus as well as in the head of the caudate nucleus on the left. There is also prior infarct in the anterior, medial globus pallidum on the left with involvement of portions of the anterior lentiform nucleus as well as anterior limbs of the internal and external capsules on the left. Basal ganglia calcification is felt to be physiologic in this age group. There is evidence of a prior infarct in the inferior right occipital lobe, stable. No acute infarct is evident on this study. Vascular: There is no appreciable hyperdense vessel. There is calcification in each carotid siphon region. There is also calcification in each distal vertebral artery. Skull: The bony calvarium appears intact. Sinuses/Orbits: There is mild mucosal thickening in several ethmoid air cells bilaterally. There is mild mucosal thickening in the posteromedial left maxillary antrum. Other visualized paranasal sinuses are clear. Orbits appear symmetric bilaterally. Other: Mastoid air cells are clear. CT CERVICAL SPINE FINDINGS Alignment: There is 3 mm of anterolisthesis of C7 on T1, stable. No new spondylolisthesis. Skull base and vertebrae: Skull base and craniocervical junction regions appear normal. There is no evident fracture. No blastic or lytic bone lesions. Bones appear somewhat osteoporotic. Soft tissues and spinal canal: Prevertebral soft tissues and predental space regions are normal. No paraspinous lesions. No cord or canal hematoma appreciable. Disc levels: There is moderately severe disc space narrowing at C5-6 and C6-7. There is milder narrowing at C3-4, C4-5, and C7-T1. There is multilevel facet osteoarthritic change. Exit foraminal narrowing is most notable on the left at C3-4 due to bony hypertrophy. There is no  frank disc extrusion or stenosis. Upper chest: There is scarring in the lung apices, more on the right than on the left, stable. Other: There is atherosclerotic calcification in the carotid arteries. IMPRESSION: CT head: 1. Small left sided subdural hygroma with maximum thickness of the hygroma measured at 4 mm. There is modest mass effect on the left supratentorial structures, but there is no midline shift. No acute hemorrhage evident. 2. There is atrophy with prior right occipital infarct. There are prior infarcts in the left basal ganglia anteriorly as well as in the head of the caudate nucleus on the right. No acute infarct evident. 3.  Foci of arterial vascular calcification noted. 4.  Mucosal thickening in ethmoid air cells. CT cervical spine: No fracture. Stable mild spondylolisthesis at C7 on T1, felt to be due to underlying spondylosis. There is multilevel osteoarthritic change. There are foci of calcification in each carotid artery. Critical Value/emergent results were called by telephone at the time of interpretation on 09/11/2017 at 1:50 pm to Dr. Milton Ferguson , who verbally acknowledged these results. Electronically Signed   By: Lowella Grip III M.D.   On: 09/11/2017 13:50   Ct Cervical Spine Wo Contrast  Result Date: 09/11/2017 CLINICAL DATA:  Headache and neck pain EXAM: CT HEAD WITHOUT CONTRAST CT CERVICAL SPINE WITHOUT CONTRAST TECHNIQUE: Multidetector CT imaging of the head and cervical spine was performed following the standard protocol without intravenous contrast. Multiplanar CT image reconstructions of the cervical spine were also generated. COMPARISON:  Head CT November 24, 2014; cervical spine CT May 21, 2012; cervical MRI Mar 27, 2016 FINDINGS: CT HEAD FINDINGS Brain: In comparison with the prior study from 2016, there is a small subdural hygroma on the left which involves frontal,  superior temporal, and left parietal lobes. The maximum thickness of this hygroma is noted in the left  parietal region measuring 4 mm. There is modest mass effect on the left supratentorial structures without midline shift. No acute appearing hemorrhage is seen intraaxially or extraaxially on this study. There is mild underlying atrophy. There is no intracranial mass. There is patchy small vessel disease throughout the centra semiovale bilaterally. There are lacunar type infarcts in the head of the caudate nucleus as well as in the head of the caudate nucleus on the left. There is also prior infarct in the anterior, medial globus pallidum on the left with involvement of portions of the anterior lentiform nucleus as well as anterior limbs of the internal and external capsules on the left. Basal ganglia calcification is felt to be physiologic in this age group. There is evidence of a prior infarct in the inferior right occipital lobe, stable. No acute infarct is evident on this study. Vascular: There is no appreciable hyperdense vessel. There is calcification in each carotid siphon region. There is also calcification in each distal vertebral artery. Skull: The bony calvarium appears intact. Sinuses/Orbits: There is mild mucosal thickening in several ethmoid air cells bilaterally. There is mild mucosal thickening in the posteromedial left maxillary antrum. Other visualized paranasal sinuses are clear. Orbits appear symmetric bilaterally. Other: Mastoid air cells are clear. CT CERVICAL SPINE FINDINGS Alignment: There is 3 mm of anterolisthesis of C7 on T1, stable. No new spondylolisthesis. Skull base and vertebrae: Skull base and craniocervical junction regions appear normal. There is no evident fracture. No blastic or lytic bone lesions. Bones appear somewhat osteoporotic. Soft tissues and spinal canal: Prevertebral soft tissues and predental space regions are normal. No paraspinous lesions. No cord or canal hematoma appreciable. Disc levels: There is moderately severe disc space narrowing at C5-6 and C6-7. There is  milder narrowing at C3-4, C4-5, and C7-T1. There is multilevel facet osteoarthritic change. Exit foraminal narrowing is most notable on the left at C3-4 due to bony hypertrophy. There is no frank disc extrusion or stenosis. Upper chest: There is scarring in the lung apices, more on the right than on the left, stable. Other: There is atherosclerotic calcification in the carotid arteries. IMPRESSION: CT head: 1. Small left sided subdural hygroma with maximum thickness of the hygroma measured at 4 mm. There is modest mass effect on the left supratentorial structures, but there is no midline shift. No acute hemorrhage evident. 2. There is atrophy with prior right occipital infarct. There are prior infarcts in the left basal ganglia anteriorly as well as in the head of the caudate nucleus on the right. No acute infarct evident. 3.  Foci of arterial vascular calcification noted. 4.  Mucosal thickening in ethmoid air cells. CT cervical spine: No fracture. Stable mild spondylolisthesis at C7 on T1, felt to be due to underlying spondylosis. There is multilevel osteoarthritic change. There are foci of calcification in each carotid artery. Critical Value/emergent results were called by telephone at the time of interpretation on 09/11/2017 at 1:50 pm to Dr. Milton Ferguson , who verbally acknowledged these results. Electronically Signed   By: Lowella Grip III M.D.   On: 09/11/2017 13:50    Pending Labs [employer]    Start     Ordered   09/12/17 0981  Basic metabolic panel  Tomorrow morning,   R     09/11/17 1937   09/12/17 0500  CBC  Tomorrow morning,   R  09/11/17 1937   09/11/17 1934  Magnesium  Once,   R     09/11/17 1935   09/11/17 1857  Urine culture  STAT,   STAT     09/11/17 1857   09/11/17 1857  Blood culture (routine x 2)  BLOOD CULTURE X 2,   STAT     09/11/17 1857      Vitals/Pain Today's Vitals   09/11/17 0939  BP: (!) 120/55  Pulse: 70  Resp: 16  Temp: (!) 97.4 F (36.3 C)   TempSrc: Oral  SpO2: 100%    Isolation Precautions No active isolations  Medications Medications  Tdap (BOOSTRIX) injection 0.5 mL (not administered)  cefTRIAXone (ROCEPHIN) 1 g in dextrose 5 % 50 mL IVPB (not administered)  potassium chloride SA (K-DUR,KLOR-CON) CR tablet 40 mEq (not administered)  levothyroxine (SYNTHROID, LEVOTHROID) tablet 37.5 mcg (not administered)  Simethicone CAPS 125 mg (not administered)  pramipexole (MIRAPEX) tablet 0.125 mg (not administered)  fesoterodine (TOVIAZ) tablet 4 mg (not administered)  vitamin B-12 (CYANOCOBALAMIN) tablet 1,000 mcg (not administered)  calcium citrate-vitamin D (CITRACAL+D) 315-200 MG-UNIT per tablet 1 tablet (not administered)  aspirin tablet 81 mg (not administered)  cholecalciferol (VITAMIN D) tablet 1,000 Units (not administered)  potassium chloride 10 mEq in 100 mL IVPB (not administered)  magnesium sulfate IVPB 1 g 100 mL (not administered)  0.9 %  sodium chloride infusion (not administered)  acetaminophen (TYLENOL) tablet 650 mg (not administered)    Or  acetaminophen (TYLENOL) suppository 650 mg (not administered)  ondansetron (ZOFRAN) tablet 4 mg (not administered)    Or  ondansetron (ZOFRAN) injection 4 mg (not administered)  zolpidem (AMBIEN) tablet 5 mg (not administered)  lidocaine-EPINEPHrine-tetracaine (LET) solution (3 mLs Topical Given 09/11/17 1740)  lidocaine-EPINEPHrine (XYLOCAINE W/EPI) 2 %-1:200000 (PF) injection 10 mL (10 mLs Infiltration Given 09/11/17 1740)  sodium chloride 0.9 % bolus 500 mL (0 mLs Intravenous Stopped 09/11/17 2014)    Mobility Ambulatory with assistance only

## 2017-09-11 NOTE — Discharge Instructions (Signed)
Follow-up in 5-7 days to have sutures removed.   Clean laceration gently with soap and water twice a day

## 2017-09-11 NOTE — H&P (Signed)
History and Physical    Elizabeth [name] RSW:[id number] DOB: August 08, 1931 DOA: 09/11/2017  Referring MD/NP/PA:   PCP: Binnie Rail, MD   Patient coming from:  The patient is coming from home.  At baseline, pt is partially dependent for most of ADL.   Chief Complaint: fall and increased urinary frequency  HPI: Elizabeth [name] is a 83 y.o. female with medical history significant of stroke, hypothyroidism, hyperlipidemia, Parkinson's disease, pernicious anemia, RLS, post polio syndrome, who presents with fall, increased urinary frequency.  Patient states that he recently she has generalized weakness and frequent fall. She has had at least 6 falls in the past 2 weeks. She did not lose consciousness. She has poor balance, but no unilateral weakness, numbness or tingling to extremities. No facial droop, slurred speech or hearing loss. She also reports increased urinary frequency, no dysuria or burning on urination. No nausea, vomiting, diarrhea, abdominal pain. Denies chest pain, shortness of breath, cough, fever or chills. She has skin laceration in the right forehead.  ED Course: pt was found to have WBC 5.7, negative troponin, positive urinalysis with positive nitrite and trace amount of leukocytes, potassium 2.9, creatinine normal, no fever, no tachycardia, no tachypnea, oxygen saturation 100% on room air. Patient is admitted to telemetry bed as inpatient (likely need SNF placement).  CT-head and C spin showed: 1. Small left sided subdural hygroma with maximum thickness of the hygroma measured at 4 mm. There is modest mass effect on the left supratentorial structures, but there is no midline shift. No acute hemorrhage evident. 2. There is atrophy with prior right occipital infarct. There are prior infarcts in the left basal ganglia anteriorly as well as in the head of the caudate nucleus on the right. No acute infarct evident. 3.  Foci of arterial vascular calcification noted. 4.  Mucosal thickening  in ethmoid air cells. 5. CT cervical spine: No fracture. Stable mild spondylolisthesis at C7 on T1, felt to be due to underlying spondylosis. There is multilevel osteoarthritic change. There are foci of calcification in each carotid artery.  Review of Systems:   General: no fevers, chills, no body weight gain,  has fatigue HEENT: no blurry vision, hearing changes or sore throat Respiratory: no dyspnea, coughing, wheezing CV: no chest pain, no palpitations GI: no nausea, vomiting, abdominal pain, diarrhea, constipation GU: no dysuria, burning on urination, increased urinary frequency, hematuria  Ext: no leg edema Neuro: no unilateral weakness, numbness, or tingling, no vision change or hearing loss. Has poor balance, fall Skin: no rash, has skin tear in R forehead. MSK: No muscle spasm, no deformity, no limitation of range of movement in spin Heme: No easy bruising.  Travel history: No recent long distant travel.  Allergy:  Allergies  Allergen Reactions  . Atorvastatin     REACTION: Felt funny (only way patient could describe)  . Crestor [Rosuvastatin Calcium]     Leg cramps, muscle aches  . Vesicare [Solifenacin]     constipation    Past Medical History:  Diagnosis Date  . Abnormal Pap smear of vagina 09/2006   Dr Nori Riis  . Hyperlipidemia   . Hypothyroidism   . Osteopenia    PMH fracture heel, wrist  . Pernicious anemia   . Post-polio syndrome    minor problems L leg  . Vitamin B12 deficiency     Past Surgical History:  Procedure Laterality Date  . BLADDER SUSPENSION  2000  . no colonoscopy     "I never felt I  needed one "    Social History:  reports that she has never smoked. She has never used smokeless tobacco. She reports that she drinks alcohol. She reports that she does not use drugs.  Family History:  Family History  Problem Relation Age of Onset  . Heart attack Maternal Grandfather        70s  . Emphysema Mother   . Hypertension Mother   . Alzheimer's  disease Father   . Breast cancer Maternal Grandmother   . Cancer Maternal Grandmother        breast  . Diabetes Neg Hx   . Stroke Neg Hx   . Parkinson's disease Neg Hx      Prior to Admission medications   Medication Sig Start Date End Date Taking? Authorizing Provider  aspirin 81 MG tablet Take 81 mg by mouth daily.     Yes [provider]  Calcium Carbonate-Vitamin D (CALCIUM + D PO) Take 1 tablet by mouth daily. Reported on 12/25/2015   Yes [provider]  cholecalciferol (VITAMIN D) 1000 UNITS tablet Take 1,000 Units by mouth daily.     Yes [provider]  levothyroxine (SYNTHROID, LEVOTHROID) 25 MCG tablet TAKE 1 TABLET DAILY. 07/29/17  Yes Burns, Claudina Lick, MD  pramipexole (MIRAPEX) 0.125 MG tablet Take 1 tablet (0.125 mg total) by mouth 2 (two) times daily. Patient taking differently: Take 0.125 mg by mouth at bedtime.  09/18/16  Yes Tat, Eustace Quail, DO  Simethicone (GAS-X EXTRA STRENGTH) 125 MG CAPS Take 125 mg by mouth daily as needed (gas).   Yes [provider]  tolterodine (DETROL LA) 4 MG 24 hr capsule Take 4 mg by mouth daily.  09/03/16  Yes [provider]  vitamin B-12 (CYANOCOBALAMIN) 1000 MCG tablet Take 1 tablet (1,000 mcg total) by mouth daily. Patient taking differently: Take 5,000 mcg by mouth daily.  08/06/16  Yes Burns, Claudina Lick, MD  Carbidopa-Levodopa ER (RYTARY) 36.25-145 MG CPCR Take 1 tablet by mouth 3 (three) times daily. Patient not taking: Reported on 09/11/2017 01/08/17   Tat, Eustace Quail, DO  Misc. Devices (ROLLATOR ULTRA-LIGHT) MISC Use as directed 06/29/17   Binnie Rail, MD    Physical Exam: Vitals:   09/11/17 0939 09/11/17 2023 09/11/17 2100  BP: (!) 120/55 121/65 (!) 142/58  Pulse: 70 85 72  Resp: 16 18 18   Temp: (!) 97.4 F (36.3 C)  98.2 F (36.8 C)  TempSrc: Oral  Oral  SpO2: 100% 99% 99%   General: Not in acute distress HEENT:       Eyes: PERRL, EOMI, no scleral icterus.       ENT: No discharge from  the ears and nose, no pharynx injection, no tonsillar enlargement.        Neck: No JVD, no bruit, no mass felt. Heme: No neck lymph node enlargement. Cardiac: S1/S2, RRR, No murmurs, No gallops or rubs. Respiratory:  No rales, wheezing, rhonchi or rubs. GI: Soft, nondistended, nontender, no rebound pain, no organomegaly, BS present. GU: No hematuria Ext: No pitting leg edema bilaterally. 2+DP/PT pulse bilaterally. Musculoskeletal: No joint deformities, No joint redness or warmth, no limitation of ROM in spin. Skin: No rashes. Has skin laceration in right forehead which is sutured up  in ED. Neuro: Alert, oriented X3, cranial nerves II-XII grossly intact, moves all extremities normally.  Psych: Patient is not psychotic, no suicidal or hemocidal ideation.  Labs on Admission: I have personally reviewed following labs and imaging studies  CBC:  Recent Labs Lab 09/11/17 1738  WBC 5.7  NEUTROABS 3.8  HGB 11.6*  HCT 34.6*  MCV 83.8  PLT 299   Basic Metabolic Panel:  Recent Labs Lab 09/11/17 1738 09/11/17 2244  NA 140  --   K 2.9*  --   CL 104  --   CO2 26  --   GLUCOSE 87  --   BUN 14  --   CREATININE 0.65  --   CALCIUM 8.3*  --   MG  --  1.5*   GFR: CrCl cannot be calculated (Unknown ideal weight.). Liver Function Tests:  Recent Labs Lab 09/11/17 1738  AST 53*  ALT 54  ALKPHOS 111  BILITOT 1.4*  PROT 5.6*  ALBUMIN 2.8*   No results for input(s): LIPASE, AMYLASE in the last 168 hours. No results for input(s): AMMONIA in the last 168 hours. Coagulation Profile: No results for input(s): INR, PROTIME in the last 168 hours. Cardiac Enzymes: No results for input(s): CKTOTAL, CKMB, CKMBINDEX, TROPONINI in the last 168 hours. BNP (last 3 results) No results for input(s): PROBNP in the last 8760 hours. HbA1C: No results for input(s): HGBA1C in the last 72 hours. CBG: No results for input(s): GLUCAP in the last 168 hours. Lipid Profile: No results for input(s):  CHOL, HDL, LDLCALC, TRIG, CHOLHDL, LDLDIRECT in the last 72 hours. Thyroid Function Tests:  Recent Labs  09/11/17 1738  TSH 7.416*   Anemia Panel: No results for input(s): VITAMINB12, FOLATE, FERRITIN, TIBC, IRON, RETICCTPCT in the last 72 hours. Urine analysis:    Component Value Date/Time   COLORURINE YELLOW 09/11/2017 1749   APPEARANCEUR HAZY (A) 09/11/2017 1749   LABSPEC 1.018 09/11/2017 1749   PHURINE 5.0 09/11/2017 1749   GLUCOSEU NEGATIVE 09/11/2017 1749   GLUCOSEU NEGATIVE 11/15/2014 1537   HGBUR SMALL (A) 09/11/2017 1749   BILIRUBINUR NEGATIVE 09/11/2017 1749   KETONESUR 20 (A) 09/11/2017 1749   PROTEINUR NEGATIVE 09/11/2017 1749   UROBILINOGEN 0.2 11/15/2014 1537   NITRITE POSITIVE (A) 09/11/2017 1749   LEUKOCYTESUR TRACE (A) 09/11/2017 1749   Sepsis Labs: @LABRCNTIP (procalcitonin:4,lacticidven:4) )No results found for this or any previous visit (from the past 240 hour(s)).   Radiological Exams on Admission: Ct Head Wo Contrast  Result Date: 09/11/2017 CLINICAL DATA:  Headache and neck pain EXAM: CT HEAD WITHOUT CONTRAST CT CERVICAL SPINE WITHOUT CONTRAST TECHNIQUE: Multidetector CT imaging of the head and cervical spine was performed following the standard protocol without intravenous contrast. Multiplanar CT image reconstructions of the cervical spine were also generated. COMPARISON:  Head CT November 24, 2014; cervical spine CT May 21, 2012; cervical MRI Mar 27, 2016 FINDINGS: CT HEAD FINDINGS Brain: In comparison with the prior study from 2016, there is a small subdural hygroma on the left which involves frontal, superior temporal, and left parietal lobes. The maximum thickness of this hygroma is noted in the left parietal region measuring 4 mm. There is modest mass effect on the left supratentorial structures without midline shift. No acute appearing hemorrhage is seen intraaxially or extraaxially on this study. There is mild underlying atrophy. There is no  intracranial mass. There is patchy small vessel disease throughout the centra semiovale bilaterally. There are lacunar type infarcts in the head of the caudate nucleus as well as in the head of the caudate nucleus on the left. There is also prior infarct in the anterior, medial globus pallidum on the left with involvement of portions of the anterior lentiform nucleus as well as anterior  limbs of the internal and external capsules on the left. Basal ganglia calcification is felt to be physiologic in this age group. There is evidence of a prior infarct in the inferior right occipital lobe, stable. No acute infarct is evident on this study. Vascular: There is no appreciable hyperdense vessel. There is calcification in each carotid siphon region. There is also calcification in each distal vertebral artery. Skull: The bony calvarium appears intact. Sinuses/Orbits: There is mild mucosal thickening in several ethmoid air cells bilaterally. There is mild mucosal thickening in the posteromedial left maxillary antrum. Other visualized paranasal sinuses are clear. Orbits appear symmetric bilaterally. Other: Mastoid air cells are clear. CT CERVICAL SPINE FINDINGS Alignment: There is 3 mm of anterolisthesis of C7 on T1, stable. No new spondylolisthesis. Skull base and vertebrae: Skull base and craniocervical junction regions appear normal. There is no evident fracture. No blastic or lytic bone lesions. Bones appear somewhat osteoporotic. Soft tissues and spinal canal: Prevertebral soft tissues and predental space regions are normal. No paraspinous lesions. No cord or canal hematoma appreciable. Disc levels: There is moderately severe disc space narrowing at C5-6 and C6-7. There is milder narrowing at C3-4, C4-5, and C7-T1. There is multilevel facet osteoarthritic change. Exit foraminal narrowing is most notable on the left at C3-4 due to bony hypertrophy. There is no frank disc extrusion or stenosis. Upper chest: There is  scarring in the lung apices, more on the right than on the left, stable. Other: There is atherosclerotic calcification in the carotid arteries. IMPRESSION: CT head: 1. Small left sided subdural hygroma with maximum thickness of the hygroma measured at 4 mm. There is modest mass effect on the left supratentorial structures, but there is no midline shift. No acute hemorrhage evident. 2. There is atrophy with prior right occipital infarct. There are prior infarcts in the left basal ganglia anteriorly as well as in the head of the caudate nucleus on the right. No acute infarct evident. 3.  Foci of arterial vascular calcification noted. 4.  Mucosal thickening in ethmoid air cells. CT cervical spine: No fracture. Stable mild spondylolisthesis at C7 on T1, felt to be due to underlying spondylosis. There is multilevel osteoarthritic change. There are foci of calcification in each carotid artery. Critical Value/emergent results were called by telephone at the time of interpretation on 09/11/2017 at 1:50 pm to Dr. Milton Ferguson , who verbally acknowledged these results. Electronically Signed   By: Lowella Grip III M.D.   On: 09/11/2017 13:50   Ct Cervical Spine Wo Contrast  Result Date: 09/11/2017 CLINICAL DATA:  Headache and neck pain EXAM: CT HEAD WITHOUT CONTRAST CT CERVICAL SPINE WITHOUT CONTRAST TECHNIQUE: Multidetector CT imaging of the head and cervical spine was performed following the standard protocol without intravenous contrast. Multiplanar CT image reconstructions of the cervical spine were also generated. COMPARISON:  Head CT November 24, 2014; cervical spine CT May 21, 2012; cervical MRI Mar 27, 2016 FINDINGS: CT HEAD FINDINGS Brain: In comparison with the prior study from 2016, there is a small subdural hygroma on the left which involves frontal, superior temporal, and left parietal lobes. The maximum thickness of this hygroma is noted in the left parietal region measuring 4 mm. There is modest mass  effect on the left supratentorial structures without midline shift. No acute appearing hemorrhage is seen intraaxially or extraaxially on this study. There is mild underlying atrophy. There is no intracranial mass. There is patchy small vessel disease throughout the centra semiovale bilaterally. There are lacunar  type infarcts in the head of the caudate nucleus as well as in the head of the caudate nucleus on the left. There is also prior infarct in the anterior, medial globus pallidum on the left with involvement of portions of the anterior lentiform nucleus as well as anterior limbs of the internal and external capsules on the left. Basal ganglia calcification is felt to be physiologic in this age group. There is evidence of a prior infarct in the inferior right occipital lobe, stable. No acute infarct is evident on this study. Vascular: There is no appreciable hyperdense vessel. There is calcification in each carotid siphon region. There is also calcification in each distal vertebral artery. Skull: The bony calvarium appears intact. Sinuses/Orbits: There is mild mucosal thickening in several ethmoid air cells bilaterally. There is mild mucosal thickening in the posteromedial left maxillary antrum. Other visualized paranasal sinuses are clear. Orbits appear symmetric bilaterally. Other: Mastoid air cells are clear. CT CERVICAL SPINE FINDINGS Alignment: There is 3 mm of anterolisthesis of C7 on T1, stable. No new spondylolisthesis. Skull base and vertebrae: Skull base and craniocervical junction regions appear normal. There is no evident fracture. No blastic or lytic bone lesions. Bones appear somewhat osteoporotic. Soft tissues and spinal canal: Prevertebral soft tissues and predental space regions are normal. No paraspinous lesions. No cord or canal hematoma appreciable. Disc levels: There is moderately severe disc space narrowing at C5-6 and C6-7. There is milder narrowing at C3-4, C4-5, and C7-T1. There is  multilevel facet osteoarthritic change. Exit foraminal narrowing is most notable on the left at C3-4 due to bony hypertrophy. There is no frank disc extrusion or stenosis. Upper chest: There is scarring in the lung apices, more on the right than on the left, stable. Other: There is atherosclerotic calcification in the carotid arteries. IMPRESSION: CT head: 1. Small left sided subdural hygroma with maximum thickness of the hygroma measured at 4 mm. There is modest mass effect on the left supratentorial structures, but there is no midline shift. No acute hemorrhage evident. 2. There is atrophy with prior right occipital infarct. There are prior infarcts in the left basal ganglia anteriorly as well as in the head of the caudate nucleus on the right. No acute infarct evident. 3.  Foci of arterial vascular calcification noted. 4.  Mucosal thickening in ethmoid air cells. CT cervical spine: No fracture. Stable mild spondylolisthesis at C7 on T1, felt to be due to underlying spondylosis. There is multilevel osteoarthritic change. There are foci of calcification in each carotid artery. Critical Value/emergent results were called by telephone at the time of interpretation on 09/11/2017 at 1:50 pm to Dr. Milton Ferguson , who verbally acknowledged these results. Electronically Signed   By: Lowella Grip III M.D.   On: 09/11/2017 13:50     EKG: Independently reviewed.  Sinus rhythm, QTC 442, PVC, poor R-wave progression.  Assessment/Plan Principal Problem:   Fall Active Problems:   Hypothyroidism   HYPERLIPIDEMIA   History of CVA (cerebrovascular accident)   Restless leg syndrome   Parkinson disease ([place])   UTI (urinary tract infection)   Subdural hygroma   Fall: patient has frequent fall recently. Likely due to multifactorial etiology, including old age, Parkinson's disease, history of stroke, poor balance and UTI. CT head showed a small subdural hygroma with modest mass effect. EDP (Dr. William Dalton) consulted  neurosurgeon on call (ED physician did not write down surgeon's name)-->no surgery needed. Patient lives alone, seems not be able to take care of herself  anymore.  -will place on tele bed  -pt/ot -consult CM and SW for possible placement for rehabilitation -check orthostatic vital sign -IV fluids: 500 mL normal saline, followed by 75 mL per hour  Subdural hygroma:  -see above. No surgery needed per neurosurgeon  Parkinson disease [facility]): pt is not taking her rytary. She said that her Parkinson's disease is at borderline -pt/ot  Hypothyroidism: Last TSH was 7.416 today -Continue home Synthroid, blood increase dose from 25-37.5 micrograms -Check TSH in 6 weeks  Hx of stroke -on ASA  Restless leg syndrome: -pramipexole   DVT ppx: SCD Code Status: Full code Family Communication: Yes, patient's 2 friends  at bed side Disposition Plan:  Anticipate discharge back to previous home environment Consults called:  none Admission status: Obs / inpt   Date of Service 09/12/2017    Elizabeth [name], [facility] Pager [phone]  If 7PM-7AM, please contact night-coverage [email or web address] Password [email or web address] 09/12/2017, 12:35 AM

## 2017-09-11 NOTE — Telephone Encounter (Signed)
Pts Nephews wife Earlie Server, called stating the patient needs a referral for 24 hour home care. The patient is still in the hospital now, she was advised she will need to come in for a hospital follow up, she states she does not believe she can come in for an appointment because she is so incapacitated and cannot walk and is weak. She would like the referral entered in, please advise and call back. Either call Earlie Server or Jonny Ruiz is at the hospital now with her.  Please advise   Mobile Phone Number [phone]

## 2017-09-11 NOTE — ED Notes (Signed)
Bed: [facility]ALD Expected date:  Expected time:  Means of arrival:  Comments: 

## 2017-09-11 NOTE — ED Provider Notes (Signed)
Forehead laceration was repaired by my PA student under my direct supervision.    LACERATION REPAIR Performed by: Domenic Moras Authorized byDomenic Moras Consent: Verbal consent obtained. Risks and benefits: risks, benefits and alternatives were discussed Consent given by: patient Patient identity confirmed: provided demographic data Prepped and Draped in normal sterile fashion Wound explored  Laceration Location: L forehead Laceration Length: 2cm  No Foreign Bodies seen or palpated  Anesthesia: local infiltration  Local anesthetic: lidocaine 2% w epinephrine  Anesthetic total: 3 ml  Irrigation method: syringe Amount of cleaning: standard  Skin closure: prolene 6.0  Number of sutures: 4  Technique: simple interrupted  Patient tolerance: Patient tolerated the procedure well with no immediate complications.    Domenic Moras, PA-C 09/11/17 1613    Milton Ferguson, MD 09/11/17 [phone]

## 2017-09-11 NOTE — ED Notes (Signed)
Bed: WA01 Expected date:  Expected time:  Means of arrival:  Comments: HALL D

## 2017-09-11 NOTE — ED Triage Notes (Addendum)
Per [facility] patient from home fell out of bed sometime last night and was found by someone who was staying with patient called EMS. Patient has about one inch laceration on forehead.  Patient had multiple falls in past couple weeks. A&Ox4.  patient came in with soft c-collar in place that she has from ortho MD that she seen. Not on any blood thinners. Patient denies LOC. Patient denies any pain at this time.

## 2017-09-11 NOTE — Telephone Encounter (Signed)
I spoke with Earlie Server to inform that in order for MD to order anything, pt would have to be seen. She was okay with that and appt has been scheduled for Monday. Will cancel appt is pt gets admitted.

## 2017-09-11 NOTE — ED Provider Notes (Addendum)
[facility] DEPT Provider Note   CSN: [id number] Arrival date & time: 09/11/17  4132     History   Chief Complaint Chief Complaint  Patient presents with  . Fall  . Head Laceration    HPI Elizabeth [name] is a 83 y.o. female.  Patient fell and hit her head. No loss of consciousness   The history is provided by the patient. No language interpreter was used.  Fall  This is a new problem. The current episode started 1 to 2 hours ago. The problem occurs rarely. The problem has been resolved. Pertinent negatives include no chest pain, no abdominal pain and no headaches. Nothing aggravates the symptoms. Nothing relieves the symptoms. She has tried nothing for the symptoms. The treatment provided no relief.  Head Laceration  Pertinent negatives include no chest pain, no abdominal pain and no headaches.    Past Medical History:  Diagnosis Date  . Abnormal Pap smear of vagina 09/2006   Dr Nori Riis  . Hyperlipidemia   . Hypothyroidism   . Osteopenia    PMH fracture heel, wrist  . Pernicious anemia   . Post-polio syndrome    minor problems L leg  . Vitamin B12 deficiency     Patient Active Problem List   Diagnosis Date Noted  . Weakness 06/24/2017  . UTI (urinary tract infection) 06/24/2017  . Swallowing disorder 03/04/2017  . Parkinson disease ([place]) 06/25/2016  . Restless leg syndrome 12/25/2015  . Arthritis 12/25/2015  . Abnormality of gait 05/24/2015  . Intention tremor 12/08/2013  . History of CVA (cerebrovascular accident) 07/20/2012  . IRRITABLE BOWEL SYNDROME 01/17/2009  . Cervicalgia 01/17/2009  . Urinary incontinence 09/07/2008  . ANEMIA, B12 DEFICIENCY 04/26/2008  . HYPERLIPIDEMIA 01/26/2008  . History of TIA (transient ischemic attack) 01/26/2008  . Hypothyroidism 01/04/2008  . Osteopenia 01/04/2008    Past Surgical History:  Procedure Laterality Date  . BLADDER SUSPENSION  2000  . no colonoscopy     "I never felt I needed  one "    OB History    No data available       Home Medications    Prior to Admission medications   Medication Sig Start Date End Date Taking? Authorizing Provider  aspirin 81 MG tablet Take 81 mg by mouth daily.     Yes [provider]  Calcium Carbonate-Vitamin D (CALCIUM + D PO) Take 1 tablet by mouth daily. Reported on 12/25/2015   Yes [provider]  cholecalciferol (VITAMIN D) 1000 UNITS tablet Take 1,000 Units by mouth daily.     Yes [provider]  levothyroxine (SYNTHROID, LEVOTHROID) 25 MCG tablet TAKE 1 TABLET DAILY. 07/29/17  Yes Burns, Claudina Lick, MD  pramipexole (MIRAPEX) 0.125 MG tablet Take 1 tablet (0.125 mg total) by mouth 2 (two) times daily. Patient taking differently: Take 0.125 mg by mouth at bedtime.  09/18/16  Yes Tat, Eustace Quail, DO  Simethicone (GAS-X EXTRA STRENGTH) 125 MG CAPS Take 125 mg by mouth daily as needed (gas).   Yes [provider]  tolterodine (DETROL LA) 4 MG 24 hr capsule Take 4 mg by mouth daily.  09/03/16  Yes [provider]  vitamin B-12 (CYANOCOBALAMIN) 1000 MCG tablet Take 1 tablet (1,000 mcg total) by mouth daily. Patient taking differently: Take 5,000 mcg by mouth daily.  08/06/16  Yes Burns, Claudina Lick, MD  Carbidopa-Levodopa ER (RYTARY) 36.25-145 MG CPCR Take 1 tablet by mouth 3 (three) times daily. Patient not taking:  Reported on 09/11/2017 01/08/17   Tat, Eustace Quail, DO  Misc. Devices (ROLLATOR ULTRA-LIGHT) MISC Use as directed 06/29/17   Binnie Rail, MD    Family History Family History  Problem Relation Age of Onset  . Heart attack Maternal Grandfather        70s  . Emphysema Mother   . Hypertension Mother   . Alzheimer's disease Father   . Breast cancer Maternal Grandmother   . Cancer Maternal Grandmother        breast  . Diabetes Neg Hx   . Stroke Neg Hx   . Parkinson's disease Neg Hx     Social History Social History  Substance Use Topics  . Smoking status: Never Smoker  .  Smokeless tobacco: Never Used  . Alcohol use 0.0 oz/week     Comment: wine very rarely (at Christmas)     Allergies   Atorvastatin; Crestor [rosuvastatin calcium]; and Vesicare [solifenacin]   Review of Systems Review of Systems  Constitutional: Negative for appetite change and fatigue.  HENT: Negative for congestion, ear discharge and sinus pressure.        Laceration to scalp  Eyes: Negative for discharge.  Respiratory: Negative for cough.   Cardiovascular: Negative for chest pain.  Gastrointestinal: Negative for abdominal pain and diarrhea.  Genitourinary: Negative for frequency and hematuria.  Musculoskeletal: Negative for back pain.  Skin: Negative for rash.  Neurological: Negative for seizures and headaches.  Psychiatric/Behavioral: Negative for hallucinations.     Physical Exam Updated Vital Signs BP (!) 120/55 (BP Location: Right Arm)   Pulse 70   Temp (!) 97.4 F (36.3 C) (Oral)   Resp 16   SpO2 100%   Physical Exam  Constitutional: She is oriented to person, place, and time. She appears well-developed.  HENT:  Head: Normocephalic.  2 cm laceration to forehead  Eyes: Conjunctivae and EOM are normal. No scleral icterus.  Neck: Neck supple. No thyromegaly present.  Cardiovascular: Normal rate and regular rhythm.  Exam reveals no gallop and no friction rub.   No murmur heard. Pulmonary/Chest: No stridor. She has no wheezes. She has no rales. She exhibits no tenderness.  Abdominal: She exhibits no distension. There is no tenderness. There is no rebound.  Musculoskeletal: Normal range of motion. She exhibits no edema.  Lymphadenopathy:    She has no cervical adenopathy.  Neurological: She is oriented to person, place, and time. She exhibits normal muscle tone. Coordination normal.  Skin: No rash noted. No erythema.  Psychiatric: She has a normal mood and affect. Her behavior is normal.     ED Treatments / Results  Labs (all labs ordered are listed, but  only abnormal results are displayed) Labs Reviewed - No data to display  EKG  EKG Interpretation None       Radiology Ct Head Wo Contrast  Result Date: 09/11/2017 CLINICAL DATA:  Headache and neck pain EXAM: CT HEAD WITHOUT CONTRAST CT CERVICAL SPINE WITHOUT CONTRAST TECHNIQUE: Multidetector CT imaging of the head and cervical spine was performed following the standard protocol without intravenous contrast. Multiplanar CT image reconstructions of the cervical spine were also generated. COMPARISON:  Head CT November 24, 2014; cervical spine CT May 21, 2012; cervical MRI Mar 27, 2016 FINDINGS: CT HEAD FINDINGS Brain: In comparison with the prior study from 2016, there is a small subdural hygroma on the left which involves frontal, superior temporal, and left parietal lobes. The maximum thickness of this hygroma is noted in the left parietal  region measuring 4 mm. There is modest mass effect on the left supratentorial structures without midline shift. No acute appearing hemorrhage is seen intraaxially or extraaxially on this study. There is mild underlying atrophy. There is no intracranial mass. There is patchy small vessel disease throughout the centra semiovale bilaterally. There are lacunar type infarcts in the head of the caudate nucleus as well as in the head of the caudate nucleus on the left. There is also prior infarct in the anterior, medial globus pallidum on the left with involvement of portions of the anterior lentiform nucleus as well as anterior limbs of the internal and external capsules on the left. Basal ganglia calcification is felt to be physiologic in this age group. There is evidence of a prior infarct in the inferior right occipital lobe, stable. No acute infarct is evident on this study. Vascular: There is no appreciable hyperdense vessel. There is calcification in each carotid siphon region. There is also calcification in each distal vertebral artery. Skull: The bony calvarium  appears intact. Sinuses/Orbits: There is mild mucosal thickening in several ethmoid air cells bilaterally. There is mild mucosal thickening in the posteromedial left maxillary antrum. Other visualized paranasal sinuses are clear. Orbits appear symmetric bilaterally. Other: Mastoid air cells are clear. CT CERVICAL SPINE FINDINGS Alignment: There is 3 mm of anterolisthesis of C7 on T1, stable. No new spondylolisthesis. Skull base and vertebrae: Skull base and craniocervical junction regions appear normal. There is no evident fracture. No blastic or lytic bone lesions. Bones appear somewhat osteoporotic. Soft tissues and spinal canal: Prevertebral soft tissues and predental space regions are normal. No paraspinous lesions. No cord or canal hematoma appreciable. Disc levels: There is moderately severe disc space narrowing at C5-6 and C6-7. There is milder narrowing at C3-4, C4-5, and C7-T1. There is multilevel facet osteoarthritic change. Exit foraminal narrowing is most notable on the left at C3-4 due to bony hypertrophy. There is no frank disc extrusion or stenosis. Upper chest: There is scarring in the lung apices, more on the right than on the left, stable. Other: There is atherosclerotic calcification in the carotid arteries. IMPRESSION: CT head: 1. Small left sided subdural hygroma with maximum thickness of the hygroma measured at 4 mm. There is modest mass effect on the left supratentorial structures, but there is no midline shift. No acute hemorrhage evident. 2. There is atrophy with prior right occipital infarct. There are prior infarcts in the left basal ganglia anteriorly as well as in the head of the caudate nucleus on the right. No acute infarct evident. 3.  Foci of arterial vascular calcification noted. 4.  Mucosal thickening in ethmoid air cells. CT cervical spine: No fracture. Stable mild spondylolisthesis at C7 on T1, felt to be due to underlying spondylosis. There is multilevel osteoarthritic change.  There are foci of calcification in each carotid artery. Critical Value/emergent results were called by telephone at the time of interpretation on 09/11/2017 at 1:50 pm to Dr. Milton Ferguson , who verbally acknowledged these results. Electronically Signed   By: Lowella Grip III M.D.   On: 09/11/2017 13:50   Ct Cervical Spine Wo Contrast  Result Date: 09/11/2017 CLINICAL DATA:  Headache and neck pain EXAM: CT HEAD WITHOUT CONTRAST CT CERVICAL SPINE WITHOUT CONTRAST TECHNIQUE: Multidetector CT imaging of the head and cervical spine was performed following the standard protocol without intravenous contrast. Multiplanar CT image reconstructions of the cervical spine were also generated. COMPARISON:  Head CT November 24, 2014; cervical spine CT May 21, 2012; cervical MRI Mar 27, 2016 FINDINGS: CT HEAD FINDINGS Brain: In comparison with the prior study from 2016, there is a small subdural hygroma on the left which involves frontal, superior temporal, and left parietal lobes. The maximum thickness of this hygroma is noted in the left parietal region measuring 4 mm. There is modest mass effect on the left supratentorial structures without midline shift. No acute appearing hemorrhage is seen intraaxially or extraaxially on this study. There is mild underlying atrophy. There is no intracranial mass. There is patchy small vessel disease throughout the centra semiovale bilaterally. There are lacunar type infarcts in the head of the caudate nucleus as well as in the head of the caudate nucleus on the left. There is also prior infarct in the anterior, medial globus pallidum on the left with involvement of portions of the anterior lentiform nucleus as well as anterior limbs of the internal and external capsules on the left. Basal ganglia calcification is felt to be physiologic in this age group. There is evidence of a prior infarct in the inferior right occipital lobe, stable. No acute infarct is evident on this study.  Vascular: There is no appreciable hyperdense vessel. There is calcification in each carotid siphon region. There is also calcification in each distal vertebral artery. Skull: The bony calvarium appears intact. Sinuses/Orbits: There is mild mucosal thickening in several ethmoid air cells bilaterally. There is mild mucosal thickening in the posteromedial left maxillary antrum. Other visualized paranasal sinuses are clear. Orbits appear symmetric bilaterally. Other: Mastoid air cells are clear. CT CERVICAL SPINE FINDINGS Alignment: There is 3 mm of anterolisthesis of C7 on T1, stable. No new spondylolisthesis. Skull base and vertebrae: Skull base and craniocervical junction regions appear normal. There is no evident fracture. No blastic or lytic bone lesions. Bones appear somewhat osteoporotic. Soft tissues and spinal canal: Prevertebral soft tissues and predental space regions are normal. No paraspinous lesions. No cord or canal hematoma appreciable. Disc levels: There is moderately severe disc space narrowing at C5-6 and C6-7. There is milder narrowing at C3-4, C4-5, and C7-T1. There is multilevel facet osteoarthritic change. Exit foraminal narrowing is most notable on the left at C3-4 due to bony hypertrophy. There is no frank disc extrusion or stenosis. Upper chest: There is scarring in the lung apices, more on the right than on the left, stable. Other: There is atherosclerotic calcification in the carotid arteries. IMPRESSION: CT head: 1. Small left sided subdural hygroma with maximum thickness of the hygroma measured at 4 mm. There is modest mass effect on the left supratentorial structures, but there is no midline shift. No acute hemorrhage evident. 2. There is atrophy with prior right occipital infarct. There are prior infarcts in the left basal ganglia anteriorly as well as in the head of the caudate nucleus on the right. No acute infarct evident. 3.  Foci of arterial vascular calcification noted. 4.  Mucosal  thickening in ethmoid air cells. CT cervical spine: No fracture. Stable mild spondylolisthesis at C7 on T1, felt to be due to underlying spondylosis. There is multilevel osteoarthritic change. There are foci of calcification in each carotid artery. Critical Value/emergent results were called by telephone at the time of interpretation on 09/11/2017 at 1:50 pm to Dr. Milton Ferguson , who verbally acknowledged these results. Electronically Signed   By: Lowella Grip III M.D.   On: 09/11/2017 13:50    Procedures Procedures (including critical care time)  Medications Ordered in ED Medications  lidocaine-EPINEPHrine-tetracaine (LET) solution (not administered)  lidocaine-EPINEPHrine (XYLOCAINE W/EPI) 2 %-1:200000 (PF) injection 10 mL (not administered)  Tdap (BOOSTRIX) injection 0.5 mL (not administered)     Initial Impression / Assessment and Plan / ED Course  I have reviewed the triage vital signs and the nursing notes.  Pertinent labs & imaging results that were available during my care of the patient were reviewed by me and considered in my medical decision making (see chart for details).    Patient fell and has a 2 cm laceration to forehead that was sutured. Patient also had a small hygroma on CT scan. I spoke with neurosurgery and it was felt there was nothing to do about this she didn't not need any further follow-up for this. Patient will have lacerations out in 5-7 days When the patient's friend came to pick her up. They stated that she has fallen 5 times in the last 8 days that she has been more confused than normal and that she cannot take care of herself. Patient will be worked up further and admitted to the hospital for frequent falls Final Clinical Impressions(s) / ED Diagnoses   Final diagnoses:  Injury of head, initial encounter    New Prescriptions New Prescriptions   No medications on file     Milton Ferguson, MD 09/11/17 1605    Milton Ferguson, MD 09/11/17  [phone]

## 2017-09-11 NOTE — ED Provider Notes (Signed)
5:14 PM Care assumed from Dr. Roderic Palau.   Pt is an 83 year old Female with a hx of Hypothyroid, HLD, osteopenia, prior CVA, Parkinson's disease, and irritable bowel syndrome who presented for falls. According to previous team, patient had 5 falls in 8 days. Patient had imaging today showing no acute intracranial injury but there was evidence of hygroma and prior strokes. Neurosurgery was reportedly called who recommended acute intervention necessary.  Patient's laceration was repaired by emergency team. Next  As patient was getting ready for discharge, patient's neighbors reported that she could not go home as she has had numerous falls the last few days and lives alone. Neighbors also report that home health saw the patient yesterday and did not feel she was strong enough to even do home rehabilitation and she would need a rehabilitation facility for PT/OT needs.  Workup was thus initiated to look for infection, or joint abnormality, or other abnormality contributing to falls.  6:55 PM Diagnostic testing results are seen above. Patient was found to have urinary tract infection with nitrites and leukocytes and bacteria in her urine. Patient will be treated with antibiotics. Patient also found to have low potassium. This will be supplemented. TSH was also elevated.  Patient will require admission for her electrolyte abnormality, urinary tract infection that may have contributed to her numerous falls. Patient will need PT/OT evaluation to determine safe level of care given the concerns of home health team.   Hospitalist team called for admission.    Clinical Impression: 1. Injury of head, initial encounter   2. Acute cystitis without hematuria   3. Fall, initial encounter     Disposition: Admit to Hospitalist service    Tegeler, Gwenyth Allegra, MD 09/11/17 2027

## 2017-09-12 DIAGNOSIS — S0191XA Laceration without foreign body of unspecified part of head, initial encounter: Secondary | ICD-10-CM | POA: Diagnosis not present

## 2017-09-12 DIAGNOSIS — G960 Cerebrospinal fluid leak: Secondary | ICD-10-CM | POA: Diagnosis not present

## 2017-09-12 DIAGNOSIS — E538 Deficiency of other specified B group vitamins: Secondary | ICD-10-CM | POA: Diagnosis not present

## 2017-09-12 DIAGNOSIS — E039 Hypothyroidism, unspecified: Secondary | ICD-10-CM | POA: Diagnosis not present

## 2017-09-12 DIAGNOSIS — N3000 Acute cystitis without hematuria: Secondary | ICD-10-CM | POA: Diagnosis not present

## 2017-09-12 DIAGNOSIS — Z7982 Long term (current) use of aspirin: Secondary | ICD-10-CM | POA: Diagnosis not present

## 2017-09-12 DIAGNOSIS — M8580 Other specified disorders of bone density and structure, unspecified site: Secondary | ICD-10-CM | POA: Diagnosis not present

## 2017-09-12 DIAGNOSIS — Z23 Encounter for immunization: Secondary | ICD-10-CM | POA: Diagnosis not present

## 2017-09-12 DIAGNOSIS — S0181XA Laceration without foreign body of other part of head, initial encounter: Secondary | ICD-10-CM | POA: Diagnosis not present

## 2017-09-12 DIAGNOSIS — Z8249 Family history of ischemic heart disease and other diseases of the circulatory system: Secondary | ICD-10-CM | POA: Diagnosis not present

## 2017-09-12 DIAGNOSIS — Z820 Family history of epilepsy and other diseases of the nervous system: Secondary | ICD-10-CM | POA: Diagnosis not present

## 2017-09-12 DIAGNOSIS — Z825 Family history of asthma and other chronic lower respiratory diseases: Secondary | ICD-10-CM | POA: Diagnosis not present

## 2017-09-12 DIAGNOSIS — Z803 Family history of malignant neoplasm of breast: Secondary | ICD-10-CM | POA: Diagnosis not present

## 2017-09-12 DIAGNOSIS — G20 Parkinson's disease: Secondary | ICD-10-CM | POA: Diagnosis not present

## 2017-09-12 DIAGNOSIS — G2581 Restless legs syndrome: Secondary | ICD-10-CM | POA: Diagnosis not present

## 2017-09-12 DIAGNOSIS — K589 Irritable bowel syndrome without diarrhea: Secondary | ICD-10-CM | POA: Diagnosis not present

## 2017-09-12 DIAGNOSIS — Z8673 Personal history of transient ischemic attack (TIA), and cerebral infarction without residual deficits: Secondary | ICD-10-CM | POA: Diagnosis not present

## 2017-09-12 DIAGNOSIS — W19XXXA Unspecified fall, initial encounter: Secondary | ICD-10-CM | POA: Diagnosis not present

## 2017-09-12 DIAGNOSIS — E785 Hyperlipidemia, unspecified: Secondary | ICD-10-CM | POA: Diagnosis not present

## 2017-09-12 LAB — BASIC METABOLIC PANEL
Anion gap: 8 (ref 5–15)
BUN: 10 mg/dL (ref 6–20)
CHLORIDE: 106 mmol/L (ref 101–111)
CO2: 24 mmol/L (ref 22–32)
Calcium: 7.7 mg/dL — ABNORMAL LOW (ref 8.9–10.3)
Creatinine, Ser: 0.62 mg/dL (ref 0.44–1.00)
GFR calc Af Amer: >60 mL/min (ref >60)
GFR calc non Af Amer: >60 mL/min (ref >60)
Glucose, Bld: 105 mg/dL — ABNORMAL HIGH (ref 65–99)
POTASSIUM: 3.3 mmol/L — AB (ref 3.5–5.1)
SODIUM: 138 mmol/L (ref 135–145)

## 2017-09-12 LAB — CBC
HEMATOCRIT: 33.7 % — AB (ref 36.0–46.0)
Hemoglobin: 11.3 g/dL — ABNORMAL LOW (ref 12.0–15.0)
MCH: 28 pg (ref 26.0–34.0)
MCHC: 33.5 g/dL (ref 30.0–36.0)
MCV: 83.4 fL (ref 78.0–100.0)
Platelets: 251 10*3/uL (ref 150–400)
RBC: 4.04 MIL/uL (ref 3.87–5.11)
RDW: 15.5 % (ref 11.5–15.5)
WBC: 7.5 10*3/uL (ref 4.0–10.5)

## 2017-09-12 MED ORDER — MUPIROCIN 2 % EX OINT
TOPICAL_OINTMENT | Freq: Every day | CUTANEOUS | Status: DC
  Administered 2017-09-13 – 2017-09-14 (×2): via TOPICAL
  Filled 2017-09-12 (×3): qty 22

## 2017-09-12 NOTE — Progress Notes (Signed)
PROGRESS NOTE    Elizabeth [name]  EPP:[id number] DOB: [birth year] DOA: 09/11/2017 PCP: Binnie Rail, MD   Brief Narrative:   Elizabeth [name] is a 83 y.o. female with medical history significant of stroke, hypothyroidism, hyperlipidemia, Parkinson's disease, pernicious anemia, RLS, post polio syndrome, who presents with fall, increased urinary frequency. Patient states that he recently she has generalized weakness and frequent fall. She has had at least 6 falls in the past 2 weeks. She did not lose consciousness. She has poor balance, but no unilateral weakness, numbness or tingling to extremities. No facial droop, slurred speech or hearing loss. She was noted to have a small left sided subdural hygroma on head CT which reviewed by Neurosurgery after discussion with ED physician and noted to be stable.  She is noted to have a possible UTI for which she has been started on Rocephin with cultures pending. PT/OT eval pending. No new concerns this am. Potassium was replaced earlier.   Assessment & Plan:   Principal Problem:   Fall Active Problems:   Hypothyroidism   History of CVA (cerebrovascular accident)   Restless leg syndrome   Parkinson disease ([place])   UTI (urinary tract infection)   Subdural hygroma   Fall: patient has frequent fall recently. Likely due to multifactorial etiology, including old age, Parkinson's disease, history of stroke, poor balance and UTI. CT head showed a small subdural hygroma with modest mass effect. EDP (Dr. William Dalton) consulted neurosurgeon on call (ED physician did not write down surgeon's name)-->no surgery needed. Patient lives alone, seems not be able to take care of herself anymore.  -will place on tele bed  -pt/ot pending -consult CM and SW for possible placement for rehabilitation -OSV within normal limits -IV fluids: 500 mL normal saline, followed by 75 mL per hour for 1 L -Advance diet as tolerated  Subdural hygroma-stable: -see above. No surgery  needed per neurosurgeon  Parkinson disease [facility]): pt is not taking her rytary. She said that her Parkinson's disease is at borderline -pt/ot  Hypothyroidism: Last TSH was 7.416 today -Continue home Synthroid, blood increase dose from 25-37.5 micrograms -Check TSH in 6 weeks  Hx of stroke -on ASA  Restless leg syndrome: -pramipexole  Possible UTI -Continue Rocephin -UCx pending   DVT ppx: SCD Code Status: Full code Family Communication: Yes, patient's 2 friends  at bed side on 10/19 Disposition Plan:  Anticipate discharge to SNF/Rehab Consults called:  none Admission status: Obs / inpt  Consultants:   None   Procedures: None  Antimicrobials: Rocephin 09/11/2017->  Objective: Vitals:   09/11/17 0939 09/11/17 2023 09/11/17 2100 09/12/17 0607  BP: (!) 120/55 121/65 (!) 142/58 134/62  Pulse: 70 85 72 79  Resp: 16 18 18 16   Temp: (!) 97.4 F (36.3 C)  98.2 F (36.8 C) 98.1 F (36.7 C)  TempSrc: Oral  Oral Oral  SpO2: 100% 99% 99% 99%  Weight:    40.8 kg (90 lb)  Height:    4\' 11"  (1.499 m)    Intake/Output Summary (Last 24 hours) at 09/12/17 1317 Last data filed at 09/12/17 0600  Gross per 24 hour  Intake          1066.25 ml  Output                0 ml  Net          1066.25 ml   Filed Weights   09/12/17 0607  Weight: 40.8 kg (90 lb)  Examination:  General exam: Appears calm and comfortable ; wearing soft/neck collar Respiratory system: Clear to auscultation. Respiratory effort normal. Cardiovascular system: S1 & S2 heard, RRR. No JVD, murmurs, rubs, gallops or clicks. No pedal edema. Gastrointestinal system: Abdomen is nondistended, soft and nontender. No organomegaly or masses felt. Normal bowel sounds heard. Central nervous system: Alert and oriented. No focal neurological deficits. Extremities: Symmetric 5 x 5 power. Skin: No rashes, lesions or ulcers Psychiatry: Judgement and insight appear normal. Mood & affect appropriate.     Data  Reviewed: I have personally reviewed following labs and imaging studies  CBC:  Recent Labs Lab 09/11/17 1738 09/12/17 0502  WBC 5.7 7.5  NEUTROABS 3.8  --   HGB 11.6* 11.3*  HCT 34.6* 33.7*  MCV 83.8 83.4  PLT 229 751   Basic Metabolic Panel:  Recent Labs Lab 09/11/17 1738 09/11/17 2244 09/12/17 0502  NA 140  --  138  K 2.9*  --  3.3*  CL 104  --  106  CO2 26  --  24  GLUCOSE 87  --  105*  BUN 14  --  10  CREATININE 0.65  --  0.62  CALCIUM 8.3*  --  7.7*  MG  --  1.5*  --    GFR: Estimated Creatinine Clearance: 33.7 mL/min (by C-G formula based on SCr of 0.62 mg/dL). Liver Function Tests:  Recent Labs Lab 09/11/17 1738  AST 53*  ALT 54  ALKPHOS 111  BILITOT 1.4*  PROT 5.6*  ALBUMIN 2.8*   No results for input(s): LIPASE, AMYLASE in the last 168 hours. No results for input(s): AMMONIA in the last 168 hours. Coagulation Profile: No results for input(s): INR, PROTIME in the last 168 hours. Cardiac Enzymes: No results for input(s): CKTOTAL, CKMB, CKMBINDEX, TROPONINI in the last 168 hours. BNP (last 3 results) No results for input(s): PROBNP in the last 8760 hours. HbA1C: No results for input(s): HGBA1C in the last 72 hours. CBG: No results for input(s): GLUCAP in the last 168 hours. Lipid Profile: No results for input(s): CHOL, HDL, LDLCALC, TRIG, CHOLHDL, LDLDIRECT in the last 72 hours. Thyroid Function Tests:  Recent Labs  09/11/17 1738  TSH 7.416*   Anemia Panel: No results for input(s): VITAMINB12, FOLATE, FERRITIN, TIBC, IRON, RETICCTPCT in the last 72 hours. Sepsis Labs: No results for input(s): PROCALCITON, LATICACIDVEN in the last 168 hours.  Recent Results (from the past 240 hour(s))  Blood culture (routine x 2)     Status: None (Preliminary result)   Collection Time: 09/11/17 10:44 PM  Result Value Ref Range Status   Specimen Description BLOOD LEFT ANTECUBITAL  Final   Special Requests   Final    BOTTLES DRAWN AEROBIC ONLY Blood  Culture adequate volume Performed at [facility], [address], [place], [place] [address]    Culture PENDING  Incomplete   Report Status PENDING  Incomplete      Radiology Studies: Ct Head Wo Contrast  Result Date: 09/11/2017 CLINICAL DATA:  Headache and neck pain EXAM: CT HEAD WITHOUT CONTRAST CT CERVICAL SPINE WITHOUT CONTRAST TECHNIQUE: Multidetector CT imaging of the head and cervical spine was performed following the standard protocol without intravenous contrast. Multiplanar CT image reconstructions of the cervical spine were also generated. COMPARISON:  Head CT November 24, 2014; cervical spine CT May 21, 2012; cervical MRI Mar 27, 2016 FINDINGS: CT HEAD FINDINGS Brain: In comparison with the prior study from 2016, there is a small subdural hygroma on  the left which involves frontal, superior temporal, and left parietal lobes. The maximum thickness of this hygroma is noted in the left parietal region measuring 4 mm. There is modest mass effect on the left supratentorial structures without midline shift. No acute appearing hemorrhage is seen intraaxially or extraaxially on this study. There is mild underlying atrophy. There is no intracranial mass. There is patchy small vessel disease throughout the centra semiovale bilaterally. There are lacunar type infarcts in the head of the caudate nucleus as well as in the head of the caudate nucleus on the left. There is also prior infarct in the anterior, medial globus pallidum on the left with involvement of portions of the anterior lentiform nucleus as well as anterior limbs of the internal and external capsules on the left. Basal ganglia calcification is felt to be physiologic in this age group. There is evidence of a prior infarct in the inferior right occipital lobe, stable. No acute infarct is evident on this study. Vascular: There is no appreciable hyperdense vessel. There is calcification in each carotid siphon region. There is also  calcification in each distal vertebral artery. Skull: The bony calvarium appears intact. Sinuses/Orbits: There is mild mucosal thickening in several ethmoid air cells bilaterally. There is mild mucosal thickening in the posteromedial left maxillary antrum. Other visualized paranasal sinuses are clear. Orbits appear symmetric bilaterally. Other: Mastoid air cells are clear. CT CERVICAL SPINE FINDINGS Alignment: There is 3 mm of anterolisthesis of C7 on T1, stable. No new spondylolisthesis. Skull base and vertebrae: Skull base and craniocervical junction regions appear normal. There is no evident fracture. No blastic or lytic bone lesions. Bones appear somewhat osteoporotic. Soft tissues and spinal canal: Prevertebral soft tissues and predental space regions are normal. No paraspinous lesions. No cord or canal hematoma appreciable. Disc levels: There is moderately severe disc space narrowing at C5-6 and C6-7. There is milder narrowing at C3-4, C4-5, and C7-T1. There is multilevel facet osteoarthritic change. Exit foraminal narrowing is most notable on the left at C3-4 due to bony hypertrophy. There is no frank disc extrusion or stenosis. Upper chest: There is scarring in the lung apices, more on the right than on the left, stable. Other: There is atherosclerotic calcification in the carotid arteries. IMPRESSION: CT head: 1. Small left sided subdural hygroma with maximum thickness of the hygroma measured at 4 mm. There is modest mass effect on the left supratentorial structures, but there is no midline shift. No acute hemorrhage evident. 2. There is atrophy with prior right occipital infarct. There are prior infarcts in the left basal ganglia anteriorly as well as in the head of the caudate nucleus on the right. No acute infarct evident. 3.  Foci of arterial vascular calcification noted. 4.  Mucosal thickening in ethmoid air cells. CT cervical spine: No fracture. Stable mild spondylolisthesis at C7 on T1, felt to be  due to underlying spondylosis. There is multilevel osteoarthritic change. There are foci of calcification in each carotid artery. Critical Value/emergent results were called by telephone at the time of interpretation on 09/11/2017 at 1:50 pm to Dr. Milton Ferguson , who verbally acknowledged these results. Electronically Signed   By: Lowella Grip III M.D.   On: 09/11/2017 13:50   Ct Cervical Spine Wo Contrast  Result Date: 09/11/2017 CLINICAL DATA:  Headache and neck pain EXAM: CT HEAD WITHOUT CONTRAST CT CERVICAL SPINE WITHOUT CONTRAST TECHNIQUE: Multidetector CT imaging of the head and cervical spine was performed following the standard protocol without intravenous contrast.  Multiplanar CT image reconstructions of the cervical spine were also generated. COMPARISON:  Head CT November 24, 2014; cervical spine CT May 21, 2012; cervical MRI Mar 27, 2016 FINDINGS: CT HEAD FINDINGS Brain: In comparison with the prior study from 2016, there is a small subdural hygroma on the left which involves frontal, superior temporal, and left parietal lobes. The maximum thickness of this hygroma is noted in the left parietal region measuring 4 mm. There is modest mass effect on the left supratentorial structures without midline shift. No acute appearing hemorrhage is seen intraaxially or extraaxially on this study. There is mild underlying atrophy. There is no intracranial mass. There is patchy small vessel disease throughout the centra semiovale bilaterally. There are lacunar type infarcts in the head of the caudate nucleus as well as in the head of the caudate nucleus on the left. There is also prior infarct in the anterior, medial globus pallidum on the left with involvement of portions of the anterior lentiform nucleus as well as anterior limbs of the internal and external capsules on the left. Basal ganglia calcification is felt to be physiologic in this age group. There is evidence of a prior infarct in the inferior  right occipital lobe, stable. No acute infarct is evident on this study. Vascular: There is no appreciable hyperdense vessel. There is calcification in each carotid siphon region. There is also calcification in each distal vertebral artery. Skull: The bony calvarium appears intact. Sinuses/Orbits: There is mild mucosal thickening in several ethmoid air cells bilaterally. There is mild mucosal thickening in the posteromedial left maxillary antrum. Other visualized paranasal sinuses are clear. Orbits appear symmetric bilaterally. Other: Mastoid air cells are clear. CT CERVICAL SPINE FINDINGS Alignment: There is 3 mm of anterolisthesis of C7 on T1, stable. No new spondylolisthesis. Skull base and vertebrae: Skull base and craniocervical junction regions appear normal. There is no evident fracture. No blastic or lytic bone lesions. Bones appear somewhat osteoporotic. Soft tissues and spinal canal: Prevertebral soft tissues and predental space regions are normal. No paraspinous lesions. No cord or canal hematoma appreciable. Disc levels: There is moderately severe disc space narrowing at C5-6 and C6-7. There is milder narrowing at C3-4, C4-5, and C7-T1. There is multilevel facet osteoarthritic change. Exit foraminal narrowing is most notable on the left at C3-4 due to bony hypertrophy. There is no frank disc extrusion or stenosis. Upper chest: There is scarring in the lung apices, more on the right than on the left, stable. Other: There is atherosclerotic calcification in the carotid arteries. IMPRESSION: CT head: 1. Small left sided subdural hygroma with maximum thickness of the hygroma measured at 4 mm. There is modest mass effect on the left supratentorial structures, but there is no midline shift. No acute hemorrhage evident. 2. There is atrophy with prior right occipital infarct. There are prior infarcts in the left basal ganglia anteriorly as well as in the head of the caudate nucleus on the right. No acute infarct  evident. 3.  Foci of arterial vascular calcification noted. 4.  Mucosal thickening in ethmoid air cells. CT cervical spine: No fracture. Stable mild spondylolisthesis at C7 on T1, felt to be due to underlying spondylosis. There is multilevel osteoarthritic change. There are foci of calcification in each carotid artery. Critical Value/emergent results were called by telephone at the time of interpretation on 09/11/2017 at 1:50 pm to Dr. Milton Ferguson , who verbally acknowledged these results. Electronically Signed   By: Lowella Grip III M.D.   On:  09/11/2017 13:50     Scheduled Meds: . aspirin EC  81 mg Oral Daily  . calcium-vitamin D  1 tablet Oral Daily  . cholecalciferol  1,000 Units Oral Daily  . fesoterodine  4 mg Oral Daily  . levothyroxine  37.5 mcg Oral QAC breakfast  . mupirocin ointment   Topical Daily  . pramipexole  0.125 mg Oral BID  . vitamin B-12  1,000 mcg Oral Daily   Continuous Infusions: . sodium chloride 75 mL/hr at 09/11/17 2227  . cefTRIAXone (ROCEPHIN)  IV       LOS: 1 day    Time spent: 30 minutes    Chesky Heyer Darleen Crocker, DO [facility] Pager [phone]  If 7PM-7AM, please contact night-coverage [email or web address] Password [email or web address] 09/12/2017, 1:17 PM

## 2017-09-12 NOTE — Progress Notes (Signed)
Pt refused dressing change to forehead laceration. Reports the [place] rn told her that she did not need dressing changes. Pt made aware that recommendation dn order came form [facility] RN but pt reports she does not want to have dsg change done at this time.

## 2017-09-12 NOTE — Care Management Note (Signed)
Case Management Note  Patient Details  Name: Elizabeth [name] MRN: [id number] Date of Birth: 07-10-1931  Subjective/Objective:     Fall, UTI               Action/Plan: Discharge Planning: NCM spoke to pt and gave permission to call family. NCM contact nephew, Tinnie Kunin # [phone]. States pt was living at home alone and had a housekeeper, Judeth Porch that assist at home. States he does want SNF rehab in [place]. CSW referral for SNF.   PCP BURNS, STACY J   Expected Discharge Date:               Expected Discharge Plan:  [place]  In-House Referral:  Clinical Social Work  Discharge planning Services  CM Consult  Post Acute Care Choice:  NA Choice offered to:  NA  DME Arranged:  N/A DME Agency:  NA  HH Arranged:  NA HH Agency:  NA  Status of Service:  Completed, signed off  If discussed at [place] of Stay Meetings, dates discussed:    Additional Comments:  Erenest Rasher, RN 09/12/2017, 6:07 PM
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
## 2017-09-12 NOTE — Consult Note (Signed)
[place] Nurse wound consult note Reason for Consult:laceration to forehead with 5 stitches intact.  Wound type:trauma from fall Pressure Injury POA: NA Measurement: 6 cm x 0.2 cm suture line Wound OVF:IEPPI are approximated with stitches Drainage (amount, consistency, odor) none noted Periwound:intact  Dressing procedure/placement/frequency:cleanse suture line with soap and water and pat gently dry.  Apply pea sized amount mupirocin ointment to suture line and cover with dry dressing daily.  Will not follow at this time.  Please re-consult if needed.  Domenic Moras RN BSN [place] Pager [phone]

## 2017-09-12 NOTE — Care Management CC44 (Signed)
Condition Code 44 Documentation Completed  Patient Details  Name: Elizabeth [name] MRN: [id number] Date of Birth: [birth year]   Condition Code 44 given:  Yes Patient signature on Condition Code 44 notice:  Yes Documentation of 2 MD's agreement:  Yes Code 44 added to claim:  Yes    Erenest Rasher, RN 09/12/2017, 6:14 PM

## 2017-09-12 NOTE — Care Management Obs Status (Signed)
[facility] NOTIFICATION   Patient Details  Name: Elizabeth [name] MRN: [id number] Date of Birth: 17-Dec-1930   Medicare Observation Status Notification Given:  Yes (delivered copy to room, left notice. contacted nephew, Donella Pascarella # [phone] to explain notice)    Erenest Rasher, RN 09/12/2017, 6:14 PM

## 2017-09-13 DIAGNOSIS — M8580 Other specified disorders of bone density and structure, unspecified site: Secondary | ICD-10-CM | POA: Diagnosis not present

## 2017-09-13 DIAGNOSIS — W19XXXA Unspecified fall, initial encounter: Secondary | ICD-10-CM | POA: Diagnosis not present

## 2017-09-13 DIAGNOSIS — E785 Hyperlipidemia, unspecified: Secondary | ICD-10-CM | POA: Diagnosis not present

## 2017-09-13 DIAGNOSIS — Z8249 Family history of ischemic heart disease and other diseases of the circulatory system: Secondary | ICD-10-CM | POA: Diagnosis not present

## 2017-09-13 DIAGNOSIS — Z23 Encounter for immunization: Secondary | ICD-10-CM | POA: Diagnosis not present

## 2017-09-13 DIAGNOSIS — Z820 Family history of epilepsy and other diseases of the nervous system: Secondary | ICD-10-CM | POA: Diagnosis not present

## 2017-09-13 DIAGNOSIS — S0191XA Laceration without foreign body of unspecified part of head, initial encounter: Secondary | ICD-10-CM | POA: Diagnosis not present

## 2017-09-13 DIAGNOSIS — E538 Deficiency of other specified B group vitamins: Secondary | ICD-10-CM | POA: Diagnosis not present

## 2017-09-13 DIAGNOSIS — Z825 Family history of asthma and other chronic lower respiratory diseases: Secondary | ICD-10-CM | POA: Diagnosis not present

## 2017-09-13 DIAGNOSIS — G2581 Restless legs syndrome: Secondary | ICD-10-CM | POA: Diagnosis not present

## 2017-09-13 DIAGNOSIS — S0181XA Laceration without foreign body of other part of head, initial encounter: Secondary | ICD-10-CM | POA: Diagnosis not present

## 2017-09-13 DIAGNOSIS — N3000 Acute cystitis without hematuria: Secondary | ICD-10-CM | POA: Diagnosis not present

## 2017-09-13 DIAGNOSIS — K589 Irritable bowel syndrome without diarrhea: Secondary | ICD-10-CM | POA: Diagnosis not present

## 2017-09-13 DIAGNOSIS — Z8673 Personal history of transient ischemic attack (TIA), and cerebral infarction without residual deficits: Secondary | ICD-10-CM | POA: Diagnosis not present

## 2017-09-13 DIAGNOSIS — Z803 Family history of malignant neoplasm of breast: Secondary | ICD-10-CM | POA: Diagnosis not present

## 2017-09-13 DIAGNOSIS — Z7982 Long term (current) use of aspirin: Secondary | ICD-10-CM | POA: Diagnosis not present

## 2017-09-13 DIAGNOSIS — G960 Cerebrospinal fluid leak: Secondary | ICD-10-CM | POA: Diagnosis not present

## 2017-09-13 DIAGNOSIS — E039 Hypothyroidism, unspecified: Secondary | ICD-10-CM | POA: Diagnosis not present

## 2017-09-13 DIAGNOSIS — G20 Parkinson's disease: Secondary | ICD-10-CM | POA: Diagnosis not present

## 2017-09-13 LAB — BASIC METABOLIC PANEL
Anion gap: 7 (ref 5–15)
BUN: 6 mg/dL (ref 6–20)
CHLORIDE: 103 mmol/L (ref 101–111)
CO2: 26 mmol/L (ref 22–32)
CREATININE: 0.65 mg/dL (ref 0.44–1.00)
Calcium: 7.9 mg/dL — ABNORMAL LOW (ref 8.9–10.3)
GFR calc Af Amer: >60 mL/min (ref >60)
GFR calc non Af Amer: >60 mL/min (ref >60)
GLUCOSE: 104 mg/dL — AB (ref 65–99)
POTASSIUM: 3.3 mmol/L — AB (ref 3.5–5.1)
Sodium: 136 mmol/L (ref 135–145)

## 2017-09-13 LAB — CBC
HEMATOCRIT: 32 % — AB (ref 36.0–46.0)
HEMOGLOBIN: 11 g/dL — AB (ref 12.0–15.0)
MCH: 28.3 pg (ref 26.0–34.0)
MCHC: 34.4 g/dL (ref 30.0–36.0)
MCV: 82.3 fL (ref 78.0–100.0)
Platelets: 203 10*3/uL (ref 150–400)
RBC: 3.89 MIL/uL (ref 3.87–5.11)
RDW: 15.6 % — ABNORMAL HIGH (ref 11.5–15.5)
WBC: 5 10*3/uL (ref 4.0–10.5)

## 2017-09-13 LAB — MAGNESIUM: MAGNESIUM: 1.7 mg/dL (ref 1.7–2.4)

## 2017-09-13 MED ORDER — POTASSIUM CHLORIDE CRYS ER 20 MEQ PO TBCR
40.0000 meq | EXTENDED_RELEASE_TABLET | Freq: Two times a day (BID) | ORAL | Status: DC
  Administered 2017-09-13 – 2017-09-14 (×3): 40 meq via ORAL
  Filled 2017-09-13 (×3): qty 2

## 2017-09-13 NOTE — NC FL2 (Signed)
[place] LEVEL OF CARE SCREENING TOOL     IDENTIFICATION  Patient Name: Elizabeth [name] Birthdate: 1931-04-30 Sex: female Admission Date (Current Location): 09/11/2017  [facility] and [place] Number:  Herbalist and Address:  [facility]  [place] [address] [place]      Provider Number: [id number]  Attending Physician Name and Address:  Rodena Goldmann, DO  Relative Name and Phone Number:       Current Level of Care: Hospital Recommended Level of Care: [place] Prior Approval Number:    Date Approved/Denied:   PASRR Number:    Discharge Plan: SNF    Current Diagnoses: Patient Active Problem List   Diagnosis Date Noted  . Fall 09/11/2017  . Subdural hygroma 09/11/2017  . Weakness 06/24/2017  . UTI (urinary tract infection) 06/24/2017  . Swallowing disorder 03/04/2017  . Parkinson disease ([place]) 06/25/2016  . Restless leg syndrome 12/25/2015  . Arthritis 12/25/2015  . Abnormality of gait 05/24/2015  . Intention tremor 12/08/2013  . History of CVA (cerebrovascular accident) 07/20/2012  . IRRITABLE BOWEL SYNDROME 01/17/2009  . Cervicalgia 01/17/2009  . Urinary incontinence 09/07/2008  . ANEMIA, B12 DEFICIENCY 04/26/2008  . HYPERLIPIDEMIA 01/26/2008  . History of TIA (transient ischemic attack) 01/26/2008  . Hypothyroidism 01/04/2008  . Osteopenia 01/04/2008    Orientation RESPIRATION BLADDER Height & Weight     Self, Time, Situation, Place  Normal Incontinent Weight: 90 lb (40.8 kg) Height:  4\' 11"  (149.9 cm)  BEHAVIORAL SYMPTOMS/MOOD NEUROLOGICAL BOWEL NUTRITION STATUS      Continent (S) Diet (heart healthy fluid consitency thin)  AMBULATORY STATUS COMMUNICATION OF NEEDS Skin   Limited Assist Verbally Normal                       Personal Care Assistance Level of Assistance  Bathing, Dressing Bathing Assistance: Limited assistance   Dressing Assistance: Limited assistance     Functional  Limitations Info             SPECIAL CARE FACTORS FREQUENCY  PT (By licensed PT), OT (By licensed OT)     PT Frequency: 5 times week OT Frequency: 5 times week            Contractures      Additional Factors Info  Code Status, Allergies Code Status Info: full code Allergies Info: Atorvastatin, Crestor Rosuvastatin Calcium, Vesicare Solifenacin           Current Medications (09/13/2017):  This is the current hospital active medication list Current Facility-Administered Medications  Medication Dose Route Frequency Provider Last Rate Last Dose  . acetaminophen (TYLENOL) tablet 650 mg  650 mg Oral Q6H PRN Ivor Costa, MD       Or  . acetaminophen (TYLENOL) suppository 650 mg  650 mg Rectal Q6H PRN Ivor Costa, MD      . aspirin EC tablet 81 mg  81 mg Oral Daily Ivor Costa, MD   81 mg at 09/13/17 1013  . calcium-vitamin D (OSCAL WITH D) 500-200 MG-UNIT per tablet 1 tablet  1 tablet Oral Daily Ivor Costa, MD   1 tablet at 09/13/17 1013  . cefTRIAXone (ROCEPHIN) 1 g in dextrose 5 % 50 mL IVPB  1 g Intravenous Q24H Ivor Costa, MD   Stopped at 09/12/17 2118  . cholecalciferol (VITAMIN D) tablet 1,000 Units  1,000 Units Oral Daily Ivor Costa, MD   1,000 Units at 09/13/17 1014  . fesoterodine (TOVIAZ)  tablet 4 mg  4 mg Oral Daily Ivor Costa, MD   4 mg at 09/13/17 1013  . levothyroxine (SYNTHROID, LEVOTHROID) tablet 37.5 mcg  37.5 mcg Oral QAC breakfast Ivor Costa, MD   37.5 mcg at 09/13/17 1014  . mupirocin ointment (BACTROBAN) 2 %   Topical Daily Manuella Ghazi, Pratik D, DO      . ondansetron (ZOFRAN) tablet 4 mg  4 mg Oral Q6H PRN Ivor Costa, MD       Or  . ondansetron [facility]) injection 4 mg  4 mg Intravenous Q6H PRN Ivor Costa, MD      . potassium chloride SA (K-DUR,KLOR-CON) CR tablet 40 mEq  40 mEq Oral BID Manuella Ghazi, Pratik D, DO   40 mEq at 09/13/17 1014  . pramipexole (MIRAPEX) tablet 0.125 mg  0.125 mg Oral BID Ivor Costa, MD   0.125 mg at 09/13/17 1013  . simethicone (MYLICON) chewable  tablet 120 mg  120 mg Oral Daily PRN Ivor Costa, MD      . vitamin B-12 (CYANOCOBALAMIN) tablet 1,000 mcg  1,000 mcg Oral Daily Ivor Costa, MD   1,000 mcg at 09/13/17 1014  . zolpidem (AMBIEN) tablet 5 mg  5 mg Oral QHS PRN Ivor Costa, MD   5 mg at 09/12/17 2344     Discharge Medications: Please see discharge summary for a list of discharge medications.  Relevant Imaging Results:  Relevant Lab Results:   Additional Information ss [place], [place]

## 2017-09-13 NOTE — Clinical Social Work Note (Signed)
Clinical Social Work Assessment  Patient Details  Name: Elizabeth [name] MRN: [id number] Date of Birth: 1931-07-11  Date of referral:  09/13/17               Reason for consult:  Facility Placement                Permission sought to share information with:  Facility Sport and exercise psychologist, Family Supports Permission granted to share information::  Yes, Verbal Permission Granted  Name::        Agency::     Relationship::     Contact Information:     Housing/Transportation Living arrangements for the past 2 months:  Single Family Home Source of Information:  Patient, Adult Children, Other (Comment Required) (caretaker and friend lillie [phone]) Patient Interpreter Needed:  None Criminal Activity/Legal Involvement Pertinent to Current Situation/Hospitalization:    Significant Relationships:  Adult Children Lives with:  Self Do you feel safe going back to the place where you live?    Need for family participation in patient care:  Yes (Comment)  Care giving concerns: Caretaker and friend Katy Apo would like SNF to be close to her home stating [facility] closest for her. Family in agreement.   Social Worker assessment / plan: SW met with patient, her son and her caretaker and friend Katy Apo to assess for services.  Patient's family want SNF close to her caretaker and friend Katy Apo.  SW sent patient information through the hub and obtained pasaar number.   Employment status:  Retired Nurse, adult PT Recommendations:  [place] / Referral to community resources:     Patient/Family's Response to care: Patient's son wanted SNF close to his mother's caretaker. [facility] closest. Patient has lived alone and never married but stated that caretaker and friend Katy Apo does help with in home needs for patient  Patient/Family's Understanding of and Emotional Response to Diagnosis, Current Treatment, and Prognosis: Patient and family  hopeful patient will regain her strength  Emotional Assessment Appearance:  Appears stated age Attitude/Demeanor/Rapport:    Affect (typically observed):  Sad Orientation:  Oriented to Self, Oriented to Place, Oriented to  Time, Oriented to Situation Alcohol / Substance use:    Psych involvement (Current and /or in the community):     Discharge Needs  Concerns to be addressed:    Readmission within the last 30 days:  No Current discharge risk:  None Barriers to Discharge:  No Barriers Identified   Carlean Jews, LCSW 09/13/2017, 5:14 PM

## 2017-09-13 NOTE — Progress Notes (Signed)
PROGRESS NOTE    Elizabeth [name]  ZOX:[id number] DOB: 1931/08/17 DOA: 09/11/2017 PCP: Binnie Rail, MD   Brief Narrative:   Elizabeth [name] is a 83 y.o. female with medical history significant of stroke, hypothyroidism, hyperlipidemia, Parkinson's disease, pernicious anemia, RLS, post polio syndrome, who presents with fall, increased urinary frequency. Patient states that he recently she has generalized weakness and frequent fall. She has had at least 6 falls in the past 2 weeks. She did not lose consciousness. She has poor balance, but no unilateral weakness, numbness or tingling to extremities. No facial droop, slurred speech or hearing loss. She was noted to have a small left sided subdural hygroma on head CT which reviewed by Neurosurgery after discussion with ED physician and noted to be stable.  She is noted to have a possible UTI for which she has been started on Rocephin with cultures pending. PT eval recommends SNF placement which she is currently waiting on. Tolerating diet.   Assessment & Plan:   Principal Problem:   Fall Active Problems:   Hypothyroidism   History of CVA (cerebrovascular accident)   Restless leg syndrome   Parkinson disease ([place])   UTI (urinary tract infection)   Subdural hygroma   Fall: patient has frequent fall recently. Likely due to multifactorial etiology, including old age, Parkinson's disease, history of stroke, poor balance and UTI. CT head showed a small subdural hygroma with modest mass effect. EDP (Dr. William Dalton) consulted neurosurgeon on call (ED physician did not write down surgeon's name)-->no surgery needed. Patient lives alone, seems not be able to take care of herself anymore.  -SW working on bed placement -DC IVF -Continue diet -Fall precautions  Subdural hygroma-stable: -see above. No surgery needed per neurosurgeon  Parkinson disease [facility]): pt is not taking her rytary. She said that her Parkinson's disease is at borderline -pt/ot  ongoing  Hypothyroidism: Last TSH was 7.416 today -Continue home Synthroid, blood increase dose from 25-37.5 micrograms -Check TSH in 6 weeks  Hx of stroke -on ASA  Restless leg syndrome: -pramipexole  Possible UTI -Continue Rocephin -UCx pending   DVT ppx: SCD Code Status: Full code Family Communication: Yes, patient's 2 friends  at bed side on 10/19 Disposition Plan:  Anticipate discharge to SNF/Rehab Consults called:  none Admission status: Obs  Consultants:   None   Procedures: None  Antimicrobials: Rocephin 09/11/2017->  Objective: Vitals:   09/12/17 0607 09/12/17 1402 09/12/17 2026 09/13/17 0614  BP: 134/62 (!) 125/52 130/65 134/65  Pulse: 79 79 (!) 106 (!) 101  Resp: 16 16 16 16   Temp: 98.1 F (36.7 C) 98.1 F (36.7 C) 99.2 F (37.3 C) 98.5 F (36.9 C)  TempSrc: Oral Oral Oral Oral  SpO2: 99% 97% 97% 97%  Weight: 40.8 kg (90 lb)     Height: 4\' 11"  (1.499 m)       Intake/Output Summary (Last 24 hours) at 09/13/17 0954 Last data filed at 09/13/17 0600  Gross per 24 hour  Intake             2030 ml  Output             1050 ml  Net              980 ml   Filed Weights   09/12/17 0607  Weight: 40.8 kg (90 lb)    Examination:  General exam: Appears calm and comfortable ; wearing soft/neck collar Respiratory system: Clear to auscultation. Respiratory effort normal. Cardiovascular system:  S1 & S2 heard, RRR. No JVD, murmurs, rubs, gallops or clicks. No pedal edema. Gastrointestinal system: Abdomen is nondistended, soft and nontender. No organomegaly or masses felt. Normal bowel sounds heard. Central nervous system: Alert and oriented. No focal neurological deficits. Extremities: Symmetric 5 x 5 power. Skin: No rashes, lesions or ulcers Psychiatry: Judgement and insight appear normal. Mood & affect appropriate.     Data Reviewed: I have personally reviewed following labs and imaging studies  CBC:  Recent Labs Lab 09/11/17 1738  09/12/17 0502 09/13/17 0540  WBC 5.7 7.5 5.0  NEUTROABS 3.8  --   --   HGB 11.6* 11.3* 11.0*  HCT 34.6* 33.7* 32.0*  MCV 83.8 83.4 82.3  PLT 229 251 563   Basic Metabolic Panel:  Recent Labs Lab 09/11/17 1738 09/11/17 2244 09/12/17 0502 09/13/17 0540  NA 140  --  138 136  K 2.9*  --  3.3* 3.3*  CL 104  --  106 103  CO2 26  --  24 26  GLUCOSE 87  --  105* 104*  BUN 14  --  10 6  CREATININE 0.65  --  0.62 0.65  CALCIUM 8.3*  --  7.7* 7.9*  MG  --  1.5*  --  1.7   GFR: Estimated Creatinine Clearance: 33.7 mL/min (by C-G formula based on SCr of 0.65 mg/dL). Liver Function Tests:  Recent Labs Lab 09/11/17 1738  AST 53*  ALT 54  ALKPHOS 111  BILITOT 1.4*  PROT 5.6*  ALBUMIN 2.8*   No results for input(s): LIPASE, AMYLASE in the last 168 hours. No results for input(s): AMMONIA in the last 168 hours. Coagulation Profile: No results for input(s): INR, PROTIME in the last 168 hours. Cardiac Enzymes: No results for input(s): CKTOTAL, CKMB, CKMBINDEX, TROPONINI in the last 168 hours. BNP (last 3 results) No results for input(s): PROBNP in the last 8760 hours. HbA1C: No results for input(s): HGBA1C in the last 72 hours. CBG: No results for input(s): GLUCAP in the last 168 hours. Lipid Profile: No results for input(s): CHOL, HDL, LDLCALC, TRIG, CHOLHDL, LDLDIRECT in the last 72 hours. Thyroid Function Tests:  Recent Labs  09/11/17 1738  TSH 7.416*   Anemia Panel: No results for input(s): VITAMINB12, FOLATE, FERRITIN, TIBC, IRON, RETICCTPCT in the last 72 hours. Sepsis Labs: No results for input(s): PROCALCITON, LATICACIDVEN in the last 168 hours.  Recent Results (from the past 240 hour(s))  Urine culture     Status: Abnormal (Preliminary result)   Collection Time: 09/11/17  5:49 PM  Result Value Ref Range Status   Specimen Description URINE, RANDOM  Final   Special Requests NONE  Final   Culture >=100,000 COLONIES/mL GRAM NEGATIVE RODS (A)  Final   Report  Status PENDING  Incomplete  Blood culture (routine x 2)     Status: None (Preliminary result)   Collection Time: 09/11/17 10:44 PM  Result Value Ref Range Status   Specimen Description BLOOD LEFT ANTECUBITAL  Final   Special Requests   Final    BOTTLES DRAWN AEROBIC ONLY Blood Culture adequate volume Performed at [facility], [address] [place], [place] [address]    Culture PENDING  Incomplete   Report Status PENDING  Incomplete      Radiology Studies: Ct Head Wo Contrast  Result Date: 09/11/2017 CLINICAL DATA:  Headache and neck pain EXAM: CT HEAD WITHOUT CONTRAST CT CERVICAL SPINE WITHOUT CONTRAST TECHNIQUE: Multidetector CT imaging of the head and cervical spine was  performed following the standard protocol without intravenous contrast. Multiplanar CT image reconstructions of the cervical spine were also generated. COMPARISON:  Head CT November 24, 2014; cervical spine CT May 21, 2012; cervical MRI Mar 27, 2016 FINDINGS: CT HEAD FINDINGS Brain: In comparison with the prior study from 2016, there is a small subdural hygroma on the left which involves frontal, superior temporal, and left parietal lobes. The maximum thickness of this hygroma is noted in the left parietal region measuring 4 mm. There is modest mass effect on the left supratentorial structures without midline shift. No acute appearing hemorrhage is seen intraaxially or extraaxially on this study. There is mild underlying atrophy. There is no intracranial mass. There is patchy small vessel disease throughout the centra semiovale bilaterally. There are lacunar type infarcts in the head of the caudate nucleus as well as in the head of the caudate nucleus on the left. There is also prior infarct in the anterior, medial globus pallidum on the left with involvement of portions of the anterior lentiform nucleus as well as anterior limbs of the internal and external capsules on the left. Basal ganglia calcification is felt to be  physiologic in this age group. There is evidence of a prior infarct in the inferior right occipital lobe, stable. No acute infarct is evident on this study. Vascular: There is no appreciable hyperdense vessel. There is calcification in each carotid siphon region. There is also calcification in each distal vertebral artery. Skull: The bony calvarium appears intact. Sinuses/Orbits: There is mild mucosal thickening in several ethmoid air cells bilaterally. There is mild mucosal thickening in the posteromedial left maxillary antrum. Other visualized paranasal sinuses are clear. Orbits appear symmetric bilaterally. Other: Mastoid air cells are clear. CT CERVICAL SPINE FINDINGS Alignment: There is 3 mm of anterolisthesis of C7 on T1, stable. No new spondylolisthesis. Skull base and vertebrae: Skull base and craniocervical junction regions appear normal. There is no evident fracture. No blastic or lytic bone lesions. Bones appear somewhat osteoporotic. Soft tissues and spinal canal: Prevertebral soft tissues and predental space regions are normal. No paraspinous lesions. No cord or canal hematoma appreciable. Disc levels: There is moderately severe disc space narrowing at C5-6 and C6-7. There is milder narrowing at C3-4, C4-5, and C7-T1. There is multilevel facet osteoarthritic change. Exit foraminal narrowing is most notable on the left at C3-4 due to bony hypertrophy. There is no frank disc extrusion or stenosis. Upper chest: There is scarring in the lung apices, more on the right than on the left, stable. Other: There is atherosclerotic calcification in the carotid arteries. IMPRESSION: CT head: 1. Small left sided subdural hygroma with maximum thickness of the hygroma measured at 4 mm. There is modest mass effect on the left supratentorial structures, but there is no midline shift. No acute hemorrhage evident. 2. There is atrophy with prior right occipital infarct. There are prior infarcts in the left basal ganglia  anteriorly as well as in the head of the caudate nucleus on the right. No acute infarct evident. 3.  Foci of arterial vascular calcification noted. 4.  Mucosal thickening in ethmoid air cells. CT cervical spine: No fracture. Stable mild spondylolisthesis at C7 on T1, felt to be due to underlying spondylosis. There is multilevel osteoarthritic change. There are foci of calcification in each carotid artery. Critical Value/emergent results were called by telephone at the time of interpretation on 09/11/2017 at 1:50 pm to Dr. Milton Ferguson , who verbally acknowledged these results. Electronically Signed   By:  Lowella Grip III M.D.   On: 09/11/2017 13:50   Ct Cervical Spine Wo Contrast  Result Date: 09/11/2017 CLINICAL DATA:  Headache and neck pain EXAM: CT HEAD WITHOUT CONTRAST CT CERVICAL SPINE WITHOUT CONTRAST TECHNIQUE: Multidetector CT imaging of the head and cervical spine was performed following the standard protocol without intravenous contrast. Multiplanar CT image reconstructions of the cervical spine were also generated. COMPARISON:  Head CT November 24, 2014; cervical spine CT May 21, 2012; cervical MRI Mar 27, 2016 FINDINGS: CT HEAD FINDINGS Brain: In comparison with the prior study from 2016, there is a small subdural hygroma on the left which involves frontal, superior temporal, and left parietal lobes. The maximum thickness of this hygroma is noted in the left parietal region measuring 4 mm. There is modest mass effect on the left supratentorial structures without midline shift. No acute appearing hemorrhage is seen intraaxially or extraaxially on this study. There is mild underlying atrophy. There is no intracranial mass. There is patchy small vessel disease throughout the centra semiovale bilaterally. There are lacunar type infarcts in the head of the caudate nucleus as well as in the head of the caudate nucleus on the left. There is also prior infarct in the anterior, medial globus pallidum on  the left with involvement of portions of the anterior lentiform nucleus as well as anterior limbs of the internal and external capsules on the left. Basal ganglia calcification is felt to be physiologic in this age group. There is evidence of a prior infarct in the inferior right occipital lobe, stable. No acute infarct is evident on this study. Vascular: There is no appreciable hyperdense vessel. There is calcification in each carotid siphon region. There is also calcification in each distal vertebral artery. Skull: The bony calvarium appears intact. Sinuses/Orbits: There is mild mucosal thickening in several ethmoid air cells bilaterally. There is mild mucosal thickening in the posteromedial left maxillary antrum. Other visualized paranasal sinuses are clear. Orbits appear symmetric bilaterally. Other: Mastoid air cells are clear. CT CERVICAL SPINE FINDINGS Alignment: There is 3 mm of anterolisthesis of C7 on T1, stable. No new spondylolisthesis. Skull base and vertebrae: Skull base and craniocervical junction regions appear normal. There is no evident fracture. No blastic or lytic bone lesions. Bones appear somewhat osteoporotic. Soft tissues and spinal canal: Prevertebral soft tissues and predental space regions are normal. No paraspinous lesions. No cord or canal hematoma appreciable. Disc levels: There is moderately severe disc space narrowing at C5-6 and C6-7. There is milder narrowing at C3-4, C4-5, and C7-T1. There is multilevel facet osteoarthritic change. Exit foraminal narrowing is most notable on the left at C3-4 due to bony hypertrophy. There is no frank disc extrusion or stenosis. Upper chest: There is scarring in the lung apices, more on the right than on the left, stable. Other: There is atherosclerotic calcification in the carotid arteries. IMPRESSION: CT head: 1. Small left sided subdural hygroma with maximum thickness of the hygroma measured at 4 mm. There is modest mass effect on the left  supratentorial structures, but there is no midline shift. No acute hemorrhage evident. 2. There is atrophy with prior right occipital infarct. There are prior infarcts in the left basal ganglia anteriorly as well as in the head of the caudate nucleus on the right. No acute infarct evident. 3.  Foci of arterial vascular calcification noted. 4.  Mucosal thickening in ethmoid air cells. CT cervical spine: No fracture. Stable mild spondylolisthesis at C7 on T1, felt to be due  to underlying spondylosis. There is multilevel osteoarthritic change. There are foci of calcification in each carotid artery. Critical Value/emergent results were called by telephone at the time of interpretation on 09/11/2017 at 1:50 pm to Dr. Milton Ferguson , who verbally acknowledged these results. Electronically Signed   By: Lowella Grip III M.D.   On: 09/11/2017 13:50     Scheduled Meds: . aspirin EC  81 mg Oral Daily  . calcium-vitamin D  1 tablet Oral Daily  . cholecalciferol  1,000 Units Oral Daily  . fesoterodine  4 mg Oral Daily  . levothyroxine  37.5 mcg Oral QAC breakfast  . mupirocin ointment   Topical Daily  . potassium chloride  40 mEq Oral BID  . pramipexole  0.125 mg Oral BID  . vitamin B-12  1,000 mcg Oral Daily   Continuous Infusions: . cefTRIAXone (ROCEPHIN)  IV Stopped (09/12/17 2118)     LOS: 1 day    Time spent: 30 minutes    Carollyn Etcheverry Darleen Crocker, DO [facility] Pager [phone]  If 7PM-7AM, please contact night-coverage [email or web address] Password [email or web address] 09/13/2017, 9:54 AM

## 2017-09-13 NOTE — Progress Notes (Signed)
Assumed care of this pat. No changes in initial am assessment at this time. Cont with plan of care

## 2017-09-13 NOTE — NC FL2 (Signed)
[place] LEVEL OF CARE SCREENING TOOL     IDENTIFICATION  Patient Name: Elizabeth [name] Birthdate: May 03, 1931 Sex: female Admission Date (Current Location): 09/11/2017  [facility] and [place] Number:  Herbalist and Address:  [facility],  [place], [place]      Provider Number: [id number]  Attending Physician Name and Address:  Rodena Goldmann, DO  Relative Name and Phone Number:       Current Level of Care: Hospital Recommended Level of Care: [place] Prior Approval Number:    Date Approved/Denied:   PASRR Number: [id number]  Discharge Plan: SNF    Current Diagnoses: Patient Active Problem List   Diagnosis Date Noted  . Fall 09/11/2017  . Subdural hygroma 09/11/2017  . Weakness 06/24/2017  . UTI (urinary tract infection) 06/24/2017  . Swallowing disorder 03/04/2017  . Parkinson disease ([place]) 06/25/2016  . Restless leg syndrome 12/25/2015  . Arthritis 12/25/2015  . Abnormality of gait 05/24/2015  . Intention tremor 12/08/2013  . History of CVA (cerebrovascular accident) 07/20/2012  . IRRITABLE BOWEL SYNDROME 01/17/2009  . Cervicalgia 01/17/2009  . Urinary incontinence 09/07/2008  . ANEMIA, B12 DEFICIENCY 04/26/2008  . HYPERLIPIDEMIA 01/26/2008  . History of TIA (transient ischemic attack) 01/26/2008  . Hypothyroidism 01/04/2008  . Osteopenia 01/04/2008    Orientation RESPIRATION BLADDER Height & Weight     Self, Time, Situation, Place  Normal Incontinent Weight: 90 lb (40.8 kg) Height:  4\' 11"  (149.9 cm)  BEHAVIORAL SYMPTOMS/MOOD NEUROLOGICAL BOWEL NUTRITION STATUS      Continent (S) Diet (heart healthy fluid consitency thin)  AMBULATORY STATUS COMMUNICATION OF NEEDS Skin   Limited Assist Verbally Normal                       Personal Care Assistance Level of Assistance  Bathing, Dressing Bathing Assistance: Limited assistance   Dressing Assistance: Limited assistance      Functional Limitations Info             SPECIAL CARE FACTORS FREQUENCY  PT (By licensed PT), OT (By licensed OT)     PT Frequency: 5 times week OT Frequency: 5 times week            Contractures      Additional Factors Info  Code Status, Allergies Code Status Info: full code Allergies Info: Atorvastatin, Crestor Rosuvastatin Calcium, Vesicare Solifenacin           Current Medications (09/13/2017):  This is the current hospital active medication list Current Facility-Administered Medications  Medication Dose Route Frequency Provider Last Rate Last Dose  . acetaminophen (TYLENOL) tablet 650 mg  650 mg Oral Q6H PRN Ivor Costa, MD       Or  . acetaminophen (TYLENOL) suppository 650 mg  650 mg Rectal Q6H PRN Ivor Costa, MD      . aspirin EC tablet 81 mg  81 mg Oral Daily Ivor Costa, MD   81 mg at 09/13/17 1013  . calcium-vitamin D (OSCAL WITH D) 500-200 MG-UNIT per tablet 1 tablet  1 tablet Oral Daily Ivor Costa, MD   1 tablet at 09/13/17 1013  . cefTRIAXone (ROCEPHIN) 1 g in dextrose 5 % 50 mL IVPB  1 g Intravenous Q24H Ivor Costa, MD   Stopped at 09/12/17 2118  . cholecalciferol (VITAMIN D) tablet 1,000 Units  1,000 Units Oral Daily Ivor Costa, MD   1,000 Units at 09/13/17 1014  . fesoterodine (TOVIAZ) tablet  4 mg  4 mg Oral Daily Ivor Costa, MD   4 mg at 09/13/17 1013  . levothyroxine (SYNTHROID, LEVOTHROID) tablet 37.5 mcg  37.5 mcg Oral QAC breakfast Ivor Costa, MD   37.5 mcg at 09/13/17 1014  . mupirocin ointment (BACTROBAN) 2 %   Topical Daily Manuella Ghazi, Pratik D, DO      . ondansetron (ZOFRAN) tablet 4 mg  4 mg Oral Q6H PRN Ivor Costa, MD       Or  . ondansetron [facility]) injection 4 mg  4 mg Intravenous Q6H PRN Ivor Costa, MD      . potassium chloride SA (K-DUR,KLOR-CON) CR tablet 40 mEq  40 mEq Oral BID Manuella Ghazi, Pratik D, DO   40 mEq at 09/13/17 1014  . pramipexole (MIRAPEX) tablet 0.125 mg  0.125 mg Oral BID Ivor Costa, MD   0.125 mg at 09/13/17 1013  . simethicone  (MYLICON) chewable tablet 120 mg  120 mg Oral Daily PRN Ivor Costa, MD      . vitamin B-12 (CYANOCOBALAMIN) tablet 1,000 mcg  1,000 mcg Oral Daily Ivor Costa, MD   1,000 mcg at 09/13/17 1014  . zolpidem (AMBIEN) tablet 5 mg  5 mg Oral QHS PRN Ivor Costa, MD   5 mg at 09/12/17 2344     Discharge Medications: Please see discharge summary for a list of discharge medications.  Relevant Imaging Results:  Relevant Lab Results:   Additional Information ss [place], [place]

## 2017-09-13 NOTE — Clinical Social Work Note (Signed)
SW met with patient's care giver Katy Apo [phone]).  She wants a SNF close to her home so she can contunie to help care for patient while she is rehab.  Please call her with bed options tomorrow.  Dede Query, LCSW [place] Worker - Weekend Coverage cell #: [phone]

## 2017-09-13 NOTE — Progress Notes (Signed)
Nutrition Brief Note  Patient identified on the Malnutrition Screening Tool (MST) Report  Pt with stable weight in 2018. Pt reports good appetite and ate most of her breakfast tray this morning and was still eating when RD visited. Per visitor in room, pt goes out to eat at places like [place] and has eaten well for her age.  Pt declines any supplements at this time. No issues swallowing or chewing.  Wt Readings from Last 15 Encounters:  09/12/17 90 lb (40.8 kg)  06/24/17 91 lb (41.3 kg)  03/04/17 93 lb 1.9 oz (42.2 kg)  01/08/17 94 lb (42.6 kg)  09/18/16 95 lb 3 oz (43.2 kg)  08/06/16 97 lb (44 kg)  04/24/16 99 lb (44.9 kg)  03/13/16 94 lb (42.6 kg)  02/20/16 94 lb (42.6 kg)  12/25/15 97 lb (44 kg)  09/13/15 97 lb (44 kg)  06/07/15 99 lb (44.9 kg)  05/24/15 99 lb (44.9 kg)  11/15/14 99 lb 6 oz (45.1 kg)  05/18/14 99 lb 12.8 oz (45.3 kg)    Body mass index is 18.18 kg/m. Patient meets criteria for underweight based on current BMI.   Current diet order is heart Healthy, patient is consuming approximately 75% of meals at this time. Labs and medications reviewed.   No nutrition interventions warranted at this time. If nutrition issues arise, please consult RD.   Clayton Bibles, MS, RD, [place] Dietitian Pager: [phone] After Hours Pager: [phone]

## 2017-09-14 ENCOUNTER — Inpatient Hospital Stay: Payer: Medicare Other | Admitting: Internal Medicine

## 2017-09-14 DIAGNOSIS — S0181XA Laceration without foreign body of other part of head, initial encounter: Secondary | ICD-10-CM | POA: Diagnosis not present

## 2017-09-14 DIAGNOSIS — Z825 Family history of asthma and other chronic lower respiratory diseases: Secondary | ICD-10-CM | POA: Diagnosis not present

## 2017-09-14 DIAGNOSIS — W19XXXD Unspecified fall, subsequent encounter: Secondary | ICD-10-CM | POA: Diagnosis not present

## 2017-09-14 DIAGNOSIS — Z820 Family history of epilepsy and other diseases of the nervous system: Secondary | ICD-10-CM | POA: Diagnosis not present

## 2017-09-14 DIAGNOSIS — N3000 Acute cystitis without hematuria: Secondary | ICD-10-CM | POA: Diagnosis not present

## 2017-09-14 DIAGNOSIS — N390 Urinary tract infection, site not specified: Secondary | ICD-10-CM | POA: Diagnosis not present

## 2017-09-14 DIAGNOSIS — R2681 Unsteadiness on feet: Secondary | ICD-10-CM | POA: Diagnosis not present

## 2017-09-14 DIAGNOSIS — W19XXXA Unspecified fall, initial encounter: Secondary | ICD-10-CM | POA: Diagnosis present

## 2017-09-14 DIAGNOSIS — G960 Cerebrospinal fluid leak: Secondary | ICD-10-CM | POA: Diagnosis not present

## 2017-09-14 DIAGNOSIS — Z8249 Family history of ischemic heart disease and other diseases of the circulatory system: Secondary | ICD-10-CM | POA: Diagnosis not present

## 2017-09-14 DIAGNOSIS — Z803 Family history of malignant neoplasm of breast: Secondary | ICD-10-CM | POA: Diagnosis not present

## 2017-09-14 DIAGNOSIS — M6281 Muscle weakness (generalized): Secondary | ICD-10-CM | POA: Diagnosis not present

## 2017-09-14 DIAGNOSIS — N3090 Cystitis, unspecified without hematuria: Secondary | ICD-10-CM | POA: Diagnosis not present

## 2017-09-14 DIAGNOSIS — Z8673 Personal history of transient ischemic attack (TIA), and cerebral infarction without residual deficits: Secondary | ICD-10-CM | POA: Diagnosis not present

## 2017-09-14 DIAGNOSIS — E039 Hypothyroidism, unspecified: Secondary | ICD-10-CM | POA: Diagnosis not present

## 2017-09-14 DIAGNOSIS — S0191XA Laceration without foreign body of unspecified part of head, initial encounter: Secondary | ICD-10-CM | POA: Diagnosis present

## 2017-09-14 DIAGNOSIS — G2581 Restless legs syndrome: Secondary | ICD-10-CM | POA: Diagnosis not present

## 2017-09-14 DIAGNOSIS — E538 Deficiency of other specified B group vitamins: Secondary | ICD-10-CM | POA: Diagnosis not present

## 2017-09-14 DIAGNOSIS — Z23 Encounter for immunization: Secondary | ICD-10-CM | POA: Diagnosis not present

## 2017-09-14 DIAGNOSIS — E785 Hyperlipidemia, unspecified: Secondary | ICD-10-CM | POA: Diagnosis not present

## 2017-09-14 DIAGNOSIS — D510 Vitamin B12 deficiency anemia due to intrinsic factor deficiency: Secondary | ICD-10-CM | POA: Diagnosis not present

## 2017-09-14 DIAGNOSIS — G8911 Acute pain due to trauma: Secondary | ICD-10-CM | POA: Diagnosis not present

## 2017-09-14 DIAGNOSIS — K589 Irritable bowel syndrome without diarrhea: Secondary | ICD-10-CM | POA: Diagnosis not present

## 2017-09-14 DIAGNOSIS — Z7982 Long term (current) use of aspirin: Secondary | ICD-10-CM | POA: Diagnosis not present

## 2017-09-14 DIAGNOSIS — Z9181 History of falling: Secondary | ICD-10-CM | POA: Diagnosis not present

## 2017-09-14 DIAGNOSIS — G20 Parkinson's disease: Secondary | ICD-10-CM | POA: Diagnosis not present

## 2017-09-14 DIAGNOSIS — B961 Klebsiella pneumoniae [K. pneumoniae] as the cause of diseases classified elsewhere: Secondary | ICD-10-CM | POA: Diagnosis not present

## 2017-09-14 DIAGNOSIS — R488 Other symbolic dysfunctions: Secondary | ICD-10-CM | POA: Diagnosis not present

## 2017-09-14 DIAGNOSIS — M8580 Other specified disorders of bone density and structure, unspecified site: Secondary | ICD-10-CM | POA: Diagnosis not present

## 2017-09-14 DIAGNOSIS — R1312 Dysphagia, oropharyngeal phase: Secondary | ICD-10-CM | POA: Diagnosis not present

## 2017-09-14 LAB — CBC
HCT: 30 % — ABNORMAL LOW (ref 36.0–46.0)
HEMOGLOBIN: 10 g/dL — AB (ref 12.0–15.0)
MCH: 27.5 pg (ref 26.0–34.0)
MCHC: 33.3 g/dL (ref 30.0–36.0)
MCV: 82.6 fL (ref 78.0–100.0)
PLATELETS: 208 10*3/uL (ref 150–400)
RBC: 3.63 MIL/uL — AB (ref 3.87–5.11)
RDW: 15.7 % — ABNORMAL HIGH (ref 11.5–15.5)
WBC: 4.8 10*3/uL (ref 4.0–10.5)

## 2017-09-14 LAB — URINE CULTURE: Culture: >=100000 — AB

## 2017-09-14 LAB — BASIC METABOLIC PANEL
Anion gap: 5 (ref 5–15)
BUN: 6 mg/dL (ref 6–20)
CHLORIDE: 106 mmol/L (ref 101–111)
CO2: 25 mmol/L (ref 22–32)
CREATININE: 0.65 mg/dL (ref 0.44–1.00)
Calcium: 7.9 mg/dL — ABNORMAL LOW (ref 8.9–10.3)
Glucose, Bld: 103 mg/dL — ABNORMAL HIGH (ref 65–99)
Potassium: 4.2 mmol/L (ref 3.5–5.1)
SODIUM: 136 mmol/L (ref 135–145)

## 2017-09-14 MED ORDER — POLYETHYLENE GLYCOL 3350 17 G PO PACK
17.0000 g | PACK | Freq: Every day | ORAL | Status: DC
  Administered 2017-09-14: 17 g via ORAL
  Filled 2017-09-14: qty 1

## 2017-09-14 MED ORDER — LEVOTHYROXINE SODIUM 75 MCG PO TABS: 37.5000 ug | ORAL_TABLET | Freq: Every day | ORAL | Status: DC

## 2017-09-14 MED ORDER — CEPHALEXIN 500 MG PO CAPS: 500.0000 mg | ORAL_CAPSULE | Freq: Two times a day (BID) | ORAL | Status: DC

## 2017-09-14 MED ORDER — POLYETHYLENE GLYCOL 3350 17 G PO PACK: 17.0000 g | PACK | Freq: Every day | ORAL | 0 refills | Status: DC | PRN

## 2017-09-14 MED ORDER — CEPHALEXIN 500 MG PO CAPS: 500.0000 mg | ORAL_CAPSULE | Freq: Two times a day (BID) | ORAL | 0 refills | Status: AC

## 2017-09-14 NOTE — Progress Notes (Signed)
OT Cancellation Note  Patient Details Name: Elizabeth [name] MRN: [id number] DOB: 1931-08-24   Cancelled Treatment:    Reason Eval/Treat Not Completed: Other (comment)  Noted pt has been Dced and plan is SNF- will defer OT eval to SNF [facility], [place]  [name] 09/14/2017, 12:44 PM

## 2017-09-14 NOTE — Progress Notes (Signed)
Pt dsicharged to [employer], report called to [employer]. Pt in stable condition. SRP, RN

## 2017-09-14 NOTE — Clinical Social Work Placement (Signed)
Patient received and accepted bed offer at [facility]. Facility aware of discharge and confirmed patient's bed offer. PTAR contacted, family notified. Patient's RN can call report to [phone], packet complete. CSW signing off, no other needs identified at this time.  CLINICAL SOCIAL WORK PLACEMENT  NOTE  Date:  09/14/2017  Patient Details  Name: Elizabeth [name] MRN: [id number] Date of Birth: Apr 13, 1931  Clinical Social Work is seeking post-discharge placement for this patient at the [place] level of care (*CSW will initial, date and re-position this form in  chart as items are completed):  Yes   Patient/family provided with [place] Work Department's list of facilities offering this level of care within the geographic area requested by the patient (or if unable, by the patient's family).  Yes   Patient/family informed of their freedom to choose among providers that offer the needed level of care, that participate in Medicare, Medicaid or managed care program needed by the patient, have an available bed and are willing to accept the patient.  Yes   Patient/family informed of [facility]'s ownership interest in [facility] and [facility], as well as of the fact that they are under no obligation to receive care at these facilities.  PASRR submitted to EDS on       PASRR number received on       Existing PASRR number confirmed on       FL2 transmitted to all facilities in geographic area requested by pt/family on 09/13/17     FL2 transmitted to all facilities within larger geographic area on       Patient informed that his/her managed care company has contracts with or will negotiate with certain facilities, including the following:        Yes   Patient/family informed of bed offers received.  Patient chooses bed at [facility]     Physician recommends and patient chooses bed at      Patient to be  transferred to [facility] on 09/14/17.  Patient to be transferred to facility by PTAR     Patient family notified on 09/14/17 of transfer.  Name of family member notified:  Lujean Rave     PHYSICIAN       Additional Comment:    _______________________________________________ Burnis Medin, LCSW 09/14/2017, 2:40 PM

## 2017-09-14 NOTE — Discharge Summary (Addendum)
Physician Discharge Summary  Tranisha Tissue Martino CZY:[id number] DOB: Jun 19, 1931 DOA: 09/11/2017  PCP: Binnie Rail, MD  Admit date: 09/11/2017 Discharge date: 09/14/2017  Admitted From:home Disposition:SNF  Recommendations for Outpatient Follow-up:  1. Follow up with PCP in 1-2 weeks 2. Please obtain BMP/CBC in one week   Home Health:SNF Equipment/Devices:none Discharge Condition:stable CODE STATUS:full code Diet recommendation:heart healthy  Brief/Interim Summary: 83 y.o.femalewith medical history significant of stroke, hypothyroidism, hyperlipidemia, Parkinson's disease, pernicious anemia, RLS, post polio syndrome, who presents with fall, increased urinary frequency. Patient with gradually worsening weakness and frequent falls. Patient had at least 6 falls in the past 2 weeks prior to admission. She did not lose consciousness. She was noted to have a small left sided subdural hygroma on head CT which reviewed by Neurosurgery after discussion with ED physician and noted to be stable. Patient was found to have Klebsiella UTI. Treated with IV ceftriaxone and plan to discharge with oral antibiotics. Patient is clinically improved.  Frequent falls at home is likely multifactorial etiology, including old age, Parkinson's disease, history of stroke, poor balance and UTI. CT head showed a small subdural hygroma withmodest mass effect. EDP (Dr. William Dalton) consulted neurosurgeon on call, no surgery needed. Evaluated by PT OT and plan to discharge to skilled nursing facility for further care.  Subdural hygroma: Stable. No surgery needed as the neurosurgeon.  Parkinson disease: Follow up with PCP. Continue current medications  Hypothyroidism: TSH elevated. The dose of Synthroid increased. Recommended to check TSH in 4-6 weeks  Possible urinary tract infection likely acute cystitis without hematuria: Urine culture growing Klebsiella. Plan to discharge with Keflex.   Discharge Diagnoses:  Principal  Problem:   Fall Active Problems:   Hypothyroidism   History of CVA (cerebrovascular accident)   Restless leg syndrome   Parkinson disease ([place])   UTI (urinary tract infection)   Subdural hygroma    Discharge Instructions  Discharge Instructions    Call MD for:  difficulty breathing, headache or visual disturbances    Complete by:  As directed    Call MD for:  hives    Complete by:  As directed    Call MD for:  persistant dizziness or light-headedness    Complete by:  As directed    Call MD for:  persistant nausea and vomiting    Complete by:  As directed    Call MD for:  severe uncontrolled pain    Complete by:  As directed    Call MD for:  temperature >100.4    Complete by:  As directed    Diet - low sodium heart healthy    Complete by:  As directed    Increase activity slowly    Complete by:  As directed      Allergies as of 09/14/2017      Reactions   Atorvastatin    REACTION: Felt funny (only way patient could describe)   Crestor [rosuvastatin Calcium]    Leg cramps, muscle aches   Vesicare [solifenacin]    constipation      Medication List    STOP taking these medications   Carbidopa-Levodopa ER 36.25-145 MG Cpcr Commonly known as:  RYTARY     TAKE these medications   aspirin 81 MG tablet Take 81 mg by mouth daily.   CALCIUM + D PO Take 1 tablet by mouth daily. Reported on 12/25/2015   cephALEXin 500 MG capsule Commonly known as:  KEFLEX Take 1 capsule (500 mg total) by mouth every 12 (  twelve) hours.   cholecalciferol 1000 units tablet Commonly known as:  VITAMIN D Take 1,000 Units by mouth daily.   GAS-X EXTRA STRENGTH 125 MG Caps Generic drug:  Simethicone Take 125 mg by mouth daily as needed (gas).   levothyroxine 75 MCG tablet Commonly known as:  SYNTHROID, LEVOTHROID Take 0.5 tablets (37.5 mcg total) by mouth daily before breakfast. What changed:  medication strength  See the new instructions.   polyethylene glycol  packet Commonly known as:  MIRALAX / GLYCOLAX Take 17 g by mouth daily as needed.   pramipexole 0.125 MG tablet Commonly known as:  MIRAPEX Take 1 tablet (0.125 mg total) by mouth 2 (two) times daily. What changed:  when to take this   ROLLATOR ULTRA-LIGHT Misc Use as directed   tolterodine 4 MG 24 hr capsule Commonly known as:  DETROL LA Take 4 mg by mouth daily.   vitamin B-12 1000 MCG tablet Commonly known as:  CYANOCOBALAMIN Take 1 tablet (1,000 mcg total) by mouth daily. What changed:  how much to take       Contact information for follow-up providers    Binnie Rail, MD. Schedule an appointment as soon as possible for a visit in 1 week(s).   Specialty:  Internal Medicine Contact information: [place] [address] [phone]            Contact information for after-discharge care    Destination    [facility] .   Specialty:  [place] information: [place] [phone]                 Allergies  Allergen Reactions  . Atorvastatin     REACTION: Felt funny (only way patient could describe)  . Crestor [Rosuvastatin Calcium]     Leg cramps, muscle aches  . Vesicare [Solifenacin]     constipation    Consultations: None  Procedures/Studies: None  Subjective: Seen and examined at bedside. Denied headache, dizziness, nausea, vomiting, chest pressure shortness of breath.  Discharge Exam: Vitals:   09/14/17 0635 09/14/17 1227  BP: (!) 124/52 (!) 109/52  Pulse: 84 89  Resp: 18 18  Temp: 98.3 F (36.8 C) 98 F (36.7 C)  SpO2: 95% 100%   Vitals:   09/13/17 1437 09/13/17 2140 09/14/17 0635 09/14/17 1227  BP: 117/71 138/69 (!) 124/52 (!) 109/52  Pulse: 85 96 84 89  Resp: 18 18 18 18   Temp: 98.5 F (36.9 C) 98.6 F (37 C) 98.3 F (36.8 C) 98 F (36.7 C)  TempSrc: Oral Oral Oral Oral  SpO2: 97% 98% 95% 100%  Weight:      Height:         General: Pt is alert, awake, not in acute distress Cardiovascular: RRR, S1/S2 +, no rubs, no gallops Respiratory: CTA bilaterally, no wheezing, no rhonchi Abdominal: Soft, NT, ND, bowel sounds + Extremities: no edema, no cyanosis    The results of significant diagnostics from this hospitalization (including imaging, microbiology, ancillary and laboratory) are listed below for reference.     Microbiology: Recent Results (from the past 240 hour(s))  Urine culture     Status: Abnormal   Collection Time: 09/11/17  5:49 PM  Result Value Ref Range Status   Specimen Description URINE, RANDOM  Final   Special Requests NONE  Final   Culture >=100,000 COLONIES/mL KLEBSIELLA PNEUMONIAE (A)  Final   Report Status 09/14/2017 FINAL  Final  Organism ID, Bacteria KLEBSIELLA PNEUMONIAE (A)  Final      Susceptibility   Klebsiella pneumoniae - MIC*    AMPICILLIN >=32 RESISTANT Resistant     CEFAZOLIN <=4 SENSITIVE Sensitive     CEFTRIAXONE <=1 SENSITIVE Sensitive     CIPROFLOXACIN <=0.25 SENSITIVE Sensitive     GENTAMICIN <=1 SENSITIVE Sensitive     IMIPENEM 0.5 SENSITIVE Sensitive     NITROFURANTOIN 64 INTERMEDIATE Intermediate     TRIMETH/SULFA <=20 SENSITIVE Sensitive     AMPICILLIN/SULBACTAM 4 SENSITIVE Sensitive     PIP/TAZO <=4 SENSITIVE Sensitive     Extended ESBL NEGATIVE Sensitive     * >=100,000 COLONIES/mL KLEBSIELLA PNEUMONIAE  Blood culture (routine x 2)     Status: None (Preliminary result)   Collection Time: 09/11/17 10:44 PM  Result Value Ref Range Status   Specimen Description BLOOD BLOOD LEFT FOREARM  Final   Special Requests   Final    BOTTLES DRAWN AEROBIC ONLY Blood Culture adequate volume   Culture   Final    NO GROWTH 2 DAYS Performed at [facility], [address] [place], [place] [address]    Report Status PENDING  Incomplete  Blood culture (routine x 2)     Status: None (Preliminary result)   Collection Time: 09/11/17 10:44 PM  Result Value Ref  Range Status   Specimen Description BLOOD LEFT ANTECUBITAL  Final   Special Requests   Final    BOTTLES DRAWN AEROBIC ONLY Blood Culture adequate volume   Culture   Final    NO GROWTH 2 DAYS Performed at [facility], [place] [address], [place], [place] [address]    Report Status PENDING  Incomplete     Labs: BNP (last 3 results) No results for input(s): BNP in the last 8760 hours. Basic Metabolic Panel:  Recent Labs Lab 09/11/17 1738 09/11/17 2244 09/12/17 0502 09/13/17 0540 09/14/17 0454  NA 140  --  138 136 136  K 2.9*  --  3.3* 3.3* 4.2  CL 104  --  106 103 106  CO2 26  --  24 26 25   GLUCOSE 87  --  105* 104* 103*  BUN 14  --  10 6 6   CREATININE 0.65  --  0.62 0.65 0.65  CALCIUM 8.3*  --  7.7* 7.9* 7.9*  MG  --  1.5*  --  1.7  --    Liver Function Tests:  Recent Labs Lab 09/11/17 1738  AST 53*  ALT 54  ALKPHOS 111  BILITOT 1.4*  PROT 5.6*  ALBUMIN 2.8*   No results for input(s): LIPASE, AMYLASE in the last 168 hours. No results for input(s): AMMONIA in the last 168 hours. CBC:  Recent Labs Lab 09/11/17 1738 09/12/17 0502 09/13/17 0540 09/14/17 0454  WBC 5.7 7.5 5.0 4.8  NEUTROABS 3.8  --   --   --   HGB 11.6* 11.3* 11.0* 10.0*  HCT 34.6* 33.7* 32.0* 30.0*  MCV 83.8 83.4 82.3 82.6  PLT 229 251 203 208   Cardiac Enzymes: No results for input(s): CKTOTAL, CKMB, CKMBINDEX, TROPONINI in the last 168 hours. BNP: Invalid input(s): POCBNP CBG: No results for input(s): GLUCAP in the last 168 hours. D-Dimer No results for input(s): DDIMER in the last 72 hours. Hgb A1c No results for input(s): HGBA1C in the last 72 hours. Lipid Profile No results for input(s): CHOL, HDL, LDLCALC, TRIG, CHOLHDL, LDLDIRECT in the last 72 hours. Thyroid function studies  Recent Labs  09/11/17 1738  TSH 7.416*   Anemia work up No results for input(s): VITAMINB12, FOLATE, FERRITIN, TIBC, IRON, RETICCTPCT in the last 72 hours. Urinalysis    Component Value  Date/Time   COLORURINE YELLOW 09/11/2017 1749   APPEARANCEUR HAZY (A) 09/11/2017 1749   LABSPEC 1.018 09/11/2017 1749   PHURINE 5.0 09/11/2017 1749   GLUCOSEU NEGATIVE 09/11/2017 1749   GLUCOSEU NEGATIVE 11/15/2014 1537   HGBUR SMALL (A) 09/11/2017 1749   BILIRUBINUR NEGATIVE 09/11/2017 1749   KETONESUR 20 (A) 09/11/2017 1749   PROTEINUR NEGATIVE 09/11/2017 1749   UROBILINOGEN 0.2 11/15/2014 1537   NITRITE POSITIVE (A) 09/11/2017 1749   LEUKOCYTESUR TRACE (A) 09/11/2017 1749   Sepsis Labs Invalid input(s): PROCALCITONIN,  WBC,  LACTICIDVEN Microbiology Recent Results (from the past 240 hour(s))  Urine culture     Status: Abnormal   Collection Time: 09/11/17  5:49 PM  Result Value Ref Range Status   Specimen Description URINE, RANDOM  Final   Special Requests NONE  Final   Culture >=100,000 COLONIES/mL KLEBSIELLA PNEUMONIAE (A)  Final   Report Status 09/14/2017 FINAL  Final   Organism ID, Bacteria KLEBSIELLA PNEUMONIAE (A)  Final      Susceptibility   Klebsiella pneumoniae - MIC*    AMPICILLIN >=32 RESISTANT Resistant     CEFAZOLIN <=4 SENSITIVE Sensitive     CEFTRIAXONE <=1 SENSITIVE Sensitive     CIPROFLOXACIN <=0.25 SENSITIVE Sensitive     GENTAMICIN <=1 SENSITIVE Sensitive     IMIPENEM 0.5 SENSITIVE Sensitive     NITROFURANTOIN 64 INTERMEDIATE Intermediate     TRIMETH/SULFA <=20 SENSITIVE Sensitive     AMPICILLIN/SULBACTAM 4 SENSITIVE Sensitive     PIP/TAZO <=4 SENSITIVE Sensitive     Extended ESBL NEGATIVE Sensitive     * >=100,000 COLONIES/mL KLEBSIELLA PNEUMONIAE  Blood culture (routine x 2)     Status: None (Preliminary result)   Collection Time: 09/11/17 10:44 PM  Result Value Ref Range Status   Specimen Description BLOOD BLOOD LEFT FOREARM  Final   Special Requests   Final    BOTTLES DRAWN AEROBIC ONLY Blood Culture adequate volume   Culture   Final    NO GROWTH 2 DAYS Performed at [facility], [address] [place], [place] [address]    Report  Status PENDING  Incomplete  Blood culture (routine x 2)     Status: None (Preliminary result)   Collection Time: 09/11/17 10:44 PM  Result Value Ref Range Status   Specimen Description BLOOD LEFT ANTECUBITAL  Final   Special Requests   Final    BOTTLES DRAWN AEROBIC ONLY Blood Culture adequate volume   Culture   Final    NO GROWTH 2 DAYS Performed at [facility], [place] [address] [place], [place] [address]    Report Status PENDING  Incomplete     Time coordinating discharge:30 minutes  SIGNED:   Rosita Fire, MD  [facility] 09/14/2017, 3:40 PM  If 7PM-7AM, please contact night-coverage [email or web address] Password [email or web address]

## 2017-09-14 NOTE — Clinical Social Work Placement (Signed)
   CLINICAL SOCIAL WORK PLACEMENT  NOTE  Date:  09/14/2017  Patient Details  Name: Elizabeth [name] MRN: [id number] Date of Birth: 28-May-1931  Clinical Social Work is seeking post-discharge placement for this patient at the [place] level of care (*CSW will initial, date and re-position this form in  chart as items are completed):  Yes   Patient/family provided with [place] Work Department's list of facilities offering this level of care within the geographic area requested by the patient (or if unable, by the patient's family).  Yes   Patient/family informed of their freedom to choose among providers that offer the needed level of care, that participate in Medicare, Medicaid or managed care program needed by the patient, have an available bed and are willing to accept the patient.  Yes   Patient/family informed of [facility]'s ownership interest in [facility] and [facility], as well as of the fact that they are under no obligation to receive care at these facilities.  PASRR submitted to EDS on       PASRR number received on       Existing PASRR number confirmed on       FL2 transmitted to all facilities in geographic area requested by pt/family on 09/13/17     FL2 transmitted to all facilities within larger geographic area on       Patient informed that his/her managed care company has contracts with or will negotiate with certain facilities, including the following:        Yes   Patient/family informed of bed offers received.  Patient chooses bed at       Physician recommends and patient chooses bed at      Patient to be transferred to   on  .  Patient to be transferred to facility by       Patient family notified on   of transfer.  Name of family member notified:        PHYSICIAN       Additional Comment:    _______________________________________________ Burnis Medin, LCSW 09/14/2017, 11:32 AM

## 2017-09-14 NOTE — Progress Notes (Signed)
Physical Therapy Treatment Patient Details Name: Elizabeth [name] MRN: [id number] DOB: 11-Jul-1931 Today's Date: 09/14/2017    History of Present Illness Elizabeth [name] is a 83 y.o. female with medical history significant of stroke, hypothyroidism, hyperlipidemia, Parkinson's disease, pernicious anemia, RLS, post polio syndrome, who presents with fall, increased urinary frequency    PT Comments    The patient is able to ambulate a short distance today with Rw. Plans SNF.   Follow Up Recommendations  SNF     Equipment Recommendations  None recommended by PT    Recommendations for Other Services       Precautions / Restrictions Precautions Precautions: Fall Required Braces or Orthoses: Cervical Brace Cervical Brace: Soft collar    Mobility  Bed Mobility               General bed mobility comments: in recliner  Transfers Overall transfer level: Needs assistance Equipment used: Rolling walker (2 wheeled) Transfers: Sit to/from Stand Sit to Stand: Max assist;Mod assist         General transfer comment: max assist to steady once standing, need steady assist until able to  get trunk over feet.  cues for reaching back  Ambulation/Gait Ambulation/Gait assistance: Max assist;Mod assist Ambulation Distance (Feet): 15 Feet (x 2) Assistive device: Rolling walker (2 wheeled) Gait Pattern/deviations: Step-to pattern;Staggering left;Staggering right;Trunk flexed     General Gait Details: severe trunk flexion  when standing. patient did not wear neck brace- declined.  assist to steady when turning around asnd backing up.    Stairs            Wheelchair Mobility    Modified Rankin (Stroke Patients Only)       Balance                                            Cognition Arousal/Alertness: Awake/alert                                            Exercises      General Comments        Pertinent Vitals/Pain Pain  Assessment: No/denies pain    Home Living                      Prior Function            PT Goals (current goals can now be found in the care plan section) Progress towards PT goals: Progressing toward goals    Frequency    Min 2X/week      PT Plan Current plan remains appropriate    Co-evaluation              AM-PAC PT "6 Clicks" Daily Activity  Outcome Measure  Difficulty turning over in bed (including adjusting bedclothes, sheets and blankets)?: Unable Difficulty moving from lying on back to sitting on the side of the bed? : Unable Difficulty sitting down on and standing up from a chair with arms (e.g., wheelchair, bedside commode, etc,.)?: Unable Help needed moving to and from a bed to chair (including a wheelchair)?: Total Help needed walking in hospital room?: Total Help needed climbing 3-5 steps with a railing? : Total 6 Click Score: 6    End of Session Equipment  Utilized During Treatment: Gait belt Activity Tolerance: Patient limited by fatigue Patient left: in chair;with call bell/phone within reach;with chair alarm set;with family/visitor present Nurse Communication: Mobility status PT Visit Diagnosis: Unsteadiness on feet (R26.81);History of falling (Z91.81)     Time: 5625-6389 PT Time Calculation (min) (ACUTE ONLY): 17 min  Charges:  $Gait Training: 8-22 mins                    G CodesTresa Endo PT [phone]    Claretha Cooper 09/14/2017, 1:29 PM

## 2017-09-15 ENCOUNTER — Encounter: Payer: Self-pay | Admitting: Internal Medicine

## 2017-09-15 ENCOUNTER — Non-Acute Institutional Stay (SKILLED_NURSING_FACILITY): Payer: Medicare Other | Admitting: Internal Medicine

## 2017-09-15 ENCOUNTER — Telehealth: Payer: Self-pay | Admitting: *Deleted

## 2017-09-15 ENCOUNTER — Other Ambulatory Visit: Payer: Self-pay | Admitting: Neurology

## 2017-09-15 DIAGNOSIS — W19XXXD Unspecified fall, subsequent encounter: Secondary | ICD-10-CM | POA: Diagnosis not present

## 2017-09-15 DIAGNOSIS — D510 Vitamin B12 deficiency anemia due to intrinsic factor deficiency: Secondary | ICD-10-CM | POA: Insufficient documentation

## 2017-09-15 DIAGNOSIS — G9608 Other cranial cerebrospinal fluid leak: Secondary | ICD-10-CM

## 2017-09-15 DIAGNOSIS — B9689 Other specified bacterial agents as the cause of diseases classified elsewhere: Secondary | ICD-10-CM

## 2017-09-15 DIAGNOSIS — G20A1 Parkinson's disease without dyskinesia, without mention of fluctuations: Secondary | ICD-10-CM

## 2017-09-15 DIAGNOSIS — G960 Cerebrospinal fluid leak: Secondary | ICD-10-CM | POA: Diagnosis not present

## 2017-09-15 DIAGNOSIS — N3281 Overactive bladder: Secondary | ICD-10-CM

## 2017-09-15 DIAGNOSIS — D518 Other vitamin B12 deficiency anemias: Secondary | ICD-10-CM | POA: Diagnosis not present

## 2017-09-15 DIAGNOSIS — E538 Deficiency of other specified B group vitamins: Secondary | ICD-10-CM | POA: Diagnosis not present

## 2017-09-15 DIAGNOSIS — E039 Hypothyroidism, unspecified: Secondary | ICD-10-CM | POA: Diagnosis not present

## 2017-09-15 DIAGNOSIS — G14 Postpolio syndrome: Secondary | ICD-10-CM | POA: Insufficient documentation

## 2017-09-15 DIAGNOSIS — N3090 Cystitis, unspecified without hematuria: Secondary | ICD-10-CM | POA: Diagnosis not present

## 2017-09-15 DIAGNOSIS — B961 Klebsiella pneumoniae [K. pneumoniae] as the cause of diseases classified elsewhere: Secondary | ICD-10-CM

## 2017-09-15 DIAGNOSIS — G20 Parkinson's disease: Secondary | ICD-10-CM

## 2017-09-15 NOTE — Progress Notes (Signed)
: Provider:  Noah Delaine. Sheppard Coil, MD Location:  [place] Room Number: 206D Place of Service:  SNF ([phone])  PCP: Binnie Rail, MD Patient Care Team: Binnie Rail, MD as PCP - General (Internal Medicine) Tat, Eustace Quail, DO as Consulting Physician (Neurology) Rutherford Guys, MD as Consulting Physician (Ophthalmology)  Extended Emergency Contact Information Primary Emergency Contact: Kerney,Joey & Jesusita Oka [place] Phone: [phone] Relation: Nephew Secondary Emergency Contact: Grafton Folk [place] Phone: [phone] Relation: Friend     Allergies: Atorvastatin; Crestor [rosuvastatin calcium]; and Vesicare [solifenacin]  Chief Complaint  Patient presents with  . New Admit To SNF    following hospitalization 09/11/17 to 09/14/17 fall, 6 falls in past 2 weeks, skin laceration forehead.    HPI: Patient is 83 y.o. female with history of stroke, hypothyroidism, hyperlipidemia, Parkinson's disease, pernicious anemia, RLS, post polio syndrome, who presented to [facility] emergency department with a fall and increased urinary frequency. Patient had at least 6 falls in last 2 weeks, she did not lose, consciousness. On his CT she was noted to have a small left-sided subdural hygroma which was reviewed by neurosurgery and noted to be stable. Patient was admitted to [facility] from 8/19-22 where she was found to have a Klebsiella UTI treated with IV Rocephin with the plan to discharge on oral antibiotics. Hospital course was, K by finding of an elevated TSH and patient's dose of Synthroid was increased. Patient is to be admitted to skilled nursing facility with generalized weakness for OT/PT. While at skilled nursing facility patient will be followed for Parkinson's disease treated with Mirapex, overactive bladder treated with Detrol LA, and vitamin B12 deficiency treated with replacement.  Past Medical History:    Diagnosis Date  . Abnormal Pap smear of vagina 09/2006   Dr Nori Riis  . Abnormality of gait 05/24/2015   PMH R occipital infarct B12 deficiency H/o polio affected left leg  In Physical Therapy to improve balance & strength through [facility]  . Arthritis 12/25/2015  . Cervicalgia 01/17/2009   MRI 03/27/16:  IMPRESSION: 1. At C3-4 there is a mild broad-based disc bulge. Moderate bilateral facet arthropathy and uncovertebral degenerative changes resulting in moderate bilateral foraminal stenosis. 2. At C5-6 there is a broad-based disc osteophyte complex. Bilateral uncovertebral degenerative changes and facet arthropathy resulting in moderate bilateral foraminal stenosis.  . Fall 09/11/2017  . History of CVA (cerebrovascular accident) 07/20/2012   Chronic R occipital infarct ; stable on CT 05/21/2012 .Dr Jannifer Franklin , Neurology   . History of TIA (transient ischemic attack) 01/26/2008  . HYPERLIPIDEMIA 01/26/2008   NMR Lipoprofile 2009: LDL 135 (1704/911), HDL 62, TG 131. LDL goal = < 100; ideally < 70. MGF MI in 35s No FH CVA  . Hypothyroidism   . Intention tremor 12/08/2013   Onset after fall 04/2012   . Irritable bowel syndrome (IBS) 01/17/2009   Dr. Unice Cobble  . Osteopenia    PMH fracture heel, wrist  . Parkinson disease ([place]) 06/25/2016   Dr Tat  . Pernicious anemia   . Post-polio syndrome    minor problems L leg  . Restless leg syndrome 12/25/2015   Dr Tat  . Subdural hygroma 09/11/2017  . Swallowing disorder 03/04/2017  . Urinary incontinence 09/07/2008   Dr McDiarmid, Urology   . Vitamin B12 deficiency   . Weakness 06/24/2017    Past Surgical History:  Procedure Laterality Date  . BLADDER  SUSPENSION  2000  . CATARACT EXTRACTION, BILATERAL     Dr. Gershon Crane  . no colonoscopy     "I never felt I needed one "  . YAG LASER APPLICATION Left    Dr. Gershon Crane    Allergies as of 09/15/2017      Reactions   Atorvastatin    REACTION: Felt funny (only way patient could describe)   Crestor  [rosuvastatin Calcium]    Leg cramps, muscle aches   Vesicare [solifenacin]    constipation      Medication List       Accurate as of 09/15/17  9:44 AM. Always use your most recent med list.          acetaminophen 325 MG tablet Commonly known as:  TYLENOL Take 650 mg by mouth. Take 2 tablets every 6 hours as needed for pain   aspirin 81 MG tablet Take 81 mg by mouth daily.   CALCIUM + D PO Take 1 tablet by mouth daily. Reported on 12/25/2015   cephALEXin 500 MG capsule Commonly known as:  KEFLEX Take 1 capsule (500 mg total) by mouth every 12 (twelve) hours.   cholecalciferol 1000 units tablet Commonly known as:  VITAMIN D Take 1,000 Units by mouth daily.   GAS-X EXTRA STRENGTH 125 MG Caps Generic drug:  Simethicone Take 125 mg by mouth daily as needed (gas).   levothyroxine 75 MCG tablet Commonly known as:  SYNTHROID, LEVOTHROID Take 0.5 tablets (37.5 mcg total) by mouth daily before breakfast.   polyethylene glycol packet Commonly known as:  MIRALAX / GLYCOLAX Take 17 g by mouth daily as needed.   pramipexole 0.125 MG tablet Commonly known as:  MIRAPEX Take 1 tablet (0.125 mg total) by mouth 2 (two) times daily.   tolterodine 4 MG 24 hr capsule Commonly known as:  DETROL LA Take 4 mg by mouth daily.   vitamin B-12 1000 MCG tablet Commonly known as:  CYANOCOBALAMIN Take 1 tablet (1,000 mcg total) by mouth daily.       Meds ordered this encounter  Medications  . acetaminophen (TYLENOL) 325 MG tablet    Sig: Take 650 mg by mouth. Take 2 tablets every 6 hours as needed for pain    Immunization History  Administered Date(s) Administered  . Tdap 03/04/2017, 09/11/2017  . Zoster 01/14/2008    Social History  Substance Use Topics  . Smoking status: Never Smoker  . Smokeless tobacco: Never Used  . Alcohol use 0.6 oz/week    1 Glasses of wine per week     Comment: 0.6 oz per week    Family history is   Family History  Problem Relation Age of  Onset  . Heart attack Maternal Grandfather        70s  . Emphysema Mother   . Hypertension Mother   . Alzheimer's disease Father   . Breast cancer Maternal Grandmother   . Cancer Maternal Grandmother        breast  . Diabetes Neg Hx   . Stroke Neg Hx   . Parkinson's disease Neg Hx       Review of Systems  DATA OBTAINED: from patient, nurse  GENERAL:  no fevers, fatigue, appetite changes SKIN: No itching, or rash EYES: No eye pain, redness, discharge EARS: No earache, tinnitus, change in hearing NOSE: No congestion, drainage or bleeding  MOUTH/THROAT: No mouth or tooth pain, No sore throat RESPIRATORY: No cough, wheezing, SOB CARDIAC: No chest pain, palpitations, lower extremity edema  GI: No abdominal pain, No N/V/D or constipation, No heartburn or reflux  GU: No dysuria, frequency or urgency, or incontinence  MUSCULOSKELETAL: No unrelieved bone/joint pain NEUROLOGIC: No headache, dizziness or focal weakness PSYCHIATRIC: No c/o anxiety or sadness   Vitals:   09/15/17 0934  BP: (!) 114/54  Pulse: 77  Resp: 16  Temp: (!) 97.2 F (36.2 C)    SpO2 Readings from Last 1 Encounters:  09/14/17 100%   Body mass index is 18.18 kg/m.     Physical Exam  GENERAL APPEARANCE: Alert, conversant,  No acute distress.  SKIN: No diaphoresis rash HEAD: Normocephalic, atraumatic  EYES: Conjunctiva/lids clear. Pupils round, reactive. EOMs intact.  EARS: External exam WNL, canals clear. Hearing grossly normal.  NOSE: No deformity or discharge.  MOUTH/THROAT: Lips w/o lesions  RESPIRATORY: Breathing is even, unlabored. Lung sounds are clear   CARDIOVASCULAR: Heart RRR no murmurs, rubs or gallops. No peripheral edema.   GASTROINTESTINAL: Abdomen is soft, non-tender, not distended w/ normal bowel sounds. GENITOURINARY: Bladder non tender, not distended  MUSCULOSKELETAL: No abnormal joints or musculature NEUROLOGIC:  Cranial nerves 2-12 grossly intact. Moves all extremities    PSYCHIATRIC: Mood and affect appropriate to situation, no behavioral issues  Patient Active Problem List   Diagnosis Date Noted  . Pernicious anemia   . Post-polio syndrome   . Vitamin B12 deficiency   . Fall 09/11/2017  . Subdural hygroma 09/11/2017  . Weakness 06/24/2017  . UTI (urinary tract infection) 06/24/2017  . Swallowing disorder 03/04/2017  . Parkinson disease ([place]) 06/25/2016  . Restless leg syndrome 12/25/2015  . Arthritis 12/25/2015  . Abnormality of gait 05/24/2015  . Intention tremor 12/08/2013  . History of CVA (cerebrovascular accident) 07/20/2012  . IRRITABLE BOWEL SYNDROME 01/17/2009  . Cervicalgia 01/17/2009  . Irritable bowel syndrome (IBS) 01/17/2009  . Urinary incontinence 09/07/2008  . ANEMIA, B12 DEFICIENCY 04/26/2008  . HYPERLIPIDEMIA 01/26/2008  . History of TIA (transient ischemic attack) 01/26/2008  . Hypothyroidism 01/04/2008  . Osteopenia 01/04/2008  . Abnormal Pap smear of vagina 09/24/2006      Labs reviewed: Basic Metabolic Panel:    Component Value Date/Time   NA 136 09/14/2017 0454   K 4.2 09/14/2017 0454   CL 106 09/14/2017 0454   CO2 25 09/14/2017 0454   GLUCOSE 103 (H) 09/14/2017 0454   BUN 6 09/14/2017 0454   CREATININE 0.65 09/14/2017 0454   CALCIUM 7.9 (L) 09/14/2017 0454   PROT 5.6 (L) 09/11/2017 1738   ALBUMIN 2.8 (L) 09/11/2017 1738   AST 53 (H) 09/11/2017 1738   ALT 54 09/11/2017 1738   ALKPHOS 111 09/11/2017 1738   BILITOT 1.4 (H) 09/11/2017 1738   GFRNONAA >60 09/14/2017 0454   GFRAA >60 09/14/2017 0454     Recent Labs  06/13/17 0827  09/11/17 2244 09/12/17 0502 09/13/17 0540 09/14/17 0454  NA  --   < >  --  138 136 136  K  --   < >  --  3.3* 3.3* 4.2  CL  --   < >  --  106 103 106  CO2  --   < >  --  24 26 25   GLUCOSE  --   < >  --  105* 104* 103*  BUN  --   < >  --  10 6 6   CREATININE  --   < >  --  0.62 0.65 0.65  CALCIUM  --   < >  --  7.7* 7.9* 7.9*  MG 2.1  --  1.5*  --  1.7  --   < > =  values in this interval not displayed. Liver Function Tests:  Recent Labs  03/04/17 1508 09/11/17 1738  AST 25 53*  ALT 5 54  ALKPHOS 80 111  BILITOT 0.9 1.4*  PROT 7.0 5.6*  ALBUMIN 4.2 2.8*   No results for input(s): LIPASE, AMYLASE in the last 8760 hours. No results for input(s): AMMONIA in the last 8760 hours. CBC:  Recent Labs  03/04/17 1508 06/13/17 0647  09/11/17 1738 09/12/17 0502 09/13/17 0540 09/14/17 0454  WBC 4.4 7.8  --  5.7 7.5 5.0 4.8  NEUTROABS 2.1 5.6  --  3.8  --   --   --   HGB 12.1 11.4*  < > 11.6* 11.3* 11.0* 10.0*  HCT 36.2 34.2*  < > 34.6* 33.7* 32.0* 30.0*  MCV 85.4 85.3  --  83.8 83.4 82.3 82.6  PLT 204.0 163  --  229 251 203 208  < > = values in this interval not displayed. Lipid No results for input(s): CHOL, HDL, LDLCALC, TRIG in the last 8760 hours.  Cardiac Enzymes: No results for input(s): CKTOTAL, CKMB, CKMBINDEX, TROPONINI in the last 8760 hours. BNP: No results for input(s): BNP in the last 8760 hours. No results found for: [facility] Lab Results  Component Value Date   HGBA1C 5.8 12/14/2009   Lab Results  Component Value Date   TSH 7.416 (H) 09/11/2017   Lab Results  Component Value Date   VITAMINB12 866 03/04/2017   Lab Results  Component Value Date   FOLATE 7.1 06/15/2008   Lab Results  Component Value Date   IRON 61 09/18/2016   TIBC 281 09/18/2016   FERRITIN 98 09/18/2016    Imaging and Procedures obtained prior to SNF admission: Ct Head Wo Contrast  Result Date: 09/11/2017 CLINICAL DATA:  Headache and neck pain EXAM: CT HEAD WITHOUT CONTRAST CT CERVICAL SPINE WITHOUT CONTRAST TECHNIQUE: Multidetector CT imaging of the head and cervical spine was performed following the standard protocol without intravenous contrast. Multiplanar CT image reconstructions of the cervical spine were also generated. COMPARISON:  Head CT November 24, 2014; cervical spine CT May 21, 2012; cervical MRI Mar 27, 2016 FINDINGS: CT HEAD  FINDINGS Brain: In comparison with the prior study from 2016, there is a small subdural hygroma on the left which involves frontal, superior temporal, and left parietal lobes. The maximum thickness of this hygroma is noted in the left parietal region measuring 4 mm. There is modest mass effect on the left supratentorial structures without midline shift. No acute appearing hemorrhage is seen intraaxially or extraaxially on this study. There is mild underlying atrophy. There is no intracranial mass. There is patchy small vessel disease throughout the centra semiovale bilaterally. There are lacunar type infarcts in the head of the caudate nucleus as well as in the head of the caudate nucleus on the left. There is also prior infarct in the anterior, medial globus pallidum on the left with involvement of portions of the anterior lentiform nucleus as well as anterior limbs of the internal and external capsules on the left. Basal ganglia calcification is felt to be physiologic in this age group. There is evidence of a prior infarct in the inferior right occipital lobe, stable. No acute infarct is evident on this study. Vascular: There is no appreciable hyperdense vessel. There is calcification in each carotid siphon region. There is also calcification in each distal vertebral artery.  Skull: The bony calvarium appears intact. Sinuses/Orbits: There is mild mucosal thickening in several ethmoid air cells bilaterally. There is mild mucosal thickening in the posteromedial left maxillary antrum. Other visualized paranasal sinuses are clear. Orbits appear symmetric bilaterally. Other: Mastoid air cells are clear. CT CERVICAL SPINE FINDINGS Alignment: There is 3 mm of anterolisthesis of C7 on T1, stable. No new spondylolisthesis. Skull base and vertebrae: Skull base and craniocervical junction regions appear normal. There is no evident fracture. No blastic or lytic bone lesions. Bones appear somewhat osteoporotic. Soft tissues and  spinal canal: Prevertebral soft tissues and predental space regions are normal. No paraspinous lesions. No cord or canal hematoma appreciable. Disc levels: There is moderately severe disc space narrowing at C5-6 and C6-7. There is milder narrowing at C3-4, C4-5, and C7-T1. There is multilevel facet osteoarthritic change. Exit foraminal narrowing is most notable on the left at C3-4 due to bony hypertrophy. There is no frank disc extrusion or stenosis. Upper chest: There is scarring in the lung apices, more on the right than on the left, stable. Other: There is atherosclerotic calcification in the carotid arteries. IMPRESSION: CT head: 1. Small left sided subdural hygroma with maximum thickness of the hygroma measured at 4 mm. There is modest mass effect on the left supratentorial structures, but there is no midline shift. No acute hemorrhage evident. 2. There is atrophy with prior right occipital infarct. There are prior infarcts in the left basal ganglia anteriorly as well as in the head of the caudate nucleus on the right. No acute infarct evident. 3.  Foci of arterial vascular calcification noted. 4.  Mucosal thickening in ethmoid air cells. CT cervical spine: No fracture. Stable mild spondylolisthesis at C7 on T1, felt to be due to underlying spondylosis. There is multilevel osteoarthritic change. There are foci of calcification in each carotid artery. Critical Value/emergent results were called by telephone at the time of interpretation on 09/11/2017 at 1:50 pm to Dr. Milton Ferguson , who verbally acknowledged these results. Electronically Signed   By: Lowella Grip III M.D.   On: 09/11/2017 13:50   Ct Cervical Spine Wo Contrast  Result Date: 09/11/2017 CLINICAL DATA:  Headache and neck pain EXAM: CT HEAD WITHOUT CONTRAST CT CERVICAL SPINE WITHOUT CONTRAST TECHNIQUE: Multidetector CT imaging of the head and cervical spine was performed following the standard protocol without intravenous contrast.  Multiplanar CT image reconstructions of the cervical spine were also generated. COMPARISON:  Head CT November 24, 2014; cervical spine CT May 21, 2012; cervical MRI Mar 27, 2016 FINDINGS: CT HEAD FINDINGS Brain: In comparison with the prior study from 2016, there is a small subdural hygroma on the left which involves frontal, superior temporal, and left parietal lobes. The maximum thickness of this hygroma is noted in the left parietal region measuring 4 mm. There is modest mass effect on the left supratentorial structures without midline shift. No acute appearing hemorrhage is seen intraaxially or extraaxially on this study. There is mild underlying atrophy. There is no intracranial mass. There is patchy small vessel disease throughout the centra semiovale bilaterally. There are lacunar type infarcts in the head of the caudate nucleus as well as in the head of the caudate nucleus on the left. There is also prior infarct in the anterior, medial globus pallidum on the left with involvement of portions of the anterior lentiform nucleus as well as anterior limbs of the internal and external capsules on the left. Basal ganglia calcification is felt to be physiologic in  this age group. There is evidence of a prior infarct in the inferior right occipital lobe, stable. No acute infarct is evident on this study. Vascular: There is no appreciable hyperdense vessel. There is calcification in each carotid siphon region. There is also calcification in each distal vertebral artery. Skull: The bony calvarium appears intact. Sinuses/Orbits: There is mild mucosal thickening in several ethmoid air cells bilaterally. There is mild mucosal thickening in the posteromedial left maxillary antrum. Other visualized paranasal sinuses are clear. Orbits appear symmetric bilaterally. Other: Mastoid air cells are clear. CT CERVICAL SPINE FINDINGS Alignment: There is 3 mm of anterolisthesis of C7 on T1, stable. No new spondylolisthesis. Skull base  and vertebrae: Skull base and craniocervical junction regions appear normal. There is no evident fracture. No blastic or lytic bone lesions. Bones appear somewhat osteoporotic. Soft tissues and spinal canal: Prevertebral soft tissues and predental space regions are normal. No paraspinous lesions. No cord or canal hematoma appreciable. Disc levels: There is moderately severe disc space narrowing at C5-6 and C6-7. There is milder narrowing at C3-4, C4-5, and C7-T1. There is multilevel facet osteoarthritic change. Exit foraminal narrowing is most notable on the left at C3-4 due to bony hypertrophy. There is no frank disc extrusion or stenosis. Upper chest: There is scarring in the lung apices, more on the right than on the left, stable. Other: There is atherosclerotic calcification in the carotid arteries. IMPRESSION: CT head: 1. Small left sided subdural hygroma with maximum thickness of the hygroma measured at 4 mm. There is modest mass effect on the left supratentorial structures, but there is no midline shift. No acute hemorrhage evident. 2. There is atrophy with prior right occipital infarct. There are prior infarcts in the left basal ganglia anteriorly as well as in the head of the caudate nucleus on the right. No acute infarct evident. 3.  Foci of arterial vascular calcification noted. 4.  Mucosal thickening in ethmoid air cells. CT cervical spine: No fracture. Stable mild spondylolisthesis at C7 on T1, felt to be due to underlying spondylosis. There is multilevel osteoarthritic change. There are foci of calcification in each carotid artery. Critical Value/emergent results were called by telephone at the time of interpretation on 09/11/2017 at 1:50 pm to Dr. Milton Ferguson , who verbally acknowledged these results. Electronically Signed   By: Lowella Grip III M.D.   On: 09/11/2017 13:50     Not all labs, radiology exams or other studies done during hospitalization come through on my EPIC note; however  they are reviewed by me.    Assessment and plan  FREQUENT FALLS/SUBDURAL HYGROMA-felt to be multifactorial including old age, Parkinson's disease, history of stroke, poor balance and UTI. CT head showed a small subdural hygroma with modest mass effect, neurosurgery-no need for intervention SNF - patient admitted to skilled nursing facility for OT/PT  KLEBSIELLA UTI-treated with IV antibiotics in hospital and transitioned over to Keflex SNF - continue Keflex 500 mg every 12 hours 7 days  HYPOTHYROIDISM-TSH was elevated at 7; patient's Synthroid was increased from 50 g daily to 75 g daily SNF - plan to continue Synthroid 75 g by mouth daily  PARKINSON'S DISEASE SNF - not stated as stable; continue Mirapex 0.125 mg by mouth twice a day   OVERACTIVE bladder SNF - not stated as uncontrolled; plan to continue Detrol LA 4 mg by mouth daily  VITAMIN B 12 DEFICIENCY SNF - stable; plan to continue replacement at 1000 g by mouth daily ; DC hemoglobin 10; will  follow-up CBC   Time spent greater than 45 minutes;> 50% of time with patient was spent reviewing records, labs, tests and studies, counseling and developing plan of care  Noah Delaine. Sheppard Coil, MD

## 2017-09-15 NOTE — Progress Notes (Signed)
   09/12/17 1337  PT Time Calculation  PT Start Time (ACUTE ONLY) 1202  PT Stop Time (ACUTE ONLY) 1240  PT Time Calculation (min) (ACUTE ONLY) 38 min  PT G-Codes **NOT FOR INPATIENT CLASS**  Functional Assessment Tool Used AM-PAC 6 Clicks Basic Mobility;Clinical judgement  Functional Limitation Mobility: Walking and moving around  Mobility: Walking and Moving Around Current Status (K9179) CM  Mobility: Walking and Moving Around Goal Status (X5056) CJ  PT General Charges  $$ ACUTE PT VISIT 1 Visit  PT Evaluation  $PT Eval Moderate Complexity 1 Mod  PT Treatments  $Gait Training 8-22 mins  $Therapeutic Activity 8-22 mins

## 2017-09-15 NOTE — Telephone Encounter (Signed)
Pt was on TCM list admitted 09/11/17 for Falls. Patient had at least 6 falls in the past 2 weeks prior to admission. She did not lose consciousness. She was noted to have a small left sided subdural hygroma on head CT which reviewed by Neurosurgery after discussion with ED physician and noted to be stable. Patient was found to have Klebsiella UTI. Treated with IV ceftriaxone and was discharge 09/14/17 to SNF with oral antibiotics....Johny Chess

## 2017-09-16 ENCOUNTER — Telehealth: Payer: Self-pay

## 2017-09-16 NOTE — Telephone Encounter (Signed)
This is a patient of [place], who was admitted to [facility] after hospitalization. [facility] F/U is needed. Hospital discharge from [facility] on 09/14/2017

## 2017-09-17 LAB — CULTURE, BLOOD (ROUTINE X 2)
CULTURE: NO GROWTH
Culture: NO GROWTH
SPECIAL REQUESTS: ADEQUATE
Special Requests: ADEQUATE

## 2017-09-19 ENCOUNTER — Encounter: Payer: Self-pay | Admitting: Internal Medicine

## 2017-09-19 DIAGNOSIS — B9689 Other specified bacterial agents as the cause of diseases classified elsewhere: Secondary | ICD-10-CM | POA: Insufficient documentation

## 2017-09-19 DIAGNOSIS — N3281 Overactive bladder: Secondary | ICD-10-CM | POA: Insufficient documentation

## 2017-09-19 DIAGNOSIS — N3090 Cystitis, unspecified without hematuria: Secondary | ICD-10-CM

## 2017-09-23 ENCOUNTER — Ambulatory Visit: Payer: Medicare Other | Admitting: Internal Medicine

## 2017-09-29 ENCOUNTER — Other Ambulatory Visit: Payer: Self-pay | Admitting: *Deleted

## 2017-09-29 NOTE — Patient Outreach (Signed)
[facility]) Care Management  09/29/2017  Sultana Tierney Fleagle [birth year] [id number]   Met with patient at bedside of facility.  Patient reports she is doing well, she is waiting on lunch.  RNCM reviewed [facility] program. Patient states she will review. Will follow up as indicated with SW for patient needs Royetta Crochet. Laymond Purser, RN, BSN, [place] [phone]) Business Cell  [phone]) Toll Free Office

## 2017-09-30 ENCOUNTER — Encounter: Payer: Self-pay | Admitting: Internal Medicine

## 2017-09-30 ENCOUNTER — Non-Acute Institutional Stay (SKILLED_NURSING_FACILITY): Payer: Medicare Other | Admitting: Internal Medicine

## 2017-09-30 DIAGNOSIS — G2581 Restless legs syndrome: Secondary | ICD-10-CM

## 2017-09-30 DIAGNOSIS — B9689 Other specified bacterial agents as the cause of diseases classified elsewhere: Secondary | ICD-10-CM | POA: Diagnosis not present

## 2017-09-30 DIAGNOSIS — N3281 Overactive bladder: Secondary | ICD-10-CM | POA: Diagnosis not present

## 2017-09-30 DIAGNOSIS — N3090 Cystitis, unspecified without hematuria: Secondary | ICD-10-CM

## 2017-09-30 DIAGNOSIS — E039 Hypothyroidism, unspecified: Secondary | ICD-10-CM

## 2017-09-30 DIAGNOSIS — G960 Cerebrospinal fluid leak: Secondary | ICD-10-CM

## 2017-09-30 DIAGNOSIS — E538 Deficiency of other specified B group vitamins: Secondary | ICD-10-CM | POA: Diagnosis not present

## 2017-09-30 DIAGNOSIS — W19XXXD Unspecified fall, subsequent encounter: Secondary | ICD-10-CM

## 2017-09-30 DIAGNOSIS — D510 Vitamin B12 deficiency anemia due to intrinsic factor deficiency: Secondary | ICD-10-CM | POA: Diagnosis not present

## 2017-09-30 DIAGNOSIS — G20 Parkinson's disease: Secondary | ICD-10-CM

## 2017-09-30 DIAGNOSIS — G9608 Other cranial cerebrospinal fluid leak: Secondary | ICD-10-CM

## 2017-09-30 NOTE — Progress Notes (Signed)
Location:  [place] Room Number: 109 Place of Service:  SNF [phone])  Provider: Noah Delaine. Sheppard Coil, MD  PCP: Binnie Rail, MD Patient Care Team: Binnie Rail, MD as PCP - General (Internal Medicine) Tat, Eustace Quail, DO as Consulting Physician (Neurology) Rutherford Guys, MD as Consulting Physician (Ophthalmology)  Extended Emergency Contact Information Primary Emergency Contact: Novakovich,Joey & Jesusita Oka [place] of [place] Phone: [phone] Relation: Nephew Secondary Emergency Contact: Grafton Folk [place] Phone: [phone] Relation: Friend  Allergies  Allergen Reactions  . Atorvastatin     REACTION: Felt funny (only way patient could describe)  . Crestor [Rosuvastatin Calcium]     Leg cramps, muscle aches  . Vesicare [Solifenacin]     constipation    Chief Complaint  Patient presents with  . Discharge Note    discharge from SNF to home    HPI:  83 y.o. female  Brief history of stroke, hypothyroidism, hyperlipidemia, Parkinson's disease, pernicious anemia, last, post polio syndrome, who was admitted to [facility] from 10/19-22 where she was found to have a Klebsiella UTI treated with IV Rocephin. Hospital course was ding of elevated TSH and patient's dose of Synthroid was increased. Patient was admitted to skilled nursing facility with generalized weakness and is now ready to be DC'd to home.    Past Medical History:  Diagnosis Date  . Abnormal Pap smear of vagina 09/2006   Dr Nori Riis  . Abnormality of gait 05/24/2015   PMH R occipital infarct B12 deficiency H/o polio affected left leg  In Physical Therapy to improve balance & strength through [facility]  . Arthritis 12/25/2015  . Cervicalgia 01/17/2009   MRI 03/27/16:  IMPRESSION: 1. At C3-4 there is a mild broad-based disc bulge. Moderate bilateral facet arthropathy and uncovertebral degenerative changes resulting in moderate bilateral foraminal  stenosis. 2. At C5-6 there is a broad-based disc osteophyte complex. Bilateral uncovertebral degenerative changes and facet arthropathy resulting in moderate bilateral foraminal stenosis.  . Fall 09/11/2017  . History of CVA (cerebrovascular accident) 07/20/2012   Chronic R occipital infarct ; stable on CT 05/21/2012 .Dr Jannifer Franklin , Neurology   . History of TIA (transient ischemic attack) 01/26/2008  . HYPERLIPIDEMIA 01/26/2008   NMR Lipoprofile 2009: LDL 135 (1704/911), HDL 62, TG 131. LDL goal = < 100; ideally < 70. MGF MI in 56s No FH CVA  . Hypothyroidism   . Intention tremor 12/08/2013   Onset after fall 04/2012   . Irritable bowel syndrome (IBS) 01/17/2009   Dr. Unice Cobble  . Osteopenia    PMH fracture heel, wrist  . Parkinson disease ([place]) 06/25/2016   Dr Tat  . Pernicious anemia   . Post-polio syndrome    minor problems L leg  . Restless leg syndrome 12/25/2015   Dr Tat  . Subdural hygroma 09/11/2017  . Swallowing disorder 03/04/2017  . Urinary incontinence 09/07/2008   Dr McDiarmid, Urology   . Vitamin B12 deficiency   . Weakness 06/24/2017    Past Surgical History:  Procedure Laterality Date  . BLADDER SUSPENSION  2000  . CATARACT EXTRACTION, BILATERAL     Dr. Gershon Crane  . no colonoscopy     "I never felt I needed one "  . YAG LASER APPLICATION Left    Dr. Gershon Crane     reports that  has never smoked. she has never used smokeless tobacco. She reports that she drinks about 0.6 oz of alcohol  per week. She reports that she does not use drugs. Social History   Socioeconomic History  . Marital status: Single    Spouse name: Not on file  . Number of children: Not on file  . Years of education: Not on file  . Highest education level: Not on file  Social Needs  . Financial resource strain: Not on file  . Food insecurity - worry: Not on file  . Food insecurity - inability: Not on file  . Transportation needs - medical: Not on file  . Transportation needs - non-medical: Not on  file  Occupational History  . Not on file  Tobacco Use  . Smoking status: Never Smoker  . Smokeless tobacco: Never Used  Substance and Sexual Activity  . Alcohol use: Yes    Alcohol/week: 0.6 oz    Types: 1 Glasses of wine per week    Comment: 0.6 oz per week  . Drug use: No  . Sexual activity: Not on file  Other Topics Concern  . Not on file  Social History Narrative  . Not on file    Pertinent  Health Maintenance Due  Topic Date Due  . DEXA SCAN  05/03/2016  . INFLUENZA VACCINE  02/22/2018 (Originally 06/24/2017)  . PNA vac Low Risk Adult (1 of 2 - PCV13) 01/11/2019 (Originally 02/02/1998)    Medications: Allergies as of 09/30/2017      Reactions   Atorvastatin    REACTION: Felt funny (only way patient could describe)   Crestor [rosuvastatin Calcium]    Leg cramps, muscle aches   Vesicare [solifenacin]    constipation      Medication List        Accurate as of 09/30/17  3:00 PM. Always use your most recent med list.          acetaminophen 325 MG tablet Commonly known as:  TYLENOL Take 650 mg by mouth. Take 2 tablets every 6 hours as needed for pain   aspirin 81 MG tablet Take 81 mg by mouth daily.   CALCIUM + D PO Take 1 tablet by mouth daily. Reported on 12/25/2015   cholecalciferol 1000 units tablet Commonly known as:  VITAMIN D Take 1,000 Units by mouth daily.   GAS-X EXTRA STRENGTH 125 MG Caps Generic drug:  Simethicone Take 125 mg by mouth daily as needed (gas).   levothyroxine 75 MCG tablet Commonly known as:  SYNTHROID, LEVOTHROID Take 0.5 tablets (37.5 mcg total) by mouth daily before breakfast.   NUTRITIONAL DRINK PO Take by mouth. Medpass, 4 oz daily to support weight status   pramipexole 0.125 MG tablet Commonly known as:  MIRAPEX TAKE 1 TABLET TWICE DAILY.   tolterodine 4 MG 24 hr capsule Commonly known as:  DETROL LA Take 4 mg by mouth daily.   vitamin B-12 1000 MCG tablet Commonly known as:  CYANOCOBALAMIN Take 1 tablet (1,000  mcg total) by mouth daily.        Vitals:   09/30/17 0937  BP: (!) 110/56  Pulse: 82  Resp: 18  Temp: 98.1 F (36.7 C)  Weight: 88 lb (39.9 kg)  Height: 5' (1.524 m)   Body mass index is 17.19 kg/m.  Physical Exam  GENERAL APPEARANCE: Alert, conversant. No acute distress.  HEENT: Unremarkable. RESPIRATORY: Breathing is even, unlabored. Lung sounds are clear   CARDIOVASCULAR: Heart RRR no murmurs, rubs or gallops. No peripheral edema.  GASTROINTESTINAL: Abdomen is soft, non-tender, not distended w/ normal bowel sounds.  NEUROLOGIC: Cranial nerves  2-12 grossly intact. Moves all extremities   Labs reviewed: Basic Metabolic Panel: Recent Labs    06/13/17 0827  09/11/17 2244 09/12/17 0502 09/13/17 0540 09/14/17 0454  NA  --    < >  --  138 136 136  K  --    < >  --  3.3* 3.3* 4.2  CL  --    < >  --  106 103 106  CO2  --    < >  --  24 26 25   GLUCOSE  --    < >  --  105* 104* 103*  BUN  --    < >  --  10 6 6   CREATININE  --    < >  --  0.62 0.65 0.65  CALCIUM  --    < >  --  7.7* 7.9* 7.9*  MG 2.1  --  1.5*  --  1.7  --    < > = values in this interval not displayed.   No results found for: [facility] Liver Function Tests: Recent Labs    03/04/17 1508 09/11/17 1738  AST 25 53*  ALT 5 54  ALKPHOS 80 111  BILITOT 0.9 1.4*  PROT 7.0 5.6*  ALBUMIN 4.2 2.8*   No results for input(s): LIPASE, AMYLASE in the last 8760 hours. No results for input(s): AMMONIA in the last 8760 hours. CBC: Recent Labs    03/04/17 1508 06/13/17 0647  09/11/17 1738 09/12/17 0502 09/13/17 0540 09/14/17 0454  WBC 4.4 7.8  --  5.7 7.5 5.0 4.8  NEUTROABS 2.1 5.6  --  3.8  --   --   --   HGB 12.1 11.4*   < > 11.6* 11.3* 11.0* 10.0*  HCT 36.2 34.2*   < > 34.6* 33.7* 32.0* 30.0*  MCV 85.4 85.3  --  83.8 83.4 82.3 82.6  PLT 204.0 163  --  229 251 203 208   < > = values in this interval not displayed.   Lipid No results for input(s): CHOL, HDL, LDLCALC, TRIG in the last 8760  hours. Cardiac Enzymes: No results for input(s): CKTOTAL, CKMB, CKMBINDEX, TROPONINI in the last 8760 hours. BNP: No results for input(s): BNP in the last 8760 hours. CBG: No results for input(s): GLUCAP in the last 8760 hours.  Procedures and Imaging Studies During Stay: Ct Head Wo Contrast  Result Date: 09/11/2017 CLINICAL DATA:  Headache and neck pain EXAM: CT HEAD WITHOUT CONTRAST CT CERVICAL SPINE WITHOUT CONTRAST TECHNIQUE: Multidetector CT imaging of the head and cervical spine was performed following the standard protocol without intravenous contrast. Multiplanar CT image reconstructions of the cervical spine were also generated. COMPARISON:  Head CT November 24, 2014; cervical spine CT May 21, 2012; cervical MRI Mar 27, 2016 FINDINGS: CT HEAD FINDINGS Brain: In comparison with the prior study from 2016, there is a small subdural hygroma on the left which involves frontal, superior temporal, and left parietal lobes. The maximum thickness of this hygroma is noted in the left parietal region measuring 4 mm. There is modest mass effect on the left supratentorial structures without midline shift. No acute appearing hemorrhage is seen intraaxially or extraaxially on this study. There is mild underlying atrophy. There is no intracranial mass. There is patchy small vessel disease throughout the centra semiovale bilaterally. There are lacunar type infarcts in the head of the caudate nucleus as well as in the head of the caudate nucleus on the left. There is also prior  infarct in the anterior, medial globus pallidum on the left with involvement of portions of the anterior lentiform nucleus as well as anterior limbs of the internal and external capsules on the left. Basal ganglia calcification is felt to be physiologic in this age group. There is evidence of a prior infarct in the inferior right occipital lobe, stable. No acute infarct is evident on this study. Vascular: There is no appreciable hyperdense  vessel. There is calcification in each carotid siphon region. There is also calcification in each distal vertebral artery. Skull: The bony calvarium appears intact. Sinuses/Orbits: There is mild mucosal thickening in several ethmoid air cells bilaterally. There is mild mucosal thickening in the posteromedial left maxillary antrum. Other visualized paranasal sinuses are clear. Orbits appear symmetric bilaterally. Other: Mastoid air cells are clear. CT CERVICAL SPINE FINDINGS Alignment: There is 3 mm of anterolisthesis of C7 on T1, stable. No new spondylolisthesis. Skull base and vertebrae: Skull base and craniocervical junction regions appear normal. There is no evident fracture. No blastic or lytic bone lesions. Bones appear somewhat osteoporotic. Soft tissues and spinal canal: Prevertebral soft tissues and predental space regions are normal. No paraspinous lesions. No cord or canal hematoma appreciable. Disc levels: There is moderately severe disc space narrowing at C5-6 and C6-7. There is milder narrowing at C3-4, C4-5, and C7-T1. There is multilevel facet osteoarthritic change. Exit foraminal narrowing is most notable on the left at C3-4 due to bony hypertrophy. There is no frank disc extrusion or stenosis. Upper chest: There is scarring in the lung apices, more on the right than on the left, stable. Other: There is atherosclerotic calcification in the carotid arteries. IMPRESSION: CT head: 1. Small left sided subdural hygroma with maximum thickness of the hygroma measured at 4 mm. There is modest mass effect on the left supratentorial structures, but there is no midline shift. No acute hemorrhage evident. 2. There is atrophy with prior right occipital infarct. There are prior infarcts in the left basal ganglia anteriorly as well as in the head of the caudate nucleus on the right. No acute infarct evident. 3.  Foci of arterial vascular calcification noted. 4.  Mucosal thickening in ethmoid air cells. CT cervical  spine: No fracture. Stable mild spondylolisthesis at C7 on T1, felt to be due to underlying spondylosis. There is multilevel osteoarthritic change. There are foci of calcification in each carotid artery. Critical Value/emergent results were called by telephone at the time of interpretation on 09/11/2017 at 1:50 pm to Dr. Milton Ferguson , who verbally acknowledged these results. Electronically Signed   By: Lowella Grip III M.D.   On: 09/11/2017 13:50   Ct Cervical Spine Wo Contrast  Result Date: 09/11/2017 CLINICAL DATA:  Headache and neck pain EXAM: CT HEAD WITHOUT CONTRAST CT CERVICAL SPINE WITHOUT CONTRAST TECHNIQUE: Multidetector CT imaging of the head and cervical spine was performed following the standard protocol without intravenous contrast. Multiplanar CT image reconstructions of the cervical spine were also generated. COMPARISON:  Head CT November 24, 2014; cervical spine CT May 21, 2012; cervical MRI Mar 27, 2016 FINDINGS: CT HEAD FINDINGS Brain: In comparison with the prior study from 2016, there is a small subdural hygroma on the left which involves frontal, superior temporal, and left parietal lobes. The maximum thickness of this hygroma is noted in the left parietal region measuring 4 mm. There is modest mass effect on the left supratentorial structures without midline shift. No acute appearing hemorrhage is seen intraaxially or extraaxially on this study. There  is mild underlying atrophy. There is no intracranial mass. There is patchy small vessel disease throughout the centra semiovale bilaterally. There are lacunar type infarcts in the head of the caudate nucleus as well as in the head of the caudate nucleus on the left. There is also prior infarct in the anterior, medial globus pallidum on the left with involvement of portions of the anterior lentiform nucleus as well as anterior limbs of the internal and external capsules on the left. Basal ganglia calcification is felt to be physiologic in  this age group. There is evidence of a prior infarct in the inferior right occipital lobe, stable. No acute infarct is evident on this study. Vascular: There is no appreciable hyperdense vessel. There is calcification in each carotid siphon region. There is also calcification in each distal vertebral artery. Skull: The bony calvarium appears intact. Sinuses/Orbits: There is mild mucosal thickening in several ethmoid air cells bilaterally. There is mild mucosal thickening in the posteromedial left maxillary antrum. Other visualized paranasal sinuses are clear. Orbits appear symmetric bilaterally. Other: Mastoid air cells are clear. CT CERVICAL SPINE FINDINGS Alignment: There is 3 mm of anterolisthesis of C7 on T1, stable. No new spondylolisthesis. Skull base and vertebrae: Skull base and craniocervical junction regions appear normal. There is no evident fracture. No blastic or lytic bone lesions. Bones appear somewhat osteoporotic. Soft tissues and spinal canal: Prevertebral soft tissues and predental space regions are normal. No paraspinous lesions. No cord or canal hematoma appreciable. Disc levels: There is moderately severe disc space narrowing at C5-6 and C6-7. There is milder narrowing at C3-4, C4-5, and C7-T1. There is multilevel facet osteoarthritic change. Exit foraminal narrowing is most notable on the left at C3-4 due to bony hypertrophy. There is no frank disc extrusion or stenosis. Upper chest: There is scarring in the lung apices, more on the right than on the left, stable. Other: There is atherosclerotic calcification in the carotid arteries. IMPRESSION: CT head: 1. Small left sided subdural hygroma with maximum thickness of the hygroma measured at 4 mm. There is modest mass effect on the left supratentorial structures, but there is no midline shift. No acute hemorrhage evident. 2. There is atrophy with prior right occipital infarct. There are prior infarcts in the left basal ganglia anteriorly as well  as in the head of the caudate nucleus on the right. No acute infarct evident. 3.  Foci of arterial vascular calcification noted. 4.  Mucosal thickening in ethmoid air cells. CT cervical spine: No fracture. Stable mild spondylolisthesis at C7 on T1, felt to be due to underlying spondylosis. There is multilevel osteoarthritic change. There are foci of calcification in each carotid artery. Critical Value/emergent results were called by telephone at the time of interpretation on 09/11/2017 at 1:50 pm to Dr. Milton Ferguson , who verbally acknowledged these results. Electronically Signed   By: Lowella Grip III M.D.   On: 09/11/2017 13:50    Assessment/Plan:   Klebsiella cystitis  Subdural hygroma  Fall, subsequent encounter  Vitamin B12 deficiency  Pernicious anemia  Restless leg syndrome  Overactive bladder  Parkinson disease ([place])  Hypothyroidism, unspecified type   Patient is being discharged with the following home health services:  OT/PT/nursing  Patient is being discharged with the following durable medical equipment:  Rolling walker  Patient has been advised to f/u with their PCP in 1-2 weeks to bring them up to date on their rehab stay.  Social services at facility was responsible for arranging this appointment.  Pt was provided with a 30 day supply of prescriptions for medications and refills must be obtained from their PCP.  For controlled substances, a more limited supply may be provided adequate until PCP appointment only.  Medications have been reconciled.  Time spent greater than 30 minutes;> 50% of time with patient was spent reviewing records, labs, tests and studies, counseling and developing plan of care  Noah Delaine. Sheppard Coil, MD

## 2017-10-05 ENCOUNTER — Other Ambulatory Visit: Payer: Self-pay | Admitting: Neurology

## 2017-10-05 NOTE — Progress Notes (Deleted)
Subjective:    Patient ID: Elizabeth [name], female    DOB: 01-28-1931, 83 y.o.   MRN: [id number]  HPI The patient is here for follow up from the hospital/rehab.  Admitted to the hospital 09/11/17 - 09/14/17.  She went to the ED after a fall and complained of increased urinary frequency.  She has been having increased weakness and frequent falls.  She had 6 falls in the two weeks prior to admission.  There was no LOC with the last fall.  A ct scan of her head showed a stable subdural hygroma that was reviewed by neurosurgery.  She did have a Klebsiella UTI and received ceftriaxone.  Frequent falls:  Thought to be multifactorial; old age, parkinson's disease, h/o stroke, poor balance and UTI.  Ct of head as above - no acute findings.  She was evaluated by PT and OT and was discharged to SNF.  Subdural hygroma:  Reviewed by neurosurgery.  Stable.  No surgery needd.  Parkinson's: continued on home medications  Hypothyroidism:  TSH elevated.  Synthroid increased.  Recheck tsh today.   UTI, Klebsiella:  She received ceftriaxone which was changed to keflex.     Medications and allergies reviewed with patient and updated if appropriate.  Patient Active Problem List   Diagnosis Date Noted  . Klebsiella cystitis 09/19/2017  . Overactive bladder 09/19/2017  . Pernicious anemia   . Post-polio syndrome   . Vitamin B12 deficiency   . Fall 09/11/2017  . Subdural hygroma 09/11/2017  . Weakness 06/24/2017  . UTI (urinary tract infection) 06/24/2017  . Swallowing disorder 03/04/2017  . Parkinson disease ([place]) 06/25/2016  . Restless leg syndrome 12/25/2015  . Arthritis 12/25/2015  . Abnormality of gait 05/24/2015  . Intention tremor 12/08/2013  . History of CVA (cerebrovascular accident) 07/20/2012  . IRRITABLE BOWEL SYNDROME 01/17/2009  . Cervicalgia 01/17/2009  . Irritable bowel syndrome (IBS) 01/17/2009  . Urinary incontinence 09/07/2008  . ANEMIA, B12 DEFICIENCY 04/26/2008  .  HYPERLIPIDEMIA 01/26/2008  . History of TIA (transient ischemic attack) 01/26/2008  . Hypothyroidism 01/04/2008  . Osteopenia 01/04/2008  . Abnormal Pap smear of vagina 09/24/2006    Current Outpatient Medications on File Prior to Visit  Medication Sig Dispense Refill  . acetaminophen (TYLENOL) 325 MG tablet Take 650 mg by mouth. Take 2 tablets every 6 hours as needed for pain    . aspirin 81 MG tablet Take 81 mg by mouth daily.      . Calcium Carbonate-Vitamin D (CALCIUM + D PO) Take 1 tablet by mouth daily. Reported on 12/25/2015    . cholecalciferol (VITAMIN D) 1000 UNITS tablet Take 1,000 Units by mouth daily.      Marland Kitchen levothyroxine (SYNTHROID, LEVOTHROID) 75 MCG tablet Take 0.5 tablets (37.5 mcg total) by mouth daily before breakfast.    . Nutritional Supplements (NUTRITIONAL DRINK PO) Take by mouth. Medpass, 4 oz daily to support weight status    . pramipexole (MIRAPEX) 0.125 MG tablet TAKE 1 TABLET TWICE DAILY. 60 tablet 0  . Simethicone (GAS-X EXTRA STRENGTH) 125 MG CAPS Take 125 mg by mouth daily as needed (gas).    . tolterodine (DETROL LA) 4 MG 24 hr capsule Take 4 mg by mouth daily.     . vitamin B-12 (CYANOCOBALAMIN) 1000 MCG tablet Take 1 tablet (1,000 mcg total) by mouth daily. 90 tablet 3   No current facility-administered medications on file prior to visit.     Past Medical History:  Diagnosis Date  .  Abnormal Pap smear of vagina 09/2006   Dr Nori Riis  . Abnormality of gait 05/24/2015   PMH R occipital infarct B12 deficiency H/o polio affected left leg  In Physical Therapy to improve balance & strength through Noel Gerold  . Arthritis 12/25/2015  . Cervicalgia 01/17/2009   MRI 03/27/16:  IMPRESSION: 1. At C3-4 there is a mild broad-based disc bulge. Moderate bilateral facet arthropathy and uncovertebral degenerative changes resulting in moderate bilateral foraminal stenosis. 2. At C5-6 there is a broad-based disc osteophyte complex. Bilateral uncovertebral degenerative changes  and facet arthropathy resulting in moderate bilateral foraminal stenosis.  . Fall 09/11/2017  . History of CVA (cerebrovascular accident) 07/20/2012   Chronic R occipital infarct ; stable on CT 05/21/2012 .Dr Jannifer Franklin , Neurology   . History of TIA (transient ischemic attack) 01/26/2008  . HYPERLIPIDEMIA 01/26/2008   NMR Lipoprofile 2009: LDL 135 (1704/911), HDL 62, TG 131. LDL goal = < 100; ideally < 70. MGF MI in 85s No FH CVA  . Hypothyroidism   . Intention tremor 12/08/2013   Onset after fall 04/2012   . Irritable bowel syndrome (IBS) 01/17/2009   Dr. Unice Cobble  . Osteopenia    PMH fracture heel, wrist  . Parkinson disease ([place]) 06/25/2016   Dr Tat  . Pernicious anemia   . Post-polio syndrome    minor problems L leg  . Restless leg syndrome 12/25/2015   Dr Tat  . Subdural hygroma 09/11/2017  . Swallowing disorder 03/04/2017  . Urinary incontinence 09/07/2008   Dr McDiarmid, Urology   . Vitamin B12 deficiency   . Weakness 06/24/2017    Past Surgical History:  Procedure Laterality Date  . BLADDER SUSPENSION  2000  . CATARACT EXTRACTION, BILATERAL     Dr. Gershon Crane  . no colonoscopy     "I never felt I needed one "  . YAG LASER APPLICATION Left    Dr. Gershon Crane    Social History   Socioeconomic History  . Marital status: Single    Spouse name: Not on file  . Number of children: Not on file  . Years of education: Not on file  . Highest education level: Not on file  Social Needs  . Financial resource strain: Not on file  . Food insecurity - worry: Not on file  . Food insecurity - inability: Not on file  . Transportation needs - medical: Not on file  . Transportation needs - non-medical: Not on file  Occupational History  . Not on file  Tobacco Use  . Smoking status: Never Smoker  . Smokeless tobacco: Never Used  Substance and Sexual Activity  . Alcohol use: Yes    Alcohol/week: 0.6 oz    Types: 1 Glasses of wine per week    Comment: 0.6 oz per week  . Drug use: No  .  Sexual activity: Not on file  Other Topics Concern  . Not on file  Social History Narrative  . Not on file    Family History  Problem Relation Age of Onset  . Heart attack Maternal Grandfather        70s  . Emphysema Mother   . Hypertension Mother   . Alzheimer's disease Father   . Breast cancer Maternal Grandmother   . Cancer Maternal Grandmother        breast  . Diabetes Neg Hx   . Stroke Neg Hx   . Parkinson's disease Neg Hx     Review of Systems  Objective:  There were no vitals filed for this visit. Wt Readings from Last 3 Encounters:  09/30/17 88 lb (39.9 kg)  09/15/17 90 lb (40.8 kg)  09/12/17 90 lb (40.8 kg)   There is no height or weight on file to calculate BMI.   Physical Exam    Constitutional: Appears well-developed and well-nourished. No distress.  HENT:  Head: Normocephalic and atraumatic.  Neck: Neck supple. No tracheal deviation present. No thyromegaly present.  No cervical lymphadenopathy Cardiovascular: Normal rate, regular rhythm and normal heart sounds.   No murmur heard. No carotid bruit .  No edema Pulmonary/Chest: Effort normal and breath sounds normal. No respiratory distress. No has no wheezes. No rales.  Skin: Skin is warm and dry. Not diaphoretic.  Psychiatric: Normal mood and affect. Behavior is normal.      Assessment & Plan:    See Problem List for Assessment and Plan of chronic medical problems.

## 2017-10-07 ENCOUNTER — Telehealth: Payer: Self-pay | Admitting: Neurology

## 2017-10-07 ENCOUNTER — Ambulatory Visit: Payer: Medicare Other | Admitting: Internal Medicine

## 2017-10-07 MED ORDER — PRAMIPEXOLE DIHYDROCHLORIDE 0.125 MG PO TABS: 0.1250 mg | ORAL_TABLET | Freq: Two times a day (BID) | ORAL | 0 refills | Status: DC

## 2017-10-07 NOTE — Telephone Encounter (Signed)
Two weeks of medication sent to pharmacy.

## 2017-10-07 NOTE — Telephone Encounter (Signed)
She is needing a refill on her Pramipexole medication. She has an appointment on 10/22/17 at 11:15. Thanks

## 2017-10-08 ENCOUNTER — Telehealth: Payer: Self-pay | Admitting: Internal Medicine

## 2017-10-08 DIAGNOSIS — M1991 Primary osteoarthritis, unspecified site: Secondary | ICD-10-CM | POA: Diagnosis not present

## 2017-10-08 DIAGNOSIS — N390 Urinary tract infection, site not specified: Secondary | ICD-10-CM | POA: Diagnosis not present

## 2017-10-08 DIAGNOSIS — M542 Cervicalgia: Secondary | ICD-10-CM | POA: Diagnosis not present

## 2017-10-08 DIAGNOSIS — G20 Parkinson's disease: Secondary | ICD-10-CM | POA: Diagnosis not present

## 2017-10-08 DIAGNOSIS — D181 Lymphangioma, any site: Secondary | ICD-10-CM | POA: Diagnosis not present

## 2017-10-08 DIAGNOSIS — Z8673 Personal history of transient ischemic attack (TIA), and cerebral infarction without residual deficits: Secondary | ICD-10-CM | POA: Diagnosis not present

## 2017-10-08 DIAGNOSIS — D510 Vitamin B12 deficiency anemia due to intrinsic factor deficiency: Secondary | ICD-10-CM | POA: Diagnosis not present

## 2017-10-08 DIAGNOSIS — Z9181 History of falling: Secondary | ICD-10-CM | POA: Diagnosis not present

## 2017-10-08 DIAGNOSIS — G2581 Restless legs syndrome: Secondary | ICD-10-CM | POA: Diagnosis not present

## 2017-10-08 NOTE — Telephone Encounter (Signed)
Jim [address]   Need verbals for OT  1 week 2 2 week 2 1 week 1

## 2017-10-08 NOTE — Telephone Encounter (Signed)
Needs verbals for PT for 2week4

## 2017-10-08 NOTE — Telephone Encounter (Signed)
LVM with Jim giving verbal orders per MD 

## 2017-10-09 NOTE — Telephone Encounter (Signed)
LVM with Flora giving verbal orders per MD.

## 2017-10-12 DIAGNOSIS — G2581 Restless legs syndrome: Secondary | ICD-10-CM | POA: Diagnosis not present

## 2017-10-12 DIAGNOSIS — D181 Lymphangioma, any site: Secondary | ICD-10-CM | POA: Diagnosis not present

## 2017-10-12 DIAGNOSIS — Z8673 Personal history of transient ischemic attack (TIA), and cerebral infarction without residual deficits: Secondary | ICD-10-CM | POA: Diagnosis not present

## 2017-10-12 DIAGNOSIS — Z9181 History of falling: Secondary | ICD-10-CM | POA: Diagnosis not present

## 2017-10-12 DIAGNOSIS — N390 Urinary tract infection, site not specified: Secondary | ICD-10-CM | POA: Diagnosis not present

## 2017-10-12 DIAGNOSIS — M542 Cervicalgia: Secondary | ICD-10-CM | POA: Diagnosis not present

## 2017-10-12 DIAGNOSIS — G20 Parkinson's disease: Secondary | ICD-10-CM | POA: Diagnosis not present

## 2017-10-12 DIAGNOSIS — D510 Vitamin B12 deficiency anemia due to intrinsic factor deficiency: Secondary | ICD-10-CM | POA: Diagnosis not present

## 2017-10-12 DIAGNOSIS — M1991 Primary osteoarthritis, unspecified site: Secondary | ICD-10-CM | POA: Diagnosis not present

## 2017-10-13 ENCOUNTER — Ambulatory Visit: Payer: Medicare Other | Admitting: Neurology

## 2017-10-15 DIAGNOSIS — M542 Cervicalgia: Secondary | ICD-10-CM | POA: Diagnosis not present

## 2017-10-15 DIAGNOSIS — G20 Parkinson's disease: Secondary | ICD-10-CM | POA: Diagnosis not present

## 2017-10-15 DIAGNOSIS — G2581 Restless legs syndrome: Secondary | ICD-10-CM | POA: Diagnosis not present

## 2017-10-15 DIAGNOSIS — Z9181 History of falling: Secondary | ICD-10-CM | POA: Diagnosis not present

## 2017-10-15 DIAGNOSIS — D510 Vitamin B12 deficiency anemia due to intrinsic factor deficiency: Secondary | ICD-10-CM | POA: Diagnosis not present

## 2017-10-15 DIAGNOSIS — Z8673 Personal history of transient ischemic attack (TIA), and cerebral infarction without residual deficits: Secondary | ICD-10-CM | POA: Diagnosis not present

## 2017-10-15 DIAGNOSIS — D181 Lymphangioma, any site: Secondary | ICD-10-CM | POA: Diagnosis not present

## 2017-10-15 DIAGNOSIS — N390 Urinary tract infection, site not specified: Secondary | ICD-10-CM | POA: Diagnosis not present

## 2017-10-15 DIAGNOSIS — M1991 Primary osteoarthritis, unspecified site: Secondary | ICD-10-CM | POA: Diagnosis not present

## 2017-10-19 DIAGNOSIS — Z8673 Personal history of transient ischemic attack (TIA), and cerebral infarction without residual deficits: Secondary | ICD-10-CM | POA: Diagnosis not present

## 2017-10-19 DIAGNOSIS — M542 Cervicalgia: Secondary | ICD-10-CM | POA: Diagnosis not present

## 2017-10-19 DIAGNOSIS — Z9181 History of falling: Secondary | ICD-10-CM | POA: Diagnosis not present

## 2017-10-19 DIAGNOSIS — N390 Urinary tract infection, site not specified: Secondary | ICD-10-CM | POA: Diagnosis not present

## 2017-10-19 DIAGNOSIS — D181 Lymphangioma, any site: Secondary | ICD-10-CM | POA: Diagnosis not present

## 2017-10-19 DIAGNOSIS — G2581 Restless legs syndrome: Secondary | ICD-10-CM | POA: Diagnosis not present

## 2017-10-19 DIAGNOSIS — M1991 Primary osteoarthritis, unspecified site: Secondary | ICD-10-CM | POA: Diagnosis not present

## 2017-10-19 DIAGNOSIS — G20 Parkinson's disease: Secondary | ICD-10-CM | POA: Diagnosis not present

## 2017-10-19 DIAGNOSIS — D510 Vitamin B12 deficiency anemia due to intrinsic factor deficiency: Secondary | ICD-10-CM | POA: Diagnosis not present

## 2017-10-20 DIAGNOSIS — D510 Vitamin B12 deficiency anemia due to intrinsic factor deficiency: Secondary | ICD-10-CM | POA: Diagnosis not present

## 2017-10-20 DIAGNOSIS — Z8673 Personal history of transient ischemic attack (TIA), and cerebral infarction without residual deficits: Secondary | ICD-10-CM | POA: Diagnosis not present

## 2017-10-20 DIAGNOSIS — G2581 Restless legs syndrome: Secondary | ICD-10-CM | POA: Diagnosis not present

## 2017-10-20 DIAGNOSIS — M542 Cervicalgia: Secondary | ICD-10-CM | POA: Diagnosis not present

## 2017-10-20 DIAGNOSIS — Z9181 History of falling: Secondary | ICD-10-CM | POA: Diagnosis not present

## 2017-10-20 DIAGNOSIS — N390 Urinary tract infection, site not specified: Secondary | ICD-10-CM | POA: Diagnosis not present

## 2017-10-20 DIAGNOSIS — D181 Lymphangioma, any site: Secondary | ICD-10-CM | POA: Diagnosis not present

## 2017-10-20 DIAGNOSIS — M1991 Primary osteoarthritis, unspecified site: Secondary | ICD-10-CM | POA: Diagnosis not present

## 2017-10-20 DIAGNOSIS — G20 Parkinson's disease: Secondary | ICD-10-CM | POA: Diagnosis not present

## 2017-10-21 ENCOUNTER — Telehealth: Payer: Self-pay | Admitting: Neurology

## 2017-10-21 ENCOUNTER — Ambulatory Visit: Payer: Medicare Other | Admitting: Internal Medicine

## 2017-10-21 DIAGNOSIS — M542 Cervicalgia: Secondary | ICD-10-CM | POA: Diagnosis not present

## 2017-10-21 DIAGNOSIS — Z8673 Personal history of transient ischemic attack (TIA), and cerebral infarction without residual deficits: Secondary | ICD-10-CM | POA: Diagnosis not present

## 2017-10-21 DIAGNOSIS — M1991 Primary osteoarthritis, unspecified site: Secondary | ICD-10-CM | POA: Diagnosis not present

## 2017-10-21 DIAGNOSIS — D181 Lymphangioma, any site: Secondary | ICD-10-CM | POA: Diagnosis not present

## 2017-10-21 DIAGNOSIS — G2581 Restless legs syndrome: Secondary | ICD-10-CM | POA: Diagnosis not present

## 2017-10-21 DIAGNOSIS — G20 Parkinson's disease: Secondary | ICD-10-CM | POA: Diagnosis not present

## 2017-10-21 DIAGNOSIS — D510 Vitamin B12 deficiency anemia due to intrinsic factor deficiency: Secondary | ICD-10-CM | POA: Diagnosis not present

## 2017-10-21 DIAGNOSIS — N390 Urinary tract infection, site not specified: Secondary | ICD-10-CM | POA: Diagnosis not present

## 2017-10-21 DIAGNOSIS — Z9181 History of falling: Secondary | ICD-10-CM | POA: Diagnosis not present

## 2017-10-21 NOTE — Telephone Encounter (Signed)
I reviewed records (for over 40 min) preparing for tomorrows appointment.  She stopped her rytary and has fallen many times.  I cannot fill her pramipexole without being seen as I am not sure that she should be on this medication any longer.

## 2017-10-21 NOTE — Telephone Encounter (Signed)
Tried to call back with no answer. Will try again later.

## 2017-10-21 NOTE — Progress Notes (Deleted)
Elizabeth [name] was seen today in the movement disorders clinic for neurologic consultation at the request of Burns, Claudina Lick, MD.  The consultation is for the evaluation of tremor and to r/o PD.  The tremor is in the L hand and is noted if she picks up something.  She is right hand dominant.  She has noted tremor for max of 2 years.  It really has not changed with time.  No family hx of tremor.  Denies leg tremor or head tremor.     Specific Symptoms:  Tremor: Yes.     Affected by caffeine:  No. (1 cup coffee per day)  Affected by alcohol:  No. (only drinks at christmas)  Affected by stress:  Yes.    Affected by fatigue:  Yes.    Spills soup if on spoon:  No. (involves non dominant hand)  Spills glass of liquid if full:  No. (involves non dominant hand)  Affects ADL's (tying shoes, brushing teeth, etc):  No.   By far her biggest c/o is inability to sit comfortably because of the need to move the legs when sitting.  "If I don't move them, I feel like I will scream."  It generally happens when she tries to watch TV.  She was told by her dermatologist that it was due to venous insufficiency.  If she gets up and moves around the sx's are better.  She can sit and play bridge and scrabble for hours without problem but cannot watch TV without getting up and moving around  Any other sx's:   Voice: no change Sleep: trouble getting asleep because legs are not comfortable; "part of it may be my nervous"  Vivid Dreams:  Yes.  , "I feel like my mother is in the room with me and she is deceased"  Acting out dreams:  No., but "I live by myself" Wet Pillows: No. Postural symptoms:  Yes.    Falls?  Yes.  ; went to ED on 12/13/14 after a fall between the commode and the wall and she hit her head.  She drove herself to the ED.  States that she had a similar fall in 2013. Bradykinesia symptoms: difficulty getting out of a chair Loss of smell:  No. Loss of taste:  No. Urinary Incontinence:  Yes.  , bladder  suspension 20 years ago but gotten worse again (on myrbetriq) Difficulty Swallowing:  No. Handwriting, micrographia: No. Trouble buttoning clothing: Yes.   Depression:  Yes.   (worse since retirement) Memory changes:  No. Hallucinations:  No.  visual distortions: No. N/V:  No. Lightheaded:  No.  Syncope: No. Diplopia:  No. Dyskinesia:  No.   09/13/15 update:  The patient returns today for follow-up.  Last visit, I started her on low-dose Mirapex, 0.125 mg for her restless leg. Pt states that the medication really helped and "I need it for the rest of my days."   She had some blood work and she was anemic with a hemoglobin of 11.  I had intended to have her iron stores/TIBC drawn but for some reason that was not done.  Neuroimaging has previously been performed.  It is available for my review today.  Had a CT brain in the ED in Jan 2016 after a fall.  There was an old infact in the R occipito-parietal region and L caudate region; there was small vessel disease and atrophy.  03/13/16 update:  The patient is following up today and has a history  of restless leg syndrome.  She is on Mirapex, 0.125 mg.  She was slightly anemic when I checked her hemoglobin last October (1.6) and her primary care physician rechecked this in February and her hemoglobin was 11.0.  Overall, this has been stable for quite some time.  Her iron stores and iron saturation were good in October.  With the medication, she has been sleeping well.  Her biggest c/o is neck pain x several months.  She saw Dr. Noemi Chapel and states that she had a cortisone injection but that was in the right shoulder.  Sometimes, the pain radiates in the neck.  X rays were done of the cervical spine but no other neuroimaging.  No PT for the neck.     04/24/16 update:  The patient is following up today.  Last visit, she looked more parkinsonian than previous visits, so I decided to start her on a trial of carbidopa/levodopa 25/100, one tablet 3 times per day.   Pt states this definitely helped.   She is also on Mirapex 0.125 mg at night.  She states that it is helping but is having some sx during the day and it drives her nuts; the last time she played bingo, she had to keep getting up.  She denies any falls since last visit.  No lightheadedness or near syncope.  No hallucinations.  She did have an MRI of the cervical spine that demonstrated a disc protrusion at C5-C6 level.  There was bilateral moderate neural foraminal stenosis at the C3/C4 level and C5/C6 level.  She was referred to physical therapy.  She has an appt upcoming.  States that she has had "neck popping" but no significant pain.  Asks me for a neck brace because she feels that she is humping over some and has trouble keeping neck up.    09/18/16 update:  The patient follows up today, on carbidopa/levodopa 25/100, one tablet 3 times per day.  She is also on pramipexole, 0.125 mg twice a day for restless leg syndrome.  She denies any cognitive change.  She denies compulsive behaviors.  She denies sleep attacks.  She denies falls.  She denies lightheadedness or near syncope.  No hallucinations.  He has been attending physical therapy for cervicalgia since our last visit.  It helped that and her balance and strength.  She states "I need more."  I have reviewed those records.  01/08/17 update:   Patient follows up today.  The patient is on carbidopa/levodopa, one tablet 3 times per day.  She is having trouble swallowing this and "I about gag."  Asks me if there is something else she can try.   She is also on pramipexole 0.125 mg twice per day for restless leg.  Overall, the patient states that she has been doing well since our last visit.  She denies lightheadedness or near syncope.  No hallucinations.  No cognitive change.  No falls.  10/22/17 update: Patient seen in follow-up today for her Parkinson's disease.  I have not seen her since February as she has canceled or no showed multiple appointments since  last visit.  Last visit, I tried to change her from immediate release carbidopa/levodopa 25/100 to rytary 145 mg 3 times per day.  This is primarily because of swallowing issues with immediate release medication.  I have not heard from her since I did this, but she returns today off of all forms of levodopa.  She states that ***.  I have reviewed an extensive  number of records since her last visit.  She was in the emergency room in July because of a fall when she was in the bathroom.  It turns out that the patient had a urinary tract infection.  It was also revealed that the patient was only taking her rytary once per day.  Home health went to her house and the patient was refusing her medication.  On June 29, 2017, a fall was noted in her medical records.  The patient went to the emergency room on September 11, 2017 after a fall and required sutures.  She was admitted on that day because of frequent falls.  CT revealed a small left-sided subdural hygroma, but notes indicated that neurosurgery felt that this was stable.  Patient admitted that she was not taking her rytary.  She was discharged to [facility] for skilled rehab and just recently discharged home.  The only neurologic medication that she is on is pramipexole, 0.125 mg twice per day and that is for restless leg.  ALLERGIES:   Allergies  Allergen Reactions  . Atorvastatin     REACTION: Felt funny (only way patient could describe)  . Crestor [Rosuvastatin Calcium]     Leg cramps, muscle aches  . Vesicare [Solifenacin]     constipation    CURRENT MEDICATIONS:  Outpatient Encounter Medications as of 10/22/2017  Medication Sig  . acetaminophen (TYLENOL) 325 MG tablet Take 650 mg by mouth. Take 2 tablets every 6 hours as needed for pain  . aspirin 81 MG tablet Take 81 mg by mouth daily.    . Calcium Carbonate-Vitamin D (CALCIUM + D PO) Take 1 tablet by mouth daily. Reported on 12/25/2015  . cholecalciferol (VITAMIN D) 1000 UNITS tablet Take  1,000 Units by mouth daily.    Marland Kitchen levothyroxine (SYNTHROID, LEVOTHROID) 75 MCG tablet Take 0.5 tablets (37.5 mcg total) by mouth daily before breakfast.  . Nutritional Supplements (NUTRITIONAL DRINK PO) Take by mouth. Medpass, 4 oz daily to support weight status  . pramipexole (MIRAPEX) 0.125 MG tablet Take 1 tablet (0.125 mg total) 2 (two) times daily by mouth.  . Simethicone (GAS-X EXTRA STRENGTH) 125 MG CAPS Take 125 mg by mouth daily as needed (gas).  . tolterodine (DETROL LA) 4 MG 24 hr capsule Take 4 mg by mouth daily.   . vitamin B-12 (CYANOCOBALAMIN) 1000 MCG tablet Take 1 tablet (1,000 mcg total) by mouth daily.   No facility-administered encounter medications on file as of 10/22/2017.     PAST MEDICAL HISTORY:   Past Medical History:  Diagnosis Date  . Abnormal Pap smear of vagina 09/2006   Dr Nori Riis  . Abnormality of gait 05/24/2015   PMH R occipital infarct B12 deficiency H/o polio affected left leg  In Physical Therapy to improve balance & strength through Noel Gerold  . Arthritis 12/25/2015  . Cervicalgia 01/17/2009   MRI 03/27/16:  IMPRESSION: 1. At C3-4 there is a mild broad-based disc bulge. Moderate bilateral facet arthropathy and uncovertebral degenerative changes resulting in moderate bilateral foraminal stenosis. 2. At C5-6 there is a broad-based disc osteophyte complex. Bilateral uncovertebral degenerative changes and facet arthropathy resulting in moderate bilateral foraminal stenosis.  . Fall 09/11/2017  . History of CVA (cerebrovascular accident) 07/20/2012   Chronic R occipital infarct ; stable on CT 05/21/2012 .Dr Jannifer Franklin , Neurology   . History of TIA (transient ischemic attack) 01/26/2008  . HYPERLIPIDEMIA 01/26/2008   NMR Lipoprofile 2009: LDL 135 (1704/911), HDL 62, TG 131. LDL goal = <  100; ideally < 70. MGF MI in 13s No FH CVA  . Hypothyroidism   . Intention tremor 12/08/2013   Onset after fall 04/2012   . Irritable bowel syndrome (IBS) 01/17/2009   Dr. Unice Cobble   . Osteopenia    PMH fracture heel, wrist  . Parkinson disease ([place]) 06/25/2016   Dr Yumna Ebers  . Pernicious anemia   . Post-polio syndrome    minor problems L leg  . Restless leg syndrome 12/25/2015   Dr Fredricka Kohrs  . Subdural hygroma 09/11/2017  . Swallowing disorder 03/04/2017  . Urinary incontinence 09/07/2008   Dr McDiarmid, Urology   . Vitamin B12 deficiency   . Weakness 06/24/2017    PAST SURGICAL HISTORY:   Past Surgical History:  Procedure Laterality Date  . BLADDER SUSPENSION  2000  . CATARACT EXTRACTION, BILATERAL     Dr. Gershon Crane  . no colonoscopy     "I never felt I needed one "  . YAG LASER APPLICATION Left    Dr. Gershon Crane    SOCIAL HISTORY:   Social History   Socioeconomic History  . Marital status: Single    Spouse name: Not on file  . Number of children: Not on file  . Years of education: Not on file  . Highest education level: Not on file  Social Needs  . Financial resource strain: Not on file  . Food insecurity - worry: Not on file  . Food insecurity - inability: Not on file  . Transportation needs - medical: Not on file  . Transportation needs - non-medical: Not on file  Occupational History  . Not on file  Tobacco Use  . Smoking status: Never Smoker  . Smokeless tobacco: Never Used  Substance and Sexual Activity  . Alcohol use: Yes    Alcohol/week: 0.6 oz    Types: 1 Glasses of wine per week    Comment: 0.6 oz per week  . Drug use: No  . Sexual activity: Not on file  Other Topics Concern  . Not on file  Social History Narrative  . Not on file    FAMILY HISTORY:   Family Status  Relation Name Status  . MGF  Deceased at age 27  . Mother  Deceased       emphysema, HTN  . Father  Deceased       alzheimer's  . Brother  Deceased       drown  . MGM  (Not Specified)  . Neg Hx  (Not Specified)    ROS:  A complete 10 system review of systems was obtained and was unremarkable apart from what is mentioned above.  PHYSICAL EXAMINATION:    VITALS:     There were no vitals filed for this visit.  GEN:  The patient appears stated age and is in NAD. HEENT:  Normocephalic, atraumatic.  The mucous membranes are moist. The superficial temporal arteries are without ropiness or tenderness. CV:  RRR Lungs:  CTAB Neck/HEME:  There are no carotid bruits bilaterally.  Neurological examination:  Orientation: The patient is alert and oriented x3.  Cranial nerves: There is good facial symmetry. There is facial hypomimia. The visual fields are full to confrontational testing. The speech is fluent and clear but hypophonic. Soft palate rises symmetrically and there is no tongue deviation. Hearing is intact to conversational tone. Sensation: Sensation is intact to light touch throughout Motor: Strength is 5/5 in the bilateral upper and lower extremities.   Shoulder shrug is equal and symmetric.  There is no pronator drift.   Movement examination: Tone: There is normal tone in the bilateral upper extremities.  The tone in the lower extremities is  normal.  Abnormal movements:  She has a L hand rest tremor that is mild Coordination:  There is some difficulty with hand opening and closing bilaterally and finger taps more on the L. Gait and Station: The patient has minimal difficulty arising out of a deep-seated chair without the use of the hands.  She is short stepped.  No left hand tremor today and slightly drags the L leg with ambulation (states had polio as a child)  LABS  Lab Results  Component Value Date   TSH 7.416 (H) 09/11/2017     Chemistry      Component Value Date/Time   NA 136 09/14/2017 0454   K 4.2 09/14/2017 0454   CL 106 09/14/2017 0454   CO2 25 09/14/2017 0454   BUN 6 09/14/2017 0454   CREATININE 0.65 09/14/2017 0454      Component Value Date/Time   CALCIUM 7.9 (L) 09/14/2017 0454   ALKPHOS 111 09/11/2017 1738   AST 53 (H) 09/11/2017 1738   ALT 54 09/11/2017 1738   BILITOT 1.4 (H) 09/11/2017 1738     Lab Results   Component Value Date   WBC 4.8 09/14/2017   HGB 10.0 (L) 09/14/2017   HCT 30.0 (L) 09/14/2017   MCV 82.6 09/14/2017   PLT 208 09/14/2017   Lab Results  Component Value Date   IRON 61 09/18/2016   TIBC 281 09/18/2016   FERRITIN 98 09/18/2016     Chemistry      Component Value Date/Time   NA 136 09/14/2017 0454   K 4.2 09/14/2017 0454   CL 106 09/14/2017 0454   CO2 25 09/14/2017 0454   BUN 6 09/14/2017 0454   CREATININE 0.65 09/14/2017 0454      Component Value Date/Time   CALCIUM 7.9 (L) 09/14/2017 0454   ALKPHOS 111 09/11/2017 1738   AST 53 (H) 09/11/2017 1738   ALT 54 09/11/2017 1738   BILITOT 1.4 (H) 09/11/2017 1738       ASSESSMENT/PLAN:  1.  Parkinsons disease  -***She has had multiple falls since our last visit.  This is primarily due to noncompliance, both with follow-up visits as well as medication.  She had stopped all levodopa.  She and I talked about the importance of compliance.  We talked about the risks of falls and the morbidity and mortality associated with these.  She had trouble swallowing immediate release levodopa, so we had previously switched her to Rytary, which she did not take.  I do not think she is safe to live alone.  Home health reported that she refused help with medication and even refused to allow someone to help her with her pillbox.   2.  Restless leg syndrome  -Is much better with mirapex, 0.125 mg bid. ***  -wonder if anemia contributing.  Will recheck and recheck iron stores 3.  ***This did not include the 40 min of record review which was detailed above, which was non face to face time.

## 2017-10-21 NOTE — Telephone Encounter (Signed)
Patient last seen on 01/08/17.  She cancelled appts on 04/09/17 and 04/16/17. She no showed an appt on 07/14/17.  She now just cancelled and rescheduled her appt for tomorrow 10/22/17.  Please advise on refilling medication.

## 2017-10-21 NOTE — Telephone Encounter (Signed)
Pt rescheduled her appointment and needs a prescription for Mirapex

## 2017-10-22 ENCOUNTER — Ambulatory Visit: Payer: Medicare Other | Admitting: Neurology

## 2017-10-23 DIAGNOSIS — Z9181 History of falling: Secondary | ICD-10-CM | POA: Diagnosis not present

## 2017-10-23 DIAGNOSIS — G2581 Restless legs syndrome: Secondary | ICD-10-CM | POA: Diagnosis not present

## 2017-10-23 DIAGNOSIS — M542 Cervicalgia: Secondary | ICD-10-CM | POA: Diagnosis not present

## 2017-10-23 DIAGNOSIS — D510 Vitamin B12 deficiency anemia due to intrinsic factor deficiency: Secondary | ICD-10-CM | POA: Diagnosis not present

## 2017-10-23 DIAGNOSIS — G20 Parkinson's disease: Secondary | ICD-10-CM | POA: Diagnosis not present

## 2017-10-23 DIAGNOSIS — Z8673 Personal history of transient ischemic attack (TIA), and cerebral infarction without residual deficits: Secondary | ICD-10-CM | POA: Diagnosis not present

## 2017-10-23 DIAGNOSIS — N390 Urinary tract infection, site not specified: Secondary | ICD-10-CM | POA: Diagnosis not present

## 2017-10-23 DIAGNOSIS — D181 Lymphangioma, any site: Secondary | ICD-10-CM | POA: Diagnosis not present

## 2017-10-23 DIAGNOSIS — M1991 Primary osteoarthritis, unspecified site: Secondary | ICD-10-CM | POA: Diagnosis not present

## 2017-10-27 DIAGNOSIS — D181 Lymphangioma, any site: Secondary | ICD-10-CM | POA: Diagnosis not present

## 2017-10-27 DIAGNOSIS — N390 Urinary tract infection, site not specified: Secondary | ICD-10-CM | POA: Diagnosis not present

## 2017-10-27 DIAGNOSIS — Z8673 Personal history of transient ischemic attack (TIA), and cerebral infarction without residual deficits: Secondary | ICD-10-CM | POA: Diagnosis not present

## 2017-10-27 DIAGNOSIS — D510 Vitamin B12 deficiency anemia due to intrinsic factor deficiency: Secondary | ICD-10-CM | POA: Diagnosis not present

## 2017-10-27 DIAGNOSIS — M542 Cervicalgia: Secondary | ICD-10-CM | POA: Diagnosis not present

## 2017-10-27 DIAGNOSIS — G2581 Restless legs syndrome: Secondary | ICD-10-CM | POA: Diagnosis not present

## 2017-10-27 DIAGNOSIS — G20 Parkinson's disease: Secondary | ICD-10-CM | POA: Diagnosis not present

## 2017-10-27 DIAGNOSIS — M1991 Primary osteoarthritis, unspecified site: Secondary | ICD-10-CM | POA: Diagnosis not present

## 2017-10-27 DIAGNOSIS — Z9181 History of falling: Secondary | ICD-10-CM | POA: Diagnosis not present

## 2017-11-04 ENCOUNTER — Ambulatory Visit: Payer: Medicare Other | Admitting: Internal Medicine

## 2017-11-04 DIAGNOSIS — N390 Urinary tract infection, site not specified: Secondary | ICD-10-CM | POA: Diagnosis not present

## 2017-11-04 DIAGNOSIS — D510 Vitamin B12 deficiency anemia due to intrinsic factor deficiency: Secondary | ICD-10-CM | POA: Diagnosis not present

## 2017-11-04 DIAGNOSIS — Z8673 Personal history of transient ischemic attack (TIA), and cerebral infarction without residual deficits: Secondary | ICD-10-CM | POA: Diagnosis not present

## 2017-11-04 DIAGNOSIS — M1991 Primary osteoarthritis, unspecified site: Secondary | ICD-10-CM | POA: Diagnosis not present

## 2017-11-04 DIAGNOSIS — Z9181 History of falling: Secondary | ICD-10-CM | POA: Diagnosis not present

## 2017-11-04 DIAGNOSIS — D181 Lymphangioma, any site: Secondary | ICD-10-CM | POA: Diagnosis not present

## 2017-11-04 DIAGNOSIS — G20 Parkinson's disease: Secondary | ICD-10-CM | POA: Diagnosis not present

## 2017-11-04 DIAGNOSIS — M542 Cervicalgia: Secondary | ICD-10-CM | POA: Diagnosis not present

## 2017-11-04 DIAGNOSIS — G2581 Restless legs syndrome: Secondary | ICD-10-CM | POA: Diagnosis not present

## 2017-11-05 DIAGNOSIS — M542 Cervicalgia: Secondary | ICD-10-CM | POA: Diagnosis not present

## 2017-11-05 DIAGNOSIS — Z8673 Personal history of transient ischemic attack (TIA), and cerebral infarction without residual deficits: Secondary | ICD-10-CM | POA: Diagnosis not present

## 2017-11-05 DIAGNOSIS — G2581 Restless legs syndrome: Secondary | ICD-10-CM | POA: Diagnosis not present

## 2017-11-05 DIAGNOSIS — D510 Vitamin B12 deficiency anemia due to intrinsic factor deficiency: Secondary | ICD-10-CM | POA: Diagnosis not present

## 2017-11-05 DIAGNOSIS — G20 Parkinson's disease: Secondary | ICD-10-CM | POA: Diagnosis not present

## 2017-11-05 DIAGNOSIS — D181 Lymphangioma, any site: Secondary | ICD-10-CM | POA: Diagnosis not present

## 2017-11-05 DIAGNOSIS — Z9181 History of falling: Secondary | ICD-10-CM | POA: Diagnosis not present

## 2017-11-05 DIAGNOSIS — M1991 Primary osteoarthritis, unspecified site: Secondary | ICD-10-CM | POA: Diagnosis not present

## 2017-11-05 DIAGNOSIS — N390 Urinary tract infection, site not specified: Secondary | ICD-10-CM | POA: Diagnosis not present

## 2017-11-06 ENCOUNTER — Telehealth: Payer: Self-pay | Admitting: Internal Medicine

## 2017-11-06 NOTE — Telephone Encounter (Signed)
Notified PT w/MD response../lmb 

## 2017-11-06 NOTE — Telephone Encounter (Signed)
Copied from [place]. Topic: General - Other >> Nov 06, 2017 11:43 AM Aurelio Brash B wrote: Orland Mustard PT from [facility]  wants to extend PT to 1x a week for 3 weeks  call back number [phone]

## 2017-11-06 NOTE — Telephone Encounter (Signed)
ok 

## 2017-11-06 NOTE — Telephone Encounter (Signed)
Pls advise if ok../lmb 

## 2017-11-09 DIAGNOSIS — G2581 Restless legs syndrome: Secondary | ICD-10-CM | POA: Diagnosis not present

## 2017-11-09 DIAGNOSIS — Z8673 Personal history of transient ischemic attack (TIA), and cerebral infarction without residual deficits: Secondary | ICD-10-CM | POA: Diagnosis not present

## 2017-11-09 DIAGNOSIS — M1991 Primary osteoarthritis, unspecified site: Secondary | ICD-10-CM | POA: Diagnosis not present

## 2017-11-09 DIAGNOSIS — N390 Urinary tract infection, site not specified: Secondary | ICD-10-CM | POA: Diagnosis not present

## 2017-11-09 DIAGNOSIS — G20 Parkinson's disease: Secondary | ICD-10-CM | POA: Diagnosis not present

## 2017-11-09 DIAGNOSIS — D181 Lymphangioma, any site: Secondary | ICD-10-CM | POA: Diagnosis not present

## 2017-11-09 DIAGNOSIS — Z9181 History of falling: Secondary | ICD-10-CM | POA: Diagnosis not present

## 2017-11-09 DIAGNOSIS — M542 Cervicalgia: Secondary | ICD-10-CM | POA: Diagnosis not present

## 2017-11-09 DIAGNOSIS — D510 Vitamin B12 deficiency anemia due to intrinsic factor deficiency: Secondary | ICD-10-CM | POA: Diagnosis not present

## 2017-11-09 NOTE — Progress Notes (Signed)
Elizabeth [name] was seen today in the movement disorders clinic for neurologic consultation at the request of Burns, Claudina Lick, MD.  The consultation is for the evaluation of tremor and to r/o PD.  The tremor is in the L hand and is noted if she picks up something.  She is right hand dominant.  She has noted tremor for max of 2 years.  It really has not changed with time.  No family hx of tremor.  Denies leg tremor or head tremor.     Specific Symptoms:  Tremor: Yes.     Affected by caffeine:  No. (1 cup coffee per day)  Affected by alcohol:  No. (only drinks at christmas)  Affected by stress:  Yes.    Affected by fatigue:  Yes.    Spills soup if on spoon:  No. (involves non dominant hand)  Spills glass of liquid if full:  No. (involves non dominant hand)  Affects ADL's (tying shoes, brushing teeth, etc):  No.   By far her biggest c/o is inability to sit comfortably because of the need to move the legs when sitting.  "If I don't move them, I feel like I will scream."  It generally happens when she tries to watch TV.  She was told by her dermatologist that it was due to venous insufficiency.  If she gets up and moves around the sx's are better.  She can sit and play bridge and scrabble for hours without problem but cannot watch TV without getting up and moving around  Any other sx's:   Voice: no change Sleep: trouble getting asleep because legs are not comfortable; "part of it may be my nervous"  Vivid Dreams:  Yes.  , "I feel like my mother is in the room with me and she is deceased"  Acting out dreams:  No., but "I live by myself" Wet Pillows: No. Postural symptoms:  Yes.    Falls?  Yes.  ; went to ED on 12/13/14 after a fall between the commode and the wall and she hit her head.  She drove herself to the ED.  States that she had a similar fall in 2013. Bradykinesia symptoms: difficulty getting out of a chair Loss of smell:  No. Loss of taste:  No. Urinary Incontinence:  Yes.  , bladder  suspension 20 years ago but gotten worse again (on myrbetriq) Difficulty Swallowing:  No. Handwriting, micrographia: No. Trouble buttoning clothing: Yes.   Depression:  Yes.   (worse since retirement) Memory changes:  No. Hallucinations:  No.  visual distortions: No. N/V:  No. Lightheaded:  No.  Syncope: No. Diplopia:  No. Dyskinesia:  No.   09/13/15 update:  The patient returns today for follow-up.  Last visit, I started her on low-dose Mirapex, 0.125 mg for her restless leg. Pt states that the medication really helped and "I need it for the rest of my days."   She had some blood work and she was anemic with a hemoglobin of 11.  I had intended to have her iron stores/TIBC drawn but for some reason that was not done.  Neuroimaging has previously been performed.  It is available for my review today.  Had a CT brain in the ED in Jan 2016 after a fall.  There was an old infact in the R occipito-parietal region and L caudate region; there was small vessel disease and atrophy.  03/13/16 update:  The patient is following up today and has a history  of restless leg syndrome.  She is on Mirapex, 0.125 mg.  She was slightly anemic when I checked her hemoglobin last October (1.6) and her primary care physician rechecked this in February and her hemoglobin was 11.0.  Overall, this has been stable for quite some time.  Her iron stores and iron saturation were good in October.  With the medication, she has been sleeping well.  Her biggest c/o is neck pain x several months.  She saw Dr. Noemi Chapel and states that she had a cortisone injection but that was in the right shoulder.  Sometimes, the pain radiates in the neck.  X rays were done of the cervical spine but no other neuroimaging.  No PT for the neck.     04/24/16 update:  The patient is following up today.  Last visit, she looked more parkinsonian than previous visits, so I decided to start her on a trial of carbidopa/levodopa 25/100, one tablet 3 times per day.   Pt states this definitely helped.   She is also on Mirapex 0.125 mg at night.  She states that it is helping but is having some sx during the day and it drives her nuts; the last time she played bingo, she had to keep getting up.  She denies any falls since last visit.  No lightheadedness or near syncope.  No hallucinations.  She did have an MRI of the cervical spine that demonstrated a disc protrusion at C5-C6 level.  There was bilateral moderate neural foraminal stenosis at the C3/C4 level and C5/C6 level.  She was referred to physical therapy.  She has an appt upcoming.  States that she has had "neck popping" but no significant pain.  Asks me for a neck brace because she feels that she is humping over some and has trouble keeping neck up.    09/18/16 update:  The patient follows up today, on carbidopa/levodopa 25/100, one tablet 3 times per day.  She is also on pramipexole, 0.125 mg twice a day for restless leg syndrome.  She denies any cognitive change.  She denies compulsive behaviors.  She denies sleep attacks.  She denies falls.  She denies lightheadedness or near syncope.  No hallucinations.  He has been attending physical therapy for cervicalgia since our last visit.  It helped that and her balance and strength.  She states "I need more."  I have reviewed those records.  01/08/17 update:   Patient follows up today.  The patient is on carbidopa/levodopa, one tablet 3 times per day.  She is having trouble swallowing this and "I about gag."  Asks me if there is something else she can try.   She is also on pramipexole 0.125 mg twice per day for restless leg.  Overall, the patient states that she has been doing well since our last visit.  She denies lightheadedness or near syncope.  No hallucinations.  No cognitive change.  No falls.  11/10/17 update: Patient seen in follow-up today for her Parkinson's disease. She is accompanied by her nephew who supplements the history.   I have not seen her since  February as she has canceled or no showed multiple appointments since last visit.  Last visit, I tried to change her from immediate release carbidopa/levodopa 25/100 to rytary 145 mg 3 times per day.  This is primarily because of swallowing issues with immediate release medication.  I have not heard from her since I did this.  I have reviewed an extensive number of records since  her last visit.  She was in the emergency room in July because of a fall when she was in the bathroom.  It turns out that the patient had a urinary tract infection.  It was also revealed that the patient was only taking her rytary once per day.  Home health went to her house and the patient was refusing her medication.  On June 29, 2017, a fall was noted in her medical records.  The patient went to the emergency room on September 11, 2017 after a fall and required sutures.  She was admitted on that day because of frequent falls.  CT revealed a small left-sided subdural hygroma, but notes indicated that neurosurgery felt that this was stable.  Patient admitted that she was not taking her rytary.  She was discharged to [facility] for skilled rehab and just recently discharged home.  The only neurologic medication that she was on then was pramipexole, 0.125 mg twice per day and that is for restless leg.  She reports today that "I thought that I was taking my levodopa."  She admits that it may be once per day.  Also admits that she is still using the sample bottle I gave her a year ago.  Does state that rytary is easier to swallow than carbidopa/levodopa IR.   Lives alone.  Has someone come in on fridays to assist with bathing.  Had someone come into the home to lower the phone and coat closet hangers.  Has trouble getting out of the chair.  ALLERGIES:   Allergies  Allergen Reactions  . Atorvastatin     REACTION: Felt funny (only way patient could describe)  . Crestor [Rosuvastatin Calcium]     Leg cramps, muscle aches  . Vesicare  [Solifenacin]     constipation    CURRENT MEDICATIONS:  Outpatient Encounter Medications as of 11/10/2017  Medication Sig  . AMBULATORY NON FORMULARY MEDICATION Lift Chair DX: G20  . aspirin 81 MG tablet Take 81 mg by mouth daily.    . Calcium Carbonate-Vitamin D (CALCIUM + D PO) Take 1 tablet by mouth daily. Reported on 12/25/2015  . Carbidopa-Levodopa ER (RYTARY) 36.25-145 MG CPCR Take 1 tablet by mouth 3 (three) times daily.  . cholecalciferol (VITAMIN D) 1000 UNITS tablet Take 1,000 Units by mouth daily.    Marland Kitchen levothyroxine (SYNTHROID, LEVOTHROID) 75 MCG tablet Take 0.5 tablets (37.5 mcg total) by mouth daily before breakfast.  . pramipexole (MIRAPEX) 0.125 MG tablet Take 1 tablet (0.125 mg total) by mouth at bedtime.  . Simethicone (GAS-X EXTRA STRENGTH) 125 MG CAPS Take 125 mg by mouth daily as needed (gas).  . tolterodine (DETROL LA) 4 MG 24 hr capsule Take 4 mg by mouth daily.   . vitamin B-12 (CYANOCOBALAMIN) 1000 MCG tablet Take 1 tablet (1,000 mcg total) by mouth daily.  . [DISCONTINUED] acetaminophen (TYLENOL) 325 MG tablet Take 650 mg by mouth. Take 2 tablets every 6 hours as needed for pain  . [DISCONTINUED] Nutritional Supplements (NUTRITIONAL DRINK PO) Take by mouth. Medpass, 4 oz daily to support weight status  . [DISCONTINUED] pramipexole (MIRAPEX) 0.125 MG tablet Take 1 tablet (0.125 mg total) 2 (two) times daily by mouth. (Patient taking differently: Take 0.125 mg by mouth 2 (two) times daily. Usually only at night)   No facility-administered encounter medications on file as of 11/10/2017.     PAST MEDICAL HISTORY:   Past Medical History:  Diagnosis Date  . Abnormal Pap smear of vagina 09/2006  Dr Nori Riis  . Abnormality of gait 05/24/2015   PMH R occipital infarct B12 deficiency H/o polio affected left leg  In Physical Therapy to improve balance & strength through [facility]  . Arthritis 12/25/2015  . Cervicalgia 01/17/2009   MRI 03/27/16:  IMPRESSION: 1. At C3-4 there  is a mild broad-based disc bulge. Moderate bilateral facet arthropathy and uncovertebral degenerative changes resulting in moderate bilateral foraminal stenosis. 2. At C5-6 there is a broad-based disc osteophyte complex. Bilateral uncovertebral degenerative changes and facet arthropathy resulting in moderate bilateral foraminal stenosis.  . Fall 09/11/2017  . History of CVA (cerebrovascular accident) 07/20/2012   Chronic R occipital infarct ; stable on CT 05/21/2012 .Dr Jannifer Franklin , Neurology   . History of TIA (transient ischemic attack) 01/26/2008  . HYPERLIPIDEMIA 01/26/2008   NMR Lipoprofile 2009: LDL 135 (1704/911), HDL 62, TG 131. LDL goal = < 100; ideally < 70. MGF MI in 55s No FH CVA  . Hypothyroidism   . Intention tremor 12/08/2013   Onset after fall 04/2012   . Irritable bowel syndrome (IBS) 01/17/2009   Dr. Unice Cobble  . Osteopenia    PMH fracture heel, wrist  . Parkinson disease ([place]) 06/25/2016   Dr Alishia Lebo  . Pernicious anemia   . Post-polio syndrome    minor problems L leg  . Restless leg syndrome 12/25/2015   Dr Elmor Kost  . Subdural hygroma 09/11/2017  . Swallowing disorder 03/04/2017  . Urinary incontinence 09/07/2008   Dr McDiarmid, Urology   . Vitamin B12 deficiency   . Weakness 06/24/2017    PAST SURGICAL HISTORY:   Past Surgical History:  Procedure Laterality Date  . BLADDER SUSPENSION  2000  . CATARACT EXTRACTION, BILATERAL     Dr. Gershon Crane  . no colonoscopy     "I never felt I needed one "  . YAG LASER APPLICATION Left    Dr. Gershon Crane    SOCIAL HISTORY:   Social History   Socioeconomic History  . Marital status: Single    Spouse name: Not on file  . Number of children: Not on file  . Years of education: Not on file  . Highest education level: Not on file  Social Needs  . Financial resource strain: Not on file  . Food insecurity - worry: Not on file  . Food insecurity - inability: Not on file  . Transportation needs - medical: Not on file  . Transportation needs -  non-medical: Not on file  Occupational History  . Not on file  Tobacco Use  . Smoking status: Never Smoker  . Smokeless tobacco: Never Used  Substance and Sexual Activity  . Alcohol use: Yes    Alcohol/week: 0.6 oz    Types: 1 Glasses of wine per week    Comment: 0.6 oz per week  . Drug use: No  . Sexual activity: Not on file  Other Topics Concern  . Not on file  Social History Narrative  . Not on file    FAMILY HISTORY:   Family Status  Relation Name Status  . MGF  Deceased at age 71  . Mother  Deceased       emphysema, HTN  . Father  Deceased       alzheimer's  . Brother  Deceased       drown  . MGM  (Not Specified)  . Neg Hx  (Not Specified)    ROS:  A complete 10 system review of systems was obtained and was unremarkable apart  from what is mentioned above.  PHYSICAL EXAMINATION:    VITALS:   Vitals:   11/10/17 1438  BP: 128/78  Pulse: 78  SpO2: 96%  Weight: 88 lb (39.9 kg)  Height: 4\' 10"  (1.473 m)    GEN:  The patient appears stated age and is in NAD. HEENT:  Normocephalic, atraumatic.  The mucous membranes are moist. The superficial temporal arteries are without ropiness or tenderness. CV:  RRR Lungs:  CTAB Neck/HEME:  There are no carotid bruits bilaterally.  The neck is flexed.  The chin is on the chest.  Neurological examination:  Orientation:  Montreal Cognitive Assessment  11/10/2017  Visuospatial/ Executive (0/5) 0  Naming (0/3) 3  Attention: Read list of digits (0/2) 2  Attention: Read list of letters (0/1) 1  Attention: Serial 7 subtraction starting at 100 (0/3) 2  Language: Repeat phrase (0/2) 2  Language : Fluency (0/1) 1  Abstraction (0/2) 1  Delayed Recall (0/5) 3  Orientation (0/6) 6  Total 21  Adjusted Score (based on education) 21   Cranial nerves: There is good facial symmetry. There is facial hypomimia. The visual fields are full to confrontational testing. The speech is fluent and clear but hypophonic. Soft palate rises  symmetrically and there is no tongue deviation. Hearing is intact to conversational tone. Sensation: Sensation is intact to light touch throughout Motor: Strength is 5/5 in the bilateral upper and lower extremities.   Shoulder shrug is equal and symmetric.  There is no pronator drift.   Movement examination: Tone: There is normal tone in the bilateral upper extremities.  The tone in the lower extremities is  normal.  Abnormal movements:   there is mild left upper extremity resting tremor. Coordination:  There is some difficulty with hand opening and closing bilaterally and finger taps more on the L. Gait and Station: The patient requires moderate assist out of the chair.  She drags the left leg (she attributes this to history of polio).  She is unstable.  She shuffles.  When given a walker, she walks markedly better with a longer stride length.  LABS  Lab Results  Component Value Date   TSH 7.416 (H) 09/11/2017     Chemistry      Component Value Date/Time   NA 136 09/14/2017 0454   K 4.2 09/14/2017 0454   CL 106 09/14/2017 0454   CO2 25 09/14/2017 0454   BUN 6 09/14/2017 0454   CREATININE 0.65 09/14/2017 0454      Component Value Date/Time   CALCIUM 7.9 (L) 09/14/2017 0454   ALKPHOS 111 09/11/2017 1738   AST 53 (H) 09/11/2017 1738   ALT 54 09/11/2017 1738   BILITOT 1.4 (H) 09/11/2017 1738     Lab Results  Component Value Date   WBC 4.8 09/14/2017   HGB 10.0 (L) 09/14/2017   HCT 30.0 (L) 09/14/2017   MCV 82.6 09/14/2017   PLT 208 09/14/2017   Lab Results  Component Value Date   IRON 61 09/18/2016   TIBC 281 09/18/2016   FERRITIN 98 09/18/2016     Chemistry      Component Value Date/Time   NA 136 09/14/2017 0454   K 4.2 09/14/2017 0454   CL 106 09/14/2017 0454   CO2 25 09/14/2017 0454   BUN 6 09/14/2017 0454   CREATININE 0.65 09/14/2017 0454      Component Value Date/Time   CALCIUM 7.9 (L) 09/14/2017 0454   ALKPHOS 111 09/11/2017 1738   AST 53 (  H)  09/11/2017 1738   ALT 54 09/11/2017 1738   BILITOT 1.4 (H) 09/11/2017 1738       ASSESSMENT/PLAN:  1.  Parkinsons disease  -She has had multiple falls since our last visit.  This is primarily due to noncompliance, both with follow-up visits as well as medication.  She had stopped all levodopa.  She and I talked about the importance of compliance.  We talked about the risks of falls and the morbidity and mortality associated with these.  She had trouble swallowing immediate release levodopa, so we had previously switched her to Rytary, which she did not take properly.  She does state that she will get back on it.  I really need to see her when she is on 145 mg 3 times per day.  This may not be enough dosage, but I will not be able to tell until she is back on it.  I do not think she is safe to live alone.  I discussed this in detail with her today.  She disagrees and refuses to even consider assisted living.  Home health reported that she refused help with medication and even refused to allow someone to help her with her pillbox.  I have my Parkinson's social worker get involved with her today.  The patient is interested in having help with transportation, which I am sure we can help with, but am more concerned about her long-term living situation.  -RX for lift chair 2.  Restless leg syndrome  -Decrease Mirapex 0.25 mg to once per day at night.  She is currently taking it twice daily (she states most days once per day but occasionally twice per day).  Talked to her about cognitive changes associated with pramipexole.  -anemia may be contributing to this as well 3.  Follow up is anticipated in the next few months, sooner should new neurologic issues arise.  Much greater than 50% of this visit was spent in counseling and coordinating care.  Total face to face time:  30 min.  This did not include the 40 min of record review which was detailed above, which was non face to face time.

## 2017-11-10 ENCOUNTER — Encounter: Payer: Self-pay | Admitting: Neurology

## 2017-11-10 ENCOUNTER — Ambulatory Visit: Payer: Medicare Other | Admitting: Neurology

## 2017-11-10 VITALS — BP 128/78 | HR 78 | Ht <= 58 in | Wt 88.0 lb

## 2017-11-10 DIAGNOSIS — D649 Anemia, unspecified: Secondary | ICD-10-CM | POA: Diagnosis not present

## 2017-11-10 DIAGNOSIS — G20 Parkinson's disease: Secondary | ICD-10-CM

## 2017-11-10 DIAGNOSIS — G2581 Restless legs syndrome: Secondary | ICD-10-CM | POA: Diagnosis not present

## 2017-11-10 MED ORDER — PRAMIPEXOLE DIHYDROCHLORIDE 0.125 MG PO TABS: 0.1250 mg | ORAL_TABLET | Freq: Every day | ORAL | 3 refills | Status: DC

## 2017-11-10 MED ORDER — CARBIDOPA-LEVODOPA ER 36.25-145 MG PO CPCR: 1.0000 | ORAL_CAPSULE | Freq: Three times a day (TID) | ORAL | 3 refills | Status: DC

## 2017-11-10 MED ORDER — AMBULATORY NON FORMULARY MEDICATION: 0 refills | Status: DC

## 2017-11-10 NOTE — Patient Instructions (Signed)
1. Restart Rytary 145 mg - one tablet three times daily.   2. Continue Pramipexole. Only take one tablet at bedtime.   3. Follow up in 3-4 months.

## 2017-11-11 ENCOUNTER — Encounter: Payer: Self-pay | Admitting: Psychology

## 2017-11-11 NOTE — Progress Notes (Signed)
Met patient in the clinic yesterday while she was here in the clinic.  Her nephew was present as well.  She had self identified that transportation was an issue for her.  Today I followed up with her on some transportation resources.  [employer] would be a good fit as they provide medical transportation to appointments.  I provided her with the contact information for the program coordinator.  In addition, I placed this information in the mail.  Yesterday she was supposed to update her information that would allow me to coordinate some of her needs with her nephew, that she did not do this at check out.  I put a form in the mail for her to update this information with a return envelope.   I contacted the patient and the plan is that she is going to contact [employer] and speak with the program coordinator.  If she has problems I want her to call me back.  In addition, I want her to complete the form in the mail and return it as soon as possible so I can include her nephew who helps her a lot in her care.

## 2017-11-12 ENCOUNTER — Telehealth: Payer: Self-pay | Admitting: Neurology

## 2017-11-13 NOTE — Telephone Encounter (Signed)
error 

## 2017-11-16 DIAGNOSIS — D181 Lymphangioma, any site: Secondary | ICD-10-CM | POA: Diagnosis not present

## 2017-11-16 DIAGNOSIS — M1991 Primary osteoarthritis, unspecified site: Secondary | ICD-10-CM | POA: Diagnosis not present

## 2017-11-16 DIAGNOSIS — N390 Urinary tract infection, site not specified: Secondary | ICD-10-CM | POA: Diagnosis not present

## 2017-11-16 DIAGNOSIS — G20 Parkinson's disease: Secondary | ICD-10-CM | POA: Diagnosis not present

## 2017-11-16 DIAGNOSIS — M542 Cervicalgia: Secondary | ICD-10-CM | POA: Diagnosis not present

## 2017-11-16 DIAGNOSIS — Z9181 History of falling: Secondary | ICD-10-CM | POA: Diagnosis not present

## 2017-11-16 DIAGNOSIS — Z8673 Personal history of transient ischemic attack (TIA), and cerebral infarction without residual deficits: Secondary | ICD-10-CM | POA: Diagnosis not present

## 2017-11-16 DIAGNOSIS — G2581 Restless legs syndrome: Secondary | ICD-10-CM | POA: Diagnosis not present

## 2017-11-16 DIAGNOSIS — D510 Vitamin B12 deficiency anemia due to intrinsic factor deficiency: Secondary | ICD-10-CM | POA: Diagnosis not present

## 2017-11-19 DIAGNOSIS — M1991 Primary osteoarthritis, unspecified site: Secondary | ICD-10-CM | POA: Diagnosis not present

## 2017-11-19 DIAGNOSIS — G20 Parkinson's disease: Secondary | ICD-10-CM | POA: Diagnosis not present

## 2017-11-19 DIAGNOSIS — Z8673 Personal history of transient ischemic attack (TIA), and cerebral infarction without residual deficits: Secondary | ICD-10-CM | POA: Diagnosis not present

## 2017-11-19 DIAGNOSIS — M542 Cervicalgia: Secondary | ICD-10-CM | POA: Diagnosis not present

## 2017-11-19 DIAGNOSIS — Z9181 History of falling: Secondary | ICD-10-CM | POA: Diagnosis not present

## 2017-11-19 DIAGNOSIS — D181 Lymphangioma, any site: Secondary | ICD-10-CM | POA: Diagnosis not present

## 2017-11-19 DIAGNOSIS — G2581 Restless legs syndrome: Secondary | ICD-10-CM | POA: Diagnosis not present

## 2017-11-19 DIAGNOSIS — N390 Urinary tract infection, site not specified: Secondary | ICD-10-CM | POA: Diagnosis not present

## 2017-11-19 DIAGNOSIS — D510 Vitamin B12 deficiency anemia due to intrinsic factor deficiency: Secondary | ICD-10-CM | POA: Diagnosis not present

## 2017-11-23 ENCOUNTER — Ambulatory Visit: Payer: Self-pay

## 2017-11-23 ENCOUNTER — Telehealth: Payer: Self-pay | Admitting: Internal Medicine

## 2017-11-23 DIAGNOSIS — Z8673 Personal history of transient ischemic attack (TIA), and cerebral infarction without residual deficits: Secondary | ICD-10-CM | POA: Diagnosis not present

## 2017-11-23 DIAGNOSIS — G20 Parkinson's disease: Secondary | ICD-10-CM | POA: Diagnosis not present

## 2017-11-23 DIAGNOSIS — G2581 Restless legs syndrome: Secondary | ICD-10-CM | POA: Diagnosis not present

## 2017-11-23 DIAGNOSIS — D181 Lymphangioma, any site: Secondary | ICD-10-CM | POA: Diagnosis not present

## 2017-11-23 DIAGNOSIS — M1991 Primary osteoarthritis, unspecified site: Secondary | ICD-10-CM | POA: Diagnosis not present

## 2017-11-23 DIAGNOSIS — N390 Urinary tract infection, site not specified: Secondary | ICD-10-CM | POA: Diagnosis not present

## 2017-11-23 DIAGNOSIS — D510 Vitamin B12 deficiency anemia due to intrinsic factor deficiency: Secondary | ICD-10-CM | POA: Diagnosis not present

## 2017-11-23 DIAGNOSIS — Z9181 History of falling: Secondary | ICD-10-CM | POA: Diagnosis not present

## 2017-11-23 DIAGNOSIS — M542 Cervicalgia: Secondary | ICD-10-CM | POA: Diagnosis not present

## 2017-11-23 NOTE — Telephone Encounter (Signed)
Copied from [place] [phone]. Topic: Quick Communication - See Telephone Encounter >> Nov 23, 2017  4:54 PM Cleaster Corin, NT wrote: CRM for notification. See Telephone encounter for:   11/23/17. Flor called from [facility] needs a Verbal order needed to add Visit for home health therapy (2 visit) flor can be reached at [phone]

## 2017-11-23 NOTE — Telephone Encounter (Signed)
Phone call from pts. Physical Therapist to report swelling of bilateral LE's from feet to just below knees; reported 2+ pitting edema per PT.  Stated the pt. reported the swelling has increased over the past two weeks.  Reported the leg swelling is equal bilaterally.  Stated the legs feels equally warm, and the left shin has "light red, shiny appearance".  Denied shortness of breath or chest pain.  Denied fever/chills.  Denied any breakdown in skin integrity; no drainage. Per protocol, pt. advised to schedule appt. on 1/2, since the office is closed on 1/1.  Care Advice per protocol. Verb. Understanding.  Agreed with plan.     Reason for Disposition . [1] MODERATE leg swelling (e.g., swelling extends up to knees) AND [2] new onset or worsening  Answer Assessment - Initial Assessment Questions 1. ONSET: "When did the swelling start?" (e.g., minutes, hours, days)     About 2 weeks ago 2. LOCATION: "What part of the leg is swollen?"  "Are both legs swollen or just one leg?"     Bilateral lower extremities, from foot to just below knee 3. SEVERITY: "How bad is the swelling?" (e.g., localized; mild, moderate, severe)  - Localized - small area of swelling localized to one leg  - MILD pedal edema - swelling limited to foot and ankle, pitting edema < 1/4 inch (6 mm) deep, rest and elevation eliminate most or all swelling  - MODERATE edema - swelling of lower leg to knee, pitting edema > 1/4 inch (6 mm) deep, rest and elevation only partially reduce swelling  - SEVERE edema - swelling extends above knee, facial or hand swelling present      2 + pitting and c/o tender.  4. REDNESS: "Does the swelling look red or infected?"     Warm to touch, shiny; with light red area on left shin; denies any skin breakdown or drainage.   5. PAIN: "Is the swelling painful to touch?" If so, ask: "How painful is it?"   (Scale 1-10; mild, moderate or severe)     Tender with pressing on the leg  6. FEVER: "Do you have a  fever?" If so, ask: "What is it, how was it measured, and when did it start?"     no 7. CAUSE: "What do you think is causing the leg swelling?"     Unknown  8. MEDICAL HISTORY: "Do you have a history of heart failure, kidney disease, liver failure, or cancer?"     Parkinson's disease ; no CHF, no liver disease, no hx cancer 9. RECURRENT SYMPTOM: "Have you had leg swelling before?" If so, ask: "When was the last time?" "What happened that time?"     Hasn't had this prior to 2 weeks ago 10. OTHER SYMPTOMS: "Do you have any other symptoms?" (e.g., chest pain, difficulty breathing)      Denies chest pain , denies shortness of breath, denies cough.  11. PREGNANCY: "Is there any chance you are pregnant?" "When was your last menstrual period?"       n/a  Protocols used: LEG SWELLING AND EDEMA-A-AH

## 2017-11-25 ENCOUNTER — Emergency Department (HOSPITAL_COMMUNITY)
Admission: EM | Admit: 2017-11-25 | Discharge: 2017-11-25 | Disposition: A | Discharge status: Home / Self Care | Payer: Medicare Other | Attending: Emergency Medicine | Admitting: Emergency Medicine

## 2017-11-25 ENCOUNTER — Encounter: Payer: Self-pay | Admitting: Family

## 2017-11-25 ENCOUNTER — Ambulatory Visit: Payer: Medicare Other | Admitting: Family

## 2017-11-25 ENCOUNTER — Encounter (HOSPITAL_COMMUNITY): Payer: Self-pay | Admitting: Family Medicine

## 2017-11-25 ENCOUNTER — Encounter (HOSPITAL_COMMUNITY): Payer: Medicare Other

## 2017-11-25 VITALS — HR 78 | Temp 97.6°F | Wt 90.0 lb

## 2017-11-25 DIAGNOSIS — R2243 Localized swelling, mass and lump, lower limb, bilateral: Secondary | ICD-10-CM | POA: Diagnosis not present

## 2017-11-25 DIAGNOSIS — Z5321 Procedure and treatment not carried out due to patient leaving prior to being seen by health care provider: Secondary | ICD-10-CM | POA: Diagnosis not present

## 2017-11-25 DIAGNOSIS — R600 Localized edema: Secondary | ICD-10-CM

## 2017-11-25 DIAGNOSIS — R7981 Abnormal blood-gas level: Secondary | ICD-10-CM

## 2017-11-25 NOTE — Telephone Encounter (Signed)
Spoke with Flor to give verbal orders per MD.  

## 2017-11-25 NOTE — ED Triage Notes (Signed)
Went to call patient for a room to perform ultrasound, she didn't answer with multiple staff members calling in the lobby and I didn't visualize her in the lobby. Attempted to call her cell phone and left a message. Also, spoke with a family member and she was under the assumption that patient was in the ED. The order has been canceled and the ultrasound tech is aware to not come do the scan.

## 2017-11-25 NOTE — ED Triage Notes (Signed)
Patient is being referred from Jodi Mourning, [place] for possible DVT. Patient has been receiving physical therapy at home. Physical therapist and patient noticed she had increased swelling and redness to lower extremities. Denies chest pain, shortness of breath, and fever.

## 2017-11-25 NOTE — ED Notes (Signed)
Elizabeth [name], MUS has called ultrasound. Ultrasound is at [facility] and will call when the tech is leaving to come to [facility] for scans. Patient will wait in the lobby till she either gets a room or tech calls to report they are enroute. Will move patient to TCU for scan.

## 2017-11-25 NOTE — ED Notes (Signed)
Coming from PCP-states B/L LE edema-states sending for possible DVT/PE-states patient is going to go have lunch and then she will come in to be evaluated

## 2017-11-25 NOTE — Patient Instructions (Signed)
Please go to [facility] as we discussed; I will let them know that you are coming.

## 2017-11-26 ENCOUNTER — Telehealth: Payer: Self-pay | Admitting: Internal Medicine

## 2017-11-26 DIAGNOSIS — G20 Parkinson's disease: Secondary | ICD-10-CM | POA: Diagnosis not present

## 2017-11-26 DIAGNOSIS — N390 Urinary tract infection, site not specified: Secondary | ICD-10-CM | POA: Diagnosis not present

## 2017-11-26 DIAGNOSIS — M1991 Primary osteoarthritis, unspecified site: Secondary | ICD-10-CM | POA: Diagnosis not present

## 2017-11-26 DIAGNOSIS — R600 Localized edema: Secondary | ICD-10-CM

## 2017-11-26 DIAGNOSIS — M542 Cervicalgia: Secondary | ICD-10-CM | POA: Diagnosis not present

## 2017-11-26 DIAGNOSIS — G2581 Restless legs syndrome: Secondary | ICD-10-CM | POA: Diagnosis not present

## 2017-11-26 DIAGNOSIS — Z9181 History of falling: Secondary | ICD-10-CM | POA: Diagnosis not present

## 2017-11-26 DIAGNOSIS — D510 Vitamin B12 deficiency anemia due to intrinsic factor deficiency: Secondary | ICD-10-CM | POA: Diagnosis not present

## 2017-11-26 DIAGNOSIS — D181 Lymphangioma, any site: Secondary | ICD-10-CM | POA: Diagnosis not present

## 2017-11-26 DIAGNOSIS — Z8673 Personal history of transient ischemic attack (TIA), and cerebral infarction without residual deficits: Secondary | ICD-10-CM | POA: Diagnosis not present

## 2017-11-26 NOTE — Progress Notes (Signed)
Elizabeth [name] is a 84 y.o. female with the following history as recorded in EpicCare:  Patient Active Problem List   Diagnosis Date Noted  . Klebsiella cystitis 09/19/2017  . Overactive bladder 09/19/2017  . Pernicious anemia   . Post-polio syndrome   . Vitamin B12 deficiency   . Fall 09/11/2017  . Subdural hygroma 09/11/2017  . Weakness 06/24/2017  . UTI (urinary tract infection) 06/24/2017  . Swallowing disorder 03/04/2017  . Parkinson disease ([place]) 06/25/2016  . Restless leg syndrome 12/25/2015  . Arthritis 12/25/2015  . Abnormality of gait 05/24/2015  . Intention tremor 12/08/2013  . History of CVA (cerebrovascular accident) 07/20/2012  . IRRITABLE BOWEL SYNDROME 01/17/2009  . Cervicalgia 01/17/2009  . Irritable bowel syndrome (IBS) 01/17/2009  . Urinary incontinence 09/07/2008  . ANEMIA, B12 DEFICIENCY 04/26/2008  . HYPERLIPIDEMIA 01/26/2008  . History of TIA (transient ischemic attack) 01/26/2008  . Hypothyroidism 01/04/2008  . Osteopenia 01/04/2008  . Abnormal Pap smear of vagina 09/24/2006    Current Outpatient Medications  Medication Sig Dispense Refill  . AMBULATORY NON FORMULARY MEDICATION Lift Chair DX: G20 1 Device 0  . aspirin 81 MG tablet Take 81 mg by mouth daily.      . Calcium Carbonate-Vitamin D (CALCIUM + D PO) Take 1 tablet by mouth daily. Reported on 12/25/2015    . Carbidopa-Levodopa ER (RYTARY) 36.25-145 MG CPCR Take 1 tablet by mouth 3 (three) times daily. 90 capsule 3  . cholecalciferol (VITAMIN D) 1000 UNITS tablet Take 1,000 Units by mouth daily.      Marland Kitchen levothyroxine (SYNTHROID, LEVOTHROID) 75 MCG tablet Take 0.5 tablets (37.5 mcg total) by mouth daily before breakfast.    . pramipexole (MIRAPEX) 0.125 MG tablet Take 1 tablet (0.125 mg total) by mouth at bedtime. 30 tablet 3  . Simethicone (GAS-X EXTRA STRENGTH) 125 MG CAPS Take 125 mg by mouth daily as needed (gas).    . tolterodine (DETROL LA) 4 MG 24 hr capsule Take 4 mg by mouth daily.     .  vitamin B-12 (CYANOCOBALAMIN) 1000 MCG tablet Take 1 tablet (1,000 mcg total) by mouth daily. 90 tablet 3   No current facility-administered medications for this visit.     Allergies: Atorvastatin; Crestor [rosuvastatin calcium]; and Vesicare [solifenacin]  Past Medical History:  Diagnosis Date  . Abnormal Pap smear of vagina 09/2006   Dr Nori Riis  . Abnormality of gait 05/24/2015   PMH R occipital infarct B12 deficiency H/o polio affected left leg  In Physical Therapy to improve balance & strength through Noel Gerold  . Arthritis 12/25/2015  . Cervicalgia 01/17/2009   MRI 03/27/16:  IMPRESSION: 1. At C3-4 there is a mild broad-based disc bulge. Moderate bilateral facet arthropathy and uncovertebral degenerative changes resulting in moderate bilateral foraminal stenosis. 2. At C5-6 there is a broad-based disc osteophyte complex. Bilateral uncovertebral degenerative changes and facet arthropathy resulting in moderate bilateral foraminal stenosis.  . Fall 09/11/2017  . History of CVA (cerebrovascular accident) 07/20/2012   Chronic R occipital infarct ; stable on CT 05/21/2012 .Dr Jannifer Franklin , Neurology   . History of TIA (transient ischemic attack) 01/26/2008  . HYPERLIPIDEMIA 01/26/2008   NMR Lipoprofile 2009: LDL 135 (1704/911), HDL 62, TG 131. LDL goal = < 100; ideally < 70. MGF MI in 64s No FH CVA  . Hypothyroidism   . Intention tremor 12/08/2013   Onset after fall 04/2012   . Irritable bowel syndrome (IBS) 01/17/2009   Dr. Unice Cobble  . Osteopenia  PMH fracture heel, wrist  . Parkinson disease ([place]) 06/25/2016   Dr Tat  . Pernicious anemia   . Post-polio syndrome    minor problems L leg  . Restless leg syndrome 12/25/2015   Dr Tat  . Subdural hygroma 09/11/2017  . Swallowing disorder 03/04/2017  . Urinary incontinence 09/07/2008   Dr McDiarmid, Urology   . Vitamin B12 deficiency   . Weakness 06/24/2017    Past Surgical History:  Procedure Laterality Date  . BLADDER SUSPENSION  2000  .  CATARACT EXTRACTION, BILATERAL     Dr. Gershon Crane  . no colonoscopy     "I never felt I needed one "  . YAG LASER APPLICATION Left    Dr. Gershon Crane    Family History  Problem Relation Age of Onset  . Heart attack Maternal Grandfather        70s  . Emphysema Mother   . Hypertension Mother   . Alzheimer's disease Father   . Breast cancer Maternal Grandmother   . Cancer Maternal Grandmother        breast  . Diabetes Neg Hx   . Stroke Neg Hx   . Parkinson's disease Neg Hx     Social History   Tobacco Use  . Smoking status: Never Smoker  . Smokeless tobacco: Never Used  Substance Use Topics  . Alcohol use: Yes    Alcohol/week: 0.6 oz    Types: 1 Glasses of wine per week    Comment: 0.6 oz per week    Subjective:  Patient is seen with her son today; complaining of severe painful swelling in both lower legs x 1-2 weeks; no prior history of CHF noted in chart; has recently increased amount of Carbidopa-Levodopa she has been taking;  swelling is worsening and redness has developed in the past few days; notes that she cannot get her shoes on her feet; swelling is present from ankle to below knee; denies any shortness of breath but admits she has had some pain in her left ribs-" I think I broke a rib coughing."  Objective:  Vitals:   11/25/17 1347  Pulse: 78  Temp: 97.6 F (36.4 C)  SpO2: 90%  Weight: 90 lb (40.8 kg)    General: Well developed, well nourished, in no acute distress  Skin : Warm and dry.  Head: Normocephalic and atraumatic  Eyes: Sclera and conjunctiva clear; pupils round and reactive to light; extraocular movements intact  Ears: External normal; canals clear; tympanic membranes normal  Oropharynx: Pink, supple. No suspicious lesions  Neck: Supple without thyromegaly, adenopathy  Lungs: Respirations unlabored; clear to auscultation bilaterally without wheeze, rales, rhonchi  CVS exam: normal rate and regular rhythm.  Extremities: 2+ pitting edema bilaterally with  erythema,  cyanosis, clubbing  Vessels: Unable to assess due to swelling Neurologic: Alert and oriented; speech intact; face symmetrical; moves all extremities well; CNII-XII intact without focal deficit  Assessment:  1. Pedal edema   2. Low O2 saturation     Plan:  Based on physical exam and patient's appearance, recommend ER evaluation; son and patient express understanding;  follow-up to be determined. Nurse at ER notified;   No Follow-up on file.  No orders of the defined types were placed in this encounter.   Requested Prescriptions    No prescriptions requested or ordered in this encounter

## 2017-11-26 NOTE — Telephone Encounter (Signed)
Copied from [place] [id number]. Topic: General - Other >> Nov 26, 2017 11:12 AM Scherrie Gerlach wrote: Reason for CRM: Costella Hatcher with [facility] OT wants the dr to know that after Jodi Mourning sw pt yesterday and sending pt to the ED , pt left without being seen due to so many people and the wait time. Pt never got the VAS [place] LOWER EXTREMITY VENOUS (DVT) He states pt legs are still very shiny today, he thought she had vaseline on them. Wanted to make dr aware so could get her in again.  Pt will keep her appt on Wednesday with Dr Quay Burow. Please advise

## 2017-11-26 NOTE — Telephone Encounter (Signed)
She should have a [place] of her legs if there is a concern about a blood clot - I will order urgently - should be done tomorrow and she should f/u with me next week or sooner with someone else if she has other concerning symptoms ( chest pain, shortness of breath, fever)

## 2017-11-26 NOTE — Telephone Encounter (Signed)
LVM informing pt, tried contacting Costella Hatcher with [facility] and unable to LVM due to mailbox being full.

## 2017-11-27 ENCOUNTER — Other Ambulatory Visit: Payer: Self-pay

## 2017-11-27 ENCOUNTER — Emergency Department (HOSPITAL_BASED_OUTPATIENT_CLINIC_OR_DEPARTMENT_OTHER): Admit: 2017-11-27 | Discharge: 2017-11-27 | Disposition: A | Discharge status: Home / Self Care | Payer: Medicare Other

## 2017-11-27 ENCOUNTER — Emergency Department (HOSPITAL_COMMUNITY): Payer: Medicare Other

## 2017-11-27 ENCOUNTER — Emergency Department (HOSPITAL_COMMUNITY)
Admission: EM | Admit: 2017-11-27 | Discharge: 2017-11-27 | Disposition: A | Discharge status: Home / Self Care | Payer: Medicare Other | Attending: Emergency Medicine | Admitting: Emergency Medicine

## 2017-11-27 ENCOUNTER — Encounter (HOSPITAL_COMMUNITY): Payer: Self-pay

## 2017-11-27 DIAGNOSIS — R600 Localized edema: Secondary | ICD-10-CM | POA: Diagnosis not present

## 2017-11-27 DIAGNOSIS — R300 Dysuria: Secondary | ICD-10-CM | POA: Diagnosis not present

## 2017-11-27 DIAGNOSIS — Z79899 Other long term (current) drug therapy: Secondary | ICD-10-CM | POA: Insufficient documentation

## 2017-11-27 DIAGNOSIS — N390 Urinary tract infection, site not specified: Secondary | ICD-10-CM | POA: Diagnosis not present

## 2017-11-27 DIAGNOSIS — R0789 Other chest pain: Secondary | ICD-10-CM | POA: Diagnosis not present

## 2017-11-27 DIAGNOSIS — E039 Hypothyroidism, unspecified: Secondary | ICD-10-CM | POA: Diagnosis not present

## 2017-11-27 DIAGNOSIS — R2243 Localized swelling, mass and lump, lower limb, bilateral: Secondary | ICD-10-CM | POA: Diagnosis present

## 2017-11-27 DIAGNOSIS — Z7982 Long term (current) use of aspirin: Secondary | ICD-10-CM | POA: Diagnosis not present

## 2017-11-27 LAB — COMPREHENSIVE METABOLIC PANEL
ALT: 25 U/L (ref 14–54)
AST: 37 U/L (ref 15–41)
Albumin: 3.7 g/dL (ref 3.5–5.0)
Alkaline Phosphatase: 94 U/L (ref 38–126)
Anion gap: 8 (ref 5–15)
BUN: 16 mg/dL (ref 6–20)
CHLORIDE: 107 mmol/L (ref 101–111)
CO2: 25 mmol/L (ref 22–32)
Calcium: 8.8 mg/dL — ABNORMAL LOW (ref 8.9–10.3)
Creatinine, Ser: 0.74 mg/dL (ref 0.44–1.00)
GFR calc Af Amer: >60 mL/min (ref >60)
Glucose, Bld: 94 mg/dL (ref 65–99)
POTASSIUM: 3.5 mmol/L (ref 3.5–5.1)
Sodium: 140 mmol/L (ref 135–145)
Total Bilirubin: 0.8 mg/dL (ref 0.3–1.2)
Total Protein: 6.6 g/dL (ref 6.5–8.1)

## 2017-11-27 LAB — URINALYSIS, ROUTINE W REFLEX MICROSCOPIC
Bilirubin Urine: NEGATIVE
GLUCOSE, UA: NEGATIVE mg/dL
KETONES UR: NEGATIVE mg/dL
Nitrite: POSITIVE — AB
PROTEIN: NEGATIVE mg/dL
Specific Gravity, Urine: 1.008 (ref 1.005–1.030)
pH: 6 (ref 5.0–8.0)

## 2017-11-27 LAB — I-STAT TROPONIN, ED: Troponin i, poc: 0 ng/mL (ref 0.00–0.08)

## 2017-11-27 LAB — CBC WITH DIFFERENTIAL/PLATELET
BASOS ABS: 0 10*3/uL (ref 0.0–0.1)
Basophils Relative: 0 %
EOS PCT: 3 %
Eosinophils Absolute: 0.1 10*3/uL (ref 0.0–0.7)
HCT: 32.8 % — ABNORMAL LOW (ref 36.0–46.0)
Hemoglobin: 10.8 g/dL — ABNORMAL LOW (ref 12.0–15.0)
Lymphocytes Relative: 23 %
Lymphs Abs: 1.1 10*3/uL (ref 0.7–4.0)
MCH: 27.8 pg (ref 26.0–34.0)
MCHC: 32.9 g/dL (ref 30.0–36.0)
MCV: 84.5 fL (ref 78.0–100.0)
MONO ABS: 0.5 10*3/uL (ref 0.1–1.0)
Monocytes Relative: 9 %
Neutro Abs: 3.1 10*3/uL (ref 1.7–7.7)
Neutrophils Relative %: 65 %
Platelets: 193 10*3/uL (ref 150–400)
RBC: 3.88 MIL/uL (ref 3.87–5.11)
RDW: 16.5 % — AB (ref 11.5–15.5)
WBC: 4.8 10*3/uL (ref 4.0–10.5)

## 2017-11-27 LAB — BRAIN NATRIURETIC PEPTIDE: B NATRIURETIC PEPTIDE 5: 253.9 pg/mL — AB (ref 0.0–100.0)

## 2017-11-27 LAB — LIPASE, BLOOD: LIPASE: 31 U/L (ref 11–51)

## 2017-11-27 MED ORDER — FUROSEMIDE 20 MG PO TABS: 20.0000 mg | ORAL_TABLET | Freq: Every day | ORAL | 0 refills | Status: DC

## 2017-11-27 MED ORDER — FUROSEMIDE 10 MG/ML IJ SOLN
20.0000 mg | Freq: Once | INTRAMUSCULAR | Status: AC
  Administered 2017-11-27: 20 mg via INTRAVENOUS
  Filled 2017-11-27: qty 4

## 2017-11-27 MED ORDER — CEPHALEXIN 500 MG PO CAPS: 500.0000 mg | ORAL_CAPSULE | Freq: Two times a day (BID) | ORAL | 0 refills | Status: DC

## 2017-11-27 MED ORDER — CEPHALEXIN 500 MG PO CAPS
500.0000 mg | ORAL_CAPSULE | Freq: Once | ORAL | Status: AC
  Administered 2017-11-27: 500 mg via ORAL
  Filled 2017-11-27: qty 1

## 2017-11-27 NOTE — Care Management Note (Signed)
Case Management Note  Patient Details  Name: Nil Bolser MRN: [id number] Date of Birth: August 06, 1931  CM noted pt was active with [facility].  Contacted Tim who confirmed PT OT HHS and will follow for needs.  Expected Discharge Date:   Unkown               Expected Discharge Plan:  [place]  Post Acute Care Choice:  Home Health Choice offered to:  Patient  HH Arranged:  PT, OT [place] Agency:  [facility] (formerly [facility]  Status of Service:  In process, will continue to follow  Rae Mar, RN 11/27/2017, 12:32 PM

## 2017-11-27 NOTE — ED Provider Notes (Signed)
[place] DEPT Provider Note   CSN: [id number] Arrival date & time: 11/27/17  0820     History   Chief Complaint Chief Complaint  Patient presents with  . Leg Swelling  . Dysuria    HPI Elizabeth [name] is a 84 y.o. female.  HPI Elizabeth [name] is a 84 y.o. female presents to emergency department with multiple complaints.  Patient states that she is mainly here with swelling to bilateral legs.  She states she noticed swelling approximately 2 weeks ago.  She states this is.  She denies any history of leg swelling in the past.  She states that her legs are painful.  She states that they have actually feel better when she gets up and walks, they are worse at rest.  She denies any fever or chills.  She denies any injuries.  She denies any history of heart failure.  Patient is also complaining of pain to the left ribs.  She states she did fall couple weeks ago but was seen in emergency department at that time and has not had any more injury since then.  She does not think she injured her ribs during the fall.  She states pain is sharp, left side of the chest, comes and goes.  Denies any shortness of breath.  Patient is also complaining of pain to the right scapula area when she is eating.  She states "it feels like food just gets stuck and it is painful."  She denies however any associated abdominal pain or nausea or she is able to swallow her food.  Patient is also reports dysuria, urinary frequency and urgency.  Denies any fever or chills.  No flank pain.  States "I think I have a UTI."  Past Medical History:  Diagnosis Date  . Abnormal Pap smear of vagina 09/2006   Dr Nori Riis  . Abnormality of gait 05/24/2015   PMH R occipital infarct B12 deficiency H/o polio affected left leg  In Physical Therapy to improve balance & strength through Noel Gerold  . Arthritis 12/25/2015  . Cervicalgia 01/17/2009   MRI 03/27/16:  IMPRESSION: 1. At C3-4 there is a mild broad-based disc  bulge. Moderate bilateral facet arthropathy and uncovertebral degenerative changes resulting in moderate bilateral foraminal stenosis. 2. At C5-6 there is a broad-based disc osteophyte complex. Bilateral uncovertebral degenerative changes and facet arthropathy resulting in moderate bilateral foraminal stenosis.  . Fall 09/11/2017  . History of CVA (cerebrovascular accident) 07/20/2012   Chronic R occipital infarct ; stable on CT 05/21/2012 .Dr Jannifer Franklin , Neurology   . History of TIA (transient ischemic attack) 01/26/2008  . HYPERLIPIDEMIA 01/26/2008   NMR Lipoprofile 2009: LDL 135 (1704/911), HDL 62, TG 131. LDL goal = < 100; ideally < 70. MGF MI in 29s No FH CVA  . Hypothyroidism   . Intention tremor 12/08/2013   Onset after fall 04/2012   . Irritable bowel syndrome (IBS) 01/17/2009   Dr. Unice Cobble  . Osteopenia    PMH fracture heel, wrist  . Parkinson disease ([place]) 06/25/2016   Dr Tat  . Pernicious anemia   . Post-polio syndrome    minor problems L leg  . Restless leg syndrome 12/25/2015   Dr Tat  . Subdural hygroma 09/11/2017  . Swallowing disorder 03/04/2017  . Urinary incontinence 09/07/2008   Dr McDiarmid, Urology   . Vitamin B12 deficiency   . Weakness 06/24/2017    Patient Active Problem List   Diagnosis Date Noted  .  Klebsiella cystitis 09/19/2017  . Overactive bladder 09/19/2017  . Pernicious anemia   . Post-polio syndrome   . Vitamin B12 deficiency   . Fall 09/11/2017  . Subdural hygroma 09/11/2017  . Weakness 06/24/2017  . UTI (urinary tract infection) 06/24/2017  . Swallowing disorder 03/04/2017  . Parkinson disease ([place]) 06/25/2016  . Restless leg syndrome 12/25/2015  . Arthritis 12/25/2015  . Abnormality of gait 05/24/2015  . Intention tremor 12/08/2013  . History of CVA (cerebrovascular accident) 07/20/2012  . IRRITABLE BOWEL SYNDROME 01/17/2009  . Cervicalgia 01/17/2009  . Irritable bowel syndrome (IBS) 01/17/2009  . Urinary incontinence 09/07/2008  . ANEMIA,  B12 DEFICIENCY 04/26/2008  . HYPERLIPIDEMIA 01/26/2008  . History of TIA (transient ischemic attack) 01/26/2008  . Hypothyroidism 01/04/2008  . Osteopenia 01/04/2008  . Abnormal Pap smear of vagina 09/24/2006    Past Surgical History:  Procedure Laterality Date  . BLADDER SUSPENSION  2000  . CATARACT EXTRACTION, BILATERAL     Dr. Gershon Crane  . no colonoscopy     "I never felt I needed one "  . YAG LASER APPLICATION Left    Dr. Gershon Crane    OB History    No data available       Home Medications    Prior to Admission medications   Medication Sig Start Date End Date Taking? Authorizing Provider  AMBULATORY NON FORMULARY MEDICATION Lift Chair DX: G20 11/10/17   TatEustace Quail, DO  aspirin 81 MG tablet Take 81 mg by mouth daily.      [provider]  Calcium Carbonate-Vitamin D (CALCIUM + D PO) Take 1 tablet by mouth daily. Reported on 12/25/2015    [provider]  Carbidopa-Levodopa ER (RYTARY) 36.25-145 MG CPCR Take 1 tablet by mouth 3 (three) times daily. 11/10/17   Tat, Eustace Quail, DO  cholecalciferol (VITAMIN D) 1000 UNITS tablet Take 1,000 Units by mouth daily.      [provider]  levothyroxine (SYNTHROID, LEVOTHROID) 75 MCG tablet Take 0.5 tablets (37.5 mcg total) by mouth daily before breakfast. 09/15/17   Rosita Fire, MD  pramipexole (MIRAPEX) 0.125 MG tablet Take 1 tablet (0.125 mg total) by mouth at bedtime. 11/10/17   Tat, Eustace Quail, DO  Simethicone (GAS-X EXTRA STRENGTH) 125 MG CAPS Take 125 mg by mouth daily as needed (gas).    [provider]  tolterodine (DETROL LA) 4 MG 24 hr capsule Take 4 mg by mouth daily.  09/03/16   [provider]  vitamin B-12 (CYANOCOBALAMIN) 1000 MCG tablet Take 1 tablet (1,000 mcg total) by mouth daily. 08/06/16   Binnie Rail, MD    Family History Family History  Problem Relation Age of Onset  . Heart attack Maternal Grandfather        70s  . Emphysema Mother   . Hypertension  Mother   . Alzheimer's disease Father   . Breast cancer Maternal Grandmother   . Cancer Maternal Grandmother        breast  . Diabetes Neg Hx   . Stroke Neg Hx   . Parkinson's disease Neg Hx     Social History Social History   Tobacco Use  . Smoking status: Never Smoker  . Smokeless tobacco: Never Used  Substance Use Topics  . Alcohol use: Yes    Alcohol/week: 0.6 oz    Types: 1 Glasses of wine per week    Comment: 0.6 oz per week  . Drug use: No     Allergies  Atorvastatin; Crestor [rosuvastatin calcium]; and Vesicare [solifenacin]   Review of Systems Review of Systems  Constitutional: Negative for chills and fever.  Respiratory: Negative for cough, chest tightness and shortness of breath.   Cardiovascular: Positive for chest pain and leg swelling. Negative for palpitations.  Gastrointestinal: Negative for abdominal pain, diarrhea, nausea and vomiting.  Genitourinary: Positive for dysuria, frequency and urgency. Negative for flank pain, pelvic pain, vaginal bleeding, vaginal discharge and vaginal pain.  Musculoskeletal: Negative for arthralgias, myalgias, neck pain and neck stiffness.  Skin: Negative for rash.  Neurological: Negative for dizziness, weakness and headaches.  All other systems reviewed and are negative.    Physical Exam Updated Vital Signs BP (!) 168/73 (BP Location: Left Arm)   Pulse 70   Temp (!) 97.4 F (36.3 C) (Oral)   Resp 18   Ht 4\' 10"  (1.473 m)   Wt 40.8 kg (90 lb)   SpO2 100%   BMI 18.81 kg/m   Physical Exam  Constitutional: She is oriented to person, place, and time. She appears well-developed and well-nourished. No distress.  HENT:  Head: Normocephalic.  Eyes: Conjunctivae are normal.  Neck: Neck supple.  Cardiovascular: Normal rate, regular rhythm and normal heart sounds.  Pulmonary/Chest: Effort normal and breath sounds normal. No respiratory distress. She has no wheezes. She has no rales. She exhibits no tenderness.    Abdominal: Soft. Bowel sounds are normal. She exhibits no distension. There is no tenderness. There is no rebound.  Musculoskeletal:  2+ lower extremity edema bilaterally, skin is erythematous, warm to the touch from just below the knees down to the feet.  Dorsal pedal pulses intact and equal bilaterally.  Legs are warm, tender.  Neurological: She is alert and oriented to person, place, and time.  Skin: Skin is warm and dry.  Psychiatric: She has a normal mood and affect. Her behavior is normal.  Nursing note and vitals reviewed.    ED Treatments / Results  Labs (all labs ordered are listed, but only abnormal results are displayed) Labs Reviewed  URINALYSIS, ROUTINE W REFLEX MICROSCOPIC - Abnormal; Notable for the following components:      Result Value   APPearance HAZY (*)    Hgb urine dipstick SMALL (*)    Nitrite POSITIVE (*)    Leukocytes, UA LARGE (*)    Bacteria, UA RARE (*)    Squamous Epithelial / LPF 0-5 (*)    All other components within normal limits  CBC WITH DIFFERENTIAL/PLATELET - Abnormal; Notable for the following components:   Hemoglobin 10.8 (*)    HCT 32.8 (*)    RDW 16.5 (*)    All other components within normal limits  COMPREHENSIVE METABOLIC PANEL - Abnormal; Notable for the following components:   Calcium 8.8 (*)    All other components within normal limits  BRAIN NATRIURETIC PEPTIDE - Abnormal; Notable for the following components:   B Natriuretic Peptide 253.9 (*)    All other components within normal limits  URINE CULTURE  LIPASE, BLOOD  I-STAT TROPONIN, ED    EKG  EKG Interpretation  Date/Time:  Friday November 27 2017 12:52:54 EST Ventricular Rate:  75 PR Interval:    QRS Duration: 90 QT Interval:  382 QTC Calculation: 427 R Axis:   -7 Text Interpretation:  Sinus rhythm Multiple ventricular premature complexes Consider anterior infarct Nonspecific T abnormalities, inferior leads Baseline wander in lead(s) V4 No significant change since  last tracing Confirmed by Blanchie Dessert [phone]) on 11/27/2017 1:36:57 PM  Radiology Dg Ribs Unilateral W/chest Left  Result Date: 11/27/2017 CLINICAL DATA:  Left-sided chest pain, no known injury, initial encounter EXAM: LEFT RIBS AND CHEST - 3+ VIEW COMPARISON:  06/13/2017 FINDINGS: Cardiac shadow is within normal limits. Aortic calcifications are seen. The lungs are clear bilaterally. No pneumothorax is noted. No acute rib fracture is noted. IMPRESSION: No acute abnormality noted. Electronically Signed   By: Inez Catalina M.D.   On: 11/27/2017 11:46    Procedures Procedures (including critical care time)  Medications Ordered in ED Medications - No data to display   Initial Impression / Assessment and Plan / ED Course  I have reviewed the triage vital signs and the nursing notes.  Pertinent labs & imaging results that were available during my care of the patient were reviewed by me and considered in my medical decision making (see chart for details).     Patient is here with several complaints.  Overall she is nontoxic-appearing, normal vital signs other than hypertension of 168/73.  Her lower extremities are edematous, will get venous duplex is, as well as blood work including CBC BMP, differential includes cellulitis, heart failure, DVT.  Will check urinalysis.  Will get EKG and chest x-ray due to left-sided rib pain.  Will monitor.   1:50 PM Patient's labs are unremarkable.  Hemoglobin is 10.8, at baseline.  BNP slightly elevated to 53.  Her venous duplex was negative for DVTs.  It did show some pulsatile venous flow which could indicate increased right heart pressure.  Concern for CHF.  Patient however denies any shortness of breath, she has normal vital signs in emergency department, oxygen saturation 100%.  Chest x-ray is negative with no evidence of pulmonary edema.  She was given 20 mg of Lasix IV here.  We will discharge her home with Lasix.  I touched base with Dr. Quay Burow  who is her primary care doctor.  Patient has an appointment with her in 4 days.  She is however instructed to call if she needs them earlier.  Patient agrees with plan of going home.  She will follow-up as needed.  Return precautions discussed.  Patient's urinalysis also consistent with UTI, will start on Keflex.  First dose given in emergency department.  Culture sent.  Vitals:   11/27/17 0953 11/27/17 0957 11/27/17 1258  BP: (!) 168/73  135/69  Pulse: 70  73  Resp: 18  18  Temp: (!) 97.4 F (36.3 C)    TempSrc: Oral    SpO2: 100%  100%  Weight:  40.8 kg (90 lb)   Height:  4\' 10"  (1.473 m)       Final Clinical Impressions(s) / ED Diagnoses   Final diagnoses:  Bilateral lower extremity edema  Lower urinary tract infectious disease    ED Discharge Orders        Ordered    furosemide (LASIX) 20 MG tablet  Daily     11/27/17 1353    cephALEXin (KEFLEX) 500 MG capsule  2 times daily     11/27/17 1353       Jeannett Senior, PA-C 11/27/17 1355    Blanchie Dessert, MD 11/28/17 2056

## 2017-11-27 NOTE — Progress Notes (Signed)
LE venous duplex prelim: negative for DVT bilat. Left baker's cyst noted. Pulsatile venous flow noted suggestive of increased right side heart pressure. Landry Mellow, RDMS, RVT

## 2017-11-27 NOTE — Discharge Instructions (Signed)
Start taking Lasix as prescribed, this is a fluid pill.  Will increase urinary urinary frequency.  Take Keflex as prescribed until all gone for urinary tract infection.  Please follow-up with family doctor, have an appointment on Wednesday.  Please call them sooner if any problems or return to emergency department.  Try to keep your legs elevated, wear compression stockings.

## 2017-11-27 NOTE — ED Triage Notes (Signed)
Patient states she was called by her PCP yesterday and was told to come to the EDfor a doppler study. Patient states she has been having bilateral LE edema x 2-3 and progressively getting worse. Patient also c/o dysuria and urinary frequency x 1 week.

## 2017-11-30 DIAGNOSIS — Z9181 History of falling: Secondary | ICD-10-CM | POA: Diagnosis not present

## 2017-11-30 DIAGNOSIS — D510 Vitamin B12 deficiency anemia due to intrinsic factor deficiency: Secondary | ICD-10-CM | POA: Diagnosis not present

## 2017-11-30 DIAGNOSIS — G20 Parkinson's disease: Secondary | ICD-10-CM | POA: Diagnosis not present

## 2017-11-30 DIAGNOSIS — M542 Cervicalgia: Secondary | ICD-10-CM | POA: Diagnosis not present

## 2017-11-30 DIAGNOSIS — M1991 Primary osteoarthritis, unspecified site: Secondary | ICD-10-CM | POA: Diagnosis not present

## 2017-11-30 DIAGNOSIS — G2581 Restless legs syndrome: Secondary | ICD-10-CM | POA: Diagnosis not present

## 2017-11-30 DIAGNOSIS — D181 Lymphangioma, any site: Secondary | ICD-10-CM | POA: Diagnosis not present

## 2017-11-30 DIAGNOSIS — Z8673 Personal history of transient ischemic attack (TIA), and cerebral infarction without residual deficits: Secondary | ICD-10-CM | POA: Diagnosis not present

## 2017-11-30 DIAGNOSIS — N390 Urinary tract infection, site not specified: Secondary | ICD-10-CM | POA: Diagnosis not present

## 2017-11-30 LAB — URINE CULTURE: Culture: >=100000 — AB

## 2017-12-01 ENCOUNTER — Telehealth: Payer: Self-pay | Admitting: Emergency Medicine

## 2017-12-01 DIAGNOSIS — Z8673 Personal history of transient ischemic attack (TIA), and cerebral infarction without residual deficits: Secondary | ICD-10-CM | POA: Diagnosis not present

## 2017-12-01 DIAGNOSIS — M1991 Primary osteoarthritis, unspecified site: Secondary | ICD-10-CM | POA: Diagnosis not present

## 2017-12-01 DIAGNOSIS — D510 Vitamin B12 deficiency anemia due to intrinsic factor deficiency: Secondary | ICD-10-CM | POA: Diagnosis not present

## 2017-12-01 DIAGNOSIS — D181 Lymphangioma, any site: Secondary | ICD-10-CM | POA: Diagnosis not present

## 2017-12-01 DIAGNOSIS — N390 Urinary tract infection, site not specified: Secondary | ICD-10-CM | POA: Diagnosis not present

## 2017-12-01 DIAGNOSIS — G20 Parkinson's disease: Secondary | ICD-10-CM | POA: Diagnosis not present

## 2017-12-01 DIAGNOSIS — Z9181 History of falling: Secondary | ICD-10-CM | POA: Diagnosis not present

## 2017-12-01 DIAGNOSIS — G2581 Restless legs syndrome: Secondary | ICD-10-CM | POA: Diagnosis not present

## 2017-12-01 DIAGNOSIS — M542 Cervicalgia: Secondary | ICD-10-CM | POA: Diagnosis not present

## 2017-12-01 NOTE — Telephone Encounter (Signed)
Post ED Visit - Positive Culture Follow-up: Successful Patient Follow-Up  Culture assessed and recommendations reviewed by: []  Elenor Quinones, Pharm.D. []  Heide Guile, Pharm.D., BCPS AQ-ID []  Parks Neptune, Pharm.D., BCPS []  Alycia Rossetti, Pharm.D., BCPS []  [place], Pharm.D., BCPS, AAHIVP []  Legrand Como, Pharm.D., BCPS, AAHIVP []  Salome Arnt, PharmD, BCPS []  Dimitri Ped, PharmD, BCPS []  Vincenza Hews, PharmD, BCPS Jalene Mullet Pharm D  Positive urine culture  []  Patient discharged without antimicrobial prescription and treatment is now indicated [x]  Organism is resistant to prescribed ED discharge antimicrobial []  Patient with positive blood cultures  Changes discussed with ED provider:  New antibiotic prescription d/c keflex, start macrobid 100mg  po bid x 5 days  Attempting to contact patient   Hazle Nordmann 12/01/2017, 11:48 AM

## 2017-12-01 NOTE — Progress Notes (Signed)
ED Antimicrobial Stewardship Positive Culture Follow Up   Elizabeth [name] is an 84 y.o. female who presented to [facility] on 11/27/2017 with a chief complaint of  Chief Complaint  Patient presents with  . Leg Swelling  . Dysuria    Recent Results (from the past 720 hour(s))  Urine culture     Status: Abnormal   Collection Time: 11/27/17 12:12 PM  Result Value Ref Range Status   Specimen Description URINE, CLEAN CATCH  Final   Special Requests NONE  Final   Culture >=100,000 COLONIES/mL CITROBACTER FREUNDII (A)  Final   Report Status 11/30/2017 FINAL  Final   Organism ID, Bacteria CITROBACTER FREUNDII (A)  Final      Susceptibility   Citrobacter freundii - MIC*    CEFAZOLIN >=64 RESISTANT Resistant     CEFTRIAXONE <=1 SENSITIVE Sensitive     CIPROFLOXACIN 2 INTERMEDIATE Intermediate     GENTAMICIN <=1 SENSITIVE Sensitive     IMIPENEM 0.5 SENSITIVE Sensitive     NITROFURANTOIN <=16 SENSITIVE Sensitive     TRIMETH/SULFA >=320 RESISTANT Resistant     * >=100,000 COLONIES/mL CITROBACTER FREUNDII    [x]  Treated with keflex, organism resistant to prescribed antimicrobial   New antibiotic prescription: Macrobid 100mg  PO BID x 5 days and stop taking previously prescribed keflex  ED Provider: Alferd Apa, PA-C   Jalene Mullet, Pharm.D. PGY1 Pharmacy Resident 12/01/2017 9:15 AM Main Pharmacy: [phone] Phone# [phone]

## 2017-12-02 ENCOUNTER — Other Ambulatory Visit (INDEPENDENT_AMBULATORY_CARE_PROVIDER_SITE_OTHER): Payer: Medicare Other

## 2017-12-02 ENCOUNTER — Telehealth: Payer: Self-pay | Admitting: Internal Medicine

## 2017-12-02 ENCOUNTER — Ambulatory Visit: Payer: Medicare Other | Admitting: Internal Medicine

## 2017-12-02 ENCOUNTER — Encounter: Payer: Self-pay | Admitting: Internal Medicine

## 2017-12-02 VITALS — BP 148/84 | HR 86 | Temp 98.0°F | Resp 16 | Wt 88.0 lb

## 2017-12-02 DIAGNOSIS — Z8673 Personal history of transient ischemic attack (TIA), and cerebral infarction without residual deficits: Secondary | ICD-10-CM | POA: Diagnosis not present

## 2017-12-02 DIAGNOSIS — R600 Localized edema: Secondary | ICD-10-CM

## 2017-12-02 DIAGNOSIS — N390 Urinary tract infection, site not specified: Secondary | ICD-10-CM

## 2017-12-02 DIAGNOSIS — D181 Lymphangioma, any site: Secondary | ICD-10-CM | POA: Diagnosis not present

## 2017-12-02 DIAGNOSIS — G2581 Restless legs syndrome: Secondary | ICD-10-CM | POA: Diagnosis not present

## 2017-12-02 DIAGNOSIS — D510 Vitamin B12 deficiency anemia due to intrinsic factor deficiency: Secondary | ICD-10-CM | POA: Diagnosis not present

## 2017-12-02 DIAGNOSIS — M1991 Primary osteoarthritis, unspecified site: Secondary | ICD-10-CM | POA: Diagnosis not present

## 2017-12-02 DIAGNOSIS — Z9181 History of falling: Secondary | ICD-10-CM | POA: Diagnosis not present

## 2017-12-02 DIAGNOSIS — M542 Cervicalgia: Secondary | ICD-10-CM | POA: Diagnosis not present

## 2017-12-02 DIAGNOSIS — G20 Parkinson's disease: Secondary | ICD-10-CM | POA: Diagnosis not present

## 2017-12-02 LAB — COMPREHENSIVE METABOLIC PANEL
ALK PHOS: 89 U/L (ref 39–117)
ALT: 4 U/L (ref 0–35)
AST: 26 U/L (ref 0–37)
Albumin: 3.9 g/dL (ref 3.5–5.2)
BILIRUBIN TOTAL: 0.9 mg/dL (ref 0.2–1.2)
BUN: 17 mg/dL (ref 6–23)
CO2: 29 meq/L (ref 19–32)
CREATININE: 0.74 mg/dL (ref 0.40–1.20)
Calcium: 9.2 mg/dL (ref 8.4–10.5)
Chloride: 101 mEq/L (ref 96–112)
GFR: 79.31 mL/min (ref >60.00)
GLUCOSE: 97 mg/dL (ref 70–99)
Potassium: 4 mEq/L (ref 3.5–5.1)
Sodium: 138 mEq/L (ref 135–145)
TOTAL PROTEIN: 6.5 g/dL (ref 6.0–8.3)

## 2017-12-02 MED ORDER — SERTRALINE HCL 25 MG PO TABS: 25.0000 mg | ORAL_TABLET | Freq: Every day | ORAL | 5 refills | Status: DC

## 2017-12-02 NOTE — Assessment & Plan Note (Signed)
Will start macrobid today and complete full prescription

## 2017-12-02 NOTE — Patient Instructions (Addendum)
  Test(s) ordered today. Your results will be released to [facility] (or called to you) after review, usually within 72hours after test completion. If any changes need to be made, you will be notified at that same time.   Medications reviewed and updated.  Changes include starting sertraline 25 mg nightly for anxiety/depression.  This was sent to your pharmacy.     An echocardiogram (ultrasound of your heart) was ordered.  Someone will call you to schedule this.  Please followup in 3 months   Call if your leg swelling does not improve.

## 2017-12-02 NOTE — Telephone Encounter (Signed)
Copied from [place] [id number]. Topic: Inquiry >> Dec 02, 2017  4:33 PM Oliver Pila B wrote: Reason for CRM: Flor from [facility] called to get re-certification orders for 2 weeks, contact [place] [facility]

## 2017-12-02 NOTE — Assessment & Plan Note (Signed)
Started on 20 mg Lasix daily Likely element of mild CHF She lost 2 lbs since she was here last Her leg edema has improved, still 2 + pitting edema Will order an echocardiogram to evaluate diastolic dysfunction Elevate legs when sitting ? Compliant low salt diet - stressed low sodium  cmp today May need to increase lasix for brief period of time but will try to treat conservatively given high risk of falls, fraility FU in 3 months, sooner if swelling not improving

## 2017-12-02 NOTE — Progress Notes (Signed)
Subjective:    Patient ID: Elizabeth [name], female    DOB: 05/09/1931, 84 y.o.   MRN: [id number]  HPI The patient is here for follow up.  She was seen here 11/25/17 and a nurse practitioner for severe painful swelling in both legs for the past 1-2 weeks.  There is no history of CHF.  The swelling continued to get worse and there was some redness that developed in the past few days.  She is not experiencing any shortness of breath, but was experiencing some left rib pain from coughing. Her oxygen saturation is a little low.  She had 2+ pitting edema in her bilateral lower extremities with erythema.  It was advised that she go to the emergency room for further evaluation.  She did go to the emergency room, but left prior to getting an ultrasound done because of the long wait.  She went to the emergency room 11/27/17 for her leg edema.  Her legs were painful.  The pain was worse at rest and better with walking around.  She was not experiencing any fevers or chills.  She still had pain in the left ribs.  She had fallen a couple of weeks prior.  She was not experiencing any shortness of breath.  She did state some right scapular pain when eating.  She complained that food would get stuck when she ate sometimes and it was painful.  She denied any abdominal pain or nausea.  She also complained about dysuria, urinary frequency and urgency.  Her blood pressure was slightly elevated.  She had an ultrasound of her legs and it was negative for DVT.  Urinalysis shows possible UTI.  EKG and chest x-ray within normal limits. BNP was slightly elevated at 253.  She received IV Lasix in the emergency room at 20 mg.  She was prescribed Keflex for her UTI.  UTI: She is still taking Keflex Bacteria not sensitive to Keflex  -  She just picked up new prescription of macrobid and will start taking it today  Lower extremity edema: Ultrasound of lower extremities in emergency room negative for DVT Received 20 mg of IV Lasix in  the ED Started on 20 mg Lasix daily Likely element of mild CHF She lost 2 lbs since she was here last Her leg edema has improved Will order an echocardiogram to evaluate diastolic dysfunction Elevate legs when sitting ? Compliant low salt diet - stressed low sodium diet      Medications and allergies reviewed with patient and updated if appropriate.  Patient Active Problem List   Diagnosis Date Noted  . Bilateral leg edema 12/02/2017  . Klebsiella cystitis 09/19/2017  . Overactive bladder 09/19/2017  . Pernicious anemia   . Post-polio syndrome   . Vitamin B12 deficiency   . Fall 09/11/2017  . Subdural hygroma 09/11/2017  . Weakness 06/24/2017  . UTI (urinary tract infection) 06/24/2017  . Swallowing disorder 03/04/2017  . Parkinson disease ([place]) 06/25/2016  . Restless leg syndrome 12/25/2015  . Arthritis 12/25/2015  . Abnormality of gait 05/24/2015  . Intention tremor 12/08/2013  . History of CVA (cerebrovascular accident) 07/20/2012  . IRRITABLE BOWEL SYNDROME 01/17/2009  . Cervicalgia 01/17/2009  . Irritable bowel syndrome (IBS) 01/17/2009  . Urinary incontinence 09/07/2008  . ANEMIA, B12 DEFICIENCY 04/26/2008  . HYPERLIPIDEMIA 01/26/2008  . History of TIA (transient ischemic attack) 01/26/2008  . Hypothyroidism 01/04/2008  . Osteopenia 01/04/2008  . Abnormal Pap smear of vagina 09/24/2006    Current  Outpatient Medications on File Prior to Visit  Medication Sig Dispense Refill  . AMBULATORY NON FORMULARY MEDICATION Lift Chair DX: G20 1 Device 0  . aspirin 81 MG tablet Take 81 mg by mouth daily.      . Calcium Carbonate-Vitamin D (CALCIUM + D PO) Take 1 tablet by mouth daily. Reported on 12/25/2015    . Carbidopa-Levodopa ER (RYTARY) 36.25-145 MG CPCR Take 1 tablet by mouth 3 (three) times daily. 90 capsule 3  . cholecalciferol (VITAMIN D) 1000 UNITS tablet Take 1,000 Units by mouth daily.      . furosemide (LASIX) 20 MG tablet Take 1 tablet (20 mg total) by mouth  daily. 10 tablet 0  . levothyroxine (SYNTHROID, LEVOTHROID) 75 MCG tablet Take 0.5 tablets (37.5 mcg total) by mouth daily before breakfast.    . pramipexole (MIRAPEX) 0.125 MG tablet Take 1 tablet (0.125 mg total) by mouth at bedtime. 30 tablet 3  . Simethicone (GAS-X EXTRA STRENGTH) 125 MG CAPS Take 125 mg by mouth daily as needed (gas).    . tolterodine (DETROL LA) 4 MG 24 hr capsule Take 4 mg by mouth daily.     . vitamin B-12 (CYANOCOBALAMIN) 1000 MCG tablet Take 1 tablet (1,000 mcg total) by mouth daily. 90 tablet 3  . cephALEXin (KEFLEX) 500 MG capsule Take 1 capsule (500 mg total) by mouth 2 (two) times daily. (Patient not taking: Reported on 12/02/2017) 14 capsule 0   No current facility-administered medications on file prior to visit.     Past Medical History:  Diagnosis Date  . Abnormal Pap smear of vagina 09/2006   Dr Nori Riis  . Abnormality of gait 05/24/2015   PMH R occipital infarct B12 deficiency H/o polio affected left leg  In Physical Therapy to improve balance & strength through Noel Gerold  . Arthritis 12/25/2015  . Cervicalgia 01/17/2009   MRI 03/27/16:  IMPRESSION: 1. At C3-4 there is a mild broad-based disc bulge. Moderate bilateral facet arthropathy and uncovertebral degenerative changes resulting in moderate bilateral foraminal stenosis. 2. At C5-6 there is a broad-based disc osteophyte complex. Bilateral uncovertebral degenerative changes and facet arthropathy resulting in moderate bilateral foraminal stenosis.  . Fall 09/11/2017  . History of CVA (cerebrovascular accident) 07/20/2012   Chronic R occipital infarct ; stable on CT 05/21/2012 .Dr Jannifer Franklin , Neurology   . History of TIA (transient ischemic attack) 01/26/2008  . HYPERLIPIDEMIA 01/26/2008   NMR Lipoprofile 2009: LDL 135 (1704/911), HDL 62, TG 131. LDL goal = < 100; ideally < 70. MGF MI in 70s No FH CVA  . Hypothyroidism   . Intention tremor 12/08/2013   Onset after fall 04/2012   . Irritable bowel syndrome (IBS)  01/17/2009   Dr. Unice Cobble  . Osteopenia    PMH fracture heel, wrist  . Parkinson disease ([place]) 06/25/2016   Dr Tat  . Pernicious anemia   . Post-polio syndrome    minor problems L leg  . Restless leg syndrome 12/25/2015   Dr Tat  . Subdural hygroma 09/11/2017  . Swallowing disorder 03/04/2017  . Urinary incontinence 09/07/2008   Dr McDiarmid, Urology   . Vitamin B12 deficiency   . Weakness 06/24/2017    Past Surgical History:  Procedure Laterality Date  . BLADDER SUSPENSION  2000  . CATARACT EXTRACTION, BILATERAL     Dr. Gershon Crane  . no colonoscopy     "I never felt I needed one "  . YAG LASER APPLICATION Left    Dr. Gershon Crane  Social History   Socioeconomic History  . Marital status: Single    Spouse name: None  . Number of children: None  . Years of education: None  . Highest education level: None  Social Needs  . Financial resource strain: None  . Food insecurity - worry: None  . Food insecurity - inability: None  . Transportation needs - medical: None  . Transportation needs - non-medical: None  Occupational History  . None  Tobacco Use  . Smoking status: Never Smoker  . Smokeless tobacco: Never Used  Substance and Sexual Activity  . Alcohol use: Yes    Alcohol/week: 0.6 oz    Types: 1 Glasses of wine per week    Comment: 0.6 oz per week  . Drug use: No  . Sexual activity: None  Other Topics Concern  . None  Social History Narrative  . None    Family History  Problem Relation Age of Onset  . Heart attack Maternal Grandfather        70s  . Emphysema Mother   . Hypertension Mother   . Alzheimer's disease Father   . Breast cancer Maternal Grandmother   . Cancer Maternal Grandmother        breast  . Diabetes Neg Hx   . Stroke Neg Hx   . Parkinson's disease Neg Hx     Review of Systems  Constitutional: Negative for chills and fever.  Respiratory: Negative for cough, shortness of breath and wheezing.   Cardiovascular: Positive for leg  swelling. Negative for chest pain and palpitations.  Gastrointestinal: Negative for abdominal pain and nausea.  Genitourinary: Positive for frequency. Negative for dysuria and hematuria.  Neurological: Negative for light-headedness and headaches.       Objective:   Vitals:   12/02/17 1430  BP: (!) 148/84  Pulse: 86  Resp: 16  Temp: 98 F (36.7 C)  SpO2: 98%   Wt Readings from Last 3 Encounters:  12/02/17 88 lb (39.9 kg)  11/27/17 90 lb (40.8 kg)  11/25/17 90 lb (40.8 kg)   Body mass index is 18.39 kg/m.   Physical Exam    Constitutional: Appears well-developed and well-nourished. No distress.  HENT:  Head: Normocephalic and atraumatic.  Neck: Neck supple. No tracheal deviation present. No thyromegaly present.  No cervical lymphadenopathy Cardiovascular: Normal rate, regular rhythm and normal heart sounds.   No murmur heard. No carotid bruit .  2 + pitting b/l LE edema with mild erythema Pulmonary/Chest: Effort normal and breath sounds normal. No respiratory distress. No has no wheezes. No rales.  Abdomen: soft, non tender, non distended Skin: Skin is warm and dry. Not diaphoretic. no skin breaks Psychiatric: Normal mood and affect. Behavior is normal.      Assessment & Plan:    See Problem List for Assessment and Plan of chronic medical problems.

## 2017-12-03 NOTE — Telephone Encounter (Signed)
Spoke with Orland Mustard to give verbal orders for re-cert per MD

## 2017-12-04 DIAGNOSIS — N390 Urinary tract infection, site not specified: Secondary | ICD-10-CM | POA: Diagnosis not present

## 2017-12-04 DIAGNOSIS — G20 Parkinson's disease: Secondary | ICD-10-CM | POA: Diagnosis not present

## 2017-12-04 DIAGNOSIS — D181 Lymphangioma, any site: Secondary | ICD-10-CM | POA: Diagnosis not present

## 2017-12-04 DIAGNOSIS — Z9181 History of falling: Secondary | ICD-10-CM | POA: Diagnosis not present

## 2017-12-04 DIAGNOSIS — M1991 Primary osteoarthritis, unspecified site: Secondary | ICD-10-CM | POA: Diagnosis not present

## 2017-12-04 DIAGNOSIS — D510 Vitamin B12 deficiency anemia due to intrinsic factor deficiency: Secondary | ICD-10-CM | POA: Diagnosis not present

## 2017-12-04 DIAGNOSIS — G2581 Restless legs syndrome: Secondary | ICD-10-CM | POA: Diagnosis not present

## 2017-12-04 DIAGNOSIS — M542 Cervicalgia: Secondary | ICD-10-CM | POA: Diagnosis not present

## 2017-12-04 DIAGNOSIS — Z8673 Personal history of transient ischemic attack (TIA), and cerebral infarction without residual deficits: Secondary | ICD-10-CM | POA: Diagnosis not present

## 2017-12-07 DIAGNOSIS — D181 Lymphangioma, any site: Secondary | ICD-10-CM | POA: Diagnosis not present

## 2017-12-07 DIAGNOSIS — G20 Parkinson's disease: Secondary | ICD-10-CM | POA: Diagnosis not present

## 2017-12-07 DIAGNOSIS — M1991 Primary osteoarthritis, unspecified site: Secondary | ICD-10-CM | POA: Diagnosis not present

## 2017-12-07 DIAGNOSIS — Z8673 Personal history of transient ischemic attack (TIA), and cerebral infarction without residual deficits: Secondary | ICD-10-CM | POA: Diagnosis not present

## 2017-12-07 DIAGNOSIS — Z9181 History of falling: Secondary | ICD-10-CM | POA: Diagnosis not present

## 2017-12-07 DIAGNOSIS — N390 Urinary tract infection, site not specified: Secondary | ICD-10-CM | POA: Diagnosis not present

## 2017-12-07 DIAGNOSIS — D510 Vitamin B12 deficiency anemia due to intrinsic factor deficiency: Secondary | ICD-10-CM | POA: Diagnosis not present

## 2017-12-07 DIAGNOSIS — G2581 Restless legs syndrome: Secondary | ICD-10-CM | POA: Diagnosis not present

## 2017-12-07 DIAGNOSIS — M542 Cervicalgia: Secondary | ICD-10-CM | POA: Diagnosis not present

## 2017-12-09 DIAGNOSIS — G2581 Restless legs syndrome: Secondary | ICD-10-CM | POA: Diagnosis not present

## 2017-12-09 DIAGNOSIS — G20 Parkinson's disease: Secondary | ICD-10-CM | POA: Diagnosis not present

## 2017-12-09 DIAGNOSIS — Z8673 Personal history of transient ischemic attack (TIA), and cerebral infarction without residual deficits: Secondary | ICD-10-CM | POA: Diagnosis not present

## 2017-12-09 DIAGNOSIS — M542 Cervicalgia: Secondary | ICD-10-CM | POA: Diagnosis not present

## 2017-12-09 DIAGNOSIS — N390 Urinary tract infection, site not specified: Secondary | ICD-10-CM | POA: Diagnosis not present

## 2017-12-09 DIAGNOSIS — Z9181 History of falling: Secondary | ICD-10-CM | POA: Diagnosis not present

## 2017-12-09 DIAGNOSIS — D181 Lymphangioma, any site: Secondary | ICD-10-CM | POA: Diagnosis not present

## 2017-12-09 DIAGNOSIS — D510 Vitamin B12 deficiency anemia due to intrinsic factor deficiency: Secondary | ICD-10-CM | POA: Diagnosis not present

## 2017-12-09 DIAGNOSIS — M1991 Primary osteoarthritis, unspecified site: Secondary | ICD-10-CM | POA: Diagnosis not present

## 2017-12-10 DIAGNOSIS — D510 Vitamin B12 deficiency anemia due to intrinsic factor deficiency: Secondary | ICD-10-CM | POA: Diagnosis not present

## 2017-12-10 DIAGNOSIS — M1991 Primary osteoarthritis, unspecified site: Secondary | ICD-10-CM | POA: Diagnosis not present

## 2017-12-10 DIAGNOSIS — G20 Parkinson's disease: Secondary | ICD-10-CM | POA: Diagnosis not present

## 2017-12-10 DIAGNOSIS — M542 Cervicalgia: Secondary | ICD-10-CM | POA: Diagnosis not present

## 2017-12-10 DIAGNOSIS — N390 Urinary tract infection, site not specified: Secondary | ICD-10-CM | POA: Diagnosis not present

## 2017-12-10 DIAGNOSIS — Z8673 Personal history of transient ischemic attack (TIA), and cerebral infarction without residual deficits: Secondary | ICD-10-CM | POA: Diagnosis not present

## 2017-12-10 DIAGNOSIS — Z9181 History of falling: Secondary | ICD-10-CM | POA: Diagnosis not present

## 2017-12-10 DIAGNOSIS — G2581 Restless legs syndrome: Secondary | ICD-10-CM | POA: Diagnosis not present

## 2017-12-10 DIAGNOSIS — D181 Lymphangioma, any site: Secondary | ICD-10-CM | POA: Diagnosis not present

## 2017-12-14 DIAGNOSIS — M1991 Primary osteoarthritis, unspecified site: Secondary | ICD-10-CM | POA: Diagnosis not present

## 2017-12-14 DIAGNOSIS — N390 Urinary tract infection, site not specified: Secondary | ICD-10-CM | POA: Diagnosis not present

## 2017-12-14 DIAGNOSIS — Z9181 History of falling: Secondary | ICD-10-CM | POA: Diagnosis not present

## 2017-12-14 DIAGNOSIS — G2581 Restless legs syndrome: Secondary | ICD-10-CM | POA: Diagnosis not present

## 2017-12-14 DIAGNOSIS — Z8673 Personal history of transient ischemic attack (TIA), and cerebral infarction without residual deficits: Secondary | ICD-10-CM | POA: Diagnosis not present

## 2017-12-14 DIAGNOSIS — M542 Cervicalgia: Secondary | ICD-10-CM | POA: Diagnosis not present

## 2017-12-14 DIAGNOSIS — D181 Lymphangioma, any site: Secondary | ICD-10-CM | POA: Diagnosis not present

## 2017-12-14 DIAGNOSIS — G20 Parkinson's disease: Secondary | ICD-10-CM | POA: Diagnosis not present

## 2017-12-14 DIAGNOSIS — D510 Vitamin B12 deficiency anemia due to intrinsic factor deficiency: Secondary | ICD-10-CM | POA: Diagnosis not present

## 2017-12-15 DIAGNOSIS — Z9181 History of falling: Secondary | ICD-10-CM | POA: Diagnosis not present

## 2017-12-15 DIAGNOSIS — G2581 Restless legs syndrome: Secondary | ICD-10-CM | POA: Diagnosis not present

## 2017-12-15 DIAGNOSIS — M1991 Primary osteoarthritis, unspecified site: Secondary | ICD-10-CM | POA: Diagnosis not present

## 2017-12-15 DIAGNOSIS — N390 Urinary tract infection, site not specified: Secondary | ICD-10-CM | POA: Diagnosis not present

## 2017-12-15 DIAGNOSIS — D181 Lymphangioma, any site: Secondary | ICD-10-CM | POA: Diagnosis not present

## 2017-12-15 DIAGNOSIS — D510 Vitamin B12 deficiency anemia due to intrinsic factor deficiency: Secondary | ICD-10-CM | POA: Diagnosis not present

## 2017-12-15 DIAGNOSIS — M542 Cervicalgia: Secondary | ICD-10-CM | POA: Diagnosis not present

## 2017-12-15 DIAGNOSIS — Z8673 Personal history of transient ischemic attack (TIA), and cerebral infarction without residual deficits: Secondary | ICD-10-CM | POA: Diagnosis not present

## 2017-12-15 DIAGNOSIS — G20 Parkinson's disease: Secondary | ICD-10-CM | POA: Diagnosis not present

## 2017-12-16 DIAGNOSIS — D181 Lymphangioma, any site: Secondary | ICD-10-CM | POA: Diagnosis not present

## 2017-12-16 DIAGNOSIS — Z9181 History of falling: Secondary | ICD-10-CM | POA: Diagnosis not present

## 2017-12-16 DIAGNOSIS — N390 Urinary tract infection, site not specified: Secondary | ICD-10-CM | POA: Diagnosis not present

## 2017-12-16 DIAGNOSIS — Z8673 Personal history of transient ischemic attack (TIA), and cerebral infarction without residual deficits: Secondary | ICD-10-CM | POA: Diagnosis not present

## 2017-12-16 DIAGNOSIS — D510 Vitamin B12 deficiency anemia due to intrinsic factor deficiency: Secondary | ICD-10-CM | POA: Diagnosis not present

## 2017-12-16 DIAGNOSIS — M542 Cervicalgia: Secondary | ICD-10-CM | POA: Diagnosis not present

## 2017-12-16 DIAGNOSIS — G20 Parkinson's disease: Secondary | ICD-10-CM | POA: Diagnosis not present

## 2017-12-16 DIAGNOSIS — M1991 Primary osteoarthritis, unspecified site: Secondary | ICD-10-CM | POA: Diagnosis not present

## 2017-12-16 DIAGNOSIS — G2581 Restless legs syndrome: Secondary | ICD-10-CM | POA: Diagnosis not present

## 2017-12-17 ENCOUNTER — Other Ambulatory Visit (HOSPITAL_COMMUNITY): Payer: Medicare Other

## 2017-12-17 DIAGNOSIS — M1991 Primary osteoarthritis, unspecified site: Secondary | ICD-10-CM | POA: Diagnosis not present

## 2017-12-17 DIAGNOSIS — Z9181 History of falling: Secondary | ICD-10-CM | POA: Diagnosis not present

## 2017-12-17 DIAGNOSIS — N390 Urinary tract infection, site not specified: Secondary | ICD-10-CM | POA: Diagnosis not present

## 2017-12-17 DIAGNOSIS — M542 Cervicalgia: Secondary | ICD-10-CM | POA: Diagnosis not present

## 2017-12-17 DIAGNOSIS — D510 Vitamin B12 deficiency anemia due to intrinsic factor deficiency: Secondary | ICD-10-CM | POA: Diagnosis not present

## 2017-12-17 DIAGNOSIS — Z8673 Personal history of transient ischemic attack (TIA), and cerebral infarction without residual deficits: Secondary | ICD-10-CM | POA: Diagnosis not present

## 2017-12-17 DIAGNOSIS — G20 Parkinson's disease: Secondary | ICD-10-CM | POA: Diagnosis not present

## 2017-12-17 DIAGNOSIS — G2581 Restless legs syndrome: Secondary | ICD-10-CM | POA: Diagnosis not present

## 2017-12-17 DIAGNOSIS — D181 Lymphangioma, any site: Secondary | ICD-10-CM | POA: Diagnosis not present

## 2017-12-20 NOTE — Progress Notes (Signed)
Subjective:    Patient ID: Elizabeth [name], female    DOB: [birth year], 84 y.o.   MRN: [id number]  HPI She is here for an acute visit.   Bilateral foot pain: She has chronic pain in both of her feet, but has gotten worse recently.  The right foot hurts on the dorsal aspect of the foot, which may be related to foot swelling.  She also has pain in the plantar surface at the ball of the foot.  She has dorsal first toe pain where she has a callus.  She has difficulty wearing most shoes.  She does get a pedicure regularly and that seems to help when the calluses are shaved down.  She has left foot pain across the plantar surface of her ball of her foot.  She has 2 calluses in that area.  She has never seen a podiatrist and would like a referral for 1.  She has pain with sitting or standing or walking.  Bilateral leg edema: She continues to experience bilateral leg edema.  She states there has been slight improvement since she was here earlier this month.  She is taking her Lasix 20 mg daily.  It does make her urinate a lot.  She elevates her legs when she is sitting.  She is compliant with a low-sodium diet.  She is not experiencing any shortness of breath.  She plans on getting compression hose soon.  She continues to have some mild redness in both legs and this is better in the morning.  She does not feel sick.  The redness has not worsened.   For a while she has been experiencing left lower rib tenderness.  She did have an x-ray 11/27/17 and there is no fracture.  She did have several falls and may have injured it during a fall.  The pain is better-it is just intermittent.     Medications and allergies reviewed with patient and updated if appropriate.  Patient Active Problem List   Diagnosis Date Noted  . Bilateral leg edema 12/02/2017  . Klebsiella cystitis 09/19/2017  . Overactive bladder 09/19/2017  . Pernicious anemia   . Post-polio syndrome   . Vitamin B12 deficiency   . Fall 09/11/2017    . Subdural hygroma 09/11/2017  . Weakness 06/24/2017  . UTI (urinary tract infection) 06/24/2017  . Swallowing disorder 03/04/2017  . Parkinson disease ([place]) 06/25/2016  . Restless leg syndrome 12/25/2015  . Arthritis 12/25/2015  . Abnormality of gait 05/24/2015  . Intention tremor 12/08/2013  . History of CVA (cerebrovascular accident) 07/20/2012  . IRRITABLE BOWEL SYNDROME 01/17/2009  . Cervicalgia 01/17/2009  . Irritable bowel syndrome (IBS) 01/17/2009  . Urinary incontinence 09/07/2008  . ANEMIA, B12 DEFICIENCY 04/26/2008  . HYPERLIPIDEMIA 01/26/2008  . History of TIA (transient ischemic attack) 01/26/2008  . Hypothyroidism 01/04/2008  . Osteopenia 01/04/2008  . Abnormal Pap smear of vagina 09/24/2006    Current Outpatient Medications on File Prior to Visit  Medication Sig Dispense Refill  . AMBULATORY NON FORMULARY MEDICATION Lift Chair DX: G20 1 Device 0  . aspirin 81 MG tablet Take 81 mg by mouth daily.      . Calcium Carbonate-Vitamin D (CALCIUM + D PO) Take 1 tablet by mouth daily. Reported on 12/25/2015    . Carbidopa-Levodopa ER (RYTARY) 36.25-145 MG CPCR Take 1 tablet by mouth 3 (three) times daily. 90 capsule 3  . cholecalciferol (VITAMIN D) 1000 UNITS tablet Take 1,000 Units by mouth daily.      Marland Kitchen  furosemide (LASIX) 20 MG tablet Take 1 tablet (20 mg total) by mouth daily. 10 tablet 0  . levothyroxine (SYNTHROID, LEVOTHROID) 75 MCG tablet Take 0.5 tablets (37.5 mcg total) by mouth daily before breakfast.    . nitrofurantoin, macrocrystal-monohydrate, (MACROBID) 100 MG capsule     . pramipexole (MIRAPEX) 0.125 MG tablet Take 1 tablet (0.125 mg total) by mouth at bedtime. 30 tablet 3  . sertraline (ZOLOFT) 25 MG tablet Take 1 tablet (25 mg total) by mouth at bedtime. 30 tablet 5  . Simethicone (GAS-X EXTRA STRENGTH) 125 MG CAPS Take 125 mg by mouth daily as needed (gas).    . tolterodine (DETROL LA) 4 MG 24 hr capsule Take 4 mg by mouth daily.     . vitamin B-12  (CYANOCOBALAMIN) 1000 MCG tablet Take 1 tablet (1,000 mcg total) by mouth daily. 90 tablet 3   No current facility-administered medications on file prior to visit.     Past Medical History:  Diagnosis Date  . Abnormal Pap smear of vagina 09/2006   Dr Nori Riis  . Abnormality of gait 05/24/2015   PMH R occipital infarct B12 deficiency H/o polio affected left leg  In Physical Therapy to improve balance & strength through Noel Gerold  . Arthritis 12/25/2015  . Cervicalgia 01/17/2009   MRI 03/27/16:  IMPRESSION: 1. At C3-4 there is a mild broad-based disc bulge. Moderate bilateral facet arthropathy and uncovertebral degenerative changes resulting in moderate bilateral foraminal stenosis. 2. At C5-6 there is a broad-based disc osteophyte complex. Bilateral uncovertebral degenerative changes and facet arthropathy resulting in moderate bilateral foraminal stenosis.  . Fall 09/11/2017  . History of CVA (cerebrovascular accident) 07/20/2012   Chronic R occipital infarct ; stable on CT 05/21/2012 .Dr Jannifer Franklin , Neurology   . History of TIA (transient ischemic attack) 01/26/2008  . HYPERLIPIDEMIA 01/26/2008   NMR Lipoprofile 2009: LDL 135 (1704/911), HDL 62, TG 131. LDL goal = < 100; ideally < 70. MGF MI in 26s No FH CVA  . Hypothyroidism   . Intention tremor 12/08/2013   Onset after fall 04/2012   . Irritable bowel syndrome (IBS) 01/17/2009   Dr. Unice Cobble  . Osteopenia    PMH fracture heel, wrist  . Parkinson disease ([place]) 06/25/2016   Dr Tat  . Pernicious anemia   . Post-polio syndrome    minor problems L leg  . Restless leg syndrome 12/25/2015   Dr Tat  . Subdural hygroma 09/11/2017  . Swallowing disorder 03/04/2017  . Urinary incontinence 09/07/2008   Dr McDiarmid, Urology   . Vitamin B12 deficiency   . Weakness 06/24/2017    Past Surgical History:  Procedure Laterality Date  . BLADDER SUSPENSION  2000  . CATARACT EXTRACTION, BILATERAL     Dr. Gershon Crane  . no colonoscopy     "I never felt I  needed one "  . YAG LASER APPLICATION Left    Dr. Gershon Crane    Social History   Socioeconomic History  . Marital status: Single    Spouse name: None  . Number of children: None  . Years of education: None  . Highest education level: None  Social Needs  . Financial resource strain: None  . Food insecurity - worry: None  . Food insecurity - inability: None  . Transportation needs - medical: None  . Transportation needs - non-medical: None  Occupational History  . None  Tobacco Use  . Smoking status: Never Smoker  . Smokeless tobacco: Never Used  Substance and  Sexual Activity  . Alcohol use: Yes    Alcohol/week: 0.6 oz    Types: 1 Glasses of wine per week    Comment: 0.6 oz per week  . Drug use: No  . Sexual activity: None  Other Topics Concern  . None  Social History Narrative  . None    Family History  Problem Relation Age of Onset  . Heart attack Maternal Grandfather        70s  . Emphysema Mother   . Hypertension Mother   . Alzheimer's disease Father   . Breast cancer Maternal Grandmother   . Cancer Maternal Grandmother        breast  . Diabetes Neg Hx   . Stroke Neg Hx   . Parkinson's disease Neg Hx     Review of Systems  Constitutional: Negative for chills and fever.  Respiratory: Negative for shortness of breath.        Left lower rib pain  Cardiovascular: Positive for leg swelling. Negative for chest pain and palpitations.  Skin: Positive for color change (redness in b/l LE).       Calluses bilateral feet, no open wounds       Objective:   Vitals:   12/21/17 1555  BP: (!) 154/76  Pulse: 64  Resp: 16  Temp: 98.3 F (36.8 C)  SpO2: 98%   Wt Readings from Last 3 Encounters:  12/21/17 86 lb 8 oz (39.2 kg)  12/02/17 88 lb (39.9 kg)  11/27/17 90 lb (40.8 kg)   Body mass index is 18.08 kg/m.   Physical Exam  Constitutional:  Elderly, frail female in no acute distress, non-diaphoretic  Musculoskeletal: She exhibits edema (1+ bilateral  lower extremity edema that is pitting; right foot mildly swollen, no left foot swelling).  Skin: There is erythema (Mild generalized erythema bilateral lower extremities right> left, slight warmth-chronic).  Calluses on dorsal aspect of right first toe, plantar aspect of right ball of foot; calluses on left plantar ball of foot that are tender; no open wound; deformed toes right foot/hammertoes           Assessment & Plan:    See Problem List for Assessment and Plan of chronic medical problems.

## 2017-12-21 ENCOUNTER — Encounter: Payer: Self-pay | Admitting: Internal Medicine

## 2017-12-21 ENCOUNTER — Ambulatory Visit: Payer: Medicare Other | Admitting: Internal Medicine

## 2017-12-21 VITALS — BP 154/76 | HR 64 | Temp 98.3°F | Resp 16 | Wt 86.5 lb

## 2017-12-21 DIAGNOSIS — M79672 Pain in left foot: Secondary | ICD-10-CM

## 2017-12-21 DIAGNOSIS — Z9181 History of falling: Secondary | ICD-10-CM | POA: Diagnosis not present

## 2017-12-21 DIAGNOSIS — M542 Cervicalgia: Secondary | ICD-10-CM | POA: Diagnosis not present

## 2017-12-21 DIAGNOSIS — G20 Parkinson's disease: Secondary | ICD-10-CM | POA: Diagnosis not present

## 2017-12-21 DIAGNOSIS — R600 Localized edema: Secondary | ICD-10-CM

## 2017-12-21 DIAGNOSIS — M79671 Pain in right foot: Secondary | ICD-10-CM

## 2017-12-21 DIAGNOSIS — D510 Vitamin B12 deficiency anemia due to intrinsic factor deficiency: Secondary | ICD-10-CM | POA: Diagnosis not present

## 2017-12-21 DIAGNOSIS — G2581 Restless legs syndrome: Secondary | ICD-10-CM | POA: Diagnosis not present

## 2017-12-21 DIAGNOSIS — M1991 Primary osteoarthritis, unspecified site: Secondary | ICD-10-CM | POA: Diagnosis not present

## 2017-12-21 DIAGNOSIS — D181 Lymphangioma, any site: Secondary | ICD-10-CM | POA: Diagnosis not present

## 2017-12-21 DIAGNOSIS — N390 Urinary tract infection, site not specified: Secondary | ICD-10-CM | POA: Diagnosis not present

## 2017-12-21 DIAGNOSIS — Z8673 Personal history of transient ischemic attack (TIA), and cerebral infarction without residual deficits: Secondary | ICD-10-CM | POA: Diagnosis not present

## 2017-12-21 NOTE — Assessment & Plan Note (Signed)
Will refer to podiatry for further evaluation and treatment Some of her pain is from the calluses and foot deformities.  Some of her right foot pain is also likely related to the foot swelling

## 2017-12-21 NOTE — Assessment & Plan Note (Signed)
Taking Lasix 20 mg daily Has echocardiogram scheduled for this week We will try taking 40 mg of Lasix on Wednesday and Friday to see if that gets off some of the extra fluid and then return to 20 mg daily Continue to elevate legs when sitting, low-sodium diet Will get compression hose and that may help get her off of the Lasix hopefully Call if no improvement

## 2017-12-21 NOTE — Patient Instructions (Signed)
   Medications reviewed and updated.  Changes include take two water pills on Wednesday and Friday.     A referral was ordered for podiatry.  They will call you to schedule an appointment.

## 2017-12-23 ENCOUNTER — Ambulatory Visit (HOSPITAL_COMMUNITY): Payer: Medicare Other | Attending: Internal Medicine

## 2017-12-23 ENCOUNTER — Other Ambulatory Visit: Payer: Self-pay

## 2017-12-23 DIAGNOSIS — E785 Hyperlipidemia, unspecified: Secondary | ICD-10-CM | POA: Diagnosis not present

## 2017-12-23 DIAGNOSIS — G2581 Restless legs syndrome: Secondary | ICD-10-CM | POA: Diagnosis not present

## 2017-12-23 DIAGNOSIS — Z8673 Personal history of transient ischemic attack (TIA), and cerebral infarction without residual deficits: Secondary | ICD-10-CM | POA: Insufficient documentation

## 2017-12-23 DIAGNOSIS — E039 Hypothyroidism, unspecified: Secondary | ICD-10-CM | POA: Insufficient documentation

## 2017-12-23 DIAGNOSIS — Z9181 History of falling: Secondary | ICD-10-CM | POA: Diagnosis not present

## 2017-12-23 DIAGNOSIS — M1991 Primary osteoarthritis, unspecified site: Secondary | ICD-10-CM | POA: Diagnosis not present

## 2017-12-23 DIAGNOSIS — M542 Cervicalgia: Secondary | ICD-10-CM | POA: Diagnosis not present

## 2017-12-23 DIAGNOSIS — I071 Rheumatic tricuspid insufficiency: Secondary | ICD-10-CM | POA: Diagnosis not present

## 2017-12-23 DIAGNOSIS — R600 Localized edema: Secondary | ICD-10-CM | POA: Diagnosis not present

## 2017-12-23 DIAGNOSIS — G459 Transient cerebral ischemic attack, unspecified: Secondary | ICD-10-CM | POA: Insufficient documentation

## 2017-12-23 DIAGNOSIS — D181 Lymphangioma, any site: Secondary | ICD-10-CM | POA: Diagnosis not present

## 2017-12-23 DIAGNOSIS — G20 Parkinson's disease: Secondary | ICD-10-CM | POA: Diagnosis not present

## 2017-12-23 DIAGNOSIS — D510 Vitamin B12 deficiency anemia due to intrinsic factor deficiency: Secondary | ICD-10-CM | POA: Diagnosis not present

## 2017-12-23 DIAGNOSIS — N390 Urinary tract infection, site not specified: Secondary | ICD-10-CM | POA: Diagnosis not present

## 2017-12-25 ENCOUNTER — Encounter: Payer: Self-pay | Admitting: Emergency Medicine

## 2017-12-29 ENCOUNTER — Ambulatory Visit: Payer: Medicare Other | Admitting: Neurology

## 2018-01-07 ENCOUNTER — Telehealth: Payer: Self-pay | Admitting: Internal Medicine

## 2018-01-07 NOTE — Telephone Encounter (Signed)
Copied from [place] [phone]. Topic: General - Other >> Jan 07, 2018  4:29 PM Darl Householder, RMA wrote: Reason for CRM: pt is requesting a letter from Dr. Quay Burow to the [facility] concerning her not be able to walk to her mailbox due to her parkinson disease and is requesting for a mailbox at her door.

## 2018-01-07 NOTE — Telephone Encounter (Signed)
Ok to write? 

## 2018-01-08 ENCOUNTER — Encounter: Payer: Self-pay | Admitting: Emergency Medicine

## 2018-01-08 NOTE — Telephone Encounter (Signed)
Letter written, spoke with pt, asked we mail it to her.

## 2018-01-20 ENCOUNTER — Ambulatory Visit (INDEPENDENT_AMBULATORY_CARE_PROVIDER_SITE_OTHER): Payer: Medicare Other

## 2018-01-20 ENCOUNTER — Ambulatory Visit: Payer: Medicare Other | Admitting: Podiatry

## 2018-01-20 DIAGNOSIS — L84 Corns and callosities: Secondary | ICD-10-CM | POA: Diagnosis not present

## 2018-01-20 DIAGNOSIS — M2010 Hallux valgus (acquired), unspecified foot: Secondary | ICD-10-CM

## 2018-01-20 DIAGNOSIS — M2040 Other hammer toe(s) (acquired), unspecified foot: Secondary | ICD-10-CM | POA: Diagnosis not present

## 2018-01-20 NOTE — Progress Notes (Signed)
Subjective:   Patient ID: Elizabeth [name], female   DOB: 84 y.o.   MRN: [id number]   HPI Patient presents with caregiver with severe lesion first hallux right underneath the right fourth metatarsal fifth metatarsal left with severe foot structural changes and poor health of the individual.  Patient does not smoke and is not active   Review of Systems  All other systems reviewed and are negative.       Objective:  Physical Exam  Constitutional: She appears well-developed and well-nourished.  Cardiovascular: Intact distal pulses.  Pulmonary/Chest: Effort normal.  Musculoskeletal: Normal range of motion.  Neurological: She is alert.  Skin: Skin is warm.  Nursing note and vitals reviewed.   Neurovascular status was diminished but intact bilateral severe foot structural deformities with severe lesion formation and diminishment of fat pad noted bilateral.  Patient has poor health and poor nutrition which is a complicating factor     Assessment:  Severe structural changes of the feet bilateral with lesion formation secondary to diminished fat pad and prominent metatarsal bones     Plan:  H&P x-rays reviewed and careful debridement accomplished with sharp sterile instrumentation.  No iatrogenic bleeding except for the right hallux were sterile dressing was applied instructions on soaks given.  Patient will use wider shoes and will be seen back as needed  X-ray indicates severe structural malalignment of both feet with osteoporosis

## 2018-02-02 DIAGNOSIS — Z961 Presence of intraocular lens: Secondary | ICD-10-CM | POA: Diagnosis not present

## 2018-02-02 DIAGNOSIS — H532 Diplopia: Secondary | ICD-10-CM | POA: Diagnosis not present

## 2018-02-28 NOTE — Progress Notes (Signed)
Subjective:    Patient ID: Elizabeth [name], female    DOB: November 11, 1931, 84 y.o.   MRN: [id number]  HPI The patient is here for follow up.  Double vision:  She has been seeing double out of her left eye when she looks up.  This started in January. She saw her ophthalmologist last month.  He felt this was related parkinson's and advised she discuss it with her neurologist.    Hypothyroidism:  She is taking her medication daily.  She denies any recent changes in energy or weight that are unexplained.   B/l leg edema:  She is not taking any water pill now.  She elevates the legs when sitting.  She thinks they are a little better, but are at a stand still now - no improvement in recently.     Anxiety: She is taking her medication daily as prescribed. She denies any side effects from the medication. She feels her anxiety is well controlled and she is happy with her current dose of medication.   Urinary frequency:  It has been increased over the past two days.  She denies dysuria and hematuria.  She is worried about an infection.    Medications and allergies reviewed with patient and updated if appropriate.  Patient Active Problem List   Diagnosis Date Noted  . Foot pain, bilateral 12/21/2017  . Bilateral leg edema 12/02/2017  . Klebsiella cystitis 09/19/2017  . Overactive bladder 09/19/2017  . Pernicious anemia   . Post-polio syndrome   . Vitamin B12 deficiency   . Fall 09/11/2017  . Subdural hygroma 09/11/2017  . Weakness 06/24/2017  . UTI (urinary tract infection) 06/24/2017  . Swallowing disorder 03/04/2017  . Parkinson disease ([place]) 06/25/2016  . Restless leg syndrome 12/25/2015  . Arthritis 12/25/2015  . Abnormality of gait 05/24/2015  . Intention tremor 12/08/2013  . History of CVA (cerebrovascular accident) 07/20/2012  . Cervicalgia 01/17/2009  . Irritable bowel syndrome (IBS) 01/17/2009  . Urinary incontinence 09/07/2008  . ANEMIA, B12 DEFICIENCY 04/26/2008  .  HYPERLIPIDEMIA 01/26/2008  . History of TIA (transient ischemic attack) 01/26/2008  . Hypothyroidism 01/04/2008  . Osteopenia 01/04/2008  . Abnormal Pap smear of vagina 09/24/2006    Current Outpatient Medications on File Prior to Visit  Medication Sig Dispense Refill  . AMBULATORY NON FORMULARY MEDICATION Lift Chair DX: G20 1 Device 0  . aspirin 81 MG tablet Take 81 mg by mouth daily.      . Calcium Carbonate-Vitamin D (CALCIUM + D PO) Take 1 tablet by mouth daily. Reported on 12/25/2015    . Carbidopa-Levodopa ER (RYTARY) 36.25-145 MG CPCR Take 1 tablet by mouth 3 (three) times daily. 90 capsule 3  . cholecalciferol (VITAMIN D) 1000 UNITS tablet Take 1,000 Units by mouth daily.      . furosemide (LASIX) 20 MG tablet Take 1 tablet (20 mg total) by mouth daily. 10 tablet 0  . levothyroxine (SYNTHROID, LEVOTHROID) 75 MCG tablet Take 0.5 tablets (37.5 mcg total) by mouth daily before breakfast.    . nitrofurantoin, macrocrystal-monohydrate, (MACROBID) 100 MG capsule     . pramipexole (MIRAPEX) 0.125 MG tablet Take 1 tablet (0.125 mg total) by mouth at bedtime. 30 tablet 3  . sertraline (ZOLOFT) 25 MG tablet Take 1 tablet (25 mg total) by mouth at bedtime. 30 tablet 5  . Simethicone (GAS-X EXTRA STRENGTH) 125 MG CAPS Take 125 mg by mouth daily as needed (gas).    . tolterodine (DETROL LA) 4 MG  24 hr capsule Take 4 mg by mouth daily.     . vitamin B-12 (CYANOCOBALAMIN) 1000 MCG tablet Take 1 tablet (1,000 mcg total) by mouth daily. 90 tablet 3   No current facility-administered medications on file prior to visit.     Past Medical History:  Diagnosis Date  . Abnormal Pap smear of vagina 09/2006   Dr Nori Riis  . Abnormality of gait 05/24/2015   PMH R occipital infarct B12 deficiency H/o polio affected left leg  In Physical Therapy to improve balance & strength through [facility]  . Arthritis 12/25/2015  . Cervicalgia 01/17/2009   MRI 03/27/16:  IMPRESSION: 1. At C3-4 there is a mild broad-based  disc bulge. Moderate bilateral facet arthropathy and uncovertebral degenerative changes resulting in moderate bilateral foraminal stenosis. 2. At C5-6 there is a broad-based disc osteophyte complex. Bilateral uncovertebral degenerative changes and facet arthropathy resulting in moderate bilateral foraminal stenosis.  . Fall 09/11/2017  . History of CVA (cerebrovascular accident) 07/20/2012   Chronic R occipital infarct ; stable on CT 05/21/2012 .Dr Jannifer Franklin , Neurology   . History of TIA (transient ischemic attack) 01/26/2008  . HYPERLIPIDEMIA 01/26/2008   NMR Lipoprofile 2009: LDL 135 (1704/911), HDL 62, TG 131. LDL goal = < 100; ideally < 70. MGF MI in 53s No FH CVA  . Hypothyroidism   . Intention tremor 12/08/2013   Onset after fall 04/2012   . Irritable bowel syndrome (IBS) 01/17/2009   Dr. Unice Cobble  . Osteopenia    PMH fracture heel, wrist  . Parkinson disease ([place]) 06/25/2016   Dr Tat  . Pernicious anemia   . Post-polio syndrome    minor problems L leg  . Restless leg syndrome 12/25/2015   Dr Tat  . Subdural hygroma 09/11/2017  . Swallowing disorder 03/04/2017  . Urinary incontinence 09/07/2008   Dr McDiarmid, Urology   . Vitamin B12 deficiency   . Weakness 06/24/2017    Past Surgical History:  Procedure Laterality Date  . BLADDER SUSPENSION  2000  . CATARACT EXTRACTION, BILATERAL     Dr. Gershon Crane  . no colonoscopy     "I never felt I needed one "  . YAG LASER APPLICATION Left    Dr. Gershon Crane    Social History   Socioeconomic History  . Marital status: Single    Spouse name: Not on file  . Number of children: Not on file  . Years of education: Not on file  . Highest education level: Not on file  Occupational History  . Not on file  Social Needs  . Financial resource strain: Not on file  . Food insecurity:    Worry: Not on file    Inability: Not on file  . Transportation needs:    Medical: Not on file    Non-medical: Not on file  Tobacco Use  . Smoking status:  Never Smoker  . Smokeless tobacco: Never Used  Substance and Sexual Activity  . Alcohol use: Yes    Alcohol/week: 0.6 oz    Types: 1 Glasses of wine per week    Comment: 0.6 oz per week  . Drug use: No  . Sexual activity: Not on file  Lifestyle  . Physical activity:    Days per week: Not on file    Minutes per session: Not on file  . Stress: Not on file  Relationships  . Social connections:    Talks on phone: Not on file    Gets together: Not on file  Attends religious service: Not on file    Active member of club or organization: Not on file    Attends meetings of clubs or organizations: Not on file    Relationship status: Not on file  Other Topics Concern  . Not on file  Social History Narrative  . Not on file    Family History  Problem Relation Age of Onset  . Heart attack Maternal Grandfather        70s  . Emphysema Mother   . Hypertension Mother   . Alzheimer's disease Father   . Breast cancer Maternal Grandmother   . Cancer Maternal Grandmother        breast  . Diabetes Neg Hx   . Stroke Neg Hx   . Parkinson's disease Neg Hx     Review of Systems  Constitutional: Negative for appetite change and fever.  HENT: Positive for postnasal drip.   Eyes: Positive for visual disturbance (double vision - left eye when looking up).  Respiratory: Negative for cough, shortness of breath and wheezing.   Cardiovascular: Positive for leg swelling. Negative for chest pain and palpitations.  Gastrointestinal: Negative for abdominal pain and nausea.  Genitourinary: Positive for frequency and urgency. Negative for dysuria and hematuria.  Neurological: Negative for light-headedness and headaches.       Objective:   Vitals:   03/02/18 1357  BP: 138/76  Pulse: 83  Resp: 16  Temp: 97.8 F (36.6 C)  SpO2: 95%   BP Readings from Last 3 Encounters:  03/02/18 138/76  12/21/17 (!) 154/76  12/02/17 (!) 148/84   Wt Readings from Last 3 Encounters:  03/02/18 89 lb 4 oz  (40.5 kg)  12/21/17 86 lb 8 oz (39.2 kg)  12/02/17 88 lb (39.9 kg)   Body mass index is 18.65 kg/m.   Physical Exam    Constitutional: Appears well-developed and well-nourished. No distress.  HENT:  Head: Normocephalic and atraumatic.  Neck: Neck supple. No tracheal deviation present. No thyromegaly present.  No cervical lymphadenopathy Cardiovascular: Normal rate, regular rhythm and normal heart sounds.   No murmur heard. No carotid bruit .  2 + pitting edema b/l LE up to mid upper legs Pulmonary/Chest: Effort normal and breath sounds normal. No respiratory distress. No has no wheezes. No rales.  Skin: Skin is warm and dry. Not diaphoretic.  Psychiatric: Normal mood and affect. Behavior is normal.      Assessment & Plan:    See Problem List for Assessment and Plan of chronic medical problems.

## 2018-03-01 ENCOUNTER — Telehealth: Payer: Self-pay | Admitting: Emergency Medicine

## 2018-03-01 NOTE — Telephone Encounter (Signed)
Left message in appt notes for patient to schedule AWV.

## 2018-03-02 ENCOUNTER — Encounter: Payer: Self-pay | Admitting: Internal Medicine

## 2018-03-02 ENCOUNTER — Telehealth: Payer: Self-pay | Admitting: Emergency Medicine

## 2018-03-02 ENCOUNTER — Encounter: Payer: Self-pay | Admitting: Emergency Medicine

## 2018-03-02 ENCOUNTER — Ambulatory Visit: Payer: Medicare Other | Admitting: Internal Medicine

## 2018-03-02 ENCOUNTER — Other Ambulatory Visit (INDEPENDENT_AMBULATORY_CARE_PROVIDER_SITE_OTHER): Payer: Medicare Other

## 2018-03-02 ENCOUNTER — Other Ambulatory Visit: Payer: Medicare Other

## 2018-03-02 VITALS — BP 138/76 | HR 83 | Temp 97.8°F | Resp 16 | Wt 89.2 lb

## 2018-03-02 DIAGNOSIS — E538 Deficiency of other specified B group vitamins: Secondary | ICD-10-CM | POA: Diagnosis not present

## 2018-03-02 DIAGNOSIS — F419 Anxiety disorder, unspecified: Secondary | ICD-10-CM | POA: Diagnosis not present

## 2018-03-02 DIAGNOSIS — R600 Localized edema: Secondary | ICD-10-CM

## 2018-03-02 DIAGNOSIS — R350 Frequency of micturition: Secondary | ICD-10-CM

## 2018-03-02 DIAGNOSIS — E039 Hypothyroidism, unspecified: Secondary | ICD-10-CM | POA: Diagnosis not present

## 2018-03-02 LAB — POCT URINALYSIS DIPSTICK
Bilirubin, UA: NEGATIVE
Blood, UA: NEGATIVE
Glucose, UA: NEGATIVE
KETONES UA: NEGATIVE
LEUKOCYTES UA: NEGATIVE
NITRITE UA: NEGATIVE
PH UA: 6 (ref 5.0–8.0)
SPEC GRAV UA: 1.015 (ref 1.010–1.025)
UROBILINOGEN UA: 0.2 U/dL

## 2018-03-02 LAB — COMPREHENSIVE METABOLIC PANEL
ALT: 3 U/L (ref 0–35)
AST: 30 U/L (ref 0–37)
Albumin: 3.5 g/dL (ref 3.5–5.2)
Alkaline Phosphatase: 92 U/L (ref 39–117)
BUN: 11 mg/dL (ref 6–23)
CHLORIDE: 103 meq/L (ref 96–112)
CO2: 30 meq/L (ref 19–32)
CREATININE: 0.69 mg/dL (ref 0.40–1.20)
Calcium: 8.7 mg/dL (ref 8.4–10.5)
GFR: 85.93 mL/min (ref >60.00)
GLUCOSE: 93 mg/dL (ref 70–99)
Potassium: 3.4 mEq/L — ABNORMAL LOW (ref 3.5–5.1)
SODIUM: 139 meq/L (ref 135–145)
Total Bilirubin: 0.9 mg/dL (ref 0.2–1.2)
Total Protein: 6.5 g/dL (ref 6.0–8.3)

## 2018-03-02 LAB — CBC WITH DIFFERENTIAL/PLATELET
BASOS ABS: 0 10*3/uL (ref 0.0–0.1)
Basophils Relative: 1 % (ref 0.0–3.0)
Eosinophils Absolute: 0.1 10*3/uL (ref 0.0–0.7)
Eosinophils Relative: 2.8 % (ref 0.0–5.0)
HCT: 31.9 % — ABNORMAL LOW (ref 36.0–46.0)
HEMOGLOBIN: 10.8 g/dL — AB (ref 12.0–15.0)
LYMPHS ABS: 1.4 10*3/uL (ref 0.7–4.0)
Lymphocytes Relative: 29.6 % (ref 12.0–46.0)
MCHC: 33.9 g/dL (ref 30.0–36.0)
MCV: 83 fl (ref 78.0–100.0)
MONO ABS: 0.4 10*3/uL (ref 0.1–1.0)
Monocytes Relative: 8.9 % (ref 3.0–12.0)
NEUTROS PCT: 57.7 % (ref 43.0–77.0)
Neutro Abs: 2.7 10*3/uL (ref 1.4–7.7)
Platelets: 246 10*3/uL (ref 150.0–400.0)
RBC: 3.85 Mil/uL — AB (ref 3.87–5.11)
RDW: 17.1 % — ABNORMAL HIGH (ref 11.5–15.5)
WBC: 4.7 10*3/uL (ref 4.0–10.5)

## 2018-03-02 LAB — VITAMIN B12: VITAMIN B 12: 373 pg/mL (ref 211–911)

## 2018-03-02 LAB — TSH: TSH: 13.63 u[IU]/mL — AB (ref 0.35–4.50)

## 2018-03-02 MED ORDER — FUROSEMIDE 20 MG PO TABS: 10.0000 mg | ORAL_TABLET | Freq: Every day | ORAL | 2 refills | Status: DC

## 2018-03-02 MED ORDER — LEVOTHYROXINE SODIUM 75 MCG PO TABS: 75.0000 ug | ORAL_TABLET | Freq: Every day | ORAL | Status: DC

## 2018-03-02 NOTE — Assessment & Plan Note (Signed)
Controlled, stable Continue current dose of medication  

## 2018-03-02 NOTE — Assessment & Plan Note (Signed)
Taking b12 Check level 

## 2018-03-02 NOTE — Assessment & Plan Note (Signed)
Ua, Ucx to r/o infection

## 2018-03-02 NOTE — Assessment & Plan Note (Signed)
Taking a whole pill daily ( 75 mcg daily) Check tsh Will titrate dose if needed

## 2018-03-02 NOTE — Telephone Encounter (Signed)
Patient declined AWV 

## 2018-03-02 NOTE — Assessment & Plan Note (Signed)
Leg edema up to mid upper legs Start lasix 10 mg daily Elevate legs more consistently cmp today F/u in 3 months

## 2018-03-02 NOTE — Patient Instructions (Addendum)
  Test(s) ordered today. Your results will be released to [place] (or called to you) after review, usually within 72hours after test completion. If any changes need to be made, you will be notified at that same time.   Medications reviewed and updated.  Changes include starting lasix 10 mg daily for your   Your prescription(s) have been submitted to your pharmacy. Please take as directed and contact our office if you believe you are having problem(s) with the medication(s).   Please followup in 3 months

## 2018-03-03 ENCOUNTER — Encounter: Payer: Self-pay | Admitting: Internal Medicine

## 2018-03-03 ENCOUNTER — Other Ambulatory Visit: Payer: Self-pay | Admitting: Internal Medicine

## 2018-03-03 DIAGNOSIS — D649 Anemia, unspecified: Secondary | ICD-10-CM

## 2018-03-03 DIAGNOSIS — E039 Hypothyroidism, unspecified: Secondary | ICD-10-CM

## 2018-03-03 MED ORDER — LEVOTHYROXINE SODIUM 100 MCG PO TABS
100.0000 ug | ORAL_TABLET | Freq: Every day | ORAL | 3 refills | Status: DC
Start: 2018-03-03 — End: 2018-04-30

## 2018-03-04 LAB — URINE CULTURE
MICRO NUMBER:: 90436255
SPECIMEN QUALITY: ADEQUATE

## 2018-03-05 ENCOUNTER — Telehealth: Payer: Self-pay | Admitting: Internal Medicine

## 2018-03-05 NOTE — Telephone Encounter (Signed)
Her urine shows a possible infection, but this is also a bacteria that can be in the stool that does not need to be treated.    Please ask her what symptoms she is having.  If she is not having any symptoms we should just want monitor for now and not treat.  Ideally it is better to avoid an antibiotic.    If she is symptomatic I will send an antibiotic to her pharmacy.

## 2018-03-05 NOTE — Telephone Encounter (Signed)
Pt advised of message below. Pt understood and will call back if sxs get worse.

## 2018-03-11 NOTE — Progress Notes (Signed)
Elizabeth [name] was seen today in the movement disorders clinic for neurologic consultation at the request of Burns, Claudina Lick, MD.  The consultation is for the evaluation of tremor and to r/o PD.  The tremor is in the L hand and is noted if she picks up something.  She is right hand dominant.  She has noted tremor for max of 2 years.  It really has not changed with time.  No family hx of tremor.  Denies leg tremor or head tremor.     Specific Symptoms:  Tremor: Yes.     Affected by caffeine:  No. (1 cup coffee per day)  Affected by alcohol:  No. (only drinks at christmas)  Affected by stress:  Yes.    Affected by fatigue:  Yes.    Spills soup if on spoon:  No. (involves non dominant hand)  Spills glass of liquid if full:  No. (involves non dominant hand)  Affects ADL's (tying shoes, brushing teeth, etc):  No.   By far her biggest c/o is inability to sit comfortably because of the need to move the legs when sitting.  "If I don't move them, I feel like I will scream."  It generally happens when she tries to watch TV.  She was told by her dermatologist that it was due to venous insufficiency.  If she gets up and moves around the sx's are better.  She can sit and play bridge and scrabble for hours without problem but cannot watch TV without getting up and moving around  Any other sx's:   Voice: no change Sleep: trouble getting asleep because legs are not comfortable; "part of it may be my nervous"  Vivid Dreams:  Yes.  , "I feel like my mother is in the room with me and she is deceased"  Acting out dreams:  No., but "I live by myself" Wet Pillows: No. Postural symptoms:  Yes.    Falls?  Yes.  ; went to ED on 12/13/14 after a fall between the commode and the wall and she hit her head.  She drove herself to the ED.  States that she had a similar fall in 2013. Bradykinesia symptoms: difficulty getting out of a chair Loss of smell:  No. Loss of taste:  No. Urinary Incontinence:  Yes.  , bladder  suspension 20 years ago but gotten worse again (on myrbetriq) Difficulty Swallowing:  No. Handwriting, micrographia: No. Trouble buttoning clothing: Yes.   Depression:  Yes.   (worse since retirement) Memory changes:  No. Hallucinations:  No.  visual distortions: No. N/V:  No. Lightheaded:  No.  Syncope: No. Diplopia:  No. Dyskinesia:  No.   09/13/15 update:  The patient returns today for follow-up.  Last visit, I started her on low-dose Mirapex, 0.125 mg for her restless leg. Pt states that the medication really helped and "I need it for the rest of my days."   She had some blood work and she was anemic with a hemoglobin of 11.  I had intended to have her iron stores/TIBC drawn but for some reason that was not done.  Neuroimaging has previously been performed.  It is available for my review today.  Had a CT brain in the ED in Jan 2016 after a fall.  There was an old infact in the R occipito-parietal region and L caudate region; there was small vessel disease and atrophy.  03/13/16 update:  The patient is following up today and has a history  of restless leg syndrome.  She is on Mirapex, 0.125 mg.  She was slightly anemic when I checked her hemoglobin last October (1.6) and her primary care physician rechecked this in February and her hemoglobin was 11.0.  Overall, this has been stable for quite some time.  Her iron stores and iron saturation were good in October.  With the medication, she has been sleeping well.  Her biggest c/o is neck pain x several months.  She saw Dr. Noemi Chapel and states that she had a cortisone injection but that was in the right shoulder.  Sometimes, the pain radiates in the neck.  X rays were done of the cervical spine but no other neuroimaging.  No PT for the neck.     04/24/16 update:  The patient is following up today.  Last visit, she looked more parkinsonian than previous visits, so I decided to start her on a trial of carbidopa/levodopa 25/100, one tablet 3 times per day.   Pt states this definitely helped.   She is also on Mirapex 0.125 mg at night.  She states that it is helping but is having some sx during the day and it drives her nuts; the last time she played bingo, she had to keep getting up.  She denies any falls since last visit.  No lightheadedness or near syncope.  No hallucinations.  She did have an MRI of the cervical spine that demonstrated a disc protrusion at C5-C6 level.  There was bilateral moderate neural foraminal stenosis at the C3/C4 level and C5/C6 level.  She was referred to physical therapy.  She has an appt upcoming.  States that she has had "neck popping" but no significant pain.  Asks me for a neck brace because she feels that she is humping over some and has trouble keeping neck up.    09/18/16 update:  The patient follows up today, on carbidopa/levodopa 25/100, one tablet 3 times per day.  She is also on pramipexole, 0.125 mg twice a day for restless leg syndrome.  She denies any cognitive change.  She denies compulsive behaviors.  She denies sleep attacks.  She denies falls.  She denies lightheadedness or near syncope.  No hallucinations.  He has been attending physical therapy for cervicalgia since our last visit.  It helped that and her balance and strength.  She states "I need more."  I have reviewed those records.  01/08/17 update:   Patient follows up today.  The patient is on carbidopa/levodopa, one tablet 3 times per day.  She is having trouble swallowing this and "I about gag."  Asks me if there is something else she can try.   She is also on pramipexole 0.125 mg twice per day for restless leg.  Overall, the patient states that she has been doing well since our last visit.  She denies lightheadedness or near syncope.  No hallucinations.  No cognitive change.  No falls.  11/10/17 update: Patient seen in follow-up today for her Parkinson's disease. She is accompanied by her nephew who supplements the history.   I have not seen her since  February as she has canceled or no showed multiple appointments since last visit.  Last visit, I tried to change her from immediate release carbidopa/levodopa 25/100 to rytary 145 mg 3 times per day.  This is primarily because of swallowing issues with immediate release medication.  I have not heard from her since I did this.  I have reviewed an extensive number of records since  her last visit.  She was in the emergency room in July because of a fall when she was in the bathroom.  It turns out that the patient had a urinary tract infection.  It was also revealed that the patient was only taking her rytary once per day.  Home health went to her house and the patient was refusing her medication.  On June 29, 2017, a fall was noted in her medical records.  The patient went to the emergency room on September 11, 2017 after a fall and required sutures.  She was admitted on that day because of frequent falls.  CT revealed a small left-sided subdural hygroma, but notes indicated that neurosurgery felt that this was stable.  Patient admitted that she was not taking her rytary.  She was discharged to [facility] for skilled rehab and just recently discharged home.  The only neurologic medication that she was on then was pramipexole, 0.125 mg twice per day and that is for restless leg.  She reports today that "I thought that I was taking my levodopa."  She admits that it may be once per day.  Also admits that she is still using the sample bottle I gave her a year ago.  Does state that rytary is easier to swallow than carbidopa/levodopa IR.   Lives alone.  Has someone come in on fridays to assist with bathing.  Had someone come into the home to lower the phone and coat closet hangers.  Has trouble getting out of the chair.  03/16/18 update: Patient seen today in follow-up for Parkinson's disease.  She is accompanied by her nephew who supplements the history.  Last visit, encouraged her to take her Rytary 145 mg, 1 tablet 3  times per day.  She asks about inbrija.  We decreased her pramipexole 0.125 mg twice per day to 1 tablet/day at bedtime for restless leg.   This worked well. Also discussed her living/home situation.  The records that were made available to me were reviewed.  Last saw PCP on 03/02/18.  Also saw Dr. Gershon Crane since our last visit.  She complained to him about diplopia.  It is reported that there were no intraocular issues and was told to follow-up here.  She states that it is only when she looks up and is only the left eye (she thinks).  She thinks that it is binocular.  Lasts until she stops looking up. No falls.  She is doing exercises that her PT left her to do.  She is using a hot compress on her neck and it is helping her pain.    ALLERGIES:   Allergies  Allergen Reactions  . Atorvastatin     REACTION: Felt funny (only way patient could describe)  . Crestor [Rosuvastatin Calcium]     Leg cramps, muscle aches  . Vesicare [Solifenacin]     constipation    CURRENT MEDICATIONS:  Outpatient Encounter Medications as of 03/16/2018  Medication Sig  . aspirin 81 MG tablet Take 81 mg by mouth daily.    . Calcium Carbonate-Vitamin D (CALCIUM + D PO) Take 1 tablet by mouth daily. Reported on 12/25/2015  . Carbidopa-Levodopa ER (RYTARY) 36.25-145 MG CPCR Take 1 tablet by mouth 3 (three) times daily.  . cholecalciferol (VITAMIN D) 1000 UNITS tablet Take 1,000 Units by mouth daily.    Marland Kitchen levothyroxine (SYNTHROID, LEVOTHROID) 100 MCG tablet Take 1 tablet (100 mcg total) by mouth daily.  . pramipexole (MIRAPEX) 0.125 MG tablet Take 1  tablet (0.125 mg total) by mouth at bedtime.  . Simethicone (GAS-X EXTRA STRENGTH) 125 MG CAPS Take 125 mg by mouth daily as needed (gas).  . vitamin B-12 (CYANOCOBALAMIN) 1000 MCG tablet Take 1 tablet (1,000 mcg total) by mouth daily.  . [DISCONTINUED] AMBULATORY NON FORMULARY MEDICATION Lift Chair DX: G20  . [DISCONTINUED] furosemide (LASIX) 20 MG tablet Take 0.5 tablets (10 mg  total) by mouth daily.  . [DISCONTINUED] sertraline (ZOLOFT) 25 MG tablet Take 1 tablet (25 mg total) by mouth at bedtime.  . [DISCONTINUED] tolterodine (DETROL LA) 4 MG 24 hr capsule Take 4 mg by mouth daily.    No facility-administered encounter medications on file as of 03/16/2018.     PAST MEDICAL HISTORY:   Past Medical History:  Diagnosis Date  . Abnormal Pap smear of vagina 09/2006   Dr Nori Riis  . Abnormality of gait 05/24/2015   PMH R occipital infarct B12 deficiency H/o polio affected left leg  In Physical Therapy to improve balance & strength through [facility]  . Arthritis 12/25/2015  . Cervicalgia 01/17/2009   MRI 03/27/16:  IMPRESSION: 1. At C3-4 there is a mild broad-based disc bulge. Moderate bilateral facet arthropathy and uncovertebral degenerative changes resulting in moderate bilateral foraminal stenosis. 2. At C5-6 there is a broad-based disc osteophyte complex. Bilateral uncovertebral degenerative changes and facet arthropathy resulting in moderate bilateral foraminal stenosis.  . Fall 09/11/2017  . History of CVA (cerebrovascular accident) 07/20/2012   Chronic R occipital infarct ; stable on CT 05/21/2012 .Dr Jannifer Franklin , Neurology   . History of TIA (transient ischemic attack) 01/26/2008  . HYPERLIPIDEMIA 01/26/2008   NMR Lipoprofile 2009: LDL 135 (1704/911), HDL 62, TG 131. LDL goal = < 100; ideally < 70. MGF MI in 67s No FH CVA  . Hypothyroidism   . Intention tremor 12/08/2013   Onset after fall 04/2012   . Irritable bowel syndrome (IBS) 01/17/2009   Dr. Unice Cobble  . Osteopenia    PMH fracture heel, wrist  . Parkinson disease ([place]) 06/25/2016   Dr Kaedyn Polivka  . Pernicious anemia   . Post-polio syndrome    minor problems L leg  . Restless leg syndrome 12/25/2015   Dr Zakyah Yanes  . Subdural hygroma 09/11/2017  . Swallowing disorder 03/04/2017  . Urinary incontinence 09/07/2008   Dr McDiarmid, Urology   . Vitamin B12 deficiency   . Weakness 06/24/2017    PAST SURGICAL HISTORY:     Past Surgical History:  Procedure Laterality Date  . BLADDER SUSPENSION  2000  . CATARACT EXTRACTION, BILATERAL     Dr. Gershon Crane  . no colonoscopy     "I never felt I needed one "  . YAG LASER APPLICATION Left    Dr. Gershon Crane    SOCIAL HISTORY:   Social History   Socioeconomic History  . Marital status: Single    Spouse name: Not on file  . Number of children: Not on file  . Years of education: Not on file  . Highest education level: Not on file  Occupational History  . Not on file  Social Needs  . Financial resource strain: Not on file  . Food insecurity:    Worry: Not on file    Inability: Not on file  . Transportation needs:    Medical: Not on file    Non-medical: Not on file  Tobacco Use  . Smoking status: Never Smoker  . Smokeless tobacco: Never Used  Substance and Sexual Activity  . Alcohol use:  Yes    Alcohol/week: 0.6 oz    Types: 1 Glasses of wine per week    Comment: 0.6 oz per week  . Drug use: No  . Sexual activity: Not on file  Lifestyle  . Physical activity:    Days per week: Not on file    Minutes per session: Not on file  . Stress: Not on file  Relationships  . Social connections:    Talks on phone: Not on file    Gets together: Not on file    Attends religious service: Not on file    Active member of club or organization: Not on file    Attends meetings of clubs or organizations: Not on file    Relationship status: Not on file  . Intimate partner violence:    Fear of current or ex partner: Not on file    Emotionally abused: Not on file    Physically abused: Not on file    Forced sexual activity: Not on file  Other Topics Concern  . Not on file  Social History Narrative  . Not on file    FAMILY HISTORY:   Family Status  Relation Name Status  . MGF  Deceased at age 91  . Mother  Deceased       emphysema, HTN  . Father  Deceased       alzheimer's  . Brother  Deceased       drown  . MGM  (Not Specified)  . Neg Hx  (Not Specified)     ROS:  A complete 10 system review of systems was obtained and was unremarkable apart from what is mentioned above.  PHYSICAL EXAMINATION:    VITALS:   Vitals:   03/16/18 1530  BP: 90/60  Pulse: 84  SpO2: 96%  Weight: 86 lb (39 kg)  Height: 4\' 10"  (1.473 m)    GEN:  The patient appears stated age and is in NAD. HEENT:  Normocephalic, atraumatic.  The mucous membranes are moist. The superficial temporal arteries are without ropiness or tenderness. CV:  RRR Lungs:  CTAB Neck/HEME:  There are no carotid bruits bilaterally.  The neck is flexed.  The chin is on the chest.  Neurological examination:  Orientation:  Montreal Cognitive Assessment  11/10/2017  Visuospatial/ Executive (0/5) 0  Naming (0/3) 3  Attention: Read list of digits (0/2) 2  Attention: Read list of letters (0/1) 1  Attention: Serial 7 subtraction starting at 100 (0/3) 2  Language: Repeat phrase (0/2) 2  Language : Fluency (0/1) 1  Abstraction (0/2) 1  Delayed Recall (0/5) 3  Orientation (0/6) 6  Total 21  Adjusted Score (based on education) 21   Cranial nerves: There is good facial symmetry. There is facial hypomimia. The visual fields are full to confrontational testing. The speech is fluent and clear but hypophonic. Soft palate rises symmetrically and there is no tongue deviation. Hearing is intact to conversational tone. Sensation: Sensation is intact to light touch throughout Motor: Strength is at least antigravity x 4   Movement examination: Tone: There is mild increased tone in the LUE Abnormal movements:   there is mild left upper extremity resting tremor. Coordination:  There is some difficulty with hand opening and closing bilaterally and finger taps more on the L. Gait and Station: The patient pushes off of the chair.  She is short stepped.  She hesitates in the doorway.  She is unstable (she reports some of this is because of painful  callus and is f/u with podiatry about that)  LABS  Lab  Results  Component Value Date   TSH 13.63 (H) 03/02/2018     Chemistry      Component Value Date/Time   NA 139 03/02/2018 1430   K 3.4 (L) 03/02/2018 1430   CL 103 03/02/2018 1430   CO2 30 03/02/2018 1430   BUN 11 03/02/2018 1430   CREATININE 0.69 03/02/2018 1430      Component Value Date/Time   CALCIUM 8.7 03/02/2018 1430   ALKPHOS 92 03/02/2018 1430   AST 30 03/02/2018 1430   ALT 3 03/02/2018 1430   BILITOT 0.9 03/02/2018 1430     Lab Results  Component Value Date   WBC 4.7 03/02/2018   HGB 10.8 (L) 03/02/2018   HCT 31.9 (L) 03/02/2018   MCV 83.0 03/02/2018   PLT 246.0 03/02/2018   Lab Results  Component Value Date   IRON 61 09/18/2016   TIBC 281 09/18/2016   FERRITIN 98 09/18/2016     Chemistry      Component Value Date/Time   NA 139 03/02/2018 1430   K 3.4 (L) 03/02/2018 1430   CL 103 03/02/2018 1430   CO2 30 03/02/2018 1430   BUN 11 03/02/2018 1430   CREATININE 0.69 03/02/2018 1430      Component Value Date/Time   CALCIUM 8.7 03/02/2018 1430   ALKPHOS 92 03/02/2018 1430   AST 30 03/02/2018 1430   ALT 3 03/02/2018 1430   BILITOT 0.9 03/02/2018 1430       ASSESSMENT/PLAN:  1.  Parkinsons disease  -She has had multiple falls since our last visit.  This is primarily due to noncompliance, both with follow-up visits as well as medication.  She had stopped all levodopa.  She and I talked about the importance of compliance.  We talked about the risks of falls and the morbidity and mortality associated with these.  She had trouble swallowing immediate release levodopa, so we had previously switched her to Rytary, which she did not take properly.  she is now taking rytary 145 mg tid.  She needs a higher dosage.  Will increase to rytary 145mg , 2/2/1.    -still think that she should not be living alone  -asks about inbrija.  Discussed details of this but don't think that she needs this  -she asks about referral for PT at home.  [facility].     2.  Restless leg syndrome  -on low dose pramipexole 0.125 mg q hs  -anemia may be contributing to this as well 3.  Follow up is anticipated in the next 5 months, sooner should new neurologic issues arise.  Much greater than 50% of this visit was spent in counseling and coordinating care.  Total face to face time:  25 min

## 2018-03-16 ENCOUNTER — Encounter: Payer: Self-pay | Admitting: Neurology

## 2018-03-16 ENCOUNTER — Ambulatory Visit: Payer: Medicare Other | Admitting: Neurology

## 2018-03-16 VITALS — BP 90/60 | HR 84 | Ht <= 58 in | Wt 86.0 lb

## 2018-03-16 DIAGNOSIS — G2581 Restless legs syndrome: Secondary | ICD-10-CM

## 2018-03-16 DIAGNOSIS — G20 Parkinson's disease: Secondary | ICD-10-CM

## 2018-03-16 NOTE — Patient Instructions (Addendum)
Increase rytary 145 mg, so that you take 2 capsules in the AM, 2 at noon and 1 in the evening     [facility] Parkinson's [facility] Movement [facility]  The [facility] team know that living well with a movement disorder extends far beyond our clinic walls. We are together with you. Our team is passionate about providing resources to you and your loved ones who are living with Parkinson's disease and movement disorders. Participate in these programs and join our community. These resources are free or low cost!   [facility] is adding:   Innovative educational programs for patients and caregivers.   Support groups for patients and caregivers living with Parkinson's disease.   Parkinson's specific exercise programs.   Custom tailored therapeutic programs that will benefit patient's living with Parkinson's disease.   We are in this together. You can help and contribute to grow these programs and resources in our community. 100% of the funds donated to the [facility] stays right here in our community to support patients and their caregivers.  To make a tax deductible contribution:  -ask for a [facility] Parkinson's envelope in the office today.  - call the Office of Institutional Advancement at [phone].

## 2018-03-17 ENCOUNTER — Telehealth: Payer: Self-pay | Admitting: Neurology

## 2018-03-17 NOTE — Telephone Encounter (Signed)
Referral faxed to [facility] for PT/OT at [phone] with confirmation received.

## 2018-03-17 NOTE — Telephone Encounter (Signed)
Patient called and Lmom. She did not say what about. Called patient but no answer. Lmom to have her call back. Thanks

## 2018-03-18 ENCOUNTER — Telehealth: Payer: Self-pay | Admitting: Neurology

## 2018-03-19 NOTE — Telephone Encounter (Signed)
[facility] called in regards to a referral that was sent and they will see pt on the 29th of April CB# [phone]

## 2018-03-19 NOTE — Telephone Encounter (Signed)
Noted  

## 2018-03-29 DIAGNOSIS — G20 Parkinson's disease: Secondary | ICD-10-CM | POA: Diagnosis not present

## 2018-03-29 DIAGNOSIS — Z8673 Personal history of transient ischemic attack (TIA), and cerebral infarction without residual deficits: Secondary | ICD-10-CM | POA: Diagnosis not present

## 2018-03-29 DIAGNOSIS — M4802 Spinal stenosis, cervical region: Secondary | ICD-10-CM | POA: Diagnosis not present

## 2018-03-29 DIAGNOSIS — E538 Deficiency of other specified B group vitamins: Secondary | ICD-10-CM | POA: Diagnosis not present

## 2018-03-30 ENCOUNTER — Telehealth: Payer: Self-pay | Admitting: Neurology

## 2018-03-30 DIAGNOSIS — E538 Deficiency of other specified B group vitamins: Secondary | ICD-10-CM | POA: Diagnosis not present

## 2018-03-30 DIAGNOSIS — M4802 Spinal stenosis, cervical region: Secondary | ICD-10-CM | POA: Diagnosis not present

## 2018-03-30 DIAGNOSIS — G20 Parkinson's disease: Secondary | ICD-10-CM | POA: Diagnosis not present

## 2018-03-30 DIAGNOSIS — Z8673 Personal history of transient ischemic attack (TIA), and cerebral infarction without residual deficits: Secondary | ICD-10-CM | POA: Diagnosis not present

## 2018-03-30 NOTE — Telephone Encounter (Signed)
Clair Gulling with [facility] called needing to get Verbal Orders on this patient. Please Call. Thanks

## 2018-03-30 NOTE — Telephone Encounter (Signed)
V/O given for therapy.

## 2018-03-31 ENCOUNTER — Telehealth: Payer: Self-pay | Admitting: Neurology

## 2018-03-31 NOTE — Telephone Encounter (Signed)
[facility] called needing Verbal Orders. Thanks

## 2018-03-31 NOTE — Telephone Encounter (Signed)
Verbal order given for PT. 

## 2018-04-02 DIAGNOSIS — Z8673 Personal history of transient ischemic attack (TIA), and cerebral infarction without residual deficits: Secondary | ICD-10-CM | POA: Diagnosis not present

## 2018-04-02 DIAGNOSIS — E538 Deficiency of other specified B group vitamins: Secondary | ICD-10-CM | POA: Diagnosis not present

## 2018-04-02 DIAGNOSIS — G20 Parkinson's disease: Secondary | ICD-10-CM | POA: Diagnosis not present

## 2018-04-02 DIAGNOSIS — M4802 Spinal stenosis, cervical region: Secondary | ICD-10-CM | POA: Diagnosis not present

## 2018-04-07 ENCOUNTER — Ambulatory Visit: Payer: Medicare Other | Admitting: Podiatry

## 2018-04-07 ENCOUNTER — Encounter: Payer: Self-pay | Admitting: Podiatry

## 2018-04-07 DIAGNOSIS — G20 Parkinson's disease: Secondary | ICD-10-CM | POA: Diagnosis not present

## 2018-04-07 DIAGNOSIS — L84 Corns and callosities: Secondary | ICD-10-CM

## 2018-04-07 DIAGNOSIS — M4802 Spinal stenosis, cervical region: Secondary | ICD-10-CM | POA: Diagnosis not present

## 2018-04-07 DIAGNOSIS — Z8673 Personal history of transient ischemic attack (TIA), and cerebral infarction without residual deficits: Secondary | ICD-10-CM | POA: Diagnosis not present

## 2018-04-07 DIAGNOSIS — E538 Deficiency of other specified B group vitamins: Secondary | ICD-10-CM | POA: Diagnosis not present

## 2018-04-09 DIAGNOSIS — M4802 Spinal stenosis, cervical region: Secondary | ICD-10-CM | POA: Diagnosis not present

## 2018-04-09 DIAGNOSIS — Z8673 Personal history of transient ischemic attack (TIA), and cerebral infarction without residual deficits: Secondary | ICD-10-CM | POA: Diagnosis not present

## 2018-04-09 DIAGNOSIS — G20 Parkinson's disease: Secondary | ICD-10-CM | POA: Diagnosis not present

## 2018-04-09 DIAGNOSIS — E538 Deficiency of other specified B group vitamins: Secondary | ICD-10-CM | POA: Diagnosis not present

## 2018-04-11 NOTE — Progress Notes (Signed)
Subjective:   Patient ID: Elizabeth [name], female   DOB: 84 y.o.   MRN: [id number]   HPI Patient presents with chronic callus formation bilateral   ROS      Objective:  Physical Exam  Neurovascular status intact with keratotic lesion sub-hallux bilateral painful     Assessment:  Chronic lesion formation with structural deformity also with bunion deformity     Plan:  H&P condition reviewed debrided lesions and advised this patient on continued padding and discussed structural bunion correction at one point future

## 2018-04-14 DIAGNOSIS — E538 Deficiency of other specified B group vitamins: Secondary | ICD-10-CM | POA: Diagnosis not present

## 2018-04-14 DIAGNOSIS — M4802 Spinal stenosis, cervical region: Secondary | ICD-10-CM | POA: Diagnosis not present

## 2018-04-14 DIAGNOSIS — Z8673 Personal history of transient ischemic attack (TIA), and cerebral infarction without residual deficits: Secondary | ICD-10-CM | POA: Diagnosis not present

## 2018-04-14 DIAGNOSIS — G20 Parkinson's disease: Secondary | ICD-10-CM | POA: Diagnosis not present

## 2018-04-16 DIAGNOSIS — E538 Deficiency of other specified B group vitamins: Secondary | ICD-10-CM | POA: Diagnosis not present

## 2018-04-16 DIAGNOSIS — M4802 Spinal stenosis, cervical region: Secondary | ICD-10-CM | POA: Diagnosis not present

## 2018-04-16 DIAGNOSIS — G20 Parkinson's disease: Secondary | ICD-10-CM | POA: Diagnosis not present

## 2018-04-16 DIAGNOSIS — Z8673 Personal history of transient ischemic attack (TIA), and cerebral infarction without residual deficits: Secondary | ICD-10-CM | POA: Diagnosis not present

## 2018-04-21 DIAGNOSIS — M4802 Spinal stenosis, cervical region: Secondary | ICD-10-CM | POA: Diagnosis not present

## 2018-04-21 DIAGNOSIS — G20 Parkinson's disease: Secondary | ICD-10-CM | POA: Diagnosis not present

## 2018-04-21 DIAGNOSIS — Z8673 Personal history of transient ischemic attack (TIA), and cerebral infarction without residual deficits: Secondary | ICD-10-CM | POA: Diagnosis not present

## 2018-04-21 DIAGNOSIS — E538 Deficiency of other specified B group vitamins: Secondary | ICD-10-CM | POA: Diagnosis not present

## 2018-04-23 DIAGNOSIS — G20 Parkinson's disease: Secondary | ICD-10-CM | POA: Diagnosis not present

## 2018-04-23 DIAGNOSIS — Z8673 Personal history of transient ischemic attack (TIA), and cerebral infarction without residual deficits: Secondary | ICD-10-CM | POA: Diagnosis not present

## 2018-04-23 DIAGNOSIS — M4802 Spinal stenosis, cervical region: Secondary | ICD-10-CM | POA: Diagnosis not present

## 2018-04-23 DIAGNOSIS — E538 Deficiency of other specified B group vitamins: Secondary | ICD-10-CM | POA: Diagnosis not present

## 2018-04-28 ENCOUNTER — Encounter: Payer: Self-pay | Admitting: Internal Medicine

## 2018-04-28 ENCOUNTER — Other Ambulatory Visit (INDEPENDENT_AMBULATORY_CARE_PROVIDER_SITE_OTHER): Payer: Medicare Other

## 2018-04-28 ENCOUNTER — Ambulatory Visit (INDEPENDENT_AMBULATORY_CARE_PROVIDER_SITE_OTHER): Payer: Medicare Other | Admitting: Internal Medicine

## 2018-04-28 VITALS — BP 134/78 | HR 54 | Temp 97.9°F | Resp 16 | Wt 88.0 lb

## 2018-04-28 DIAGNOSIS — R269 Unspecified abnormalities of gait and mobility: Secondary | ICD-10-CM | POA: Diagnosis not present

## 2018-04-28 DIAGNOSIS — R293 Abnormal posture: Secondary | ICD-10-CM | POA: Diagnosis not present

## 2018-04-28 DIAGNOSIS — E039 Hypothyroidism, unspecified: Secondary | ICD-10-CM

## 2018-04-28 DIAGNOSIS — Z8673 Personal history of transient ischemic attack (TIA), and cerebral infarction without residual deficits: Secondary | ICD-10-CM | POA: Diagnosis not present

## 2018-04-28 DIAGNOSIS — R5381 Other malaise: Secondary | ICD-10-CM | POA: Diagnosis not present

## 2018-04-28 DIAGNOSIS — R600 Localized edema: Secondary | ICD-10-CM

## 2018-04-28 DIAGNOSIS — D649 Anemia, unspecified: Secondary | ICD-10-CM | POA: Diagnosis not present

## 2018-04-28 DIAGNOSIS — G20 Parkinson's disease: Secondary | ICD-10-CM | POA: Diagnosis not present

## 2018-04-28 DIAGNOSIS — M4802 Spinal stenosis, cervical region: Secondary | ICD-10-CM | POA: Diagnosis not present

## 2018-04-28 DIAGNOSIS — E538 Deficiency of other specified B group vitamins: Secondary | ICD-10-CM | POA: Diagnosis not present

## 2018-04-28 LAB — TSH: TSH: 10.8 u[IU]/mL — ABNORMAL HIGH (ref 0.35–4.50)

## 2018-04-28 LAB — IBC PANEL
IRON: 58 ug/dL (ref 42–145)
Saturation Ratios: 15.8 % — ABNORMAL LOW (ref 20.0–50.0)
TRANSFERRIN: 262 mg/dL (ref 212.0–360.0)

## 2018-04-28 LAB — COMPREHENSIVE METABOLIC PANEL
ALBUMIN: 3.9 g/dL (ref 3.5–5.2)
ALT: 2 U/L (ref 0–35)
AST: 23 U/L (ref 0–37)
Alkaline Phosphatase: 101 U/L (ref 39–117)
BUN: 21 mg/dL (ref 6–23)
CALCIUM: 9.4 mg/dL (ref 8.4–10.5)
CHLORIDE: 104 meq/L (ref 96–112)
CO2: 31 mEq/L (ref 19–32)
Creatinine, Ser: 0.78 mg/dL (ref 0.40–1.20)
GFR: 74.56 mL/min (ref >60.00)
Glucose, Bld: 95 mg/dL (ref 70–99)
POTASSIUM: 4.2 meq/L (ref 3.5–5.1)
Sodium: 140 mEq/L (ref 135–145)
Total Bilirubin: 0.7 mg/dL (ref 0.2–1.2)
Total Protein: 6.7 g/dL (ref 6.0–8.3)

## 2018-04-28 LAB — CBC WITH DIFFERENTIAL/PLATELET
BASOS PCT: 0.8 % (ref 0.0–3.0)
Basophils Absolute: 0 10*3/uL (ref 0.0–0.1)
EOS ABS: 0.2 10*3/uL (ref 0.0–0.7)
EOS PCT: 4.3 % (ref 0.0–5.0)
HEMATOCRIT: 35.8 % — AB (ref 36.0–46.0)
HEMOGLOBIN: 11.7 g/dL — AB (ref 12.0–15.0)
LYMPHS PCT: 36 % (ref 12.0–46.0)
Lymphs Abs: 1.7 10*3/uL (ref 0.7–4.0)
MCHC: 32.6 g/dL (ref 30.0–36.0)
MCV: 85.3 fl (ref 78.0–100.0)
MONOS PCT: 11.3 % (ref 3.0–12.0)
Monocytes Absolute: 0.5 10*3/uL (ref 0.1–1.0)
Neutro Abs: 2.2 10*3/uL (ref 1.4–7.7)
Neutrophils Relative %: 47.6 % (ref 43.0–77.0)
Platelets: 228 10*3/uL (ref 150.0–400.0)
RBC: 4.19 Mil/uL (ref 3.87–5.11)
RDW: 16.7 % — AB (ref 11.5–15.5)
WBC: 4.7 10*3/uL (ref 4.0–10.5)

## 2018-04-28 LAB — IRON: Iron: 58 ug/dL (ref 42–145)

## 2018-04-28 LAB — FERRITIN: Ferritin: 34.4 ng/mL (ref 10.0–291.0)

## 2018-04-28 MED ORDER — FUROSEMIDE 20 MG PO TABS: 10.0000 mg | ORAL_TABLET | Freq: Every day | ORAL | Status: DC

## 2018-04-28 NOTE — Assessment & Plan Note (Signed)
Taking B12 Check iron panel, CBC

## 2018-04-28 NOTE — Patient Instructions (Signed)
  Test(s) ordered today. Your results will be released to [facility] (or called to you) after review, usually within 72hours after test completion. If any changes need to be made, you will be notified at that same time.    Medications reviewed and updated.  No changes recommended at this time.    A referral was ordered for physical therapy  Please followup in 6 months

## 2018-04-28 NOTE — Assessment & Plan Note (Signed)
Referral for home PT

## 2018-04-28 NOTE — Progress Notes (Signed)
Subjective:    Patient ID: Elizabeth [name], female    DOB: May 26, 1931, 84 y.o.   MRN: [id number]  HPI The patient is here for follow up.  Hypothyroidism:  She is taking her medication daily.  She denies any recent changes in energy or weight that are unexplained.   B/l leg edema:  She elevates her legs when she sits.  We started lasix 10 mg daily 2 months ago.  She takes the lasix about 3 days a week.  She does not take it when she goes out and feels she is averaging about 3 times a week.  The swelling is better.  She still has swelling.  She is walking better.  She does try to elevate the legs when sitting.  Neck pain, poor posture, physical deconditioning, poor balance:  She has been doing PT for her neck.  He was applying heat and stretching her neck.  It has helped.  She wants to be able to stand up straight.  She is exercising every day.   She feels she would benefit from additional physical therapy if it is covered by her insurance.  Anemia: She denies any obvious bleeding.  She is taking vitamin B12.  Medications and allergies reviewed with patient and updated if appropriate.  Patient Active Problem List   Diagnosis Date Noted  . Anxiety 03/02/2018  . Urinary frequency 03/02/2018  . Foot pain, bilateral 12/21/2017  . Bilateral leg edema 12/02/2017  . Overactive bladder 09/19/2017  . Post-polio syndrome   . Vitamin B12 deficiency   . Fall 09/11/2017  . Subdural hygroma 09/11/2017  . UTI (urinary tract infection) 06/24/2017  . Swallowing disorder 03/04/2017  . Parkinson disease ([place]) 06/25/2016  . Restless leg syndrome 12/25/2015  . Arthritis 12/25/2015  . Abnormality of gait 05/24/2015  . History of CVA (cerebrovascular accident) 07/20/2012  . Cervicalgia 01/17/2009  . Irritable bowel syndrome (IBS) 01/17/2009  . Urinary incontinence 09/07/2008  . Anemia 04/26/2008  . HYPERLIPIDEMIA 01/26/2008  . History of TIA (transient ischemic attack) 01/26/2008  . Hypothyroidism  01/04/2008  . Osteopenia 01/04/2008  . Abnormal Pap smear of vagina 09/24/2006    Current Outpatient Medications on File Prior to Visit  Medication Sig Dispense Refill  . aspirin 81 MG tablet Take 81 mg by mouth daily.      . Calcium Carbonate-Vitamin D (CALCIUM + D PO) Take 1 tablet by mouth daily. Reported on 12/25/2015    . Carbidopa-Levodopa ER (RYTARY) 36.25-145 MG CPCR Take 1 tablet by mouth 3 (three) times daily. 90 capsule 3  . cholecalciferol (VITAMIN D) 1000 UNITS tablet Take 1,000 Units by mouth daily.      Marland Kitchen levothyroxine (SYNTHROID, LEVOTHROID) 100 MCG tablet Take 1 tablet (100 mcg total) by mouth daily. 90 tablet 3  . pramipexole (MIRAPEX) 0.125 MG tablet Take 1 tablet (0.125 mg total) by mouth at bedtime. 30 tablet 3  . Simethicone (GAS-X EXTRA STRENGTH) 125 MG CAPS Take 125 mg by mouth daily as needed (gas).    . vitamin B-12 (CYANOCOBALAMIN) 1000 MCG tablet Take 1 tablet (1,000 mcg total) by mouth daily. 90 tablet 3   No current facility-administered medications on file prior to visit.     Past Medical History:  Diagnosis Date  . Abnormal Pap smear of vagina 09/2006   Dr Nori Riis  . Abnormality of gait 05/24/2015   PMH R occipital infarct B12 deficiency H/o polio affected left leg  In Physical Therapy to improve balance & strength  through [employer]  . Arthritis 12/25/2015  . Cervicalgia 01/17/2009   MRI 03/27/16:  IMPRESSION: 1. At C3-4 there is a mild broad-based disc bulge. Moderate bilateral facet arthropathy and uncovertebral degenerative changes resulting in moderate bilateral foraminal stenosis. 2. At C5-6 there is a broad-based disc osteophyte complex. Bilateral uncovertebral degenerative changes and facet arthropathy resulting in moderate bilateral foraminal stenosis.  . Fall 09/11/2017  . History of CVA (cerebrovascular accident) 07/20/2012   Chronic R occipital infarct ; stable on CT 05/21/2012 .Dr Jannifer Franklin , Neurology   . History of TIA (transient ischemic attack)  01/26/2008  . HYPERLIPIDEMIA 01/26/2008   NMR Lipoprofile 2009: LDL 135 (1704/911), HDL 62, TG 131. LDL goal = < 100; ideally < 70. MGF MI in 17s No FH CVA  . Hypothyroidism   . Intention tremor 12/08/2013   Onset after fall 04/2012   . Irritable bowel syndrome (IBS) 01/17/2009   Dr. Unice Cobble  . Osteopenia    PMH fracture heel, wrist  . Parkinson disease ([place]) 06/25/2016   Dr Tat  . Pernicious anemia   . Post-polio syndrome    minor problems L leg  . Restless leg syndrome 12/25/2015   Dr Tat  . Subdural hygroma 09/11/2017  . Swallowing disorder 03/04/2017  . Urinary incontinence 09/07/2008   Dr McDiarmid, Urology   . Vitamin B12 deficiency   . Weakness 06/24/2017    Past Surgical History:  Procedure Laterality Date  . BLADDER SUSPENSION  2000  . CATARACT EXTRACTION, BILATERAL     Dr. Gershon Crane  . no colonoscopy     "I never felt I needed one "  . YAG LASER APPLICATION Left    Dr. Gershon Crane    Social History   Socioeconomic History  . Marital status: Single    Spouse name: Not on file  . Number of children: Not on file  . Years of education: Not on file  . Highest education level: Not on file  Occupational History  . Not on file  Social Needs  . Financial resource strain: Not on file  . Food insecurity:    Worry: Not on file    Inability: Not on file  . Transportation needs:    Medical: Not on file    Non-medical: Not on file  Tobacco Use  . Smoking status: Never Smoker  . Smokeless tobacco: Never Used  Substance and Sexual Activity  . Alcohol use: Yes    Alcohol/week: 0.6 oz    Types: 1 Glasses of wine per week    Comment: 0.6 oz per week  . Drug use: No  . Sexual activity: Not on file  Lifestyle  . Physical activity:    Days per week: Not on file    Minutes per session: Not on file  . Stress: Not on file  Relationships  . Social connections:    Talks on phone: Not on file    Gets together: Not on file    Attends religious service: Not on file    Active  member of club or organization: Not on file    Attends meetings of clubs or organizations: Not on file    Relationship status: Not on file  Other Topics Concern  . Not on file  Social History Narrative  . Not on file    Family History  Problem Relation Age of Onset  . Heart attack Maternal Grandfather        70s  . Emphysema Mother   . Hypertension Mother   .  Alzheimer's disease Father   . Breast cancer Maternal Grandmother   . Cancer Maternal Grandmother        breast  . Diabetes Neg Hx   . Stroke Neg Hx   . Parkinson's disease Neg Hx     Review of Systems  Constitutional: Negative for fever.  Respiratory: Negative for cough, shortness of breath and wheezing.   Cardiovascular: Positive for leg swelling. Negative for chest pain and palpitations.  Musculoskeletal: Positive for gait problem, neck pain and neck stiffness.  Neurological: Negative for light-headedness and headaches.       Objective:   Vitals:   04/28/18 1437  BP: 134/78  Pulse: (!) 54  Resp: 16  Temp: 97.9 F (36.6 C)  SpO2: 95%   BP Readings from Last 3 Encounters:  04/28/18 134/78  03/16/18 90/60  03/02/18 138/76   Wt Readings from Last 3 Encounters:  04/28/18 88 lb (39.9 kg)  03/16/18 86 lb (39 kg)  03/02/18 89 lb 4 oz (40.5 kg)   Body mass index is 18.39 kg/m.   Physical Exam    Constitutional: Appears well-developed and well-nourished. No distress.  HENT:  Head: Normocephalic and atraumatic.  Neck: Neck supple. No tracheal deviation present. No thyromegaly present.  No cervical lymphadenopathy Cardiovascular: Normal rate, regular rhythm and normal heart sounds.   No murmur heard. No carotid bruit .  1+ pitting edema bilateral lower extremities two thirds up lower leg, mild erythema and warmth to right lower extremity-right lower extremity edema worse than left Pulmonary/Chest: Effort normal and breath sounds normal. No respiratory distress. No has no wheezes. No rales.  Skin: Skin is  warm and dry. Not diaphoretic.  Psychiatric: Normal mood and affect. Behavior is normal.      Assessment & Plan:    See Problem List for Assessment and Plan of chronic medical problems.

## 2018-04-28 NOTE — Assessment & Plan Note (Signed)
Continue regular exercise Referral for home PT

## 2018-04-28 NOTE — Assessment & Plan Note (Signed)
Improved, but still significant edema for her She is tolerating 10 mg 3 times a week-does not take it when she goes out that day CMP to evaluate kidney function and potassium Continue elevating legs when able Expect edema to improve slowly Depending on blood work can increase the dose for short period of time if she wishes

## 2018-04-28 NOTE — Assessment & Plan Note (Signed)
Check TSH We will to adjust medication if needed

## 2018-04-28 NOTE — Assessment & Plan Note (Signed)
Chronic Referral for home PT

## 2018-04-30 ENCOUNTER — Other Ambulatory Visit: Payer: Self-pay | Admitting: Emergency Medicine

## 2018-04-30 DIAGNOSIS — E039 Hypothyroidism, unspecified: Secondary | ICD-10-CM

## 2018-04-30 MED ORDER — LEVOTHYROXINE SODIUM 125 MCG PO TABS: 125.0000 ug | ORAL_TABLET | Freq: Every day | ORAL | 1 refills | Status: DC

## 2018-05-07 ENCOUNTER — Telehealth: Payer: Self-pay | Admitting: Internal Medicine

## 2018-05-07 NOTE — Telephone Encounter (Signed)
Copied from [place] [id number]. Topic: Quick Communication - See Telephone Encounter >> May 07, 2018  3:50 PM Boyd Kerbs wrote: CRM for notification. See Telephone encounter for: 05/07/18.  Got referral and PT , asking if can be started on Monday. Please call with Verbal Orders Lenna Sciara - [facility] - [phone]

## 2018-05-10 NOTE — Telephone Encounter (Signed)
LPN Varney Biles [phone] from [facility] calling stating that the pt want start PT until the 19th of June

## 2018-05-10 NOTE — Telephone Encounter (Signed)
Spoke with Melissa to give verbal orders to start PT.

## 2018-05-10 NOTE — Telephone Encounter (Signed)
Noted  

## 2018-05-11 DIAGNOSIS — M8580 Other specified disorders of bone density and structure, unspecified site: Secondary | ICD-10-CM | POA: Diagnosis not present

## 2018-05-11 DIAGNOSIS — E039 Hypothyroidism, unspecified: Secondary | ICD-10-CM | POA: Diagnosis not present

## 2018-05-11 DIAGNOSIS — G2581 Restless legs syndrome: Secondary | ICD-10-CM | POA: Diagnosis not present

## 2018-05-11 DIAGNOSIS — G20 Parkinson's disease: Secondary | ICD-10-CM | POA: Diagnosis not present

## 2018-05-11 DIAGNOSIS — N3281 Overactive bladder: Secondary | ICD-10-CM | POA: Diagnosis not present

## 2018-05-11 DIAGNOSIS — K589 Irritable bowel syndrome without diarrhea: Secondary | ICD-10-CM | POA: Diagnosis not present

## 2018-05-11 DIAGNOSIS — D649 Anemia, unspecified: Secondary | ICD-10-CM | POA: Diagnosis not present

## 2018-05-11 DIAGNOSIS — M542 Cervicalgia: Secondary | ICD-10-CM | POA: Diagnosis not present

## 2018-05-11 DIAGNOSIS — M1991 Primary osteoarthritis, unspecified site: Secondary | ICD-10-CM | POA: Diagnosis not present

## 2018-05-11 DIAGNOSIS — E785 Hyperlipidemia, unspecified: Secondary | ICD-10-CM | POA: Diagnosis not present

## 2018-05-11 DIAGNOSIS — E538 Deficiency of other specified B group vitamins: Secondary | ICD-10-CM | POA: Diagnosis not present

## 2018-05-18 ENCOUNTER — Ambulatory Visit: Payer: Self-pay | Admitting: Internal Medicine

## 2018-05-18 NOTE — Telephone Encounter (Signed)
Pt states that after she goes to the bathroom she feels like she is not getting all the stool out. She stated that her stools are not hard and she has a daily BM is after eats breakfast in the am.  Pt states that she will go to the bathroom later in the day to urinate and will wipe more stool away. Advised pt to try taking a fiber supplement and to try with increasing her water intake. Pt concerned this will cause diarrhea. Pt mentioned she will try probiotic and see how that works. Care advice given.  Reason for Disposition . Treating constipation with Over-The-Counter (OTC) medicines, questions about  Answer Assessment - Initial Assessment Questions 1. STOOL PATTERN OR FREQUENCY: "How often do you pass bowel movements (BMs)?"  (Normal range: tid to q 3 days)  "When was the last BM passed?"   Every am 2. STRAINING: "Do you have to strain to have a BM?"  no 3. RECTAL PAIN: "Does your rectum hurt when the stool comes out?" If so, ask: "Do you have hemorrhoids? How bad is the pain?"  (Scale 1-10; or mild, moderate, severe)     No-no hemorrhoids 4. STOOL COMPOSITION: "Are the stools hard?"      no 5. BLOOD ON STOOLS: "Has there been any blood on the toilet tissue or on the surface of the BM?" If so, ask: "When was the last time?"      no 6. CHRONIC CONSTIPATION: "Is this a new problem for you?"  If no, ask: How long have you had this problem?" (days, weeks, months)      not 7. CHANGES IN DIET: "Have there been any recent changes in your diet?"      no 8. MEDICATIONS: "Have you been taking any new medications?"     no 9. LAXATIVES: "Have you been using any laxatives or enemas?"  If yes, ask "What, how often, and when was the last time?"     no 10. CAUSE: "What do you think is causing the constipation?"        Something she is eating 11. OTHER SYMPTOMS: "Do you have any other symptoms?" (e.g., abdominal pain, fever, vomiting)       no 12. PREGNANCY: "Is there any chance you are pregnant?"  "When was your last menstrual period?"  n/a  Protocols used: CONSTIPATION-A-AH

## 2018-05-23 ENCOUNTER — Emergency Department (HOSPITAL_COMMUNITY)
Admission: EM | Admit: 2018-05-23 | Discharge: 2018-05-24 | Disposition: A | Discharge status: Skilled Nursing Facility | Payer: Medicare Other | Attending: Emergency Medicine | Admitting: Emergency Medicine

## 2018-05-23 ENCOUNTER — Emergency Department (HOSPITAL_COMMUNITY): Payer: Medicare Other

## 2018-05-23 ENCOUNTER — Emergency Department (HOSPITAL_COMMUNITY)
Admission: EM | Admit: 2018-05-23 | Discharge: 2018-05-23 | Disposition: A | Discharge status: Home / Self Care | Payer: Medicare Other | Source: Home / Self Care | Attending: Emergency Medicine | Admitting: Emergency Medicine

## 2018-05-23 ENCOUNTER — Encounter (HOSPITAL_COMMUNITY): Payer: Self-pay | Admitting: Emergency Medicine

## 2018-05-23 DIAGNOSIS — Y939 Activity, unspecified: Secondary | ICD-10-CM | POA: Insufficient documentation

## 2018-05-23 DIAGNOSIS — S6992XA Unspecified injury of left wrist, hand and finger(s), initial encounter: Secondary | ICD-10-CM | POA: Diagnosis not present

## 2018-05-23 DIAGNOSIS — S60222A Contusion of left hand, initial encounter: Secondary | ICD-10-CM

## 2018-05-23 DIAGNOSIS — Y999 Unspecified external cause status: Secondary | ICD-10-CM | POA: Insufficient documentation

## 2018-05-23 DIAGNOSIS — Z7982 Long term (current) use of aspirin: Secondary | ICD-10-CM | POA: Insufficient documentation

## 2018-05-23 DIAGNOSIS — E039 Hypothyroidism, unspecified: Secondary | ICD-10-CM

## 2018-05-23 DIAGNOSIS — Z79899 Other long term (current) drug therapy: Secondary | ICD-10-CM | POA: Insufficient documentation

## 2018-05-23 DIAGNOSIS — Y92009 Unspecified place in unspecified non-institutional (private) residence as the place of occurrence of the external cause: Secondary | ICD-10-CM | POA: Insufficient documentation

## 2018-05-23 DIAGNOSIS — G20 Parkinson's disease: Secondary | ICD-10-CM | POA: Insufficient documentation

## 2018-05-23 DIAGNOSIS — Z8673 Personal history of transient ischemic attack (TIA), and cerebral infarction without residual deficits: Secondary | ICD-10-CM | POA: Insufficient documentation

## 2018-05-23 DIAGNOSIS — S61512A Laceration without foreign body of left wrist, initial encounter: Secondary | ICD-10-CM | POA: Insufficient documentation

## 2018-05-23 DIAGNOSIS — M25531 Pain in right wrist: Secondary | ICD-10-CM | POA: Diagnosis not present

## 2018-05-23 DIAGNOSIS — Z9181 History of falling: Secondary | ICD-10-CM | POA: Insufficient documentation

## 2018-05-23 DIAGNOSIS — R531 Weakness: Secondary | ICD-10-CM | POA: Diagnosis not present

## 2018-05-23 DIAGNOSIS — W010XXA Fall on same level from slipping, tripping and stumbling without subsequent striking against object, initial encounter: Secondary | ICD-10-CM

## 2018-05-23 DIAGNOSIS — R296 Repeated falls: Secondary | ICD-10-CM | POA: Diagnosis not present

## 2018-05-23 DIAGNOSIS — R5383 Other fatigue: Secondary | ICD-10-CM | POA: Diagnosis present

## 2018-05-23 DIAGNOSIS — S0990XA Unspecified injury of head, initial encounter: Secondary | ICD-10-CM | POA: Diagnosis not present

## 2018-05-23 DIAGNOSIS — S299XXA Unspecified injury of thorax, initial encounter: Secondary | ICD-10-CM | POA: Diagnosis not present

## 2018-05-23 LAB — CBC WITH DIFFERENTIAL/PLATELET
BASOS ABS: 0 10*3/uL (ref 0.0–0.1)
Basophils Relative: 0 %
EOS PCT: 1 %
Eosinophils Absolute: 0.1 10*3/uL (ref 0.0–0.7)
HEMATOCRIT: 31.8 % — AB (ref 36.0–46.0)
HEMOGLOBIN: 10.6 g/dL — AB (ref 12.0–15.0)
LYMPHS PCT: 23 %
Lymphs Abs: 1.1 10*3/uL (ref 0.7–4.0)
MCH: 28.4 pg (ref 26.0–34.0)
MCHC: 33.3 g/dL (ref 30.0–36.0)
MCV: 85.3 fL (ref 78.0–100.0)
Monocytes Absolute: 0.5 10*3/uL (ref 0.1–1.0)
Monocytes Relative: 11 %
NEUTROS ABS: 3.3 10*3/uL (ref 1.7–7.7)
NEUTROS PCT: 65 %
Platelets: 206 10*3/uL (ref 150–400)
RBC: 3.73 MIL/uL — AB (ref 3.87–5.11)
RDW: 15.7 % — ABNORMAL HIGH (ref 11.5–15.5)
WBC: 5 10*3/uL (ref 4.0–10.5)

## 2018-05-23 LAB — URINALYSIS, ROUTINE W REFLEX MICROSCOPIC
Bilirubin Urine: NEGATIVE
Glucose, UA: NEGATIVE mg/dL
Ketones, ur: 5 mg/dL — AB
Leukocytes, UA: NEGATIVE
NITRITE: NEGATIVE
PROTEIN: 30 mg/dL — AB
SPECIFIC GRAVITY, URINE: 1.019 (ref 1.005–1.030)
pH: 5 (ref 5.0–8.0)

## 2018-05-23 LAB — TSH: TSH: 0.316 u[IU]/mL — ABNORMAL LOW (ref 0.350–4.500)

## 2018-05-23 LAB — BASIC METABOLIC PANEL WITH GFR
Anion gap: 8 (ref 5–15)
BUN: 25 mg/dL — ABNORMAL HIGH (ref 8–23)
CO2: 26 mmol/L (ref 22–32)
Calcium: 8.3 mg/dL — ABNORMAL LOW (ref 8.9–10.3)
Chloride: 109 mmol/L (ref 98–111)
Creatinine, Ser: 0.84 mg/dL (ref 0.44–1.00)
GFR calc Af Amer: >60 mL/min
GFR calc non Af Amer: >60 mL/min
Glucose, Bld: 99 mg/dL (ref 70–99)
Potassium: 3.3 mmol/L — ABNORMAL LOW (ref 3.5–5.1)
Sodium: 143 mmol/L (ref 135–145)

## 2018-05-23 LAB — TROPONIN I: Troponin I: <0.03 ng/mL

## 2018-05-23 MED ORDER — BACITRACIN ZINC 500 UNIT/GM EX OINT
TOPICAL_OINTMENT | CUTANEOUS | Status: AC
  Administered 2018-05-23: 1
  Filled 2018-05-23: qty 0.9

## 2018-05-23 MED ORDER — PRAMIPEXOLE DIHYDROCHLORIDE 0.125 MG PO TABS
0.1250 mg | ORAL_TABLET | Freq: Every day | ORAL | Status: DC
  Administered 2018-05-23: 0.125 mg via ORAL
  Filled 2018-05-23 (×2): qty 1

## 2018-05-23 MED ORDER — LIDOCAINE HCL (PF) 1 % IJ SOLN
5.0000 mL | Freq: Once | INTRAMUSCULAR | Status: AC
  Administered 2018-05-23: 5 mL
  Filled 2018-05-23: qty 30

## 2018-05-23 MED ORDER — LEVOTHYROXINE SODIUM 125 MCG PO TABS
125.0000 ug | ORAL_TABLET | Freq: Every day | ORAL | Status: DC
  Administered 2018-05-24: 125 ug via ORAL
  Filled 2018-05-23: qty 1

## 2018-05-23 NOTE — ED Triage Notes (Signed)
Patient here from home with complaints of fall this morning while going to the bathroom. Reports that she stopped by the fire station and they wrapped it. Bleeding controlled. Bruising and swelling noted. Denies use of blood thinners.

## 2018-05-23 NOTE — ED Provider Notes (Signed)
[facility] DEPT Provider Note   CSN: [id number] Arrival date & time: 05/23/18  1628     History   Chief Complaint Chief Complaint  Patient presents with  . Fatigue    HPI Elizabeth [name] is a 84 y.o. female.  HPI Patient lives alone and has had multiple recent falls.  She was seen this morning in the emergency department after a fall which she sustained a left wrist laceration.  Laceration was repaired and tetanus was updated.  Patient was unable to ambulate safely.  Had social work and case management see the patient.  Patient wants to be discharged home and refused to wait for OT/PT evaluation.  Was unable to get into her car and came back to the emergency department.  She is currently asking for food. Past Medical History:  Diagnosis Date  . Abnormal Pap smear of vagina 09/2006   Dr Nori Riis  . Abnormality of gait 05/24/2015   PMH R occipital infarct B12 deficiency H/o polio affected left leg  In Physical Therapy to improve balance & strength through [facility]  . Arthritis 12/25/2015  . Cervicalgia 01/17/2009   MRI 03/27/16:  IMPRESSION: 1. At C3-4 there is a mild broad-based disc bulge. Moderate bilateral facet arthropathy and uncovertebral degenerative changes resulting in moderate bilateral foraminal stenosis. 2. At C5-6 there is a broad-based disc osteophyte complex. Bilateral uncovertebral degenerative changes and facet arthropathy resulting in moderate bilateral foraminal stenosis.  . Fall 09/11/2017  . History of CVA (cerebrovascular accident) 07/20/2012   Chronic R occipital infarct ; stable on CT 05/21/2012 .Dr Jannifer Franklin , Neurology   . History of TIA (transient ischemic attack) 01/26/2008  . HYPERLIPIDEMIA 01/26/2008   NMR Lipoprofile 2009: LDL 135 (1704/911), HDL 62, TG 131. LDL goal = < 100; ideally < 70. MGF MI in 31s No FH CVA  . Hypothyroidism   . Intention tremor 12/08/2013   Onset after fall 04/2012   . Irritable bowel syndrome (IBS) 01/17/2009     Dr. Unice Cobble  . Osteopenia    PMH fracture heel, wrist  . Parkinson disease ([place]) 06/25/2016   Dr Tat  . Pernicious anemia   . Post-polio syndrome    minor problems L leg  . Restless leg syndrome 12/25/2015   Dr Tat  . Subdural hygroma 09/11/2017  . Swallowing disorder 03/04/2017  . Urinary incontinence 09/07/2008   Dr McDiarmid, Urology   . Vitamin B12 deficiency   . Weakness 06/24/2017    Patient Active Problem List   Diagnosis Date Noted  . Poor posture 04/28/2018  . Physical deconditioning 04/28/2018  . Anxiety 03/02/2018  . Urinary frequency 03/02/2018  . Foot pain, bilateral 12/21/2017  . Bilateral leg edema 12/02/2017  . Overactive bladder 09/19/2017  . Post-polio syndrome   . Vitamin B12 deficiency   . Fall 09/11/2017  . Subdural hygroma 09/11/2017  . UTI (urinary tract infection) 06/24/2017  . Swallowing disorder 03/04/2017  . Parkinson disease ([place]) 06/25/2016  . Restless leg syndrome 12/25/2015  . Arthritis 12/25/2015  . Abnormality of gait 05/24/2015  . History of CVA (cerebrovascular accident) 07/20/2012  . Cervicalgia 01/17/2009  . Irritable bowel syndrome (IBS) 01/17/2009  . Urinary incontinence 09/07/2008  . Anemia 04/26/2008  . HYPERLIPIDEMIA 01/26/2008  . History of TIA (transient ischemic attack) 01/26/2008  . Hypothyroidism 01/04/2008  . Osteopenia 01/04/2008  . Abnormal Pap smear of vagina 09/24/2006    Past Surgical History:  Procedure Laterality Date  . BLADDER SUSPENSION  2000  .  CATARACT EXTRACTION, BILATERAL     Dr. Gershon Crane  . no colonoscopy     "I never felt I needed one "  . YAG LASER APPLICATION Left    Dr. Gershon Crane     OB History   None      Home Medications    Prior to Admission medications   Medication Sig Start Date End Date Taking? Authorizing Provider  Carbidopa-Levodopa ER (RYTARY) 36.25-145 MG CPCR Take 1 tablet by mouth 3 (three) times daily. 11/10/17  Yes Tat, Eustace Quail, DO  pramipexole (MIRAPEX) 0.125 MG  tablet Take 1 tablet (0.125 mg total) by mouth at bedtime. 11/10/17  Yes Tat, Eustace Quail, DO  Probiotic Product (PROBIOTIC PO) Take 1 capsule by mouth daily.   Yes [provider]  vitamin B-12 (CYANOCOBALAMIN) 1000 MCG tablet Take 1 tablet (1,000 mcg total) by mouth daily. 08/06/16  Yes Burns, Claudina Lick, MD  aspirin 81 MG tablet Take 81 mg by mouth daily.      [provider]  Calcium Carbonate-Vitamin D (CALCIUM + D PO) Take 1 tablet by mouth daily. Reported on 12/25/2015    [provider]  furosemide (LASIX) 20 MG tablet Take 0.5 tablets (10 mg total) by mouth daily. 04/28/18   Binnie Rail, MD  levothyroxine (SYNTHROID, LEVOTHROID) 125 MCG tablet Take 1 tablet (125 mcg total) by mouth daily. 04/30/18   Binnie Rail, MD  permethrin (ELIMITE) 5 % cream Apply 1 application topically daily. 04/14/18   [provider]  Simethicone (GAS-X EXTRA STRENGTH) 125 MG CAPS Take 125 mg by mouth daily as needed (gas).    [provider]    Family History Family History  Problem Relation Age of Onset  . Heart attack Maternal Grandfather        70s  . Emphysema Mother   . Hypertension Mother   . Alzheimer's disease Father   . Breast cancer Maternal Grandmother   . Cancer Maternal Grandmother        breast  . Diabetes Neg Hx   . Stroke Neg Hx   . Parkinson's disease Neg Hx     Social History Social History   Tobacco Use  . Smoking status: Never Smoker  . Smokeless tobacco: Never Used  Substance Use Topics  . Alcohol use: Yes    Alcohol/week: 0.6 oz    Types: 1 Glasses of wine per week    Comment: 0.6 oz per week  . Drug use: No     Allergies   Atorvastatin; Crestor [rosuvastatin calcium]; and Vesicare [solifenacin]   Review of Systems Review of Systems  Constitutional: Negative for chills and fever.  HENT: Negative for facial swelling and voice change.   Eyes: Negative for visual disturbance.  Respiratory: Negative for cough and shortness  of breath.   Cardiovascular: Negative for chest pain.  Gastrointestinal: Negative for abdominal pain, nausea and vomiting.  Genitourinary: Negative for dysuria and flank pain.  Musculoskeletal: Positive for arthralgias, gait problem and joint swelling. Negative for back pain and neck pain.  Skin: Positive for wound.  Neurological: Positive for weakness. Negative for dizziness, speech difficulty, numbness and headaches.  All other systems reviewed and are negative.    Physical Exam Updated Vital Signs BP (!) 118/52 (BP Location: Right Arm)   Pulse 79   Temp 98.3 F (36.8 C) (Oral)   Resp 16   Ht 5' (1.524 m)   Wt 43.5 kg (96 lb)   SpO2 97%   BMI 18.75  kg/m   Physical Exam  Constitutional: She is oriented to person, place, and time. She appears well-developed and well-nourished. No distress.  HENT:  Head: Normocephalic and atraumatic.  Mouth/Throat: Oropharynx is clear and moist.  No obvious scalp injury.  No intraoral injury.  Eyes: Pupils are equal, round, and reactive to light. EOM are normal.  Neck: Normal range of motion. Neck supple.  No posterior midline cervical tenderness to palpation.  Cardiovascular: Normal rate and regular rhythm. Exam reveals no gallop and no friction rub.  No murmur heard. Pulmonary/Chest: Effort normal and breath sounds normal. No stridor. No respiratory distress. She has no wheezes. She has no rales. She exhibits no tenderness.  Abdominal: Soft. Bowel sounds are normal. There is no tenderness. There is no rebound and no guarding.  Musculoskeletal: Normal range of motion. She exhibits edema. She exhibits no tenderness.  Swelling to left wrist and hand.  Clean dressing intact.  No midline thoracic or lumbar tenderness.  Pelvis stable.  Distal pulses intact.  Neurological: She is alert and oriented to person, place, and time.  5/5 motor all extremities.  Sensation fully intact.  Skin: Skin is warm and dry. Capillary refill takes less than 2  seconds. No rash noted. She is not diaphoretic. No erythema.  Psychiatric: She has a normal mood and affect. Her behavior is normal.  Nursing note and vitals reviewed.    ED Treatments / Results  Labs (all labs ordered are listed, but only abnormal results are displayed) Labs Reviewed  TSH - Abnormal; Notable for the following components:      Result Value   TSH 0.316 (*)    All other components within normal limits    EKG None  Radiology No results found.  Procedures Procedures (including critical care time)  Medications Ordered in ED Medications - No data to display   Initial Impression / Assessment and Plan / ED Course  I have reviewed the triage vital signs and the nursing notes.  Pertinent labs & imaging results that were available during my care of the patient were reviewed by me and considered in my medical decision making (see chart for details).     Discussed with case management, Elmo Putt.  Advised social work consult, PT and OT consult in the morning.  Placed holding orders.  We will also check TSH given history of hypothyroidism which may be contributing to her symptoms.  Final Clinical Impressions(s) / ED Diagnoses   Final diagnoses:  Weakness    ED Discharge Orders    None       Julianne Rice, MD 05/25/18 1507
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
## 2018-05-23 NOTE — Progress Notes (Signed)
PT Cancellation Note  Patient Details Name: Elizabeth [name] MRN: [id number] DOB: 03/02/1931   Cancelled Treatment:    Reason Eval/Treat Not Completed: Patient at procedure or test/unavailable(pt leaving floor to go to radiology on PT arrival, will continue efforts)   [facility] 05/23/2018, 4:22 PM

## 2018-05-23 NOTE — Progress Notes (Addendum)
CSW received consult for possible placement. CSW informed that a PT consult has been placed. CSW will follow up once PT completes assessment.   Stephanie Acre, LCSW-A Clinical Social Worker [phone]    Update, 4:25 PT has not assessed patient at this time and patient is expected to discharge home today. Patient eager to return home. Assessment completed. CSW signing off.  Stephanie Acre, [place] Social Worker [phone]

## 2018-05-23 NOTE — Care Management Note (Addendum)
Case Management Note  Patient Details  Name: Elizabeth [name] MRN: [id number] Date of Birth: June 09, 1931  Subjective/Objective:   falls                 Action/Plan: NCM went to speak to pt at bedside. Unable to arouse. Pt's caregiver, Judeth Porch was in the room. States she stays 2 days per week with pt and family pays out of pocket. Her nephew, Latausha Flamm [place] # [phone]. States he is out of town until [place]. States she was at [facility] rehab in the past. He is agreeable to SNF rehab if recommended. Contacted attending for PT/OT consult prior to dc home. Pt lives in her home alone. HH will not come out if there are safety concerns.   CSW referral. Waiting PT/OT recommendations.   Expected Discharge Date:                  Expected Discharge Plan:  [place]  In-House Referral:  Clinical Social Work  Discharge planning Services  CM Consult  Post Acute Care Choice:  Home Health Choice offered to:  [facility] POA / Guardian  DME Arranged:  N/A DME Agency:  [facility] [employer]  [place] Arranged:  RN, PT, Nurse's Aide [place] Agency:     Status of Service:  In process, will continue to follow  If discussed at Long Length of Stay Meetings, dates discussed:    Additional Comments:  Erenest Rasher, RN 05/23/2018, 1:33 PM

## 2018-05-23 NOTE — Discharge Instructions (Addendum)
You were seen after a fall with injury to your left wrist and hand.  Your x-rays did not show an obvious fracture but there was a lot of arthritis changes and there could be a fracture that would be more difficult to find.  We sutured the wound to help with approximate the opening but your skin is very thin.  You can use soap and water to the area and apply some antibiotic ointment.  You will need the stitches taken out in about 7 to 10 days.  We are also very concerned that you have had multiple falls more recently.  We offered you evaluations by physical and occupational therapy which she declined.  It is really important that you contact your primary care doctor and let them know how you are doing.  Please return if any signs of infection or other concerns.

## 2018-05-23 NOTE — Progress Notes (Addendum)
Faxed referral to [facility] for [facility], Start, aide. Jonnie Finner RN CCM Case Mgmt phone [phone]

## 2018-05-23 NOTE — ED Provider Notes (Signed)
[facility] DEPT Provider Note   CSN: [id number] Arrival date & time: 05/23/18  0813     History   Chief Complaint Chief Complaint  Patient presents with  . Fall  . Hand Injury  . Hand Pain    HPI Elizabeth [name] is a 84 y.o. female.  Patient had a fall at home while walking to the bathroom around 430 this morning.  She injured her left wrist.  She denies any LOC no head pain neck pain. No preceding symptoms prior to the fall.  Not associated with any numbness or weakness.  She has a laceration over her wrist that her neighbor tried to Steri-Strip but it continued to bleed so they went to the fire station where it was recommended that she come to the emergency department for evaluation.  Patient states the pain is minimal and she does not want any medication for it.  The history is provided by the patient and a relative.  Fall  This is a new problem. The current episode started 3 to 5 hours ago. Pertinent negatives include no chest pain, no abdominal pain and no headaches. The symptoms are aggravated by bending. The symptoms are relieved by rest. She has tried rest for the symptoms. The treatment provided moderate relief.    Past Medical History:  Diagnosis Date  . Abnormal Pap smear of vagina 09/2006   Dr Nori Riis  . Abnormality of gait 05/24/2015   PMH R occipital infarct B12 deficiency H/o polio affected left leg  In Physical Therapy to improve balance & strength through [facility]  . Arthritis 12/25/2015  . Cervicalgia 01/17/2009   MRI 03/27/16:  IMPRESSION: 1. At C3-4 there is a mild broad-based disc bulge. Moderate bilateral facet arthropathy and uncovertebral degenerative changes resulting in moderate bilateral foraminal stenosis. 2. At C5-6 there is a broad-based disc osteophyte complex. Bilateral uncovertebral degenerative changes and facet arthropathy resulting in moderate bilateral foraminal stenosis.  . Fall 09/11/2017  . History of CVA  (cerebrovascular accident) 07/20/2012   Chronic R occipital infarct ; stable on CT 05/21/2012 .Dr Jannifer Franklin , Neurology   . History of TIA (transient ischemic attack) 01/26/2008  . HYPERLIPIDEMIA 01/26/2008   NMR Lipoprofile 2009: LDL 135 (1704/911), HDL 62, TG 131. LDL goal = < 100; ideally < 70. MGF MI in 48s No FH CVA  . Hypothyroidism   . Intention tremor 12/08/2013   Onset after fall 04/2012   . Irritable bowel syndrome (IBS) 01/17/2009   Dr. Unice Cobble  . Osteopenia    PMH fracture heel, wrist  . Parkinson disease ([place]) 06/25/2016   Dr Tat  . Pernicious anemia   . Post-polio syndrome    minor problems L leg  . Restless leg syndrome 12/25/2015   Dr Tat  . Subdural hygroma 09/11/2017  . Swallowing disorder 03/04/2017  . Urinary incontinence 09/07/2008   Dr McDiarmid, Urology   . Vitamin B12 deficiency   . Weakness 06/24/2017    Patient Active Problem List   Diagnosis Date Noted  . Poor posture 04/28/2018  . Physical deconditioning 04/28/2018  . Anxiety 03/02/2018  . Urinary frequency 03/02/2018  . Foot pain, bilateral 12/21/2017  . Bilateral leg edema 12/02/2017  . Overactive bladder 09/19/2017  . Post-polio syndrome   . Vitamin B12 deficiency   . Fall 09/11/2017  . Subdural hygroma 09/11/2017  . UTI (urinary tract infection) 06/24/2017  . Swallowing disorder 03/04/2017  . Parkinson disease ([place]) 06/25/2016  . Restless leg syndrome  12/25/2015  . Arthritis 12/25/2015  . Abnormality of gait 05/24/2015  . History of CVA (cerebrovascular accident) 07/20/2012  . Cervicalgia 01/17/2009  . Irritable bowel syndrome (IBS) 01/17/2009  . Urinary incontinence 09/07/2008  . Anemia 04/26/2008  . HYPERLIPIDEMIA 01/26/2008  . History of TIA (transient ischemic attack) 01/26/2008  . Hypothyroidism 01/04/2008  . Osteopenia 01/04/2008  . Abnormal Pap smear of vagina 09/24/2006    Past Surgical History:  Procedure Laterality Date  . BLADDER SUSPENSION  2000  . CATARACT EXTRACTION,  BILATERAL     Dr. Gershon Crane  . no colonoscopy     "I never felt I needed one "  . YAG LASER APPLICATION Left    Dr. Gershon Crane     OB History   None      Home Medications    Prior to Admission medications   Medication Sig Start Date End Date Taking? Authorizing Provider  aspirin 81 MG tablet Take 81 mg by mouth daily.      [provider]  Calcium Carbonate-Vitamin D (CALCIUM + D PO) Take 1 tablet by mouth daily. Reported on 12/25/2015    [provider]  Carbidopa-Levodopa ER (RYTARY) 36.25-145 MG CPCR Take 1 tablet by mouth 3 (three) times daily. 11/10/17   Tat, Eustace Quail, DO  cholecalciferol (VITAMIN D) 1000 UNITS tablet Take 1,000 Units by mouth daily.      [provider]  furosemide (LASIX) 20 MG tablet Take 0.5 tablets (10 mg total) by mouth daily. 04/28/18   Binnie Rail, MD  levothyroxine (SYNTHROID, LEVOTHROID) 125 MCG tablet Take 1 tablet (125 mcg total) by mouth daily. 04/30/18   Binnie Rail, MD  pramipexole (MIRAPEX) 0.125 MG tablet Take 1 tablet (0.125 mg total) by mouth at bedtime. 11/10/17   Tat, Eustace Quail, DO  Simethicone (GAS-X EXTRA STRENGTH) 125 MG CAPS Take 125 mg by mouth daily as needed (gas).    [provider]  vitamin B-12 (CYANOCOBALAMIN) 1000 MCG tablet Take 1 tablet (1,000 mcg total) by mouth daily. 08/06/16   Binnie Rail, MD    Family History Family History  Problem Relation Age of Onset  . Heart attack Maternal Grandfather        70s  . Emphysema Mother   . Hypertension Mother   . Alzheimer's disease Father   . Breast cancer Maternal Grandmother   . Cancer Maternal Grandmother        breast  . Diabetes Neg Hx   . Stroke Neg Hx   . Parkinson's disease Neg Hx     Social History Social History   Tobacco Use  . Smoking status: Never Smoker  . Smokeless tobacco: Never Used  Substance Use Topics  . Alcohol use: Yes    Alcohol/week: 0.6 oz    Types: 1 Glasses of wine per week    Comment: 0.6 oz per week    . Drug use: No     Allergies   Atorvastatin; Crestor [rosuvastatin calcium]; and Vesicare [solifenacin]   Review of Systems Review of Systems  Constitutional: Negative for chills and fever.  HENT: Negative for sore throat.   Eyes: Negative for pain.  Respiratory: Negative for cough.   Cardiovascular: Negative for chest pain.  Gastrointestinal: Negative for abdominal pain.  Genitourinary: Negative for dysuria.  Musculoskeletal: Negative for back pain and neck pain.  Skin: Positive for wound.  Neurological: Negative for syncope and headaches.  All other systems reviewed and are negative.    Physical Exam Updated  Vital Signs BP 135/72 (BP Location: Right Arm)   Pulse 81   Temp 97.7 F (36.5 C) (Oral)   Resp 18   SpO2 98%   Physical Exam  Constitutional: She appears well-developed and well-nourished.  HENT:  Head: Normocephalic and atraumatic.  Eyes: Conjunctivae are normal.  Neck: Neck supple.  Cardiovascular: Normal rate and regular rhythm.  Pulmonary/Chest: Effort normal. She has no wheezes.  Abdominal: Soft. There is no tenderness. There is no guarding.  Musculoskeletal:  She has some ecchymosis over her dorsum of her left hand and wrist.  Approximately 4 cm V-shaped laceration over the dorsum of her left wrist.  She has some chronic deformity of the wrist but does not have any pain with range of motion.  Distal cap refill sensory and motor intact.  Other extremities full range of motion without any pain.  Neurological: She is alert. GCS eye subscore is 4. GCS verbal subscore is 5. GCS motor subscore is 6.  Skin: Skin is warm and dry.  Psychiatric: She has a normal mood and affect.     ED Treatments / Results  Labs (all labs ordered are listed, but only abnormal results are displayed) Labs Reviewed  BASIC METABOLIC PANEL - Abnormal; Notable for the following components:      Result Value   Potassium 3.3 (*)    BUN 25 (*)    Calcium 8.3 (*)    All other  components within normal limits  CBC WITH DIFFERENTIAL/PLATELET - Abnormal; Notable for the following components:   RBC 3.73 (*)    Hemoglobin 10.6 (*)    HCT 31.8 (*)    RDW 15.7 (*)    All other components within normal limits  URINALYSIS, ROUTINE W REFLEX MICROSCOPIC - Abnormal; Notable for the following components:   Hgb urine dipstick SMALL (*)    Ketones, ur 5 (*)    Protein, ur 30 (*)    Bacteria, UA RARE (*)    All other components within normal limits  TROPONIN I    EKG EKG Interpretation  Date/Time:  Sunday May 23 2018 12:10:40 EDT Ventricular Rate:  75 PR Interval:    QRS Duration: 91 QT Interval:  385 QTC Calculation: 430 R Axis:   -2 Text Interpretation:  Sinus rhythm Atrial premature complex Consider anterior infarct Repol abnrm suggests ischemia, lateral leads compared with prior, no pvcs now, sl ischemic changes lateral -  1/19 Confirmed by Aletta Edouard [phone]) on 05/23/2018 12:26:38 PM Also confirmed by Aletta Edouard [phone]), editor [place], Jeannetta Nap [phone])  on 05/24/2018 3:51:05 PM   Radiology Dg Chest 1 View  Result Date: 05/23/2018 CLINICAL DATA:  Weakness.  Fall. EXAM: CHEST  1 VIEW COMPARISON:  Chest radiograph 11/27/2017 FINDINGS: Stable enlarged cardiac and mediastinal contours. No consolidative pulmonary opacities. No pleural effusion or pneumothorax. IMPRESSION: No acute cardiopulmonary process. Electronically Signed   By: Lovey Newcomer M.D.   On: 05/23/2018 12:46   Dg Wrist Complete Left  Result Date: 05/23/2018 CLINICAL DATA:  Fall this morning with right wrist pain. Initial encounter. EXAM: LEFT WRIST - COMPLETE 3+ VIEW COMPARISON:  None. FINDINGS: Irregularity of the triquetrum on the navicular view, but not persistent on the other views or hand study. Mild dorsal lunate tilting as described on hand radiograph. Osteopenia, chondrocalcinosis, advanced first CMC osteoarthritis. IMPRESSION: 1. Triquetrum irregularity on the single view that is favored  projectional. If there is persistent wrist pain, recommend follow-up radiographs or CT. 2. Soft tissue swelling. 3. Osteopenia and chondrocalcinosis.  Electronically Signed   By: Monte Fantasia M.D.   On: 05/23/2018 10:53   Ct Head Wo Contrast  Result Date: 05/23/2018 CLINICAL DATA:  Patient status post fall. No reported loss of consciousness. EXAM: CT HEAD WITHOUT CONTRAST TECHNIQUE: Contiguous axial images were obtained from the base of the skull through the vertex without intravenous contrast. COMPARISON:  Brain CT 09/11/2017 FINDINGS: Brain: Ventricles and sulci are prominent compatible with atrophy. Periventricular and subcortical white matter hypodensity compatible with chronic microvascular ischemic changes. Right occipital lobe encephalomalacia. Old basal ganglia lacunar infarcts bilaterally. No evidence for acute cortically based infarct, intracranial hemorrhage, mass lesion or mass-effect. Vascular: Internal carotid arterial vascular calcifications. Skull: Intact. Sinuses/Orbits: Paranasal sinuses are well aerated. Mastoid air cells unremarkable. Orbits are unremarkable. Other: None. IMPRESSION: No acute intracranial process. Atrophy and chronic microvascular ischemic changes. Electronically Signed   By: Lovey Newcomer M.D.   On: 05/23/2018 12:33   Dg Hand Complete Left  Result Date: 05/23/2018 CLINICAL DATA:  Fall this morning with left wrist injury. Initial encounter. EXAM: LEFT HAND - COMPLETE 3+ VIEW COMPARISON:  None. FINDINGS: Osteopenia, chondrocalcinosis, and severe arthritis at the first [facility], fifth PIP, and 2/3 DIP joints. Erosive changes seen at the arthritic interphalangeal joints. Mild subluxation medially of the fifth PIP joint, likely degenerative. Soft tissue swelling about the wrist. Mild dorsal tilting of the lunate but no scapholunate interval widening. IMPRESSION: 1. Negative for fracture. 2. Chondrocalcinosis, osteopenia, and erosive osteoarthritis. 3. Dorsal tilting of the lunate  without scapholunate interval widening, indeterminate for intercarpal instability. Electronically Signed   By: Monte Fantasia M.D.   On: 05/23/2018 10:50    Procedures .Marland KitchenLaceration Repair Date/Time: 05/23/2018 10:06 AM Performed by: Hayden Rasmussen, MD Authorized by: Hayden Rasmussen, MD   Consent:    Consent obtained:  Verbal   Consent given by:  Patient   Risks discussed:  Infection, pain, poor cosmetic result, poor wound healing and retained foreign body   Alternatives discussed:  No treatment and delayed treatment Anesthesia (see MAR for exact dosages):    Anesthesia method:  Local infiltration   Local anesthetic:  Lidocaine 1% w/o epi Laceration details:    Location:  Shoulder/arm   Shoulder/arm location:  L lower arm   Length (cm):  5 Repair type:    Repair type:  Simple Pre-procedure details:    Preparation:  Patient was prepped and draped in usual sterile fashion Exploration:    Wound extent: no nerve damage noted and no tendon damage noted   Treatment:    Area cleansed with:  Saline   Amount of cleaning:  Standard   Irrigation solution:  Sterile saline Skin repair:    Repair method:  Sutures and Steri-Strips   Suture size:  5-0   Suture material:  Nylon   Suture technique:  Simple interrupted   Number of sutures:  4   Number of Steri-Strips:  2 Approximation:    Approximation:  Loose Post-procedure details:    Dressing:  Non-adherent dressing and sterile dressing   Patient tolerance of procedure:  Tolerated well, no immediate complications Comments:     Wound is mostly skin tear but there was some deeper aspects.  Reapproximated with some sutures and Steri-Strips over the thinner material.   (including critical care time)  Medications Ordered in ED Medications - No data to display   Initial Impression / Assessment and Plan / ED Course  I have reviewed the triage vital signs and the nursing notes.  Pertinent  labs & imaging results that were available  during my care of the patient were reviewed by me and considered in my medical decision making (see chart for details).  Clinical Course as of May 25 954  Sun May 23, 2018  0909 Reviewed prior notes last tetanus was in 2018.  Getting get x-rays to make sure there is no fracture and then will attempt some wound repair although her skin is so thin the sutures may not hold.   [MB]  1101 Xrays were read as a lot of arthritis and degenerative changes but could be a possible fracture in her knees.  Patient had no specific tenderness  and feels her wrist mobility is at baseline.  Patient and neighbor are comfortable with going home and will follow-up with her PCP.  Will return if any worsening pain or any signs of infection.   [MB]  4503 Patient reportedly lives alone but I find it very difficult that she can manage.  We will put in the home  health consult to see if somebody could do a safety check on her at home when these frequent falls.   [MB]  Bryce who is here with the patient states she is usually more alert and interactive and capable of managing her affairs.  It sounds like she lives independently at home and actually plays gravel weekly.  This is very different from how she appears today.  I think is reasonable to work her up getting some labs EKG chest x-ray head CT.  And holding off on discharge until more clear understanding of this.   [MB]  8882 Patient's medical work-up is been fairly unremarkable.  She is got some mild anemia but has been low in the past, urine does not look overtly infected, chest x-ray negative.  Head CT shows some small vessel disease.   neighbors are concerned because she had multiple falls recently.  Discussed with social work and they recommend getting PT OT involved in case she needs to be placed.   [MB]  1508 Reevaluated patient.  She still has not been seen by PT OT but she is adamant that she wants to be discharged.  I expressed my reservations that she has  had multiple falls and she may not be as independent as she thinks she has.  She states she would like to be discharged and she is going to have people staying with her to help support her at home.  She seems to be understanding of her decision and I do not feel I can hold her against her well.  She is not willing to stay for any further testing and her neighbor states she will take her home.  Patient knows she can return if any worsening symptoms.   [MB]    Clinical Course User Index [MB] Hayden Rasmussen, MD     Final Clinical Impressions(s) / ED Diagnoses   Final diagnoses:  Laceration of left wrist, initial encounter  Contusion of left hand, initial encounter  Frequent falls    ED Discharge Orders    None       Hayden Rasmussen, MD 05/25/18 [phone]

## 2018-05-23 NOTE — Clinical Social Work Note (Signed)
Clinical Social Work Assessment  Patient Details  Name: Elizabeth [name] MRN: [id number] Date of Birth: May 28, 1931  Date of referral:  05/23/18               Reason for consult:  Facility Placement                Permission sought to share information with:  Other Permission granted to share information::  Yes, Verbal Permission Granted  Name::     Patient's friend, Judeth Porch, present during assessment. Verbal permission granted.  Agency::     Relationship::     Contact Information:     Housing/Transportation Living arrangements for the past 2 months:  Single Family Home Source of Information:  Patient Patient Interpreter Needed:  None Criminal Activity/Legal Involvement Pertinent to Current Situation/Hospitalization:  No - Comment as needed Significant Relationships:  Friend, Other(Comment)(Reports 2 nephews in [employer]) Lives with:  Self Do you feel safe going back to the place where you live?  Yes Need for family participation in patient care:  No (Coment)  Care giving concerns:  Social Work consult placed for patient who lives alone and fell in home. Patient reportedly has fallen multiple times within the past year. Patient fell at home this morning and was brought to a local fire-station prior to E.D.   Social Worker assessment / plan:  CSW to meet with patient to assess patient's living arrangement for safety and determine if patient would be agreeable to SNF placement if PT recommends.  Patient reports living in her own home for her entire adult life. Patient reports a prior SNF placement at [facility] for 14 days and is currently participating in in-home PT through [facility]. Patient reports strong involvement from family and friends, "too much involvement, someone comes by at least every day."   Patient reports feeling safe and comfortable in her own home. Patient reports being able to dress herself, feed herself, and move throughout her home without assistance. Patient  endorses needing assistance getting in and out of the shower, but her friend Judeth Porch (present at time of assessment), comes by daily. Patient reports driving until recently, but notes that supports will drive her to the grocery store and to appointments.    Employment status:  Retired Training and development officer) PT Recommendations:  Not assessed at this time Information / Referral to community resources:     Patient/Family's Response to care:   Patient and friend appreciative of CSW intervention, declined further services.  Patient/Family's Understanding of and Emotional Response to Diagnosis, Current Treatment, and Prognosis: Patient expressed desire to discharge home as soon as possible and is not interested in pursuing SNF placement. Patient reports high satisfaction with family and friend supports and current outpatient PT in home.   Emotional Assessment Appearance:  Appears stated age Attitude/Demeanor/Rapport:    Affect (typically observed):  Accepting, Pleasant, Calm Orientation:  Oriented to Self, Oriented to Place, Oriented to Situation, Oriented to  Time Alcohol / Substance use:    Psych involvement (Current and /or in the community):  No (Comment)  Discharge Needs  Concerns to be addressed:  No discharge needs identified Readmission within the last 30 days:  No Current discharge risk:  Lives alone(Patient lives alone but has daily supports from friends and family members. Patient also reports in home PT/OT "several days a week.") Barriers to Discharge:  No Barriers Identified    Joellen Jersey, [place] 05/23/2018, 3:39 PM

## 2018-05-23 NOTE — ED Triage Notes (Signed)
Patient just discharged. Refused to stay for treatment. Now states "maybe I should stay" "put me on the lunch menu".

## 2018-05-24 DIAGNOSIS — G2581 Restless legs syndrome: Secondary | ICD-10-CM | POA: Diagnosis not present

## 2018-05-24 DIAGNOSIS — G252 Other specified forms of tremor: Secondary | ICD-10-CM | POA: Diagnosis not present

## 2018-05-24 DIAGNOSIS — R2681 Unsteadiness on feet: Secondary | ICD-10-CM | POA: Diagnosis not present

## 2018-05-24 DIAGNOSIS — D649 Anemia, unspecified: Secondary | ICD-10-CM | POA: Diagnosis not present

## 2018-05-24 DIAGNOSIS — M1991 Primary osteoarthritis, unspecified site: Secondary | ICD-10-CM | POA: Diagnosis not present

## 2018-05-24 DIAGNOSIS — E785 Hyperlipidemia, unspecified: Secondary | ICD-10-CM | POA: Diagnosis not present

## 2018-05-24 DIAGNOSIS — G960 Cerebrospinal fluid leak: Secondary | ICD-10-CM | POA: Diagnosis not present

## 2018-05-24 DIAGNOSIS — N390 Urinary tract infection, site not specified: Secondary | ICD-10-CM | POA: Diagnosis not present

## 2018-05-24 DIAGNOSIS — M6281 Muscle weakness (generalized): Secondary | ICD-10-CM | POA: Diagnosis not present

## 2018-05-24 DIAGNOSIS — D510 Vitamin B12 deficiency anemia due to intrinsic factor deficiency: Secondary | ICD-10-CM | POA: Diagnosis not present

## 2018-05-24 DIAGNOSIS — R488 Other symbolic dysfunctions: Secondary | ICD-10-CM | POA: Diagnosis not present

## 2018-05-24 DIAGNOSIS — Z7982 Long term (current) use of aspirin: Secondary | ICD-10-CM | POA: Diagnosis not present

## 2018-05-24 DIAGNOSIS — G20 Parkinson's disease: Secondary | ICD-10-CM | POA: Diagnosis not present

## 2018-05-24 DIAGNOSIS — M542 Cervicalgia: Secondary | ICD-10-CM | POA: Diagnosis not present

## 2018-05-24 DIAGNOSIS — G14 Postpolio syndrome: Secondary | ICD-10-CM | POA: Diagnosis not present

## 2018-05-24 DIAGNOSIS — S61512D Laceration without foreign body of left wrist, subsequent encounter: Secondary | ICD-10-CM | POA: Diagnosis not present

## 2018-05-24 DIAGNOSIS — E039 Hypothyroidism, unspecified: Secondary | ICD-10-CM | POA: Diagnosis not present

## 2018-05-24 DIAGNOSIS — E538 Deficiency of other specified B group vitamins: Secondary | ICD-10-CM | POA: Diagnosis not present

## 2018-05-24 DIAGNOSIS — M810 Age-related osteoporosis without current pathological fracture: Secondary | ICD-10-CM | POA: Diagnosis not present

## 2018-05-24 DIAGNOSIS — R531 Weakness: Secondary | ICD-10-CM | POA: Diagnosis not present

## 2018-05-24 DIAGNOSIS — Z79899 Other long term (current) drug therapy: Secondary | ICD-10-CM | POA: Diagnosis not present

## 2018-05-24 DIAGNOSIS — Z9181 History of falling: Secondary | ICD-10-CM | POA: Diagnosis not present

## 2018-05-24 NOTE — Evaluation (Signed)
Occupational Therapy Evaluation Patient Details Name: Elizabeth [name] MRN: [id number] DOB: [birth year] Today's Date: 05/24/2018    History of Present Illness Elizabeth [name] is a 84 y.o. female with history of stroke, hypothyroidism, Parkinson's Disease,RLS, pernicious anemia post polio syndrome brough to ED 05/23/18 after a fall and injuring the left wrist. Negative for fracture. Was [place] home but unable to  get into car per report. Now for placement to SNF.   Clinical Impression   Pt admitted with fall. Pt currently with functional limitations due to the deficits listed below (see OT Problem List).  Pt will benefit from skilled OT to increase their safety and independence with ADL and functional mobility for ADL to facilitate discharge to venue listed below.      Follow Up Recommendations  SNF    Equipment Recommendations  Other (comment)    Recommendations for Other Services       Precautions / Restrictions Precautions Precautions: Fall      Mobility Bed Mobility Overal bed mobility: Needs Assistance Bed Mobility: Supine to Sit;Sit to Supine     Supine to sit: Min assist Sit to supine: Mod assist   General bed mobility comments: extra time. assist  legs onto bed  Transfers Overall transfer level: Needs assistance   Transfers: Sit to/from Stand;Stand Pivot Transfers Sit to Stand: Max assist Stand pivot transfers: Max assist       General transfer comment: extra tome , steady assist to rise from bed and BSC, support at trunk  for standing and pivoting to [facility] then back to the bed. Patient is able to weight bear on legs    Balance Overall balance assessment: Needs assistance;History of Falls Sitting-balance support: Feet supported;Bilateral upper extremity supported Sitting balance-Leahy Scale: Fair Sitting balance - Comments: significant kyphosis and sits very flexed   Standing balance support: Bilateral upper extremity supported;During functional activity Standing  balance-Leahy Scale: Poor Standing balance comment: relies on UE support                           ADL either performed or assessed with clinical judgement   ADL Overall ADL's : Needs assistance/impaired Eating/Feeding: Minimal assistance;Sitting   Grooming: Minimal assistance;Sitting   Upper Body Bathing: Moderate assistance;Standing   Lower Body Bathing: Maximal assistance;Sit to/from stand;Cueing for safety;Cueing for sequencing   Upper Body Dressing : Moderate assistance;Sitting   Lower Body Dressing: Sit to/from stand   Toilet Transfer: Maximal assistance;BSC;+2 for safety/equipment;+2 for physical assistance   Toileting- Clothing Manipulation and Hygiene: Maximal assistance;Sit to/from stand;+2 for physical assistance;+2 for safety/equipment         General ADL Comments: pt will benefit from SNF.  Pt does not have A at home     Vision Patient Visual Report: No change from baseline              Pertinent Vitals/Pain Pain Assessment: Faces Faces Pain Scale: Hurts little more Pain Location: left hand Pain Descriptors / Indicators: Tender;Sore Pain Intervention(s): Limited activity within patient's tolerance;Monitored during session     Hand Dominance     Extremity/Trunk Assessment Upper Extremity Assessment Upper Extremity Assessment: Generalized weakness;LUE deficits/detail LUE Deficits / Details: wrist and hand  with edema and bruising. L hand edema   Lower Extremity Assessment Lower Extremity Assessment: Generalized weakness   Cervical / Trunk Assessment Cervical / Trunk Assessment: Kyphotic(severe)   Communication Communication Communication: No difficulties   Cognition Arousal/Alertness: Awake/alert Behavior During Therapy: WFL for tasks  assessed/performed Overall Cognitive Status: Within Functional Limits for tasks assessed                                 General Comments: some difficulty understanding the process for  SNF placemwent   General Comments               Home Living Family/patient expects to be discharged to:: Skilled nursing facility                                        Prior Functioning/Environment Level of Independence: Needs assistance  Gait / Transfers Assistance Needed: uses Rollator              OT Problem List: Decreased strength;Decreased activity tolerance;Impaired balance (sitting and/or standing);Decreased knowledge of precautions;Decreased safety awareness;Decreased knowledge of use of DME or AE      OT Treatment/Interventions: Self-care/ADL training;Patient/family education;DME and/or AE instruction;Therapeutic activities    OT Goals(Current goals can be found in the care plan section) Acute Rehab OT Goals Patient Stated Goal: agrees with SNF, to go to [facility]  OT Frequency: Min 2X/week   Barriers to D/C: Decreased caregiver support             AM-PAC PT "6 Clicks" Daily Activity     Outcome Measure Help from another person eating meals?: A Little Help from another person taking care of personal grooming?: A Little Help from another person toileting, which includes using toliet, bedpan, or urinal?: A Lot Help from another person bathing (including washing, rinsing, drying)?: A Lot Help from another person to put on and taking off regular upper body clothing?: A Little Help from another person to put on and taking off regular lower body clothing?: A Lot 6 Click Score: 15   End of Session Nurse Communication: Mobility status;Other (comment)(elevation LUE for hand edema)  Activity Tolerance: Patient limited by fatigue Patient left: in bed;with call bell/phone within reach  OT Visit Diagnosis: Unsteadiness on feet (R26.81);Other abnormalities of gait and mobility (R26.89);History of falling (Z91.81);Repeated falls (R29.6);Muscle weakness (generalized) (M62.81)                  Charges:  OT Evaluation $OT Eval Moderate Complexity: 1  Mod G-Codes:     Kari Baars, [place]  Betsy Pries 05/24/2018, 11:39 AM

## 2018-05-24 NOTE — Progress Notes (Signed)
Patient has been accepted to [facility] for today 7/1. Patient, RN and EDP agreeable to discharge plan. CSW briefly spoke to nephew, Heron Sabins, regarding discharge plans, [place] will be back in town for assistance tomorrow if needed. Please call report to [phone]. [facility] has been called for transportation.   No further social work needs.  Ollen Barges, [place] Work Department  [employer]  [phone]

## 2018-05-24 NOTE — Progress Notes (Addendum)
Update 9AM: CSW spoke with patient via bedside to confirm patient is agreeable to SNF placement. Patient still states she would prefer to discharge home if is she able to get [facility]. CSW informed patient the EDP had consulted PT/OT to determine level of care needed- patient is agreeable to have PT evaluation before determining plans. Patient also states her friends are visiting her house today to determine how bad her water leak is. Friends are to visit patient at [place] will continue to follow up.   CSW aware patient pending placement. CSW will follow up after PT evaluation.   Kingsley Spittle, LCSW Emergency Room Clinical Social Worker [phone]

## 2018-05-24 NOTE — ED Notes (Signed)
Report called to [facility].  Patient to be transported by [facility].

## 2018-05-24 NOTE — ED Provider Notes (Signed)
Pt has been cleared for NH placement Pt. W/o c/o today   Lacretia Leigh, MD 05/24/18 1351

## 2018-05-24 NOTE — NC FL2 (Signed)
[place] LEVEL OF CARE SCREENING TOOL     IDENTIFICATION  Patient Name: Elizabeth [name] Birthdate: 11/11/1931 Sex: female Admission Date (Current Location): 05/23/2018  [facility] and [place] Number:  Herbalist and Address:  [facility],  [place] [address] [place]      Provider Number: [id number]  Attending Physician Name and Address:  Default, Provider, MD  Relative Name and Phone Number:       Current Level of Care: Hospital Recommended Level of Care: [place] Prior Approval Number:    Date Approved/Denied:   PASRR Number:  [id number]  Discharge Plan: SNF    Current Diagnoses: Patient Active Problem List   Diagnosis Date Noted  . Poor posture 04/28/2018  . Physical deconditioning 04/28/2018  . Anxiety 03/02/2018  . Urinary frequency 03/02/2018  . Foot pain, bilateral 12/21/2017  . Bilateral leg edema 12/02/2017  . Overactive bladder 09/19/2017  . Post-polio syndrome   . Vitamin B12 deficiency   . Fall 09/11/2017  . Subdural hygroma 09/11/2017  . UTI (urinary tract infection) 06/24/2017  . Swallowing disorder 03/04/2017  . Parkinson disease ([place]) 06/25/2016  . Restless leg syndrome 12/25/2015  . Arthritis 12/25/2015  . Abnormality of gait 05/24/2015  . History of CVA (cerebrovascular accident) 07/20/2012  . Cervicalgia 01/17/2009  . Irritable bowel syndrome (IBS) 01/17/2009  . Urinary incontinence 09/07/2008  . Anemia 04/26/2008  . HYPERLIPIDEMIA 01/26/2008  . History of TIA (transient ischemic attack) 01/26/2008  . Hypothyroidism 01/04/2008  . Osteopenia 01/04/2008  . Abnormal Pap smear of vagina 09/24/2006    Orientation RESPIRATION BLADDER Height & Weight     Self, Time, Situation, Place  Normal Continent Weight: 96 lb (43.5 kg) Height:  5' (152.4 cm)  BEHAVIORAL SYMPTOMS/MOOD NEUROLOGICAL BOWEL NUTRITION STATUS      Continent Diet(Regular)  AMBULATORY STATUS COMMUNICATION OF NEEDS Skin    Extensive Assist Verbally Normal(Hand bruising from fall)                       Personal Care Assistance Level of Assistance  Bathing, Feeding, Dressing Bathing Assistance: Maximum assistance Feeding assistance: Independent Dressing Assistance: Maximum assistance     Functional Limitations Info             SPECIAL CARE FACTORS FREQUENCY  PT (By licensed PT), OT (By licensed OT)     PT Frequency: 5 OT Frequency: 5            Contractures      Additional Factors Info  Code Status, Allergies Code Status Info: Full Allergies Info: Atorvastatin, Crestor, Vesicare           Current Medications (05/24/2018):  This is the current hospital active medication list Current Facility-Administered Medications  Medication Dose Route Frequency Provider Last Rate Last Dose  . levothyroxine (SYNTHROID, LEVOTHROID) tablet 125 mcg  125 mcg Oral QAC breakfast Julianne Rice, MD   125 mcg at 05/24/18 0900  . pramipexole (MIRAPEX) tablet 0.125 mg  0.125 mg Oral QHS Julianne Rice, MD   0.125 mg at 05/23/18 2252   Current Outpatient Medications  Medication Sig Dispense Refill  . Carbidopa-Levodopa ER (RYTARY) 36.25-145 MG CPCR Take 1 tablet by mouth 3 (three) times daily. 90 capsule 3  . pramipexole (MIRAPEX) 0.125 MG tablet Take 1 tablet (0.125 mg total) by mouth at bedtime. 30 tablet 3  . Probiotic Product (PROBIOTIC PO) Take 1 capsule by mouth daily.    . vitamin  B-12 (CYANOCOBALAMIN) 1000 MCG tablet Take 1 tablet (1,000 mcg total) by mouth daily. 90 tablet 3  . aspirin 81 MG tablet Take 81 mg by mouth daily.      . Calcium Carbonate-Vitamin D (CALCIUM + D PO) Take 1 tablet by mouth daily. Reported on 12/25/2015    . furosemide (LASIX) 20 MG tablet Take 0.5 tablets (10 mg total) by mouth daily. 30 tablet   . levothyroxine (SYNTHROID, LEVOTHROID) 125 MCG tablet Take 1 tablet (125 mcg total) by mouth daily. 90 tablet 1  . permethrin (ELIMITE) 5 % cream Apply 1 application  topically daily.    . Simethicone (GAS-X EXTRA STRENGTH) 125 MG CAPS Take 125 mg by mouth daily as needed (gas).       Discharge Medications: Please see discharge summary for a list of discharge medications.  Relevant Imaging Results:  Relevant Lab Results:   Additional Information [id number]  Ollen Barges, LCSW

## 2018-05-24 NOTE — Evaluation (Signed)
Physical Therapy Evaluation Patient Details Name: Elizabeth [name] MRN: [id number] DOB: [birth year] Today's Date: 05/24/2018   History of Present Illness  Nejla Reasor Kraner is a 84 y.o. female with history of stroke, hypothyroidism, Parkinson's Disease,RLS, pernicious anemia post polio syndrome brough to ED 05/23/18 after a fall and injuring the left wrist. Negative for fracture. Was [place] home but unable to  get into car per report. Now for placement to SNF.  Clinical Impression  The patient requires max assist of one to stand and pivot to [facility] and bed. The patient will benefit from SNF. Pt admitted with above diagnosis. Pt currently with functional limitations due to the deficits listed below (see PT Problem List).  Pt will benefit from skilled PT to increase their independence and safety with mobility to allow discharge to the venue listed below.       Follow Up Recommendations SNF    Equipment Recommendations  None recommended by PT    Recommendations for Other Services       Precautions / Restrictions Precautions Precautions: Fall      Mobility  Bed Mobility Overal bed mobility: Needs Assistance Bed Mobility: Supine to Sit;Sit to Supine     Supine to sit: Min assist Sit to supine: Mod assist   General bed mobility comments: extra time. assist  legs onto bed  Transfers Overall transfer level: Needs assistance   Transfers: Sit to/from Stand;Stand Pivot Transfers Sit to Stand: Max assist Stand pivot transfers: Max assist       General transfer comment: extra tome , steady assist to rise from bed and BSC, support at trunk  for standing and pivoting to [facility] then back to the bed. Patient is able to weight bear on legs  Ambulation/Gait                Stairs            Wheelchair Mobility    Modified Rankin (Stroke Patients Only)       Balance Overall balance assessment: Needs assistance;History of Falls Sitting-balance support: Feet supported;Bilateral upper  extremity supported Sitting balance-Leahy Scale: Fair Sitting balance - Comments: significant kyphosis and sits very flexed   Standing balance support: Bilateral upper extremity supported;During functional activity Standing balance-Leahy Scale: Poor Standing balance comment: relies on UE support                             Pertinent Vitals/Pain Pain Assessment: Faces Faces Pain Scale: Hurts little more Pain Location: left hand Pain Intervention(s): Monitored during session    Home Living Family/patient expects to be discharged to:: Skilled nursing facility                      Prior Function Level of Independence: Needs assistance   Gait / Transfers Assistance Needed: uses Rollator           Hand Dominance        Extremity/Trunk Assessment   Upper Extremity Assessment Upper Extremity Assessment: Generalized weakness;LUE deficits/detail LUE Deficits / Details: wrist and hand  with edema and bruising    Lower Extremity Assessment Lower Extremity Assessment: Generalized weakness    Cervical / Trunk Assessment Cervical / Trunk Assessment: Kyphotic(severe)  Communication   Communication: No difficulties  Cognition Arousal/Alertness: Awake/alert Behavior During Therapy: WFL for tasks assessed/performed Overall Cognitive Status: Within Functional Limits for tasks assessed  General Comments: some difficulty understanding the process for SNF placemwent      General Comments      Exercises     Assessment/Plan    PT Assessment Patient needs continued PT services  PT Problem List Decreased strength;Decreased range of motion;Decreased knowledge of use of DME;Decreased activity tolerance;Decreased safety awareness;Decreased balance;Decreased knowledge of precautions;Decreased mobility       PT Treatment Interventions DME instruction;Therapeutic exercise;Gait training;Balance training;Functional  mobility training;Therapeutic activities;Patient/family education    PT Goals (Current goals can be found in the Care Plan section)  Acute Rehab PT Goals Patient Stated Goal: agrees with SNF, to go to [facility] PT Goal Formulation: With patient Time For Goal Achievement: 06/07/18 Potential to Achieve Goals: Good    Frequency Min 2X/week   Barriers to discharge Decreased caregiver support      Co-evaluation               AM-PAC PT "6 Clicks" Daily Activity  Outcome Measure Difficulty turning over in bed (including adjusting bedclothes, sheets and blankets)?: Unable Difficulty moving from lying on back to sitting on the side of the bed? : Unable Difficulty sitting down on and standing up from a chair with arms (e.g., wheelchair, bedside commode, etc,.)?: Unable Help needed moving to and from a bed to chair (including a wheelchair)?: Total Help needed walking in hospital room?: Total Help needed climbing 3-5 steps with a railing? : Total 6 Click Score: 6    End of Session   Activity Tolerance: Patient tolerated treatment well Patient left: in bed;with call bell/phone within reach;with bed alarm set Nurse Communication: Mobility status PT Visit Diagnosis: Unsteadiness on feet (R26.81);Repeated falls (R29.6)    Time: 3875-6433 PT Time Calculation (min) (ACUTE ONLY): 24 min   Charges:   PT Evaluation $PT Eval Low Complexity: 1 Low PT Treatments $Therapeutic Activity: 8-22 mins   PT G CodesTresa Endo PT [phone]   Claretha Cooper 05/24/2018, 9:55 AM

## 2018-05-24 NOTE — Progress Notes (Signed)
CSW received a call from [place] at [facility] in admissions at ph: [phone] who was requesting ETA of pt from the [facility] ED to [facility].  CSW called PT[facility] and was informed that [facility] should arrive to the [facility] ED at approx 5:15pm.  CSW relayed this to [facility] and updated pt's RN in the [facility] ED TCU.  CSW will continue to follow for D/C needs.  Alphonse Guild. Tamaiya Bump, LCSW, LCAS, CSI Clinical Social Worker Ph: [phone]

## 2018-05-25 ENCOUNTER — Non-Acute Institutional Stay (SKILLED_NURSING_FACILITY): Payer: Medicare Other | Admitting: Internal Medicine

## 2018-05-25 ENCOUNTER — Encounter: Payer: Self-pay | Admitting: Internal Medicine

## 2018-05-25 DIAGNOSIS — G2581 Restless legs syndrome: Secondary | ICD-10-CM

## 2018-05-25 DIAGNOSIS — G20 Parkinson's disease: Secondary | ICD-10-CM | POA: Diagnosis not present

## 2018-05-25 DIAGNOSIS — S61512D Laceration without foreign body of left wrist, subsequent encounter: Secondary | ICD-10-CM

## 2018-05-25 DIAGNOSIS — E039 Hypothyroidism, unspecified: Secondary | ICD-10-CM

## 2018-05-25 DIAGNOSIS — R531 Weakness: Secondary | ICD-10-CM | POA: Diagnosis not present

## 2018-05-25 DIAGNOSIS — M810 Age-related osteoporosis without current pathological fracture: Secondary | ICD-10-CM | POA: Diagnosis not present

## 2018-05-25 NOTE — Progress Notes (Signed)
Location:  [facility] Room Number: 105-P Place of Service:  SNF (31)  Elizabeth Delaine. Sheppard Coil, MD  Patient Care Team: Binnie Rail, MD as PCP - General (Internal Medicine) Tat, Eustace Quail, DO as Consulting Physician (Neurology) Rutherford Guys, MD as Consulting Physician (Ophthalmology)  Extended Emergency Contact Information Primary Emergency Contact: [place]ouli[place],Joey & Jesusita Oka [place] of [place] Phone: [phone] Mobile Phone: [phone] Relation: Nephew Secondary Emergency Contact: Grafton Folk [place] of [place] Phone: [phone] Relation: Friend    Allergies: Atorvastatin; Crestor [rosuvastatin calcium]; and Vesicare [solifenacin]  Chief Complaint  Patient presents with  . New Admit To SNF    Admit to [facility]    HPI: Patient is 84 y.o. female with Parkinson's disease, restless leg syndrome, hypothyroidism, hyperlipidemia, anxiety, who was admitted to skilled nursing facility from [facility] emergency department on 05/24/2018 for generalized weakness.  She was seen in the emergency department on 6/30 for a fall in which she injured her wrist and which required sutures.  Patient was driven home and could not get out of her car due to weakness so was brought back to the emergency department.  Patient had had labs done on her initial visit which showed potassium slightly low at 3.3 hemoglobin 10.6, urine with rare bacteria, ECG with no acute findings.  Patient with multiple falls over the preceding months.  PT/OT evaluated patient at the emergency department and had recommended admission to skilled nursing facility but patient had declined.  However when she could not get out of the car without help patient agreed to be admitted to skilled nursing facility.  While at skilled nursing facility patient will be followed for Parkinson's disease treated with carbidopa levodopa, restless leg syndrome treated with Mirapex and hypothyroidism  treated with Synthroid.  Past Medical History:  Diagnosis Date  . Abnormal Pap smear of vagina 09/2006   Dr Nori Riis  . Abnormality of gait 05/24/2015   PMH R occipital infarct B12 deficiency H/o polio affected left leg  In Physical Therapy to improve balance & strength through [facility]  . Arthritis 12/25/2015  . Cervicalgia 01/17/2009   MRI 03/27/16:  IMPRESSION: 1. At C3-4 there is a mild broad-based disc bulge. Moderate bilateral facet arthropathy and uncovertebral degenerative changes resulting in moderate bilateral foraminal stenosis. 2. At C5-6 there is a broad-based disc osteophyte complex. Bilateral uncovertebral degenerative changes and facet arthropathy resulting in moderate bilateral foraminal stenosis.  . Fall 09/11/2017  . History of CVA (cerebrovascular accident) 07/20/2012   Chronic R occipital infarct ; stable on CT 05/21/2012 .Dr Jannifer Franklin , Neurology   . History of TIA (transient ischemic attack) 01/26/2008  . HYPERLIPIDEMIA 01/26/2008   NMR Lipoprofile 2009: LDL 135 (1704/911), HDL 62, TG 131. LDL goal = < 100; ideally < 70. MGF MI in 51s No FH CVA  . Hypothyroidism   . Intention tremor 12/08/2013   Onset after fall 04/2012   . Irritable bowel syndrome (IBS) 01/17/2009   Dr. Unice Cobble  . Osteopenia    PMH fracture heel, wrist  . Parkinson disease ([place]) 06/25/2016   Dr Tat  . Pernicious anemia   . Post-polio syndrome    minor problems L leg  . Restless leg syndrome 12/25/2015   Dr Tat  . Subdural hygroma 09/11/2017  . Swallowing disorder 03/04/2017  . Urinary incontinence 09/07/2008   Dr McDiarmid, Urology   . Vitamin B12 deficiency   . Weakness 06/24/2017    Past  Surgical History:  Procedure Laterality Date  . BLADDER SUSPENSION  2000  . CATARACT EXTRACTION, BILATERAL     Dr. Gershon Crane  . no colonoscopy     "I never felt I needed one "  . YAG LASER APPLICATION Left    Dr. Gershon Crane    Allergies as of 05/25/2018      Reactions   Atorvastatin Other (See Comments)    REACTION: Felt funny (only way patient could describe)   Crestor [rosuvastatin Calcium] Other (See Comments)   Leg cramps, muscle aches   Vesicare [solifenacin]    constipation      Medication List        Accurate as of 05/25/18 10:10 AM. Always use your most recent med list.          aspirin 81 MG tablet Take 81 mg by mouth daily.   CALCIUM + D PO Take 1 tablet by mouth daily. Reported on 12/25/2015   Carbidopa-Levodopa ER 36.25-145 MG Cpcr Commonly known as:  RYTARY Take 1 tablet by mouth 3 (three) times daily.   furosemide 20 MG tablet Commonly known as:  LASIX Take 0.5 tablets (10 mg total) by mouth daily.   GAS-X EXTRA STRENGTH 125 MG Caps Generic drug:  Simethicone Take 125 mg by mouth daily as needed (gas).   levothyroxine 125 MCG tablet Commonly known as:  SYNTHROID, LEVOTHROID Take 1 tablet (125 mcg total) by mouth daily.   permethrin 5 % cream Commonly known as:  ELIMITE Apply 1 application topically daily.   pramipexole 0.125 MG tablet Commonly known as:  MIRAPEX Take 1 tablet (0.125 mg total) by mouth at bedtime.   PROBIOTIC PO Take 1 capsule by mouth daily.   vitamin B-12 1000 MCG tablet Commonly known as:  CYANOCOBALAMIN Take 1 tablet (1,000 mcg total) by mouth daily.       No orders of the defined types were placed in this encounter.   Immunization History  Administered Date(s) Administered  . Tdap 03/04/2017, 09/11/2017  . Zoster 01/14/2008    Social History   Tobacco Use  . Smoking status: Never Smoker  . Smokeless tobacco: Never Used  Substance Use Topics  . Alcohol use: Yes    Alcohol/week: 0.6 oz    Types: 1 Glasses of wine per week    Comment: 0.6 oz per week    Review of Systems  DATA OBTAINED: from patient, nurse GENERAL:  no fevers, fatigue, appetite changes SKIN: No itching, rash HEENT: No complaint RESPIRATORY: No cough, wheezing, SOB CARDIAC: No chest pain, palpitations, lower extremity edema  GI: No abdominal  pain, No N/V/D or constipation, No heartburn or reflux  GU: No dysuria, frequency or urgency, or incontinence  MUSCULOSKELETAL: No unrelieved bone/joint pain NEUROLOGIC: No headache, dizziness  PSYCHIATRIC: No overt anxiety or sadness  Vitals:   05/25/18 0958  BP: 134/65  Pulse: 89  Resp: 20  Temp: 98.5 F (36.9 C)   Body mass index is 18.75 kg/m. Physical Exam  GENERAL APPEARANCE: Alert, conversant, No acute distress  SKIN: Sutures left wrist HEENT: Unremarkable RESPIRATORY: Breathing is even, unlabored. Lung sounds are clear   CARDIOVASCULAR: Heart RRR no murmurs, rubs or gallops. No peripheral edema  GASTROINTESTINAL: Abdomen is soft, non-tender, not distended w/ normal bowel sounds.  GENITOURINARY: Bladder non tender, not distended  MUSCULOSKELETAL: No abnormal joints or musculature NEUROLOGIC: Cranial nerves 2-12 grossly intact. Moves all extremities PSYCHIATRIC: Mood and affect appropriate to situation, no behavioral issues  Patient Active Problem List  Diagnosis Date Noted  . Poor posture 04/28/2018  . Physical deconditioning 04/28/2018  . Anxiety 03/02/2018  . Urinary frequency 03/02/2018  . Foot pain, bilateral 12/21/2017  . Bilateral leg edema 12/02/2017  . Overactive bladder 09/19/2017  . Post-polio syndrome   . Vitamin B12 deficiency   . Fall 09/11/2017  . Subdural hygroma 09/11/2017  . UTI (urinary tract infection) 06/24/2017  . Swallowing disorder 03/04/2017  . Parkinson disease ([place]) 06/25/2016  . Restless leg syndrome 12/25/2015  . Arthritis 12/25/2015  . Abnormality of gait 05/24/2015  . History of CVA (cerebrovascular accident) 07/20/2012  . Cervicalgia 01/17/2009  . Irritable bowel syndrome (IBS) 01/17/2009  . Urinary incontinence 09/07/2008  . Anemia 04/26/2008  . HYPERLIPIDEMIA 01/26/2008  . History of TIA (transient ischemic attack) 01/26/2008  . Hypothyroidism 01/04/2008  . Osteopenia 01/04/2008  . Abnormal Pap smear of vagina  09/24/2006    CMP     Component Value Date/Time   NA 143 05/23/2018 1238   K 3.3 (L) 05/23/2018 1238   CL 109 05/23/2018 1238   CO2 26 05/23/2018 1238   GLUCOSE 99 05/23/2018 1238   BUN 25 (H) 05/23/2018 1238   CREATININE 0.84 05/23/2018 1238   CALCIUM 8.3 (L) 05/23/2018 1238   PROT 6.7 04/28/2018 1521   ALBUMIN 3.9 04/28/2018 1521   AST 23 04/28/2018 1521   ALT 2 04/28/2018 1521   ALKPHOS 101 04/28/2018 1521   BILITOT 0.7 04/28/2018 1521   GFRNONAA >60 05/23/2018 1238   GFRAA >60 05/23/2018 1238   Recent Labs    06/13/17 0827  09/11/17 2244  09/13/17 0540  03/02/18 1430 04/28/18 1521 05/23/18 1238  NA  --    < >  --    < > 136   < > 139 140 143  K  --    < >  --    < > 3.3*   < > 3.4* 4.2 3.3*  CL  --    < >  --    < > 103   < > 103 104 109  CO2  --    < >  --    < > 26   < > 30 31 26   GLUCOSE  --    < >  --    < > 104*   < > 93 95 99  BUN  --    < >  --    < > 6   < > 11 21 25*  CREATININE  --    < >  --    < > 0.65   < > 0.69 0.78 0.84  CALCIUM  --    < >  --    < > 7.9*   < > 8.7 9.4 8.3*  MG 2.1  --  1.5*  --  1.7  --   --   --   --    < > = values in this interval not displayed.   Recent Labs    12/02/17 1536 03/02/18 1430 04/28/18 1521  AST 26 30 23   ALT 4 3 2   ALKPHOS 89 92 101  BILITOT 0.9 0.9 0.7  PROT 6.5 6.5 6.7  ALBUMIN 3.9 3.5 3.9   Recent Labs    03/02/18 1430 04/28/18 1521 05/23/18 1238  WBC 4.7 4.7 5.0  NEUTROABS 2.7 2.2 3.3  HGB 10.8* 11.7* 10.6*  HCT 31.9* 35.8* 31.8*  MCV 83.0 85.3 85.3  PLT 246.0 228.0 206   No results for  input(s): CHOL, LDLCALC, TRIG in the last 8760 hours.  Invalid input(s): HCL No results found for: [facility] Lab Results  Component Value Date   TSH 0.316 (L) 05/23/2018   Lab Results  Component Value Date   HGBA1C 5.8 12/14/2009   Lab Results  Component Value Date   CHOL 145 08/06/2016   HDL 55.00 08/06/2016   LDLCALC 61 08/06/2016   LDLDIRECT 120.8 06/10/2010   TRIG 146.0 08/06/2016    CHOLHDL 3 08/06/2016    Significant Diagnostic Results in last 30 days:  Dg Chest 1 View  Result Date: 05/23/2018 CLINICAL DATA:  Weakness.  Fall. EXAM: CHEST  1 VIEW COMPARISON:  Chest radiograph 11/27/2017 FINDINGS: Stable enlarged cardiac and mediastinal contours. No consolidative pulmonary opacities. No pleural effusion or pneumothorax. IMPRESSION: No acute cardiopulmonary process. Electronically Signed   By: Lovey Newcomer M.D.   On: 05/23/2018 12:46   Dg Wrist Complete Left  Result Date: 05/23/2018 CLINICAL DATA:  Fall this morning with right wrist pain. Initial encounter. EXAM: LEFT WRIST - COMPLETE 3+ VIEW COMPARISON:  None. FINDINGS: Irregularity of the triquetrum on the navicular view, but not persistent on the other views or hand study. Mild dorsal lunate tilting as described on hand radiograph. Osteopenia, chondrocalcinosis, advanced first CMC osteoarthritis. IMPRESSION: 1. Triquetrum irregularity on the single view that is favored projectional. If there is persistent wrist pain, recommend follow-up radiographs or CT. 2. Soft tissue swelling. 3. Osteopenia and chondrocalcinosis. Electronically Signed   By: Monte Fantasia M.D.   On: 05/23/2018 10:53   Ct Head Wo Contrast  Result Date: 05/23/2018 CLINICAL DATA:  Patient status post fall. No reported loss of consciousness. EXAM: CT HEAD WITHOUT CONTRAST TECHNIQUE: Contiguous axial images were obtained from the base of the skull through the vertex without intravenous contrast. COMPARISON:  Brain CT 09/11/2017 FINDINGS: Brain: Ventricles and sulci are prominent compatible with atrophy. Periventricular and subcortical white matter hypodensity compatible with chronic microvascular ischemic changes. Right occipital lobe encephalomalacia. Old basal ganglia lacunar infarcts bilaterally. No evidence for acute cortically based infarct, intracranial hemorrhage, mass lesion or mass-effect. Vascular: Internal carotid arterial vascular calcifications. Skull:  Intact. Sinuses/Orbits: Paranasal sinuses are well aerated. Mastoid air cells unremarkable. Orbits are unremarkable. Other: None. IMPRESSION: No acute intracranial process. Atrophy and chronic microvascular ischemic changes. Electronically Signed   By: Lovey Newcomer M.D.   On: 05/23/2018 12:33   Dg Hand Complete Left  Result Date: 05/23/2018 CLINICAL DATA:  Fall this morning with left wrist injury. Initial encounter. EXAM: LEFT HAND - COMPLETE 3+ VIEW COMPARISON:  None. FINDINGS: Osteopenia, chondrocalcinosis, and severe arthritis at the first [facility], fifth PIP, and 2/3 DIP joints. Erosive changes seen at the arthritic interphalangeal joints. Mild subluxation medially of the fifth PIP joint, likely degenerative. Soft tissue swelling about the wrist. Mild dorsal tilting of the lunate but no scapholunate interval widening. IMPRESSION: 1. Negative for fracture. 2. Chondrocalcinosis, osteopenia, and erosive osteoarthritis. 3. Dorsal tilting of the lunate without scapholunate interval widening, indeterminate for intercarpal instability. Electronically Signed   By: Monte Fantasia M.D.   On: 05/23/2018 10:50    Assessment and Plan  Generalized weakness- as demonstrated by multiple falls over the preceding months SNF -admitted for OT/PT  Left wrist laceration SNF -wound care with suture removal and 10 days  Parkinson's disease disease SNF -continue carbidopa-levodopa ER 36.25-145 mg 1 p.o. 3 times daily  Restless leg syndrome SNF -continue Mirapex 0.125 mg nightly  Hypothyroidism SNF-last TSH 10.8; at that time her dose was  increased to 125 mcg daily which will be continued  Osteoporosis SNF -continue calcium carbonate-vitamin D 1 p.o. daily     Time spent greater than 45 minutes;> 50% of time with patient was spent reviewing records, labs, tests and studies, counseling and developing plan of care  Webb Silversmith D. Sheppard Coil, MD

## 2018-05-28 ENCOUNTER — Ambulatory Visit: Payer: Self-pay | Admitting: *Deleted

## 2018-05-28 LAB — CBC AND DIFFERENTIAL
HEMATOCRIT: 33 — AB (ref 36–46)
HEMOGLOBIN: 11.3 — AB (ref 12.0–16.0)
Platelets: 252 (ref 150–399)
WBC: 4.4

## 2018-05-28 LAB — BASIC METABOLIC PANEL
BUN: 13 (ref 4–21)
Creatinine: 0.6 (ref 0.5–1.1)
Glucose: 93
Potassium: 4.2 (ref 3.4–5.3)
SODIUM: 139 (ref 137–147)

## 2018-05-28 NOTE — Telephone Encounter (Signed)
  Reason for Disposition . [1] Caller requests to speak ONLY to PCP AND [2] NON-URGENT question  Protocols used: PCP CALL - NO TRIAGE-A-AH

## 2018-05-28 NOTE — Telephone Encounter (Signed)
Pt states she did fall last Saturday early in the morning around 4:30am. Pt states she getting up to go to the bathroom and did no turn on any lights and fell using her walker. Pt states she did cut her hand during the fall and received 4 stitches in the ED.Pt stating she is not experiencing any pain or new symptoms at this time. Pt does not report any changes to her current mobilityPt currently at [facility]. Pt offered to make appt, but pt wants to know if Dr. Quay Burow thinks she needs to come in or if she needs to just wait until after she leaves rehab. Pt can be contacted at [phone]

## 2018-05-30 ENCOUNTER — Other Ambulatory Visit: Payer: Self-pay | Admitting: Neurology

## 2018-05-30 ENCOUNTER — Encounter: Payer: Self-pay | Admitting: Internal Medicine

## 2018-05-30 DIAGNOSIS — S61512A Laceration without foreign body of left wrist, initial encounter: Secondary | ICD-10-CM | POA: Insufficient documentation

## 2018-05-30 DIAGNOSIS — M810 Age-related osteoporosis without current pathological fracture: Secondary | ICD-10-CM | POA: Insufficient documentation

## 2018-05-31 NOTE — Telephone Encounter (Signed)
LVm informing pt.  

## 2018-05-31 NOTE — Telephone Encounter (Signed)
Call patient-let her know I just received this message today because of the holiday weekend.  She does not need to come see me if she does not feel as necessary as long as she can have the stitches removed as instructed by the emergency room.

## 2018-06-02 ENCOUNTER — Ambulatory Visit: Payer: Medicare Other | Admitting: Internal Medicine

## 2018-06-07 ENCOUNTER — Non-Acute Institutional Stay (SKILLED_NURSING_FACILITY): Payer: Medicare Other | Admitting: Internal Medicine

## 2018-06-07 ENCOUNTER — Encounter: Payer: Self-pay | Admitting: Internal Medicine

## 2018-06-07 DIAGNOSIS — G2581 Restless legs syndrome: Secondary | ICD-10-CM

## 2018-06-07 DIAGNOSIS — M810 Age-related osteoporosis without current pathological fracture: Secondary | ICD-10-CM

## 2018-06-07 DIAGNOSIS — G20 Parkinson's disease: Secondary | ICD-10-CM | POA: Diagnosis not present

## 2018-06-07 DIAGNOSIS — E034 Atrophy of thyroid (acquired): Secondary | ICD-10-CM | POA: Diagnosis not present

## 2018-06-07 DIAGNOSIS — G20A1 Parkinson's disease without dyskinesia, without mention of fluctuations: Secondary | ICD-10-CM

## 2018-06-07 DIAGNOSIS — S61512D Laceration without foreign body of left wrist, subsequent encounter: Secondary | ICD-10-CM | POA: Diagnosis not present

## 2018-06-07 DIAGNOSIS — N390 Urinary tract infection, site not specified: Secondary | ICD-10-CM | POA: Diagnosis not present

## 2018-06-07 DIAGNOSIS — R531 Weakness: Secondary | ICD-10-CM

## 2018-06-07 NOTE — Progress Notes (Signed)
Location:  [place] Room Number: 510C Place of Service:  SNF (31)  Noah Delaine. Sheppard Coil, MD  Patient Care Team: Binnie Rail, MD as PCP - General (Internal Medicine) Tat, Eustace Quail, DO as Consulting Physician (Neurology) Rutherford Guys, MD as Consulting Physician (Ophthalmology)  Extended Emergency Contact Information Primary Emergency Contact: Reha,Joey & Jesusita Oka [place] Phone: [phone] Mobile Phone: [phone] Relation: Nephew Secondary Emergency Contact: Grafton Folk [place] Phone: [phone] Relation: Friend  Allergies  Allergen Reactions  . Atorvastatin Other (See Comments)    REACTION: Felt funny (only way patient could describe)  . Crestor [Rosuvastatin Calcium] Other (See Comments)    Leg cramps, muscle aches  . Vesicare [Solifenacin]     constipation    Chief Complaint  Patient presents with  . Discharge Note    Discharge from [facility]    HPI:  84 y.o. female with Parkinson's disease, restless leg syndrome, hypothyroidism, hyperlipidemia, anxiety, who was admitted to skilled nursing facility from [facility] emergency department on 05/24/2018 for generalized weakness.  She was seen in the emergency department on 6/30 for fall in which she injured her wrist and which required sutures.  Patient was driven home and could not get out of her car due to weakness so was brought back to the emergency department.  Patient had had labs done on her initial visit which showed a potassium slightly low at 3.3, hemoglobin 10.6 urine with rare bacteria ECG with no acute findings.  Patient has had multiple falls over the preceding months.  PT OT eval in the emergency department and had recommended admission to skilled nursing facility but patient had declined.  However when patient could not get out of the car without help she agreed to be admitted to the skilled nursing facility patient was  admitted to skilled nursing facility for OT/PT and is now ready to be discharged home.    Past Medical History:  Diagnosis Date  . Abnormal Pap smear of vagina 09/2006   Dr Nori Riis  . Abnormality of gait 05/24/2015   PMH R occipital infarct B12 deficiency H/o polio affected left leg  In Physical Therapy to improve balance & strength through [facility]  . Arthritis 12/25/2015  . Cervicalgia 01/17/2009   MRI 03/27/16:  IMPRESSION: 1. At C3-4 there is a mild broad-based disc bulge. Moderate bilateral facet arthropathy and uncovertebral degenerative changes resulting in moderate bilateral foraminal stenosis. 2. At C5-6 there is a broad-based disc osteophyte complex. Bilateral uncovertebral degenerative changes and facet arthropathy resulting in moderate bilateral foraminal stenosis.  . Fall 09/11/2017  . History of CVA (cerebrovascular accident) 07/20/2012   Chronic R occipital infarct ; stable on CT 05/21/2012 .Dr Jannifer Franklin , Neurology   . History of TIA (transient ischemic attack) 01/26/2008  . HYPERLIPIDEMIA 01/26/2008   NMR Lipoprofile 2009: LDL 135 (1704/911), HDL 62, TG 131. LDL goal = < 100; ideally < 70. MGF MI in 62s No FH CVA  . Hypothyroidism   . Intention tremor 12/08/2013   Onset after fall 04/2012   . Irritable bowel syndrome (IBS) 01/17/2009   Dr. Unice Cobble  . Osteopenia    PMH fracture heel, wrist  . Parkinson disease ([place]) 06/25/2016   Dr Tat  . Pernicious anemia   . Post-polio syndrome    minor problems L leg  . Restless leg syndrome 12/25/2015   Dr Tat  . Subdural hygroma 09/11/2017  . Swallowing  disorder 03/04/2017  . Urinary incontinence 09/07/2008   Dr McDiarmid, Urology   . Vitamin B12 deficiency   . Weakness 06/24/2017    Past Surgical History:  Procedure Laterality Date  . BLADDER SUSPENSION  2000  . CATARACT EXTRACTION, BILATERAL     Dr. Gershon Crane  . no colonoscopy     "I never felt I needed one "  . YAG LASER APPLICATION Left    Dr. Gershon Crane     reports that she  has never smoked. She has never used smokeless tobacco. She reports that she drinks about 0.6 oz of alcohol per week. She reports that she does not use drugs. Social History   Socioeconomic History  . Marital status: Single    Spouse name: Not on file  . Number of children: Not on file  . Years of education: Not on file  . Highest education level: Not on file  Occupational History  . Not on file  Social Needs  . Financial resource strain: Not on file  . Food insecurity:    Worry: Not on file    Inability: Not on file  . Transportation needs:    Medical: Not on file    Non-medical: Not on file  Tobacco Use  . Smoking status: Never Smoker  . Smokeless tobacco: Never Used  Substance and Sexual Activity  . Alcohol use: Yes    Alcohol/week: 0.6 oz    Types: 1 Glasses of wine per week    Comment: 0.6 oz per week  . Drug use: No  . Sexual activity: Not on file  Lifestyle  . Physical activity:    Days per week: Not on file    Minutes per session: Not on file  . Stress: Not on file  Relationships  . Social connections:    Talks on phone: Not on file    Gets together: Not on file    Attends religious service: Not on file    Active member of club or organization: Not on file    Attends meetings of clubs or organizations: Not on file    Relationship status: Not on file  . Intimate partner violence:    Fear of current or ex partner: Not on file    Emotionally abused: Not on file    Physically abused: Not on file    Forced sexual activity: Not on file  Other Topics Concern  . Not on file  Social History Narrative  . Not on file    Pertinent  Health Maintenance Due  Topic Date Due  . PNA vac Low Risk Adult (1 of 2 - PCV13) 01/11/2019 (Originally 02/02/1998)  . DEXA SCAN  03/03/2019 (Originally 05/03/2016)  . INFLUENZA VACCINE  06/24/2018    Medications: Allergies as of 06/07/2018      Reactions   Atorvastatin Other (See Comments)   REACTION: Felt funny (only way patient  could describe)   Crestor [rosuvastatin Calcium] Other (See Comments)   Leg cramps, muscle aches   Vesicare [solifenacin]    constipation      Medication List        Accurate as of 06/07/18 11:59 PM. Always use your most recent med list.          aspirin 81 MG tablet Take 81 mg by mouth daily.   CALCIUM + D PO Take 1 tablet by mouth daily. 600-400   Carbidopa-Levodopa ER 36.25-145 MG Cpcr Commonly known as:  RYTARY Take 1 tablet by mouth 3 (three) times daily.  FLORASTOR 250 MG capsule Generic drug:  saccharomyces boulardii Take 250 mg by mouth daily.   furosemide 20 MG tablet Commonly known as:  LASIX Take 0.5 tablets (10 mg total) by mouth daily.   GAS-X EXTRA STRENGTH 125 MG Caps Generic drug:  Simethicone Take 125 mg by mouth daily as needed (gas).   levothyroxine 125 MCG tablet Commonly known as:  SYNTHROID, LEVOTHROID Take 1 tablet (125 mcg total) by mouth daily.   pramipexole 0.125 MG tablet Commonly known as:  MIRAPEX TAKE ONE TABLET AT BEDTIME.   vitamin B-12 1000 MCG tablet Commonly known as:  CYANOCOBALAMIN Take 1 tablet (1,000 mcg total) by mouth daily.        Vitals:   06/07/18 1131  BP: 108/64  Pulse: 69  Resp: 18  Temp: (!) 97 F (36.1 C)  Weight: 96 lb (43.5 kg)  Height: 5' (1.524 m)   Body mass index is 18.75 kg/m.  Physical Exam  GENERAL APPEARANCE: Alert, conversant. No acute distress.  HEENT: Unremarkable. RESPIRATORY: Breathing is even, unlabored. Lung sounds are clear   CARDIOVASCULAR: Heart RRR no murmurs, rubs or gallops. No peripheral edema.  GASTROINTESTINAL: Abdomen is soft, non-tender, not distended w/ normal bowel sounds.  NEUROLOGIC: Cranial nerves 2-12 grossly intact. Moves all extremities   Labs reviewed: Basic Metabolic Panel: Recent Labs    09/11/17 2244  09/13/17 0540  03/02/18 1430 04/28/18 1521 05/23/18 1238 05/28/18  NA  --    < > 136   < > 139 140 143 139  K  --    < > 3.3*   < > 3.4* 4.2 3.3*  4.2  CL  --    < > 103   < > 103 104 109  --   CO2  --    < > 26   < > 30 31 26   --   GLUCOSE  --    < > 104*   < > 93 95 99  --   BUN  --    < > 6   < > 11 21 25* 13  CREATININE  --    < > 0.65   < > 0.69 0.78 0.84 0.6  CALCIUM  --    < > 7.9*   < > 8.7 9.4 8.3*  --   MG 1.5*  --  1.7  --   --   --   --   --    < > = values in this interval not displayed.   No results found for: [facility] Liver Function Tests: Recent Labs    12/02/17 1536 03/02/18 1430 04/28/18 1521  AST 26 30 23   ALT 4 3 2   ALKPHOS 89 92 101  BILITOT 0.9 0.9 0.7  PROT 6.5 6.5 6.7  ALBUMIN 3.9 3.5 3.9   Recent Labs    11/27/17 1047  LIPASE 31   No results for input(s): AMMONIA in the last 8760 hours. CBC: Recent Labs    03/02/18 1430 04/28/18 1521 05/23/18 1238 05/28/18  WBC 4.7 4.7 5.0 4.4  NEUTROABS 2.7 2.2 3.3  --   HGB 10.8* 11.7* 10.6* 11.3*  HCT 31.9* 35.8* 31.8* 33*  MCV 83.0 85.3 85.3  --   PLT 246.0 228.0 206 252   Lipid No results for input(s): CHOL, HDL, LDLCALC, TRIG in the last 8760 hours. Cardiac Enzymes: Recent Labs    05/23/18 1238  TROPONINI <0.03   BNP: Recent Labs    11/27/17 1047  BNP 253.9*  CBG: No results for input(s): GLUCAP in the last 8760 hours.  Procedures and Imaging Studies During Stay: Dg Chest 1 View  Result Date: 05/23/2018 CLINICAL DATA:  Weakness.  Fall. EXAM: CHEST  1 VIEW COMPARISON:  Chest radiograph 11/27/2017 FINDINGS: Stable enlarged cardiac and mediastinal contours. No consolidative pulmonary opacities. No pleural effusion or pneumothorax. IMPRESSION: No acute cardiopulmonary process. Electronically Signed   By: Lovey Newcomer M.D.   On: 05/23/2018 12:46   Dg Wrist Complete Left  Result Date: 05/23/2018 CLINICAL DATA:  Fall this morning with right wrist pain. Initial encounter. EXAM: LEFT WRIST - COMPLETE 3+ VIEW COMPARISON:  None. FINDINGS: Irregularity of the triquetrum on the navicular view, but not persistent on the other views or hand  study. Mild dorsal lunate tilting as described on hand radiograph. Osteopenia, chondrocalcinosis, advanced first CMC osteoarthritis. IMPRESSION: 1. Triquetrum irregularity on the single view that is favored projectional. If there is persistent wrist pain, recommend follow-up radiographs or CT. 2. Soft tissue swelling. 3. Osteopenia and chondrocalcinosis. Electronically Signed   By: Monte Fantasia M.D.   On: 05/23/2018 10:53   Ct Head Wo Contrast  Result Date: 05/23/2018 CLINICAL DATA:  Patient status post fall. No reported loss of consciousness. EXAM: CT HEAD WITHOUT CONTRAST TECHNIQUE: Contiguous axial images were obtained from the base of the skull through the vertex without intravenous contrast. COMPARISON:  Brain CT 09/11/2017 FINDINGS: Brain: Ventricles and sulci are prominent compatible with atrophy. Periventricular and subcortical white matter hypodensity compatible with chronic microvascular ischemic changes. Right occipital lobe encephalomalacia. Old basal ganglia lacunar infarcts bilaterally. No evidence for acute cortically based infarct, intracranial hemorrhage, mass lesion or mass-effect. Vascular: Internal carotid arterial vascular calcifications. Skull: Intact. Sinuses/Orbits: Paranasal sinuses are well aerated. Mastoid air cells unremarkable. Orbits are unremarkable. Other: None. IMPRESSION: No acute intracranial process. Atrophy and chronic microvascular ischemic changes. Electronically Signed   By: Lovey Newcomer M.D.   On: 05/23/2018 12:33   Dg Hand Complete Left  Result Date: 05/23/2018 CLINICAL DATA:  Fall this morning with left wrist injury. Initial encounter. EXAM: LEFT HAND - COMPLETE 3+ VIEW COMPARISON:  None. FINDINGS: Osteopenia, chondrocalcinosis, and severe arthritis at the first [facility], fifth PIP, and 2/3 DIP joints. Erosive changes seen at the arthritic interphalangeal joints. Mild subluxation medially of the fifth PIP joint, likely degenerative. Soft tissue swelling about the  wrist. Mild dorsal tilting of the lunate but no scapholunate interval widening. IMPRESSION: 1. Negative for fracture. 2. Chondrocalcinosis, osteopenia, and erosive osteoarthritis. 3. Dorsal tilting of the lunate without scapholunate interval widening, indeterminate for intercarpal instability. Electronically Signed   By: Monte Fantasia M.D.   On: 05/23/2018 10:50    Assessment/Plan:   Urinary tract infection without hematuria, site unspecified  Generalized weakness  Laceration of left wrist, subsequent encounter  Age-related osteoporosis without current pathological fracture  Parkinson disease ([place])  Restless leg syndrome  Hypothyroidism due to acquired atrophy of thyroid   Patient is being discharged with the following home health services: PT/PT/nursing  Patient is being discharged with the following durable medical equipment: None  Patient has been advised to f/u with their PCP in 1-2 weeks to bring them up to date on their rehab stay.  Social services at facility was responsible for arranging this appointment.  Pt was provided with a 30 day supply of prescriptions for medications and refills must be obtained from their PCP.  For controlled substances, a more limited supply may be provided adequate until PCP appointment only.  Medications have  been reconciled.  Time spent than 30 minutes;> 50% of time with patient was spent reviewing records, labs, tests and studies, counseling and developing plan of care   Noah Delaine. Sheppard Coil, MD

## 2018-06-08 DIAGNOSIS — M542 Cervicalgia: Secondary | ICD-10-CM

## 2018-06-08 DIAGNOSIS — M1991 Primary osteoarthritis, unspecified site: Secondary | ICD-10-CM

## 2018-06-08 DIAGNOSIS — N3281 Overactive bladder: Secondary | ICD-10-CM

## 2018-06-08 DIAGNOSIS — G2581 Restless legs syndrome: Secondary | ICD-10-CM

## 2018-06-08 DIAGNOSIS — E538 Deficiency of other specified B group vitamins: Secondary | ICD-10-CM

## 2018-06-08 DIAGNOSIS — Z8673 Personal history of transient ischemic attack (TIA), and cerebral infarction without residual deficits: Secondary | ICD-10-CM

## 2018-06-08 DIAGNOSIS — E785 Hyperlipidemia, unspecified: Secondary | ICD-10-CM

## 2018-06-08 DIAGNOSIS — K589 Irritable bowel syndrome without diarrhea: Secondary | ICD-10-CM

## 2018-06-08 DIAGNOSIS — Z7982 Long term (current) use of aspirin: Secondary | ICD-10-CM

## 2018-06-08 DIAGNOSIS — M8580 Other specified disorders of bone density and structure, unspecified site: Secondary | ICD-10-CM

## 2018-06-08 DIAGNOSIS — E039 Hypothyroidism, unspecified: Secondary | ICD-10-CM

## 2018-06-08 DIAGNOSIS — R296 Repeated falls: Secondary | ICD-10-CM

## 2018-06-08 DIAGNOSIS — G14 Postpolio syndrome: Secondary | ICD-10-CM

## 2018-06-08 DIAGNOSIS — G20 Parkinson's disease: Secondary | ICD-10-CM

## 2018-06-08 DIAGNOSIS — Z9181 History of falling: Secondary | ICD-10-CM

## 2018-06-08 DIAGNOSIS — F419 Anxiety disorder, unspecified: Secondary | ICD-10-CM

## 2018-06-08 DIAGNOSIS — D649 Anemia, unspecified: Secondary | ICD-10-CM

## 2018-06-13 ENCOUNTER — Encounter: Payer: Self-pay | Admitting: Internal Medicine

## 2018-06-18 ENCOUNTER — Ambulatory Visit: Payer: Medicare Other | Admitting: Podiatry

## 2018-06-21 DIAGNOSIS — G20 Parkinson's disease: Secondary | ICD-10-CM | POA: Diagnosis not present

## 2018-06-21 DIAGNOSIS — E538 Deficiency of other specified B group vitamins: Secondary | ICD-10-CM | POA: Diagnosis not present

## 2018-06-21 DIAGNOSIS — M1991 Primary osteoarthritis, unspecified site: Secondary | ICD-10-CM | POA: Diagnosis not present

## 2018-06-21 DIAGNOSIS — D649 Anemia, unspecified: Secondary | ICD-10-CM | POA: Diagnosis not present

## 2018-06-21 DIAGNOSIS — E785 Hyperlipidemia, unspecified: Secondary | ICD-10-CM | POA: Diagnosis not present

## 2018-06-21 DIAGNOSIS — M8580 Other specified disorders of bone density and structure, unspecified site: Secondary | ICD-10-CM | POA: Diagnosis not present

## 2018-06-21 DIAGNOSIS — N3281 Overactive bladder: Secondary | ICD-10-CM | POA: Diagnosis not present

## 2018-06-21 DIAGNOSIS — E039 Hypothyroidism, unspecified: Secondary | ICD-10-CM | POA: Diagnosis not present

## 2018-06-21 DIAGNOSIS — M542 Cervicalgia: Secondary | ICD-10-CM | POA: Diagnosis not present

## 2018-06-21 DIAGNOSIS — G2581 Restless legs syndrome: Secondary | ICD-10-CM | POA: Diagnosis not present

## 2018-06-21 DIAGNOSIS — K589 Irritable bowel syndrome without diarrhea: Secondary | ICD-10-CM | POA: Diagnosis not present

## 2018-06-22 ENCOUNTER — Telehealth: Payer: Self-pay | Admitting: Internal Medicine

## 2018-06-22 DIAGNOSIS — E785 Hyperlipidemia, unspecified: Secondary | ICD-10-CM | POA: Diagnosis not present

## 2018-06-22 DIAGNOSIS — E039 Hypothyroidism, unspecified: Secondary | ICD-10-CM | POA: Diagnosis not present

## 2018-06-22 DIAGNOSIS — M8580 Other specified disorders of bone density and structure, unspecified site: Secondary | ICD-10-CM | POA: Diagnosis not present

## 2018-06-22 DIAGNOSIS — D649 Anemia, unspecified: Secondary | ICD-10-CM | POA: Diagnosis not present

## 2018-06-22 DIAGNOSIS — K589 Irritable bowel syndrome without diarrhea: Secondary | ICD-10-CM | POA: Diagnosis not present

## 2018-06-22 DIAGNOSIS — G20 Parkinson's disease: Secondary | ICD-10-CM | POA: Diagnosis not present

## 2018-06-22 DIAGNOSIS — M542 Cervicalgia: Secondary | ICD-10-CM | POA: Diagnosis not present

## 2018-06-22 DIAGNOSIS — N3281 Overactive bladder: Secondary | ICD-10-CM | POA: Diagnosis not present

## 2018-06-22 DIAGNOSIS — M1991 Primary osteoarthritis, unspecified site: Secondary | ICD-10-CM | POA: Diagnosis not present

## 2018-06-22 DIAGNOSIS — G2581 Restless legs syndrome: Secondary | ICD-10-CM | POA: Diagnosis not present

## 2018-06-22 DIAGNOSIS — E538 Deficiency of other specified B group vitamins: Secondary | ICD-10-CM | POA: Diagnosis not present

## 2018-06-22 NOTE — Telephone Encounter (Signed)
Spoke with Olivia Mackie to give verbal orders for nursing per MD

## 2018-06-22 NOTE — Telephone Encounter (Signed)
Copied from [place] [phone]. Topic: Quick Communication - See Telephone Encounter >> Jun 22, 2018  4:03 PM Percell Belt A wrote: CRM for notification. See Telephone encounter for: 06/22/18. Olivia Mackie from [place] at home [phone] Need verbals for Nursing   1 week 2

## 2018-06-23 ENCOUNTER — Ambulatory Visit: Payer: Medicare Other | Admitting: Internal Medicine

## 2018-06-23 ENCOUNTER — Inpatient Hospital Stay: Payer: Medicare Other | Admitting: Internal Medicine

## 2018-06-23 ENCOUNTER — Ambulatory Visit: Payer: Medicare Other | Admitting: Podiatry

## 2018-06-23 ENCOUNTER — Encounter: Payer: Self-pay | Admitting: Podiatry

## 2018-06-23 DIAGNOSIS — L84 Corns and callosities: Secondary | ICD-10-CM | POA: Diagnosis not present

## 2018-06-23 DIAGNOSIS — M2010 Hallux valgus (acquired), unspecified foot: Secondary | ICD-10-CM

## 2018-06-23 DIAGNOSIS — M2580 Other specified joint disorders, unspecified joint: Secondary | ICD-10-CM | POA: Diagnosis not present

## 2018-06-23 NOTE — Progress Notes (Signed)
This patient returns to the offifor continued evaluation and treatment of her painful calluses, left forefoot.   She has previously been treated by Dr. Paulla Dolly who diagnosed her as having a structural bunion deformity with chronic skin lesion under the ball of her left foot. Marland Kitchen She was seen 3 months ago and was treated with debridement and padding for the painful skin lesion, left foot.  She presents the office today for continued preventative foot care services.  General Appearance  Alert, conversant and in no acute stress.  Vascular  Dorsalis pedis and posterior tibial  pulses are palpable  bilaterally.  Capillary return is within normal limits  bilaterally. Temperature is within normal limits  bilaterally.  Neurologic  Senn-Weinstein monofilament wire test within normal limits  bilaterally. Muscle power within normal limits bilaterally.  Nails Thick disfigured discolored nails with subungual debris  from hallux to fifth toes bilaterally. No evidence of bacterial infection or drainage bilaterally.  Orthopedic  No limitations of motion of motion feet .  No crepitus or effusions noted.  HAV with hammer toes  B/L.  Skin  normotropic skin  noted bilaterally.  No signs of infections or ulcers noted.  Porkeratosis sub fibular  sesamoid left foot with callus sub 5th left foot  Porokeratosis left foot.  Sesamoiditis.  ROV  Debride callus.  Padding applied to shoe and padding was dispensed.   Gardiner Barefoot DPM

## 2018-06-25 DIAGNOSIS — D649 Anemia, unspecified: Secondary | ICD-10-CM | POA: Diagnosis not present

## 2018-06-25 DIAGNOSIS — G20 Parkinson's disease: Secondary | ICD-10-CM | POA: Diagnosis not present

## 2018-06-25 DIAGNOSIS — E039 Hypothyroidism, unspecified: Secondary | ICD-10-CM | POA: Diagnosis not present

## 2018-06-25 DIAGNOSIS — N3281 Overactive bladder: Secondary | ICD-10-CM | POA: Diagnosis not present

## 2018-06-25 DIAGNOSIS — M1991 Primary osteoarthritis, unspecified site: Secondary | ICD-10-CM | POA: Diagnosis not present

## 2018-06-25 DIAGNOSIS — M542 Cervicalgia: Secondary | ICD-10-CM | POA: Diagnosis not present

## 2018-06-25 DIAGNOSIS — K589 Irritable bowel syndrome without diarrhea: Secondary | ICD-10-CM | POA: Diagnosis not present

## 2018-06-25 DIAGNOSIS — G2581 Restless legs syndrome: Secondary | ICD-10-CM | POA: Diagnosis not present

## 2018-06-25 DIAGNOSIS — M8580 Other specified disorders of bone density and structure, unspecified site: Secondary | ICD-10-CM | POA: Diagnosis not present

## 2018-06-25 DIAGNOSIS — E785 Hyperlipidemia, unspecified: Secondary | ICD-10-CM | POA: Diagnosis not present

## 2018-06-25 DIAGNOSIS — E538 Deficiency of other specified B group vitamins: Secondary | ICD-10-CM | POA: Diagnosis not present

## 2018-06-28 DIAGNOSIS — D649 Anemia, unspecified: Secondary | ICD-10-CM | POA: Diagnosis not present

## 2018-06-28 DIAGNOSIS — G20 Parkinson's disease: Secondary | ICD-10-CM | POA: Diagnosis not present

## 2018-06-28 DIAGNOSIS — G2581 Restless legs syndrome: Secondary | ICD-10-CM | POA: Diagnosis not present

## 2018-06-28 DIAGNOSIS — N3281 Overactive bladder: Secondary | ICD-10-CM | POA: Diagnosis not present

## 2018-06-28 DIAGNOSIS — E538 Deficiency of other specified B group vitamins: Secondary | ICD-10-CM | POA: Diagnosis not present

## 2018-06-28 DIAGNOSIS — M1991 Primary osteoarthritis, unspecified site: Secondary | ICD-10-CM | POA: Diagnosis not present

## 2018-06-28 DIAGNOSIS — E039 Hypothyroidism, unspecified: Secondary | ICD-10-CM | POA: Diagnosis not present

## 2018-06-28 DIAGNOSIS — E785 Hyperlipidemia, unspecified: Secondary | ICD-10-CM | POA: Diagnosis not present

## 2018-06-28 DIAGNOSIS — M542 Cervicalgia: Secondary | ICD-10-CM | POA: Diagnosis not present

## 2018-06-28 DIAGNOSIS — K589 Irritable bowel syndrome without diarrhea: Secondary | ICD-10-CM | POA: Diagnosis not present

## 2018-06-28 DIAGNOSIS — M8580 Other specified disorders of bone density and structure, unspecified site: Secondary | ICD-10-CM | POA: Diagnosis not present

## 2018-07-02 DIAGNOSIS — D649 Anemia, unspecified: Secondary | ICD-10-CM | POA: Diagnosis not present

## 2018-07-02 DIAGNOSIS — E785 Hyperlipidemia, unspecified: Secondary | ICD-10-CM | POA: Diagnosis not present

## 2018-07-02 DIAGNOSIS — N3281 Overactive bladder: Secondary | ICD-10-CM | POA: Diagnosis not present

## 2018-07-02 DIAGNOSIS — G2581 Restless legs syndrome: Secondary | ICD-10-CM | POA: Diagnosis not present

## 2018-07-02 DIAGNOSIS — E538 Deficiency of other specified B group vitamins: Secondary | ICD-10-CM | POA: Diagnosis not present

## 2018-07-02 DIAGNOSIS — G20 Parkinson's disease: Secondary | ICD-10-CM | POA: Diagnosis not present

## 2018-07-02 DIAGNOSIS — M1991 Primary osteoarthritis, unspecified site: Secondary | ICD-10-CM | POA: Diagnosis not present

## 2018-07-02 DIAGNOSIS — M8580 Other specified disorders of bone density and structure, unspecified site: Secondary | ICD-10-CM | POA: Diagnosis not present

## 2018-07-02 DIAGNOSIS — M542 Cervicalgia: Secondary | ICD-10-CM | POA: Diagnosis not present

## 2018-07-02 DIAGNOSIS — K589 Irritable bowel syndrome without diarrhea: Secondary | ICD-10-CM | POA: Diagnosis not present

## 2018-07-02 DIAGNOSIS — E039 Hypothyroidism, unspecified: Secondary | ICD-10-CM | POA: Diagnosis not present

## 2018-07-05 DIAGNOSIS — M8580 Other specified disorders of bone density and structure, unspecified site: Secondary | ICD-10-CM | POA: Diagnosis not present

## 2018-07-05 DIAGNOSIS — G20 Parkinson's disease: Secondary | ICD-10-CM | POA: Diagnosis not present

## 2018-07-05 DIAGNOSIS — M1991 Primary osteoarthritis, unspecified site: Secondary | ICD-10-CM | POA: Diagnosis not present

## 2018-07-05 DIAGNOSIS — K589 Irritable bowel syndrome without diarrhea: Secondary | ICD-10-CM | POA: Diagnosis not present

## 2018-07-05 DIAGNOSIS — D649 Anemia, unspecified: Secondary | ICD-10-CM | POA: Diagnosis not present

## 2018-07-05 DIAGNOSIS — G2581 Restless legs syndrome: Secondary | ICD-10-CM | POA: Diagnosis not present

## 2018-07-05 DIAGNOSIS — E039 Hypothyroidism, unspecified: Secondary | ICD-10-CM | POA: Diagnosis not present

## 2018-07-05 DIAGNOSIS — N3281 Overactive bladder: Secondary | ICD-10-CM | POA: Diagnosis not present

## 2018-07-05 DIAGNOSIS — E538 Deficiency of other specified B group vitamins: Secondary | ICD-10-CM | POA: Diagnosis not present

## 2018-07-05 DIAGNOSIS — E785 Hyperlipidemia, unspecified: Secondary | ICD-10-CM | POA: Diagnosis not present

## 2018-07-05 DIAGNOSIS — M542 Cervicalgia: Secondary | ICD-10-CM | POA: Diagnosis not present

## 2018-07-06 ENCOUNTER — Telehealth: Payer: Self-pay | Admitting: Internal Medicine

## 2018-07-06 NOTE — Telephone Encounter (Signed)
Not a patient at this office

## 2018-07-06 NOTE — Telephone Encounter (Signed)
Copied from [place] [id number]. Topic: Quick Communication - See Telephone Encounter >> Jul 06, 2018 11:30 AM Percell Belt A wrote: CRM for notification. See Telephone encounter for: 07/06/18. Jim with [place] at home [phone] Need verbals for OT  2 week 1

## 2018-07-07 ENCOUNTER — Encounter: Payer: Self-pay | Admitting: Podiatry

## 2018-07-07 ENCOUNTER — Ambulatory Visit: Payer: Medicare Other | Admitting: Podiatry

## 2018-07-07 DIAGNOSIS — M2040 Other hammer toe(s) (acquired), unspecified foot: Secondary | ICD-10-CM | POA: Diagnosis not present

## 2018-07-07 DIAGNOSIS — L84 Corns and callosities: Secondary | ICD-10-CM | POA: Diagnosis not present

## 2018-07-08 ENCOUNTER — Telehealth: Payer: Self-pay | Admitting: Internal Medicine

## 2018-07-08 DIAGNOSIS — E785 Hyperlipidemia, unspecified: Secondary | ICD-10-CM | POA: Diagnosis not present

## 2018-07-08 DIAGNOSIS — E039 Hypothyroidism, unspecified: Secondary | ICD-10-CM | POA: Diagnosis not present

## 2018-07-08 DIAGNOSIS — E538 Deficiency of other specified B group vitamins: Secondary | ICD-10-CM | POA: Diagnosis not present

## 2018-07-08 DIAGNOSIS — G20 Parkinson's disease: Secondary | ICD-10-CM | POA: Diagnosis not present

## 2018-07-08 DIAGNOSIS — M1991 Primary osteoarthritis, unspecified site: Secondary | ICD-10-CM | POA: Diagnosis not present

## 2018-07-08 DIAGNOSIS — K589 Irritable bowel syndrome without diarrhea: Secondary | ICD-10-CM | POA: Diagnosis not present

## 2018-07-08 DIAGNOSIS — G2581 Restless legs syndrome: Secondary | ICD-10-CM | POA: Diagnosis not present

## 2018-07-08 DIAGNOSIS — M542 Cervicalgia: Secondary | ICD-10-CM | POA: Diagnosis not present

## 2018-07-08 DIAGNOSIS — N3281 Overactive bladder: Secondary | ICD-10-CM | POA: Diagnosis not present

## 2018-07-08 DIAGNOSIS — M8580 Other specified disorders of bone density and structure, unspecified site: Secondary | ICD-10-CM | POA: Diagnosis not present

## 2018-07-08 DIAGNOSIS — D649 Anemia, unspecified: Secondary | ICD-10-CM | POA: Diagnosis not present

## 2018-07-08 NOTE — Progress Notes (Signed)
Subjective:   Patient ID: Elizabeth [name], female   DOB: 84 y.o.   MRN: [id number]   HPI Patient presents with severe lesion subsecond metatarsal left and significant digital deformity of the right big toe which becomes irritated and makes it hard to wear shoe gear comfortably   ROS      Objective:  Physical Exam  Neurovascular status unchanged with patient having horrible back problems and completely bent over with elevation of the right big toe with slight irritation of the dorsal surface and severe keratotic lesion subsecond metatarsal left     Assessment:  Structural changes with elevation of the hallux right and keratotic lesion plantar left     Plan:  Reviewed surgical intervention in this case and I strongly dissuaded her from going this route as I do not think it will provide great benefit for her and I recommended padding and using sterile sharp instrumentation I debrided the lesion subsecond metatarsal left which was tolerated well and she will be seen back for routine care

## 2018-07-08 NOTE — Telephone Encounter (Signed)
Copied from [place] [id number]. Topic: General - Other >> Jul 08, 2018  9:26 AM Judyann Munson wrote: Reason for CRM:  Flo with [place] at home [phone] Need verbals for Physical therapy  2 week 3

## 2018-07-08 NOTE — Telephone Encounter (Signed)
LVM with Flo giving verbal orders per MD for PT.

## 2018-07-12 DIAGNOSIS — S61512D Laceration without foreign body of left wrist, subsequent encounter: Secondary | ICD-10-CM | POA: Diagnosis not present

## 2018-07-12 DIAGNOSIS — K589 Irritable bowel syndrome without diarrhea: Secondary | ICD-10-CM | POA: Diagnosis not present

## 2018-07-12 DIAGNOSIS — D649 Anemia, unspecified: Secondary | ICD-10-CM | POA: Diagnosis not present

## 2018-07-12 DIAGNOSIS — M1991 Primary osteoarthritis, unspecified site: Secondary | ICD-10-CM | POA: Diagnosis not present

## 2018-07-12 DIAGNOSIS — N3281 Overactive bladder: Secondary | ICD-10-CM | POA: Diagnosis not present

## 2018-07-12 DIAGNOSIS — M8580 Other specified disorders of bone density and structure, unspecified site: Secondary | ICD-10-CM | POA: Diagnosis not present

## 2018-07-12 DIAGNOSIS — M542 Cervicalgia: Secondary | ICD-10-CM | POA: Diagnosis not present

## 2018-07-12 DIAGNOSIS — G20 Parkinson's disease: Secondary | ICD-10-CM | POA: Diagnosis not present

## 2018-07-12 DIAGNOSIS — E538 Deficiency of other specified B group vitamins: Secondary | ICD-10-CM | POA: Diagnosis not present

## 2018-07-12 DIAGNOSIS — G2581 Restless legs syndrome: Secondary | ICD-10-CM | POA: Diagnosis not present

## 2018-07-12 DIAGNOSIS — E039 Hypothyroidism, unspecified: Secondary | ICD-10-CM | POA: Diagnosis not present

## 2018-07-13 DIAGNOSIS — N3281 Overactive bladder: Secondary | ICD-10-CM | POA: Diagnosis not present

## 2018-07-13 DIAGNOSIS — D649 Anemia, unspecified: Secondary | ICD-10-CM | POA: Diagnosis not present

## 2018-07-13 DIAGNOSIS — G2581 Restless legs syndrome: Secondary | ICD-10-CM | POA: Diagnosis not present

## 2018-07-13 DIAGNOSIS — E538 Deficiency of other specified B group vitamins: Secondary | ICD-10-CM | POA: Diagnosis not present

## 2018-07-13 DIAGNOSIS — S61512D Laceration without foreign body of left wrist, subsequent encounter: Secondary | ICD-10-CM | POA: Diagnosis not present

## 2018-07-13 DIAGNOSIS — M1991 Primary osteoarthritis, unspecified site: Secondary | ICD-10-CM | POA: Diagnosis not present

## 2018-07-13 DIAGNOSIS — E039 Hypothyroidism, unspecified: Secondary | ICD-10-CM | POA: Diagnosis not present

## 2018-07-13 DIAGNOSIS — K589 Irritable bowel syndrome without diarrhea: Secondary | ICD-10-CM | POA: Diagnosis not present

## 2018-07-13 DIAGNOSIS — G20 Parkinson's disease: Secondary | ICD-10-CM | POA: Diagnosis not present

## 2018-07-13 DIAGNOSIS — M542 Cervicalgia: Secondary | ICD-10-CM | POA: Diagnosis not present

## 2018-07-13 DIAGNOSIS — M8580 Other specified disorders of bone density and structure, unspecified site: Secondary | ICD-10-CM | POA: Diagnosis not present

## 2018-07-14 DIAGNOSIS — D649 Anemia, unspecified: Secondary | ICD-10-CM | POA: Diagnosis not present

## 2018-07-14 DIAGNOSIS — E538 Deficiency of other specified B group vitamins: Secondary | ICD-10-CM | POA: Diagnosis not present

## 2018-07-14 DIAGNOSIS — G2581 Restless legs syndrome: Secondary | ICD-10-CM | POA: Diagnosis not present

## 2018-07-14 DIAGNOSIS — M8580 Other specified disorders of bone density and structure, unspecified site: Secondary | ICD-10-CM | POA: Diagnosis not present

## 2018-07-14 DIAGNOSIS — K589 Irritable bowel syndrome without diarrhea: Secondary | ICD-10-CM | POA: Diagnosis not present

## 2018-07-14 DIAGNOSIS — M542 Cervicalgia: Secondary | ICD-10-CM | POA: Diagnosis not present

## 2018-07-14 DIAGNOSIS — G20 Parkinson's disease: Secondary | ICD-10-CM | POA: Diagnosis not present

## 2018-07-14 DIAGNOSIS — M1991 Primary osteoarthritis, unspecified site: Secondary | ICD-10-CM | POA: Diagnosis not present

## 2018-07-14 DIAGNOSIS — S61512D Laceration without foreign body of left wrist, subsequent encounter: Secondary | ICD-10-CM | POA: Diagnosis not present

## 2018-07-14 DIAGNOSIS — E039 Hypothyroidism, unspecified: Secondary | ICD-10-CM | POA: Diagnosis not present

## 2018-07-14 DIAGNOSIS — N3281 Overactive bladder: Secondary | ICD-10-CM | POA: Diagnosis not present

## 2018-07-19 DIAGNOSIS — K589 Irritable bowel syndrome without diarrhea: Secondary | ICD-10-CM | POA: Diagnosis not present

## 2018-07-19 DIAGNOSIS — E538 Deficiency of other specified B group vitamins: Secondary | ICD-10-CM | POA: Diagnosis not present

## 2018-07-19 DIAGNOSIS — G20 Parkinson's disease: Secondary | ICD-10-CM | POA: Diagnosis not present

## 2018-07-19 DIAGNOSIS — M542 Cervicalgia: Secondary | ICD-10-CM | POA: Diagnosis not present

## 2018-07-19 DIAGNOSIS — S61512D Laceration without foreign body of left wrist, subsequent encounter: Secondary | ICD-10-CM | POA: Diagnosis not present

## 2018-07-19 DIAGNOSIS — D649 Anemia, unspecified: Secondary | ICD-10-CM | POA: Diagnosis not present

## 2018-07-19 DIAGNOSIS — M8580 Other specified disorders of bone density and structure, unspecified site: Secondary | ICD-10-CM | POA: Diagnosis not present

## 2018-07-19 DIAGNOSIS — M1991 Primary osteoarthritis, unspecified site: Secondary | ICD-10-CM | POA: Diagnosis not present

## 2018-07-19 DIAGNOSIS — N3281 Overactive bladder: Secondary | ICD-10-CM | POA: Diagnosis not present

## 2018-07-19 DIAGNOSIS — G2581 Restless legs syndrome: Secondary | ICD-10-CM | POA: Diagnosis not present

## 2018-07-19 DIAGNOSIS — E039 Hypothyroidism, unspecified: Secondary | ICD-10-CM | POA: Diagnosis not present

## 2018-07-21 DIAGNOSIS — S61512D Laceration without foreign body of left wrist, subsequent encounter: Secondary | ICD-10-CM | POA: Diagnosis not present

## 2018-07-21 DIAGNOSIS — M542 Cervicalgia: Secondary | ICD-10-CM | POA: Diagnosis not present

## 2018-07-21 DIAGNOSIS — D649 Anemia, unspecified: Secondary | ICD-10-CM | POA: Diagnosis not present

## 2018-07-21 DIAGNOSIS — G2581 Restless legs syndrome: Secondary | ICD-10-CM | POA: Diagnosis not present

## 2018-07-21 DIAGNOSIS — K589 Irritable bowel syndrome without diarrhea: Secondary | ICD-10-CM | POA: Diagnosis not present

## 2018-07-21 DIAGNOSIS — E039 Hypothyroidism, unspecified: Secondary | ICD-10-CM | POA: Diagnosis not present

## 2018-07-21 DIAGNOSIS — N3281 Overactive bladder: Secondary | ICD-10-CM | POA: Diagnosis not present

## 2018-07-21 DIAGNOSIS — G20 Parkinson's disease: Secondary | ICD-10-CM | POA: Diagnosis not present

## 2018-07-21 DIAGNOSIS — E538 Deficiency of other specified B group vitamins: Secondary | ICD-10-CM | POA: Diagnosis not present

## 2018-07-21 DIAGNOSIS — M8580 Other specified disorders of bone density and structure, unspecified site: Secondary | ICD-10-CM | POA: Diagnosis not present

## 2018-07-21 DIAGNOSIS — M1991 Primary osteoarthritis, unspecified site: Secondary | ICD-10-CM | POA: Diagnosis not present

## 2018-07-22 DIAGNOSIS — Z6820 Body mass index (BMI) 20.0-20.9, adult: Secondary | ICD-10-CM | POA: Diagnosis not present

## 2018-07-27 DIAGNOSIS — G2581 Restless legs syndrome: Secondary | ICD-10-CM | POA: Diagnosis not present

## 2018-07-27 DIAGNOSIS — D649 Anemia, unspecified: Secondary | ICD-10-CM | POA: Diagnosis not present

## 2018-07-27 DIAGNOSIS — M1991 Primary osteoarthritis, unspecified site: Secondary | ICD-10-CM | POA: Diagnosis not present

## 2018-07-27 DIAGNOSIS — S61512D Laceration without foreign body of left wrist, subsequent encounter: Secondary | ICD-10-CM | POA: Diagnosis not present

## 2018-07-27 DIAGNOSIS — E538 Deficiency of other specified B group vitamins: Secondary | ICD-10-CM | POA: Diagnosis not present

## 2018-07-27 DIAGNOSIS — G20 Parkinson's disease: Secondary | ICD-10-CM | POA: Diagnosis not present

## 2018-07-27 DIAGNOSIS — N3281 Overactive bladder: Secondary | ICD-10-CM | POA: Diagnosis not present

## 2018-07-27 DIAGNOSIS — K589 Irritable bowel syndrome without diarrhea: Secondary | ICD-10-CM | POA: Diagnosis not present

## 2018-07-27 DIAGNOSIS — M542 Cervicalgia: Secondary | ICD-10-CM | POA: Diagnosis not present

## 2018-07-27 DIAGNOSIS — E039 Hypothyroidism, unspecified: Secondary | ICD-10-CM | POA: Diagnosis not present

## 2018-07-27 DIAGNOSIS — M8580 Other specified disorders of bone density and structure, unspecified site: Secondary | ICD-10-CM | POA: Diagnosis not present

## 2018-07-29 DIAGNOSIS — M8580 Other specified disorders of bone density and structure, unspecified site: Secondary | ICD-10-CM | POA: Diagnosis not present

## 2018-07-29 DIAGNOSIS — E039 Hypothyroidism, unspecified: Secondary | ICD-10-CM | POA: Diagnosis not present

## 2018-07-29 DIAGNOSIS — K589 Irritable bowel syndrome without diarrhea: Secondary | ICD-10-CM | POA: Diagnosis not present

## 2018-07-29 DIAGNOSIS — N3281 Overactive bladder: Secondary | ICD-10-CM | POA: Diagnosis not present

## 2018-07-29 DIAGNOSIS — G20 Parkinson's disease: Secondary | ICD-10-CM | POA: Diagnosis not present

## 2018-07-29 DIAGNOSIS — D649 Anemia, unspecified: Secondary | ICD-10-CM | POA: Diagnosis not present

## 2018-07-29 DIAGNOSIS — G2581 Restless legs syndrome: Secondary | ICD-10-CM | POA: Diagnosis not present

## 2018-07-29 DIAGNOSIS — M1991 Primary osteoarthritis, unspecified site: Secondary | ICD-10-CM | POA: Diagnosis not present

## 2018-07-29 DIAGNOSIS — M542 Cervicalgia: Secondary | ICD-10-CM | POA: Diagnosis not present

## 2018-07-29 DIAGNOSIS — S61512D Laceration without foreign body of left wrist, subsequent encounter: Secondary | ICD-10-CM | POA: Diagnosis not present

## 2018-07-29 DIAGNOSIS — E538 Deficiency of other specified B group vitamins: Secondary | ICD-10-CM | POA: Diagnosis not present

## 2018-07-30 DIAGNOSIS — S61512D Laceration without foreign body of left wrist, subsequent encounter: Secondary | ICD-10-CM | POA: Diagnosis not present

## 2018-07-30 DIAGNOSIS — M542 Cervicalgia: Secondary | ICD-10-CM | POA: Diagnosis not present

## 2018-07-30 DIAGNOSIS — M1991 Primary osteoarthritis, unspecified site: Secondary | ICD-10-CM | POA: Diagnosis not present

## 2018-07-30 DIAGNOSIS — E538 Deficiency of other specified B group vitamins: Secondary | ICD-10-CM | POA: Diagnosis not present

## 2018-07-30 DIAGNOSIS — D649 Anemia, unspecified: Secondary | ICD-10-CM | POA: Diagnosis not present

## 2018-07-30 DIAGNOSIS — K589 Irritable bowel syndrome without diarrhea: Secondary | ICD-10-CM | POA: Diagnosis not present

## 2018-07-30 DIAGNOSIS — G20 Parkinson's disease: Secondary | ICD-10-CM | POA: Diagnosis not present

## 2018-07-30 DIAGNOSIS — E039 Hypothyroidism, unspecified: Secondary | ICD-10-CM | POA: Diagnosis not present

## 2018-07-30 DIAGNOSIS — N3281 Overactive bladder: Secondary | ICD-10-CM | POA: Diagnosis not present

## 2018-07-30 DIAGNOSIS — M8580 Other specified disorders of bone density and structure, unspecified site: Secondary | ICD-10-CM | POA: Diagnosis not present

## 2018-07-30 DIAGNOSIS — G2581 Restless legs syndrome: Secondary | ICD-10-CM | POA: Diagnosis not present

## 2018-08-16 NOTE — Progress Notes (Deleted)
Elizabeth [name] was seen today in the movement disorders clinic for neurologic consultation at the request of Burns, Claudina Lick, MD.  The consultation is for the evaluation of tremor and to r/o PD.  The tremor is in the L hand and is noted if she picks up something.  She is right hand dominant.  She has noted tremor for max of 2 years.  It really has not changed with time.  No family hx of tremor.  Denies leg tremor or head tremor.     Specific Symptoms:  Tremor: Yes.     Affected by caffeine:  No. (1 cup coffee per day)  Affected by alcohol:  No. (only drinks at christmas)  Affected by stress:  Yes.    Affected by fatigue:  Yes.    Spills soup if on spoon:  No. (involves non dominant hand)  Spills glass of liquid if full:  No. (involves non dominant hand)  Affects ADL's (tying shoes, brushing teeth, etc):  No.   By far her biggest c/o is inability to sit comfortably because of the need to move the legs when sitting.  "If I don't move them, I feel like I will scream."  It generally happens when she tries to watch TV.  She was told by her dermatologist that it was due to venous insufficiency.  If she gets up and moves around the sx's are better.  She can sit and play bridge and scrabble for hours without problem but cannot watch TV without getting up and moving around  Any other sx's:   Voice: no change Sleep: trouble getting asleep because legs are not comfortable; "part of it may be my nervous"  Vivid Dreams:  Yes.  , "I feel like my mother is in the room with me and she is deceased"  Acting out dreams:  No., but "I live by myself" Wet Pillows: No. Postural symptoms:  Yes.    Falls?  Yes.  ; went to ED on 12/13/14 after a fall between the commode and the wall and she hit her head.  She drove herself to the ED.  States that she had a similar fall in 2013. Bradykinesia symptoms: difficulty getting out of a chair Loss of smell:  No. Loss of taste:  No. Urinary Incontinence:  Yes.  , bladder  suspension 20 years ago but gotten worse again (on myrbetriq) Difficulty Swallowing:  No. Handwriting, micrographia: No. Trouble buttoning clothing: Yes.   Depression:  Yes.   (worse since retirement) Memory changes:  No. Hallucinations:  No.  visual distortions: No. N/V:  No. Lightheaded:  No.  Syncope: No. Diplopia:  No. Dyskinesia:  No.   09/13/15 update:  The patient returns today for follow-up.  Last visit, I started her on low-dose Mirapex, 0.125 mg for her restless leg. Pt states that the medication really helped and "I need it for the rest of my days."   She had some blood work and she was anemic with a hemoglobin of 11.  I had intended to have her iron stores/TIBC drawn but for some reason that was not done.  Neuroimaging has previously been performed.  It is available for my review today.  Had a CT brain in the ED in Jan 2016 after a fall.  There was an old infact in the R occipito-parietal region and L caudate region; there was small vessel disease and atrophy.  03/13/16 update:  The patient is following up today and has a history  of restless leg syndrome.  She is on Mirapex, 0.125 mg.  She was slightly anemic when I checked her hemoglobin last October (1.6) and her primary care physician rechecked this in February and her hemoglobin was 11.0.  Overall, this has been stable for quite some time.  Her iron stores and iron saturation were good in October.  With the medication, she has been sleeping well.  Her biggest c/o is neck pain x several months.  She saw Dr. Noemi Chapel and states that she had a cortisone injection but that was in the right shoulder.  Sometimes, the pain radiates in the neck.  X rays were done of the cervical spine but no other neuroimaging.  No PT for the neck.     04/24/16 update:  The patient is following up today.  Last visit, she looked more parkinsonian than previous visits, so I decided to start her on a trial of carbidopa/levodopa 25/100, one tablet 3 times per day.   Pt states this definitely helped.   She is also on Mirapex 0.125 mg at night.  She states that it is helping but is having some sx during the day and it drives her nuts; the last time she played bingo, she had to keep getting up.  She denies any falls since last visit.  No lightheadedness or near syncope.  No hallucinations.  She did have an MRI of the cervical spine that demonstrated a disc protrusion at C5-C6 level.  There was bilateral moderate neural foraminal stenosis at the C3/C4 level and C5/C6 level.  She was referred to physical therapy.  She has an appt upcoming.  States that she has had "neck popping" but no significant pain.  Asks me for a neck brace because she feels that she is humping over some and has trouble keeping neck up.    09/18/16 update:  The patient follows up today, on carbidopa/levodopa 25/100, one tablet 3 times per day.  She is also on pramipexole, 0.125 mg twice a day for restless leg syndrome.  She denies any cognitive change.  She denies compulsive behaviors.  She denies sleep attacks.  She denies falls.  She denies lightheadedness or near syncope.  No hallucinations.  He has been attending physical therapy for cervicalgia since our last visit.  It helped that and her balance and strength.  She states "I need more."  I have reviewed those records.  01/08/17 update:   Patient follows up today.  The patient is on carbidopa/levodopa, one tablet 3 times per day.  She is having trouble swallowing this and "I about gag."  Asks me if there is something else she can try.   She is also on pramipexole 0.125 mg twice per day for restless leg.  Overall, the patient states that she has been doing well since our last visit.  She denies lightheadedness or near syncope.  No hallucinations.  No cognitive change.  No falls.  11/10/17 update: Patient seen in follow-up today for her Parkinson's disease. She is accompanied by her nephew who supplements the history.   I have not seen her since  February as she has canceled or no showed multiple appointments since last visit.  Last visit, I tried to change her from immediate release carbidopa/levodopa 25/100 to rytary 145 mg 3 times per day.  This is primarily because of swallowing issues with immediate release medication.  I have not heard from her since I did this.  I have reviewed an extensive number of records since  her last visit.  She was in the emergency room in July because of a fall when she was in the bathroom.  It turns out that the patient had a urinary tract infection.  It was also revealed that the patient was only taking her rytary once per day.  Home health went to her house and the patient was refusing her medication.  On June 29, 2017, a fall was noted in her medical records.  The patient went to the emergency room on September 11, 2017 after a fall and required sutures.  She was admitted on that day because of frequent falls.  CT revealed a small left-sided subdural hygroma, but notes indicated that neurosurgery felt that this was stable.  Patient admitted that she was not taking her rytary.  She was discharged to [facility] for skilled rehab and just recently discharged home.  The only neurologic medication that she was on then was pramipexole, 0.125 mg twice per day and that is for restless leg.  She reports today that "I thought that I was taking my levodopa."  She admits that it may be once per day.  Also admits that she is still using the sample bottle I gave her a year ago.  Does state that rytary is easier to swallow than carbidopa/levodopa IR.   Lives alone.  Has someone come in on fridays to assist with bathing.  Had someone come into the home to lower the phone and coat closet hangers.  Has trouble getting out of the chair.  03/16/18 update: Patient seen today in follow-up for Parkinson's disease.  She is accompanied by her nephew who supplements the history.  Last visit, encouraged her to take her Rytary 145 mg, 1 tablet 3  times per day.  She asks about inbrija.  We decreased her pramipexole 0.125 mg twice per day to 1 tablet/day at bedtime for restless leg.   This worked well. Also discussed her living/home situation.  The records that were made available to me were reviewed.  Last saw PCP on 03/02/18.  Also saw Dr. Gershon Crane since our last visit.  She complained to him about diplopia.  It is reported that there were no intraocular issues and was told to follow-up here.  She states that it is only when she looks up and is only the left eye (she thinks).  She thinks that it is binocular.  Lasts until she stops looking up. No falls.  She is doing exercises that her PT left her to do.  She is using a hot compress on her neck and it is helping her pain.    08/17/18 update: Patient is seen today in follow-up for Parkinson's disease.  Her Rytary was increased last visit to Rytary 145 mg, 2/2/1.  She reports that ***.  She is on low-dose pramipexole 0.125 mg, 1 tablet at night for restless leg and that seems to be working.  Records are reviewed since her last visit.  She was in the emergency room on May 23, 2018 after a fall.  This required sutures in the hand.  She was discharged home, but felt weak and could not get out of her car and was brought back to the emergency room.  She was ultimately seen by social work and placement in a subacute nursing facility was recommended for rehab.  She spent a few weeks in rehab and was discharged home.  ALLERGIES:   Allergies  Allergen Reactions  . Atorvastatin Other (See Comments)  REACTION: Felt funny (only way patient could describe)  . Crestor [Rosuvastatin Calcium] Other (See Comments)    Leg cramps, muscle aches  . Vesicare [Solifenacin]     constipation    CURRENT MEDICATIONS:  Outpatient Encounter Medications as of 08/17/2018  Medication Sig  . aspirin 81 MG tablet Take 81 mg by mouth daily.    . Calcium Carbonate-Vitamin D (CALCIUM + D PO) Take 1 tablet by mouth daily. 600-400   . Carbidopa-Levodopa ER (RYTARY) 36.25-145 MG CPCR Take 1 tablet by mouth 3 (three) times daily.  . furosemide (LASIX) 20 MG tablet Take 0.5 tablets (10 mg total) by mouth daily.  Marland Kitchen levothyroxine (SYNTHROID, LEVOTHROID) 125 MCG tablet Take 1 tablet (125 mcg total) by mouth daily.  . pramipexole (MIRAPEX) 0.125 MG tablet TAKE ONE TABLET AT BEDTIME.  Marland Kitchen saccharomyces boulardii (FLORASTOR) 250 MG capsule Take 250 mg by mouth daily.  . Simethicone (GAS-X EXTRA STRENGTH) 125 MG CAPS Take 125 mg by mouth daily as needed (gas).  . vitamin B-12 (CYANOCOBALAMIN) 1000 MCG tablet Take 1 tablet (1,000 mcg total) by mouth daily.   No facility-administered encounter medications on file as of 08/17/2018.     PAST MEDICAL HISTORY:   Past Medical History:  Diagnosis Date  . Abnormal Pap smear of vagina 09/2006   Dr Nori Riis  . Abnormality of gait 05/24/2015   PMH R occipital infarct B12 deficiency H/o polio affected left leg  In Physical Therapy to improve balance & strength through [facility]  . Arthritis 12/25/2015  . Cervicalgia 01/17/2009   MRI 03/27/16:  IMPRESSION: 1. At C3-4 there is a mild broad-based disc bulge. Moderate bilateral facet arthropathy and uncovertebral degenerative changes resulting in moderate bilateral foraminal stenosis. 2. At C5-6 there is a broad-based disc osteophyte complex. Bilateral uncovertebral degenerative changes and facet arthropathy resulting in moderate bilateral foraminal stenosis.  . Fall 09/11/2017  . History of CVA (cerebrovascular accident) 07/20/2012   Chronic R occipital infarct ; stable on CT 05/21/2012 .Dr Jannifer Franklin , Neurology   . History of TIA (transient ischemic attack) 01/26/2008  . HYPERLIPIDEMIA 01/26/2008   NMR Lipoprofile 2009: LDL 135 (1704/911), HDL 62, TG 131. LDL goal = < 100; ideally < 70. MGF MI in 66s No FH CVA  . Hypothyroidism   . Intention tremor 12/08/2013   Onset after fall 04/2012   . Irritable bowel syndrome (IBS) 01/17/2009   Dr. Unice Cobble  .  Osteopenia    PMH fracture heel, wrist  . Parkinson disease ([place]) 06/25/2016   Dr Paizley Ramella  . Pernicious anemia   . Post-polio syndrome    minor problems L leg  . Restless leg syndrome 12/25/2015   Dr Taiwan Millon  . Subdural hygroma 09/11/2017  . Swallowing disorder 03/04/2017  . Urinary incontinence 09/07/2008   Dr McDiarmid, Urology   . Vitamin B12 deficiency   . Weakness 06/24/2017    PAST SURGICAL HISTORY:   Past Surgical History:  Procedure Laterality Date  . BLADDER SUSPENSION  2000  . CATARACT EXTRACTION, BILATERAL     Dr. Gershon Crane  . no colonoscopy     "I never felt I needed one "  . YAG LASER APPLICATION Left    Dr. Gershon Crane    SOCIAL HISTORY:   Social History   Socioeconomic History  . Marital status: Single    Spouse name: Not on file  . Number of children: Not on file  . Years of education: Not on file  . Highest education level: Not on  file  Occupational History  . Not on file  Social Needs  . Financial resource strain: Not on file  . Food insecurity:    Worry: Not on file    Inability: Not on file  . Transportation needs:    Medical: Not on file    Non-medical: Not on file  Tobacco Use  . Smoking status: Never Smoker  . Smokeless tobacco: Never Used  Substance and Sexual Activity  . Alcohol use: Yes    Alcohol/week: 1.0 standard drinks    Types: 1 Glasses of wine per week    Comment: 0.6 oz per week  . Drug use: No  . Sexual activity: Not on file  Lifestyle  . Physical activity:    Days per week: Not on file    Minutes per session: Not on file  . Stress: Not on file  Relationships  . Social connections:    Talks on phone: Not on file    Gets together: Not on file    Attends religious service: Not on file    Active member of club or organization: Not on file    Attends meetings of clubs or organizations: Not on file    Relationship status: Not on file  . Intimate partner violence:    Fear of current or ex partner: Not on file    Emotionally abused: Not  on file    Physically abused: Not on file    Forced sexual activity: Not on file  Other Topics Concern  . Not on file  Social History Narrative  . Not on file    FAMILY HISTORY:   Family Status  Relation Name Status  . MGF  Deceased at age 20  . Mother  Deceased       emphysema, HTN  . Father  Deceased       alzheimer's  . Brother  Deceased       drown  . MGM  (Not Specified)  . Neg Hx  (Not Specified)    ROS:  A complete 10 system review of systems was obtained and was unremarkable apart from what is mentioned above.  PHYSICAL EXAMINATION:    VITALS:   There were no vitals filed for this visit.  GEN:  The patient appears stated age and is in NAD. HEENT:  Normocephalic, atraumatic.  The mucous membranes are moist. The superficial temporal arteries are without ropiness or tenderness. CV:  RRR Lungs:  CTAB Neck/HEME:  There are no carotid bruits bilaterally.  The neck is flexed.  The chin is on the chest.  Neurological examination:  Orientation:  Montreal Cognitive Assessment  11/10/2017  Visuospatial/ Executive (0/5) 0  Naming (0/3) 3  Attention: Read list of digits (0/2) 2  Attention: Read list of letters (0/1) 1  Attention: Serial 7 subtraction starting at 100 (0/3) 2  Language: Repeat phrase (0/2) 2  Language : Fluency (0/1) 1  Abstraction (0/2) 1  Delayed Recall (0/5) 3  Orientation (0/6) 6  Total 21  Adjusted Score (based on education) 21   Cranial nerves: There is good facial symmetry. There is facial hypomimia. The visual fields are full to confrontational testing. The speech is fluent and clear but hypophonic. Soft palate rises symmetrically and there is no tongue deviation. Hearing is intact to conversational tone. Sensation: Sensation is intact to light touch throughout Motor: Strength is at least antigravity x 4   Movement examination: Tone: There is mild increased tone in the LUE Abnormal movements:  there is mild left upper extremity resting  tremor. Coordination:  There is some difficulty with hand opening and closing bilaterally and finger taps more on the L. Gait and Station: The patient pushes off of the chair.  She is short stepped.  She hesitates in the doorway.  She is unstable (she reports some of this is because of painful callus and is f/u with podiatry about that)  LABS  Lab Results  Component Value Date   TSH 0.316 (L) 05/23/2018     Chemistry      Component Value Date/Time   NA 139 05/28/2018   K 4.2 05/28/2018   CL 109 05/23/2018 1238   CO2 26 05/23/2018 1238   BUN 13 05/28/2018   CREATININE 0.6 05/28/2018   CREATININE 0.84 05/23/2018 1238   GLU 93 05/28/2018      Component Value Date/Time   CALCIUM 8.3 (L) 05/23/2018 1238   ALKPHOS 101 04/28/2018 1521   AST 23 04/28/2018 1521   ALT 2 04/28/2018 1521   BILITOT 0.7 04/28/2018 1521     Lab Results  Component Value Date   WBC 4.4 05/28/2018   HGB 11.3 (A) 05/28/2018   HCT 33 (A) 05/28/2018   MCV 85.3 05/23/2018   PLT 252 05/28/2018   Lab Results  Component Value Date   IRON 58 04/28/2018   IRON 58 04/28/2018   TIBC 281 09/18/2016   FERRITIN 34.4 04/28/2018     Chemistry      Component Value Date/Time   NA 139 05/28/2018   K 4.2 05/28/2018   CL 109 05/23/2018 1238   CO2 26 05/23/2018 1238   BUN 13 05/28/2018   CREATININE 0.6 05/28/2018   CREATININE 0.84 05/23/2018 1238   GLU 93 05/28/2018      Component Value Date/Time   CALCIUM 8.3 (L) 05/23/2018 1238   ALKPHOS 101 04/28/2018 1521   AST 23 04/28/2018 1521   ALT 2 04/28/2018 1521   BILITOT 0.7 04/28/2018 1521       ASSESSMENT/PLAN:  1.  Parkinsons disease  -She has had multiple falls since our last visit.  This is primarily due to noncompliance, both with follow-up visits as well as medication.  She had stopped all levodopa.  She and I talked about the importance of compliance.  We talked about the risks of falls and the morbidity and mortality associated with these.  She had  trouble swallowing immediate release levodopa, so we had previously switched her to Rytary, which she did not take properly.  she is now taking rytary 145 mg tid.  She needs a higher dosage.  Will increase to rytary 145mg , 2/2/1.    -still think that she should not be living alone  -asks about inbrija.  Discussed details of this but don't think that she needs this  -she asks about referral for PT at home.  [facility] with [facility].   2.  Restless leg syndrome  -on low dose pramipexole 0.125 mg q hs  -anemia may be contributing to this as well 3.  ***

## 2018-08-17 ENCOUNTER — Ambulatory Visit: Payer: Medicare Other | Admitting: Neurology

## 2018-08-17 DIAGNOSIS — S61512D Laceration without foreign body of left wrist, subsequent encounter: Secondary | ICD-10-CM | POA: Diagnosis not present

## 2018-08-17 DIAGNOSIS — Z9181 History of falling: Secondary | ICD-10-CM

## 2018-08-17 DIAGNOSIS — E785 Hyperlipidemia, unspecified: Secondary | ICD-10-CM

## 2018-08-17 DIAGNOSIS — G14 Postpolio syndrome: Secondary | ICD-10-CM

## 2018-08-17 DIAGNOSIS — D649 Anemia, unspecified: Secondary | ICD-10-CM

## 2018-08-17 DIAGNOSIS — N3281 Overactive bladder: Secondary | ICD-10-CM

## 2018-08-17 DIAGNOSIS — M1991 Primary osteoarthritis, unspecified site: Secondary | ICD-10-CM | POA: Diagnosis not present

## 2018-08-17 DIAGNOSIS — K589 Irritable bowel syndrome without diarrhea: Secondary | ICD-10-CM

## 2018-08-17 DIAGNOSIS — G2581 Restless legs syndrome: Secondary | ICD-10-CM

## 2018-08-17 DIAGNOSIS — G20 Parkinson's disease: Secondary | ICD-10-CM | POA: Diagnosis not present

## 2018-08-17 DIAGNOSIS — F419 Anxiety disorder, unspecified: Secondary | ICD-10-CM

## 2018-08-17 DIAGNOSIS — Z7982 Long term (current) use of aspirin: Secondary | ICD-10-CM

## 2018-08-17 DIAGNOSIS — M542 Cervicalgia: Secondary | ICD-10-CM | POA: Diagnosis not present

## 2018-08-17 DIAGNOSIS — Z8673 Personal history of transient ischemic attack (TIA), and cerebral infarction without residual deficits: Secondary | ICD-10-CM

## 2018-08-17 DIAGNOSIS — R296 Repeated falls: Secondary | ICD-10-CM

## 2018-08-17 DIAGNOSIS — E039 Hypothyroidism, unspecified: Secondary | ICD-10-CM

## 2018-08-17 DIAGNOSIS — M8580 Other specified disorders of bone density and structure, unspecified site: Secondary | ICD-10-CM

## 2018-08-17 DIAGNOSIS — E538 Deficiency of other specified B group vitamins: Secondary | ICD-10-CM

## 2018-08-25 ENCOUNTER — Ambulatory Visit: Payer: Medicare Other | Admitting: Podiatry

## 2018-09-01 DIAGNOSIS — M816 Localized osteoporosis [Lequesne]: Secondary | ICD-10-CM | POA: Diagnosis not present

## 2018-09-01 DIAGNOSIS — N958 Other specified menopausal and perimenopausal disorders: Secondary | ICD-10-CM | POA: Diagnosis not present

## 2018-09-22 ENCOUNTER — Encounter: Payer: Self-pay | Admitting: Podiatry

## 2018-09-22 ENCOUNTER — Ambulatory Visit: Payer: Medicare Other | Admitting: Neurology

## 2018-09-22 ENCOUNTER — Ambulatory Visit: Payer: Medicare Other | Admitting: Podiatry

## 2018-09-22 DIAGNOSIS — L84 Corns and callosities: Secondary | ICD-10-CM

## 2018-09-23 ENCOUNTER — Ambulatory Visit: Payer: Medicare Other | Admitting: Neurology

## 2018-09-23 NOTE — Progress Notes (Signed)
Subjective:   Patient ID: Elizabeth [name], female   DOB: 84 y.o.   MRN: [id number]   HPI Patient presents with significant keratotic lesion plantar aspect left and right foot   ROS      Objective:  Physical Exam  Neurovascular status unchanged with thick keratotic lesions plantar bilateral     Assessment:  Plantar keratotic lesion painful bilateral     Plan:  Debride painful lesion with no iatrogenic bleeding bilateral third metatarsal head

## 2018-09-24 ENCOUNTER — Ambulatory Visit: Payer: Medicare Other | Admitting: Neurology

## 2018-09-30 ENCOUNTER — Telehealth: Payer: Self-pay | Admitting: Neurology

## 2018-09-30 NOTE — Telephone Encounter (Signed)
Spoke with patient - she wants to know if Dr. Carles Collet would just order therapy for her to get her strength up before she sees her and she would just cancel appt. I made her aware Dr. Carles Collet would need to see patient within 6 months of signing PT orders. She will keep appt next week.

## 2018-09-30 NOTE — Telephone Encounter (Signed)
Patient called and left a vm wanting to speak with the nurse before her next appt on 11/12. Please call her back at [phone]. Thanks!

## 2018-10-04 NOTE — Progress Notes (Signed)
Elizabeth [name] was seen today in the movement disorders clinic for neurologic consultation at the request of Burns, Claudina Lick, MD.  The consultation is for the evaluation of tremor and to r/o PD.  The tremor is in the L hand and is noted if she picks up something.  She is right hand dominant.  She has noted tremor for max of 2 years.  It really has not changed with time.  No family hx of tremor.  Denies leg tremor or head tremor.     Specific Symptoms:  Tremor: Yes.     Affected by caffeine:  No. (1 cup coffee per day)  Affected by alcohol:  No. (only drinks at christmas)  Affected by stress:  Yes.    Affected by fatigue:  Yes.    Spills soup if on spoon:  No. (involves non dominant hand)  Spills glass of liquid if full:  No. (involves non dominant hand)  Affects ADL's (tying shoes, brushing teeth, etc):  No.   By far her biggest c/o is inability to sit comfortably because of the need to move the legs when sitting.  "If I don't move them, I feel like I will scream."  It generally happens when she tries to watch TV.  She was told by her dermatologist that it was due to venous insufficiency.  If she gets up and moves around the sx's are better.  She can sit and play bridge and scrabble for hours without problem but cannot watch TV without getting up and moving around  Any other sx's:   Voice: no change Sleep: trouble getting asleep because legs are not comfortable; "part of it may be my nervous"  Vivid Dreams:  Yes.  , "I feel like my mother is in the room with me and she is deceased"  Acting out dreams:  No., but "I live by myself" Wet Pillows: No. Postural symptoms:  Yes.    Falls?  Yes.  ; went to ED on 12/13/14 after a fall between the commode and the wall and she hit her head.  She drove herself to the ED.  States that she had a similar fall in 2013. Bradykinesia symptoms: difficulty getting out of a chair Loss of smell:  No. Loss of taste:  No. Urinary Incontinence:  Yes.  , bladder  suspension 20 years ago but gotten worse again (on myrbetriq) Difficulty Swallowing:  No. Handwriting, micrographia: No. Trouble buttoning clothing: Yes.   Depression:  Yes.   (worse since retirement) Memory changes:  No. Hallucinations:  No.  visual distortions: No. N/V:  No. Lightheaded:  No.  Syncope: No. Diplopia:  No. Dyskinesia:  No.   09/13/15 update:  The patient returns today for follow-up.  Last visit, I started her on low-dose Mirapex, 0.125 mg for her restless leg. Pt states that the medication really helped and "I need it for the rest of my days."   She had some blood work and she was anemic with a hemoglobin of 11.  I had intended to have her iron stores/TIBC drawn but for some reason that was not done.  Neuroimaging has previously been performed.  It is available for my review today.  Had a CT brain in the ED in Jan 2016 after a fall.  There was an old infact in the R occipito-parietal region and L caudate region; there was small vessel disease and atrophy.  03/13/16 update:  The patient is following up today and has a history  of restless leg syndrome.  She is on Mirapex, 0.125 mg.  She was slightly anemic when I checked her hemoglobin last October (1.6) and her primary care physician rechecked this in February and her hemoglobin was 11.0.  Overall, this has been stable for quite some time.  Her iron stores and iron saturation were good in October.  With the medication, she has been sleeping well.  Her biggest c/o is neck pain x several months.  She saw Dr. Noemi Chapel and states that she had a cortisone injection but that was in the right shoulder.  Sometimes, the pain radiates in the neck.  X rays were done of the cervical spine but no other neuroimaging.  No PT for the neck.     04/24/16 update:  The patient is following up today.  Last visit, she looked more parkinsonian than previous visits, so I decided to start her on a trial of carbidopa/levodopa 25/100, one tablet 3 times per day.   Pt states this definitely helped.   She is also on Mirapex 0.125 mg at night.  She states that it is helping but is having some sx during the day and it drives her nuts; the last time she played bingo, she had to keep getting up.  She denies any falls since last visit.  No lightheadedness or near syncope.  No hallucinations.  She did have an MRI of the cervical spine that demonstrated a disc protrusion at C5-C6 level.  There was bilateral moderate neural foraminal stenosis at the C3/C4 level and C5/C6 level.  She was referred to physical therapy.  She has an appt upcoming.  States that she has had "neck popping" but no significant pain.  Asks me for a neck brace because she feels that she is humping over some and has trouble keeping neck up.    09/18/16 update:  The patient follows up today, on carbidopa/levodopa 25/100, one tablet 3 times per day.  She is also on pramipexole, 0.125 mg twice a day for restless leg syndrome.  She denies any cognitive change.  She denies compulsive behaviors.  She denies sleep attacks.  She denies falls.  She denies lightheadedness or near syncope.  No hallucinations.  He has been attending physical therapy for cervicalgia since our last visit.  It helped that and her balance and strength.  She states "I need more."  I have reviewed those records.  01/08/17 update:   Patient follows up today.  The patient is on carbidopa/levodopa, one tablet 3 times per day.  She is having trouble swallowing this and "I about gag."  Asks me if there is something else she can try.   She is also on pramipexole 0.125 mg twice per day for restless leg.  Overall, the patient states that she has been doing well since our last visit.  She denies lightheadedness or near syncope.  No hallucinations.  No cognitive change.  No falls.  11/10/17 update: Patient seen in follow-up today for her Parkinson's disease. She is accompanied by her nephew who supplements the history.   I have not seen her since  February as she has canceled or no showed multiple appointments since last visit.  Last visit, I tried to change her from immediate release carbidopa/levodopa 25/100 to rytary 145 mg 3 times per day.  This is primarily because of swallowing issues with immediate release medication.  I have not heard from her since I did this.  I have reviewed an extensive number of records since  her last visit.  She was in the emergency room in July because of a fall when she was in the bathroom.  It turns out that the patient had a urinary tract infection.  It was also revealed that the patient was only taking her rytary once per day.  Home health went to her house and the patient was refusing her medication.  On June 29, 2017, a fall was noted in her medical records.  The patient went to the emergency room on September 11, 2017 after a fall and required sutures.  She was admitted on that day because of frequent falls.  CT revealed a small left-sided subdural hygroma, but notes indicated that neurosurgery felt that this was stable.  Patient admitted that she was not taking her rytary.  She was discharged to [facility] for skilled rehab and just recently discharged home.  The only neurologic medication that she was on then was pramipexole, 0.125 mg twice per day and that is for restless leg.  She reports today that "I thought that I was taking my levodopa."  She admits that it may be once per day.  Also admits that she is still using the sample bottle I gave her a year ago.  Does state that rytary is easier to swallow than carbidopa/levodopa IR.   Lives alone.  Has someone come in on fridays to assist with bathing.  Had someone come into the home to lower the phone and coat closet hangers.  Has trouble getting out of the chair.  03/16/18 update: Patient seen today in follow-up for Parkinson's disease.  She is accompanied by her nephew who supplements the history.  Last visit, encouraged her to take her Rytary 145 mg, 1 tablet 3  times per day.  She asks about inbrija.  We decreased her pramipexole 0.125 mg twice per day to 1 tablet/day at bedtime for restless leg.   This worked well. Also discussed her living/home situation.  The records that were made available to me were reviewed.  Last saw PCP on 03/02/18.  Also saw Dr. Gershon Crane since our last visit.  She complained to him about diplopia.  It is reported that there were no intraocular issues and was told to follow-up here.  She states that it is only when she looks up and is only the left eye (she thinks).  She thinks that it is binocular.  Lasts until she stops looking up. No falls.  She is doing exercises that her PT left her to do.  She is using a hot compress on her neck and it is helping her pain.    10/05/18 update: Patient is seen today in follow-up for Parkinson's disease.   Last visit, Rytary was increased to Rytary 145 mg, 2 tablets in the morning, 2 in the afternoon and 1 in the evening.  She didn't increase the dosage, however, and is only still taking 1 three times per day.  Reports that she last took it today at 7am (seen at 3:30pm - should have had middle of the day dose in). [facility].  She no longer drives.  A friend drove her to the appt today per pt (turns out is was [facility]).  [facility] comes into the home 7 days a week for 3 hours per day.  Had a fall about a month ago.  On pramipexole 0.125 mg q hs. "that is the best medicine that I have."   Friend called and left a  VM message just prior to the appt that pt was hallucinating and paranoid.  Had cameras installed in home as thought that people were in house and had friend review them with her and no one was there.  Pt denies this today.    ALLERGIES:   Allergies  Allergen Reactions  . Atorvastatin Other (See Comments)    REACTION: Felt funny (only way patient could describe)  . Crestor [Rosuvastatin Calcium] Other (See Comments)    Leg cramps, muscle aches  . Vesicare  [Solifenacin]     constipation    CURRENT MEDICATIONS:  Outpatient Encounter Medications as of 10/05/2018  Medication Sig  . aspirin 81 MG tablet Take 81 mg by mouth daily.    . Calcium Carbonate-Vitamin D (CALCIUM + D PO) Take 1 tablet by mouth daily. 600-400  . Carbidopa-Levodopa ER (RYTARY) 36.25-145 MG CPCR Take 1 tablet by mouth 3 (three) times daily.  Marland Kitchen levothyroxine (SYNTHROID, LEVOTHROID) 125 MCG tablet Take 1 tablet (125 mcg total) by mouth daily.  . pramipexole (MIRAPEX) 0.125 MG tablet TAKE ONE TABLET AT BEDTIME.  . Simethicone (GAS-X EXTRA STRENGTH) 125 MG CAPS Take 125 mg by mouth daily as needed (gas).  . vitamin B-12 (CYANOCOBALAMIN) 1000 MCG tablet Take 1 tablet (1,000 mcg total) by mouth daily.  . [DISCONTINUED] furosemide (LASIX) 20 MG tablet Take 0.5 tablets (10 mg total) by mouth daily.  . [DISCONTINUED] saccharomyces boulardii (FLORASTOR) 250 MG capsule Take 250 mg by mouth daily.   No facility-administered encounter medications on file as of 10/05/2018.     PAST MEDICAL HISTORY:   Past Medical History:  Diagnosis Date  . Abnormal Pap smear of vagina 09/2006   Dr Nori Riis  . Abnormality of gait 05/24/2015   PMH R occipital infarct B12 deficiency H/o polio affected left leg  In Physical Therapy to improve balance & strength through [facility]  . Arthritis 12/25/2015  . Cervicalgia 01/17/2009   MRI 03/27/16:  IMPRESSION: 1. At C3-4 there is a mild broad-based disc bulge. Moderate bilateral facet arthropathy and uncovertebral degenerative changes resulting in moderate bilateral foraminal stenosis. 2. At C5-6 there is a broad-based disc osteophyte complex. Bilateral uncovertebral degenerative changes and facet arthropathy resulting in moderate bilateral foraminal stenosis.  . Fall 09/11/2017  . History of CVA (cerebrovascular accident) 07/20/2012   Chronic R occipital infarct ; stable on CT 05/21/2012 .Dr Jannifer Franklin , Neurology   . History of TIA (transient ischemic attack)  01/26/2008  . HYPERLIPIDEMIA 01/26/2008   NMR Lipoprofile 2009: LDL 135 (1704/911), HDL 62, TG 131. LDL goal = < 100; ideally < 70. MGF MI in 40s No FH CVA  . Hypothyroidism   . Intention tremor 12/08/2013   Onset after fall 04/2012   . Irritable bowel syndrome (IBS) 01/17/2009   Dr. Unice Cobble  . Osteopenia    PMH fracture heel, wrist  . Parkinson disease ([place]) 06/25/2016   Dr Jarick Harkins  . Pernicious anemia   . Post-polio syndrome    minor problems L leg  . Restless leg syndrome 12/25/2015   Dr Elam Ellis  . Subdural hygroma 09/11/2017  . Swallowing disorder 03/04/2017  . Urinary incontinence 09/07/2008   Dr McDiarmid, Urology   . Vitamin B12 deficiency   . Weakness 06/24/2017    PAST SURGICAL HISTORY:   Past Surgical History:  Procedure Laterality Date  . BLADDER SUSPENSION  2000  . CATARACT EXTRACTION, BILATERAL     Dr. Gershon Crane  . no colonoscopy     "I never felt  I needed one "  . YAG LASER APPLICATION Left    Dr. Gershon Crane    SOCIAL HISTORY:   Social History   Socioeconomic History  . Marital status: Single    Spouse name: Not on file  . Number of children: Not on file  . Years of education: Not on file  . Highest education level: Not on file  Occupational History  . Not on file  Social Needs  . Financial resource strain: Not on file  . Food insecurity:    Worry: Not on file    Inability: Not on file  . Transportation needs:    Medical: Not on file    Non-medical: Not on file  Tobacco Use  . Smoking status: Never Smoker  . Smokeless tobacco: Never Used  Substance and Sexual Activity  . Alcohol use: Yes    Alcohol/week: 1.0 standard drinks    Types: 1 Glasses of wine per week    Comment: 0.6 oz per week  . Drug use: No  . Sexual activity: Not on file  Lifestyle  . Physical activity:    Days per week: Not on file    Minutes per session: Not on file  . Stress: Not on file  Relationships  . Social connections:    Talks on phone: Not on file    Gets together: Not on  file    Attends religious service: Not on file    Active member of club or organization: Not on file    Attends meetings of clubs or organizations: Not on file    Relationship status: Not on file  . Intimate partner violence:    Fear of current or ex partner: Not on file    Emotionally abused: Not on file    Physically abused: Not on file    Forced sexual activity: Not on file  Other Topics Concern  . Not on file  Social History Narrative  . Not on file    FAMILY HISTORY:   Family Status  Relation Name Status  . MGF  Deceased at age 41  . Mother  Deceased       emphysema, HTN  . Father  Deceased       alzheimer's  . Brother  Deceased       drown  . MGM  (Not Specified)  . Neg Hx  (Not Specified)    ROS:  A complete 10 system review of systems was obtained and was unremarkable apart from what is mentioned above.  PHYSICAL EXAMINATION:    VITALS:   Vitals:   10/05/18 1516  BP: 110/68  Pulse: 92  SpO2: 98%  Weight: 84 lb (38.1 kg)    GEN:  The patient appears stated age and is in NAD. HEENT:  Normocephalic, atraumatic.  The mucous membranes are moist. The superficial temporal arteries are without ropiness or tenderness. CV:  RRR Lungs:  CTAB Neck/HEME:  There are no carotid bruits bilaterally.  The neck is flexed.  The chin is on the chest.  Neurological examination:  Orientation:  Montreal Cognitive Assessment  10/05/2018 11/10/2017  Visuospatial/ Executive (0/5) 1 0  Naming (0/3) 2 3  Attention: Read list of digits (0/2) 2 2  Attention: Read list of letters (0/1) 0 1  Attention: Serial 7 subtraction starting at 100 (0/3) 2 2  Language: Repeat phrase (0/2) 2 2  Language : Fluency (0/1) 1 1  Abstraction (0/2) 2 1  Delayed Recall (0/5) 1 3  Orientation (0/6) 5  6  Total 18 21  Adjusted Score (based on education) 18 21    Cranial nerves: There is good facial symmetry. The speech is fluent and clear. Soft palate rises symmetrically and there is no tongue  deviation. Hearing is intact to conversational tone. Sensation: Sensation is intact to light touch throughout Motor: Strength is at least antigravity x4.  Movement examination: Tone: There is mild increased tone in the left upper extremity. Abnormal movements: There is left upper extremity resting tremor, overall mild. Coordination:  There is she has some trouble with hand opening and closing bilaterally.  She has trouble with finger taps, more on the left than right.   Gait and Station: The patient pushes off of the chair to arise.  She is short stepped.  She has some start hesitation and hesitates in the doorways.     LABS  Lab Results  Component Value Date   TSH 0.316 (L) 05/23/2018     Chemistry      Component Value Date/Time   NA 139 05/28/2018   K 4.2 05/28/2018   CL 109 05/23/2018 1238   CO2 26 05/23/2018 1238   BUN 13 05/28/2018   CREATININE 0.6 05/28/2018   CREATININE 0.84 05/23/2018 1238   GLU 93 05/28/2018      Component Value Date/Time   CALCIUM 8.3 (L) 05/23/2018 1238   ALKPHOS 101 04/28/2018 1521   AST 23 04/28/2018 1521   ALT 2 04/28/2018 1521   BILITOT 0.7 04/28/2018 1521     Lab Results  Component Value Date   WBC 4.4 05/28/2018   HGB 11.3 (A) 05/28/2018   HCT 33 (A) 05/28/2018   MCV 85.3 05/23/2018   PLT 252 05/28/2018   Lab Results  Component Value Date   IRON 58 04/28/2018   IRON 58 04/28/2018   TIBC 281 09/18/2016   FERRITIN 34.4 04/28/2018     Chemistry      Component Value Date/Time   NA 139 05/28/2018   K 4.2 05/28/2018   CL 109 05/23/2018 1238   CO2 26 05/23/2018 1238   BUN 13 05/28/2018   CREATININE 0.6 05/28/2018   CREATININE 0.84 05/23/2018 1238   GLU 93 05/28/2018      Component Value Date/Time   CALCIUM 8.3 (L) 05/23/2018 1238   ALKPHOS 101 04/28/2018 1521   AST 23 04/28/2018 1521   ALT 2 04/28/2018 1521   BILITOT 0.7 04/28/2018 1521       ASSESSMENT/PLAN:  1.  Parkinsons disease with PDD  -Patient has  chronically had issues with compliance, with visits and with taking medication properly.  She is supposed to be on Rytary 145 mg, 2 tablets in the morning, 2 in the afternoon and 1 in the evening.  She instead is taking 1 tablet 3 times per day, but was seen at 3:30 PM today and still had not taken her second dose of medicine.  I am glad to see that she does have [facility] coming into the home for a few hours per day as I have been concerned about her living situation, but I am not sure that a few hours is enough.  I am going to put in for a [facility] consult.  -As above, a friend left a message on our voicemail that the patient is becoming confused and having hallucinations and paranoia.  I do not know this friend.  I doubt that pramipexole, 0.125 mg at night is contributing, but I went ahead and stopped that.  The  patient was very frustrated with that as it is the one thing that she actually takes faithfully.  I told her that if she took the Rytary at bedtime it would take care of restless leg and it is more safe in her age group.  -I had her sign a release so we could talk to her caregivers at [place] so that we could get their perspective.  I will have my social worker call them tomorrow.  -Patient asked me about a referral for PT.  I put in a referral today for PT, OT and social work.  Patient does not drive. 2.  Restless leg syndrome  -As above, I stopped the pramipexole.  Anemia may be contributing.  She can use Rytary if symptomatically needs help.  She reports that often times, restless leg is not bothering her. 3.  Much greater than 50% of this visit was spent in counseling and coordinating care.  Total face to face time:  40 min

## 2018-10-05 ENCOUNTER — Telehealth: Payer: Self-pay | Admitting: Neurology

## 2018-10-05 ENCOUNTER — Ambulatory Visit: Payer: Medicare Other | Admitting: Neurology

## 2018-10-05 ENCOUNTER — Encounter: Payer: Self-pay | Admitting: Neurology

## 2018-10-05 VITALS — BP 110/68 | HR 92 | Wt 84.0 lb

## 2018-10-05 DIAGNOSIS — G20 Parkinson's disease: Secondary | ICD-10-CM

## 2018-10-05 DIAGNOSIS — F0280 Dementia in other diseases classified elsewhere without behavioral disturbance: Secondary | ICD-10-CM

## 2018-10-05 NOTE — Patient Instructions (Addendum)
Hold pramipexole for now  We will [facility] to the house for PT

## 2018-10-05 NOTE — Telephone Encounter (Signed)
Patient's friend Rushie Nyhan called and left vm letting us know patient is having a lot of paranoia and hallucinations. She had her install cameras in her house because she thought people were in her house, but friend reviewed the tapes and nothing was there. FYI.

## 2018-10-05 NOTE — Telephone Encounter (Signed)
Spoke with Engineer, manufacturing employee who accompanied patient to appointment, but stayed in the lobby. She states she stays with patient a lot but only in the mornings for a few hours. She has noticed lately she is a lot more needy and forgetful. She will often want her to stand outside of the bathroom while she uses it, waiting for her. She will try to send her to the store for things she has already sent her to the store for. She hasn't mentioned anyone in the house, but employee has noticed more and more cameras appearing in the house.  Dr. Carles ColletJuluis Rainier.   Referral faxed to [facility] for PT/OT/social work to [phone] with confirmation received. They will contact patient to set up.

## 2018-10-06 ENCOUNTER — Encounter: Payer: Self-pay | Admitting: Psychology

## 2018-10-06 MED ORDER — CARBIDOPA-LEVODOPA ER 36.25-145 MG PO CPCR: 1.0000 | ORAL_CAPSULE | Freq: Four times a day (QID) | ORAL | 5 refills | Status: DC

## 2018-10-06 NOTE — Telephone Encounter (Signed)
RX sent to [facility] with extra tablet to take at bedtime.

## 2018-10-06 NOTE — Progress Notes (Unsigned)
Patient is being referred to [facility] due to lack of support and identified needs and care.  Patient has been noncompliant with medications. Cognition, frequent falls, and psychosis from PD is a concern for patient at this time, especially since she does not have any local  relatives or identified caregivers.    She has a paid caregiver from [facility] that comes in for 3 hours each day which is a positive change since the last time I seen her.  This caregiver is providing transportation to medical appointments.  I will contact [facility] as we have a release on file and will ask if there is any pertinent information regarding the patient's care and well-being that is important to know at this time.

## 2018-10-06 NOTE — Telephone Encounter (Signed)
Patient is calling in because she thinks the Rytary medication was to be increased per visit yesterday and she needs a refill sent to the [facility]. Please send this in. Thanks!

## 2018-10-06 NOTE — Telephone Encounter (Signed)
It was 2 visits ago but she never increased it.  So , if she feels that she needs an extra at bedtime since I d/c her pramipexole, she can certainly take it

## 2018-10-06 NOTE — Telephone Encounter (Signed)
Rytary was not increased, correct?

## 2018-10-07 ENCOUNTER — Other Ambulatory Visit: Payer: Self-pay

## 2018-10-07 NOTE — Patient Outreach (Signed)
[facility]) Care Management  10/07/2018  Ilithyia Titzer Hornbaker [birth year] [id number]   Referral Date: 10/06/18 Referral Source: MD referral Referral Reason: Lack of social support,falls, hallucinations   Outreach Attempt: no answer.  HIPAA compliant voice message left.   Plan: RN CM will attempt within 4 business days and send a letter.   Jone Baseman, RN, MSN [facility] Care Management Care Management Coordinator Direct Line [phone] Toll Free: [phone]  Fax: [phone]

## 2018-10-08 ENCOUNTER — Telehealth: Payer: Self-pay | Admitting: Neurology

## 2018-10-08 NOTE — Telephone Encounter (Signed)
Noted  

## 2018-10-08 NOTE — Telephone Encounter (Signed)
Darlina Guys called from [facility] regarding this patient and said that she is ready to start services. Thank you

## 2018-10-11 ENCOUNTER — Other Ambulatory Visit: Payer: Self-pay | Admitting: Licensed Clinical Social Worker

## 2018-10-11 ENCOUNTER — Ambulatory Visit: Payer: Self-pay

## 2018-10-11 ENCOUNTER — Other Ambulatory Visit: Payer: Self-pay

## 2018-10-11 NOTE — Telephone Encounter (Signed)
POA - Joey called to report pt. Has been having periods of confusion, on and off, x 1 week. Also has foul-smelling urine. Is afraid she "has another UTI." Requesting an appointment for tomorrow. Appointment made with her provider.  Reason for Disposition . Bad or foul-smelling urine  Answer Assessment - Initial Assessment Questions 1. SYMPTOM: "What's the main symptom you're concerned about?" (e.g., frequency, incontinence)     Foul smelling urine 2. ONSET: "When did the  Strong odor  start?"      1 week 3. PAIN: "Is there any pain?" If so, ask: "How bad is it?" (Scale: 1-10; mild, moderate, severe)     No pain 4. CAUSE: "What do you think is causing the symptoms?"     Maybe a UTI 5. OTHER SYMPTOMS: "Do you have any other symptoms?" (e.g., fever, flank pain, blood in urine, pain with urination)     No 6. PREGNANCY: "Is there any chance you are pregnant?" "When was your last menstrual period?"     no  Protocols used: URINARY [facility]

## 2018-10-11 NOTE — Progress Notes (Signed)
Subjective:    Patient ID: Elizabeth [name], female    DOB: 1931-11-02, 84 y.o.   MRN: [id number]  HPI The patient is here for an acute visit.  She is here today with a health aide.   Urinary symptoms:  Her symptoms started one month ago.  She has frequent urination, darker urine.  The urine is malodorous.  There has been some confusion at times per the health aide.   She does have a history of UTI's.  She took AZO and that helped.  She denies any fevers, chills, abdominal pain or nausea.  Rash:  It started yesterday.  It is on her back, head and neck.  It itches.  She denies new medications.  She denies any new hair products or face products.  She is not taking anything for the rash or put anything on the rash.   Medications and allergies reviewed with patient and updated if appropriate.  Patient Active Problem List   Diagnosis Date Noted  . Laceration of left wrist 05/30/2018  . Osteoporosis 05/30/2018  . Poor posture 04/28/2018  . Physical deconditioning 04/28/2018  . Anxiety 03/02/2018  . Urinary frequency 03/02/2018  . Foot pain, bilateral 12/21/2017  . Bilateral leg edema 12/02/2017  . Overactive bladder 09/19/2017  . Post-polio syndrome   . Vitamin B12 deficiency   . Fall 09/11/2017  . Subdural hygroma 09/11/2017  . Generalized weakness 06/24/2017  . UTI (urinary tract infection) 06/24/2017  . Swallowing disorder 03/04/2017  . Parkinson disease ([place]) 06/25/2016  . Restless leg syndrome 12/25/2015  . Arthritis 12/25/2015  . Abnormality of gait 05/24/2015  . History of CVA (cerebrovascular accident) 07/20/2012  . Cervicalgia 01/17/2009  . Irritable bowel syndrome (IBS) 01/17/2009  . Urinary incontinence 09/07/2008  . Anemia 04/26/2008  . HYPERLIPIDEMIA 01/26/2008  . History of TIA (transient ischemic attack) 01/26/2008  . Hypothyroidism 01/04/2008  . Osteopenia 01/04/2008  . Abnormal Pap smear of vagina 09/24/2006    Current Outpatient Medications on File Prior  to Visit  Medication Sig Dispense Refill  . aspirin 81 MG tablet Take 81 mg by mouth daily.      . Calcium Carbonate-Vitamin D (CALCIUM + D PO) Take 1 tablet by mouth daily. 600-400    . Carbidopa-Levodopa ER (RYTARY) 36.25-145 MG CPCR Take 1 tablet by mouth 4 (four) times daily. 120 capsule 5  . levothyroxine (SYNTHROID, LEVOTHROID) 125 MCG tablet Take 1 tablet (125 mcg total) by mouth daily. 90 tablet 1  . pramipexole (MIRAPEX) 0.125 MG tablet TAKE ONE TABLET AT BEDTIME. 30 tablet 5  . Simethicone (GAS-X EXTRA STRENGTH) 125 MG CAPS Take 125 mg by mouth daily as needed (gas).    . vitamin B-12 (CYANOCOBALAMIN) 1000 MCG tablet Take 1 tablet (1,000 mcg total) by mouth daily. 90 tablet 3   No current facility-administered medications on file prior to visit.     Past Medical History:  Diagnosis Date  . Abnormal Pap smear of vagina 09/2006   Dr Nori Riis  . Abnormality of gait 05/24/2015   PMH R occipital infarct B12 deficiency H/o polio affected left leg  In Physical Therapy to improve balance & strength through [facility] Gerold  . Arthritis 12/25/2015  . Cervicalgia 01/17/2009   MRI 03/27/16:  IMPRESSION: 1. At C3-4 there is a mild broad-based disc bulge. Moderate bilateral facet arthropathy and uncovertebral degenerative changes resulting in moderate bilateral foraminal stenosis. 2. At C5-6 there is a broad-based disc osteophyte complex. Bilateral uncovertebral degenerative changes and facet  arthropathy resulting in moderate bilateral foraminal stenosis.  . Fall 09/11/2017  . History of CVA (cerebrovascular accident) 07/20/2012   Chronic R occipital infarct ; stable on CT 05/21/2012 .Dr Jannifer Franklin , Neurology   . History of TIA (transient ischemic attack) 01/26/2008  . HYPERLIPIDEMIA 01/26/2008   NMR Lipoprofile 2009: LDL 135 (1704/911), HDL 62, TG 131. LDL goal = < 100; ideally < 70. MGF MI in 1s No FH CVA  . Hypothyroidism   . Intention tremor 12/08/2013   Onset after fall 04/2012   . Irritable bowel  syndrome (IBS) 01/17/2009   Dr. Unice Cobble  . Osteopenia    PMH fracture heel, wrist  . Parkinson disease ([place]) 06/25/2016   Dr Tat  . Pernicious anemia   . Post-polio syndrome    minor problems L leg  . Restless leg syndrome 12/25/2015   Dr Tat  . Subdural hygroma 09/11/2017  . Swallowing disorder 03/04/2017  . Urinary incontinence 09/07/2008   Dr McDiarmid, Urology   . Vitamin B12 deficiency   . Weakness 06/24/2017    Past Surgical History:  Procedure Laterality Date  . BLADDER SUSPENSION  2000  . CATARACT EXTRACTION, BILATERAL     Dr. Gershon Crane  . no colonoscopy     "I never felt I needed one "  . YAG LASER APPLICATION Left    Dr. Gershon Crane    Social History   Socioeconomic History  . Marital status: Single    Spouse name: Not on file  . Number of children: Not on file  . Years of education: Not on file  . Highest education level: Not on file  Occupational History  . Not on file  Social Needs  . Financial resource strain: Not on file  . Food insecurity:    Worry: Not on file    Inability: Not on file  . Transportation needs:    Medical: Not on file    Non-medical: Not on file  Tobacco Use  . Smoking status: Never Smoker  . Smokeless tobacco: Never Used  Substance and Sexual Activity  . Alcohol use: Yes    Alcohol/week: 1.0 standard drinks    Types: 1 Glasses of wine per week    Comment: 0.6 oz per week  . Drug use: No  . Sexual activity: Not on file  Lifestyle  . Physical activity:    Days per week: Not on file    Minutes per session: Not on file  . Stress: Not on file  Relationships  . Social connections:    Talks on phone: Not on file    Gets together: Not on file    Attends religious service: Not on file    Active member of club or organization: Not on file    Attends meetings of clubs or organizations: Not on file    Relationship status: Not on file  Other Topics Concern  . Not on file  Social History Narrative  . Not on file    Family  History  Problem Relation Age of Onset  . Heart attack Maternal Grandfather        70s  . Emphysema Mother   . Hypertension Mother   . Alzheimer's disease Father   . Breast cancer Maternal Grandmother   . Cancer Maternal Grandmother        breast  . Diabetes Neg Hx   . Stroke Neg Hx   . Parkinson's disease Neg Hx     Review of Systems  Constitutional: Negative for  appetite change, chills and fever.  Gastrointestinal: Negative for abdominal pain and nausea.  Genitourinary: Positive for dysuria (sometimes) and frequency. Negative for hematuria.       Darker urine, malodorous urine  Psychiatric/Behavioral: Positive for confusion.       Objective:   Vitals:   10/12/18 1038  BP: 118/70  Resp: 14  Temp: 97.8 F (36.6 C)   BP Readings from Last 3 Encounters:  10/12/18 118/70  10/05/18 110/68  06/07/18 108/64   Wt Readings from Last 3 Encounters:  10/12/18 83 lb 12.8 oz (38 kg)  10/05/18 84 lb (38.1 kg)  06/07/18 96 lb (43.5 kg)   Body mass index is 16.37 kg/m.   Physical Exam  Constitutional: No distress.  Elderly, frail female  HENT:  Head: Normocephalic and atraumatic.  Abdominal: Soft. She exhibits no distension and no mass. There is no tenderness. There is no rebound and no guarding.  Musculoskeletal: She exhibits no edema.  Skin: Skin is warm and dry. Rash (Slightly raised, erythematous blotchy rash posterior neck, along the hairline bilaterally up to forehead and on chest.  No blisters, papules) noted. She is not diaphoretic.           Assessment & Plan:    See Problem List for Assessment and Plan of chronic medical problems.

## 2018-10-11 NOTE — Patient Outreach (Signed)
[place] [facility]) Care Management  10/11/2018  Elizabeth [name] [birth year] [id number]   Referral Date: 10/06/18 Referral Source: MD referral Referral Reason: Lack of social support,falls, hallucinations   Outreach Attempt:spoke with patient.  Discussed reason for referral. Discuss[facility] services.  Patient is agreeable to services.  Patient has Parkinson's disease and incontinence problems.  Patient has had some falls, hallucinations, and paranoia.  She has gradually had changes and physician is concerned about her current living situation.  Per physician report patient has had more cameras installed in her home due to paranoia.  Per patient and physician note she has private caregivers from [employer] increased to come daily to assist with care. However, physician still concerned.   Patient also states that therapy is also coming to see her now per physician order due to safety concerns.  Patient also reports she sees the urologist for urinary incontinence.  Patient states that her caregiver goes to the store for her and takes her to appointments.      Plan: RN CM will refer to community nurse for home assessment and care coordination.   RN CM will refer patient to a social work due to hallucinations, paranoia, low social/family support.   Jone Baseman, RN, MSN [place] Management Care Management Coordinator Direct Line [phone] Cell [phone] Toll Free: [phone]  Fax: [phone]

## 2018-10-11 NOTE — Patient Outreach (Signed)
[facility]) Care Management  10/11/2018  Elizabeth [name] [birth year] [id number]  Assessment-CSW completed outreach attempt today after receiving new referral for social work support and assistance. CSW unable to reach patient successfully. CSW left a HIPPA compliant voice message encouraging patient to return call once available.  Plan-CSW will await return call or complete an additional outreach if needed.  Eula Fried, BSW, MSW, [email or web address] Phone: [phone] Fax: [phone]

## 2018-10-11 NOTE — Telephone Encounter (Signed)
Seeing you tomorrow at 10:45

## 2018-10-12 ENCOUNTER — Encounter: Payer: Self-pay | Admitting: Internal Medicine

## 2018-10-12 ENCOUNTER — Ambulatory Visit: Payer: Medicare Other | Admitting: Internal Medicine

## 2018-10-12 ENCOUNTER — Other Ambulatory Visit: Payer: Medicare Other

## 2018-10-12 ENCOUNTER — Telehealth: Payer: Self-pay | Admitting: Neurology

## 2018-10-12 VITALS — BP 118/70 | Temp 97.8°F | Resp 14 | Ht 60.0 in | Wt 83.8 lb

## 2018-10-12 DIAGNOSIS — G2581 Restless legs syndrome: Secondary | ICD-10-CM | POA: Diagnosis not present

## 2018-10-12 DIAGNOSIS — R300 Dysuria: Secondary | ICD-10-CM | POA: Diagnosis not present

## 2018-10-12 DIAGNOSIS — G20 Parkinson's disease: Secondary | ICD-10-CM | POA: Diagnosis not present

## 2018-10-12 DIAGNOSIS — M8580 Other specified disorders of bone density and structure, unspecified site: Secondary | ICD-10-CM | POA: Diagnosis not present

## 2018-10-12 DIAGNOSIS — E785 Hyperlipidemia, unspecified: Secondary | ICD-10-CM | POA: Diagnosis not present

## 2018-10-12 DIAGNOSIS — Z9181 History of falling: Secondary | ICD-10-CM | POA: Diagnosis not present

## 2018-10-12 DIAGNOSIS — D510 Vitamin B12 deficiency anemia due to intrinsic factor deficiency: Secondary | ICD-10-CM | POA: Diagnosis not present

## 2018-10-12 DIAGNOSIS — Z8673 Personal history of transient ischemic attack (TIA), and cerebral infarction without residual deficits: Secondary | ICD-10-CM | POA: Diagnosis not present

## 2018-10-12 DIAGNOSIS — E039 Hypothyroidism, unspecified: Secondary | ICD-10-CM | POA: Diagnosis not present

## 2018-10-12 DIAGNOSIS — R21 Rash and other nonspecific skin eruption: Secondary | ICD-10-CM | POA: Diagnosis not present

## 2018-10-12 DIAGNOSIS — Z7982 Long term (current) use of aspirin: Secondary | ICD-10-CM | POA: Diagnosis not present

## 2018-10-12 DIAGNOSIS — M47812 Spondylosis without myelopathy or radiculopathy, cervical region: Secondary | ICD-10-CM | POA: Diagnosis not present

## 2018-10-12 LAB — POCT URINALYSIS DIPSTICK
BILIRUBIN UA: NEGATIVE
Blood, UA: NEGATIVE
Glucose, UA: NEGATIVE
KETONES UA: NEGATIVE
Leukocytes, UA: NEGATIVE
Nitrite, UA: NEGATIVE
PH UA: 6 (ref 5.0–8.0)
Protein, UA: POSITIVE — AB
Spec Grav, UA: 1.025 (ref 1.010–1.025)
Urobilinogen, UA: NEGATIVE E.U./dL — AB

## 2018-10-12 MED ORDER — TRIAMCINOLONE ACETONIDE 0.1 % EX CREA: 1.0000 "application " | TOPICAL_CREAM | Freq: Two times a day (BID) | CUTANEOUS | 0 refills | Status: DC

## 2018-10-12 NOTE — Patient Instructions (Addendum)
Try the topical steroid cream for the rash.  If the rash gets worse or does not get better call us.    We will call you with the results of the urine culture.

## 2018-10-12 NOTE — Telephone Encounter (Signed)
Patient called and is needing Verbal Orders For PT home health for 4 weeks. Please call [phone]. Thanks

## 2018-10-12 NOTE — Assessment & Plan Note (Signed)
The rash started yesterday It is nonspecific, but likely allergic ?  New hair product when she went to the salon a few days ago We will try a topical steroid for couple of days Ideally would like to avoid antihistamines because she does have severe RLS Would also like to avoid oral steroids Rash may improve on its own as well Call if no improvement or if rash worsens

## 2018-10-12 NOTE — Assessment & Plan Note (Signed)
She is experiencing increased urinary frequency, dark urine, malodorous urine and some dysuria Urine dip here not suggestive of UTI, but her last infection in April looks similar and she did well bacteria Since she has had symptoms over the past month and they seem mild we will wait for the culture before considering treatment If her symptoms worsen over the next couple of days she will call and we will initiate treatment prior to the culture results Advised her to increase fluids Can take Azo if needed

## 2018-10-12 NOTE — Telephone Encounter (Signed)
Called and gave verbal order for PT.

## 2018-10-13 ENCOUNTER — Ambulatory Visit: Payer: Self-pay

## 2018-10-13 ENCOUNTER — Telehealth: Payer: Self-pay | Admitting: Neurology

## 2018-10-13 ENCOUNTER — Other Ambulatory Visit: Payer: Self-pay | Admitting: Licensed Clinical Social Worker

## 2018-10-13 DIAGNOSIS — Z9181 History of falling: Secondary | ICD-10-CM | POA: Diagnosis not present

## 2018-10-13 DIAGNOSIS — Z7982 Long term (current) use of aspirin: Secondary | ICD-10-CM | POA: Diagnosis not present

## 2018-10-13 DIAGNOSIS — Z8673 Personal history of transient ischemic attack (TIA), and cerebral infarction without residual deficits: Secondary | ICD-10-CM | POA: Diagnosis not present

## 2018-10-13 DIAGNOSIS — E785 Hyperlipidemia, unspecified: Secondary | ICD-10-CM | POA: Diagnosis not present

## 2018-10-13 DIAGNOSIS — G2581 Restless legs syndrome: Secondary | ICD-10-CM | POA: Diagnosis not present

## 2018-10-13 DIAGNOSIS — D510 Vitamin B12 deficiency anemia due to intrinsic factor deficiency: Secondary | ICD-10-CM | POA: Diagnosis not present

## 2018-10-13 DIAGNOSIS — M8580 Other specified disorders of bone density and structure, unspecified site: Secondary | ICD-10-CM | POA: Diagnosis not present

## 2018-10-13 DIAGNOSIS — M47812 Spondylosis without myelopathy or radiculopathy, cervical region: Secondary | ICD-10-CM | POA: Diagnosis not present

## 2018-10-13 DIAGNOSIS — G20 Parkinson's disease: Secondary | ICD-10-CM | POA: Diagnosis not present

## 2018-10-13 DIAGNOSIS — E039 Hypothyroidism, unspecified: Secondary | ICD-10-CM | POA: Diagnosis not present

## 2018-10-13 LAB — URINE CULTURE
MICRO NUMBER: 91392288
SPECIMEN QUALITY:: ADEQUATE

## 2018-10-13 NOTE — Telephone Encounter (Signed)
Gave verbal order to [facility] OT to see patient once weekly for 5 weeks.

## 2018-10-13 NOTE — Patient Outreach (Signed)
[facility]) Care Management  10/13/2018  Elizabeth [name] October 13, 1931 [id number]  [facility] CSW completed second outreach call to patient on 10/13/18 after receiving the following referral: Please refer patient to social work. Patient has had some hallucinations and cognition problems. Patient also has low social support and family. Please see note. [facility] CSW was able to successfully reach patient on second outreach attempt. HIPPA verifications received. [facility] CSW introduced self, reason for call and of [facility] social work services. Patient reports that she does not need social work services but is appreciative of call. She reports that she is doing fine and managing her care appropriately with the help from [place], her two nephews and close friend. Patient reports that her aide through [place] comes 7 days per week for 3-4 hours. Patient reports that her aide provides her stable transportation to all of her medical appointments. She shares that her aide also get groceries for her and prepares her meals. Patient reports being very satisfied with her care form [facility]. Patient reports receiving [place] services through [facility] as well. [facility] CSW informed patient that her neurologist made this referral to [facility] because they were concerned about her lack of support, non compliance with medications, frequent falls, psychosis and cognition. Patient denies experiencing any hallucinations. Patient reports wishing to continue to stay at her home for as long as possible. Patient says "I'm okay. I'm doing just fine" several times throughout our conversation. Patient denies [facility] social work services at this time and states that she does not need any additional support. Patient was provided [facility] CSW's number in case she changes her mind. Patient expressed appreciation for concern. [facility] CSW will update [facility] RNCM and sign off at this time as patient has refused social work assistance.   Eula Fried, BSW,  MSW, [email or web address] Phone: [phone] Fax: [phone]

## 2018-10-14 ENCOUNTER — Other Ambulatory Visit: Payer: Self-pay | Admitting: *Deleted

## 2018-10-14 DIAGNOSIS — G20 Parkinson's disease: Secondary | ICD-10-CM | POA: Diagnosis not present

## 2018-10-14 DIAGNOSIS — M8580 Other specified disorders of bone density and structure, unspecified site: Secondary | ICD-10-CM | POA: Diagnosis not present

## 2018-10-14 DIAGNOSIS — Z8673 Personal history of transient ischemic attack (TIA), and cerebral infarction without residual deficits: Secondary | ICD-10-CM | POA: Diagnosis not present

## 2018-10-14 DIAGNOSIS — Z7982 Long term (current) use of aspirin: Secondary | ICD-10-CM | POA: Diagnosis not present

## 2018-10-14 DIAGNOSIS — D510 Vitamin B12 deficiency anemia due to intrinsic factor deficiency: Secondary | ICD-10-CM | POA: Diagnosis not present

## 2018-10-14 DIAGNOSIS — E039 Hypothyroidism, unspecified: Secondary | ICD-10-CM | POA: Diagnosis not present

## 2018-10-14 DIAGNOSIS — M47812 Spondylosis without myelopathy or radiculopathy, cervical region: Secondary | ICD-10-CM | POA: Diagnosis not present

## 2018-10-14 DIAGNOSIS — G2581 Restless legs syndrome: Secondary | ICD-10-CM | POA: Diagnosis not present

## 2018-10-14 DIAGNOSIS — E785 Hyperlipidemia, unspecified: Secondary | ICD-10-CM | POA: Diagnosis not present

## 2018-10-14 DIAGNOSIS — Z9181 History of falling: Secondary | ICD-10-CM | POA: Diagnosis not present

## 2018-10-14 NOTE — Patient Outreach (Signed)
[place] [facility]) Care Management  10/14/2018  Elizabeth [name] 12-28-1930 [id number]   Telephone Assessment  RN initial outreach attempt unsuccessful and unable to leave a HIPAA approved voice message. Will scheduled another outreach call over the next week.  Raina Mina, RN Care Management Coordinator [place] Office [phone]

## 2018-10-18 ENCOUNTER — Telehealth: Payer: Self-pay | Admitting: Neurology

## 2018-10-18 DIAGNOSIS — M47812 Spondylosis without myelopathy or radiculopathy, cervical region: Secondary | ICD-10-CM | POA: Diagnosis not present

## 2018-10-18 DIAGNOSIS — E785 Hyperlipidemia, unspecified: Secondary | ICD-10-CM | POA: Diagnosis not present

## 2018-10-18 DIAGNOSIS — Z8673 Personal history of transient ischemic attack (TIA), and cerebral infarction without residual deficits: Secondary | ICD-10-CM | POA: Diagnosis not present

## 2018-10-18 DIAGNOSIS — Z9181 History of falling: Secondary | ICD-10-CM | POA: Diagnosis not present

## 2018-10-18 DIAGNOSIS — G2581 Restless legs syndrome: Secondary | ICD-10-CM | POA: Diagnosis not present

## 2018-10-18 DIAGNOSIS — G20 Parkinson's disease: Secondary | ICD-10-CM | POA: Diagnosis not present

## 2018-10-18 DIAGNOSIS — Z7982 Long term (current) use of aspirin: Secondary | ICD-10-CM | POA: Diagnosis not present

## 2018-10-18 DIAGNOSIS — D510 Vitamin B12 deficiency anemia due to intrinsic factor deficiency: Secondary | ICD-10-CM | POA: Diagnosis not present

## 2018-10-18 DIAGNOSIS — M8580 Other specified disorders of bone density and structure, unspecified site: Secondary | ICD-10-CM | POA: Diagnosis not present

## 2018-10-18 DIAGNOSIS — E039 Hypothyroidism, unspecified: Secondary | ICD-10-CM | POA: Diagnosis not present

## 2018-10-18 NOTE — Telephone Encounter (Signed)
Spoke with PT with [facility]. [phone]) She wanted to know if patient would be off Pramipexole. I did let her know that we did stop that medication. Patient was made aware multiple times and an extra dose of Rytary was added to bedtime.

## 2018-10-19 ENCOUNTER — Encounter: Payer: Self-pay | Admitting: Internal Medicine

## 2018-10-19 ENCOUNTER — Ambulatory Visit (INDEPENDENT_AMBULATORY_CARE_PROVIDER_SITE_OTHER)
Admission: RE | Admit: 2018-10-19 | Discharge: 2018-10-19 | Disposition: A | Discharge status: Home / Self Care | Payer: Medicare Other | Source: Ambulatory Visit | Attending: Internal Medicine | Admitting: Internal Medicine

## 2018-10-19 ENCOUNTER — Other Ambulatory Visit: Payer: Self-pay | Admitting: Internal Medicine

## 2018-10-19 ENCOUNTER — Ambulatory Visit: Payer: Medicare Other | Admitting: Internal Medicine

## 2018-10-19 ENCOUNTER — Other Ambulatory Visit: Payer: Self-pay | Admitting: *Deleted

## 2018-10-19 VITALS — BP 120/68 | HR 97 | Temp 98.0°F | Ht 60.0 in | Wt 83.0 lb

## 2018-10-19 DIAGNOSIS — M545 Low back pain, unspecified: Secondary | ICD-10-CM

## 2018-10-19 DIAGNOSIS — S3992XA Unspecified injury of lower back, initial encounter: Secondary | ICD-10-CM | POA: Diagnosis not present

## 2018-10-19 DIAGNOSIS — S79911A Unspecified injury of right hip, initial encounter: Secondary | ICD-10-CM | POA: Diagnosis not present

## 2018-10-19 DIAGNOSIS — R1032 Left lower quadrant pain: Secondary | ICD-10-CM

## 2018-10-19 DIAGNOSIS — S79912A Unspecified injury of left hip, initial encounter: Secondary | ICD-10-CM | POA: Diagnosis not present

## 2018-10-19 DIAGNOSIS — W19XXXD Unspecified fall, subsequent encounter: Secondary | ICD-10-CM

## 2018-10-19 DIAGNOSIS — M810 Age-related osteoporosis without current pathological fracture: Secondary | ICD-10-CM

## 2018-10-19 DIAGNOSIS — S299XXA Unspecified injury of thorax, initial encounter: Secondary | ICD-10-CM | POA: Diagnosis not present

## 2018-10-19 MED ORDER — TRAMADOL HCL 50 MG PO TABS: 50.0000 mg | ORAL_TABLET | Freq: Four times a day (QID) | ORAL | 0 refills | Status: DC | PRN

## 2018-10-19 MED ORDER — TIZANIDINE HCL 2 MG PO TABS: 2.0000 mg | ORAL_TABLET | Freq: Four times a day (QID) | ORAL | 1 refills | Status: DC | PRN

## 2018-10-19 NOTE — Progress Notes (Signed)
Subjective:    Patient ID: Elizabeth [name], female    DOB: [birth year], 84 y.o.   MRN: [id number]  HPI  84yo with Parkinsons here to f/u with c/o persisent acute onset mod to severe back pain for 6 wks after at least one significant fall, maybe worse after a more proximal fall.  Has some generalized weakness and otherwise seemed to just get off balance.  Pain is worse to mid and lower back, right more then left side, but Pt denies bowel or bladder change, fever, recent wt loss,  worsening LE pain/numbness/weakness. Also has some left groin pain, but mild and seemed only in last couple of days  Almost fell again today but caught by the caretaker with her.  Pt denies chest pain, increased sob or doe, wheezing, orthopnea, PND, increased LE swelling, palpitations, dizziness or syncope.   Pt denies polydipsia, polyuria.  Pt denies new neurological symptoms such as new headache, or facial or extremity weakness or numbness Past Medical History:  Diagnosis Date  . Abnormal Pap smear of vagina 09/2006   Dr Nori Riis  . Abnormality of gait 05/24/2015   PMH R occipital infarct B12 deficiency H/o polio affected left leg  In Physical Therapy to improve balance & strength through Noel Gerold  . Arthritis 12/25/2015  . Cervicalgia 01/17/2009   MRI 03/27/16:  IMPRESSION: 1. At C3-4 there is a mild broad-based disc bulge. Moderate bilateral facet arthropathy and uncovertebral degenerative changes resulting in moderate bilateral foraminal stenosis. 2. At C5-6 there is a broad-based disc osteophyte complex. Bilateral uncovertebral degenerative changes and facet arthropathy resulting in moderate bilateral foraminal stenosis.  . Fall 09/11/2017  . History of CVA (cerebrovascular accident) 07/20/2012   Chronic R occipital infarct ; stable on CT 05/21/2012 .Dr Jannifer Franklin , Neurology   . History of TIA (transient ischemic attack) 01/26/2008  . HYPERLIPIDEMIA 01/26/2008   NMR Lipoprofile 2009: LDL 135 (1704/911), HDL 62, TG 131. LDL goal = <  100; ideally < 70. MGF MI in 13s No FH CVA  . Hypothyroidism   . Intention tremor 12/08/2013   Onset after fall 04/2012   . Irritable bowel syndrome (IBS) 01/17/2009   Dr. Unice Cobble  . Osteopenia    PMH fracture heel, wrist  . Parkinson disease ([place]) 06/25/2016   Dr Tat  . Pernicious anemia   . Post-polio syndrome    minor problems L leg  . Restless leg syndrome 12/25/2015   Dr Tat  . Subdural hygroma 09/11/2017  . Swallowing disorder 03/04/2017  . Urinary incontinence 09/07/2008   Dr McDiarmid, Urology   . Vitamin B12 deficiency   . Weakness 06/24/2017   Past Surgical History:  Procedure Laterality Date  . BLADDER SUSPENSION  2000  . CATARACT EXTRACTION, BILATERAL     Dr. Gershon Crane  . no colonoscopy     "I never felt I needed one "  . YAG LASER APPLICATION Left    Dr. Gershon Crane    reports that she has never smoked. She has never used smokeless tobacco. She reports that she drinks about 1.0 standard drinks of alcohol per week. She reports that she does not use drugs. family history includes Alzheimer's disease in her father; Breast cancer in her maternal grandmother; Cancer in her maternal grandmother; Emphysema in her mother; Heart attack in her maternal grandfather; Hypertension in her mother. Allergies  Allergen Reactions  . Atorvastatin Other (See Comments)    REACTION: Felt funny (only way patient could describe)  . Crestor [Rosuvastatin Calcium]  Other (See Comments)    Leg cramps, muscle aches  . Vesicare [Solifenacin]     constipation   Current Outpatient Medications on File Prior to Visit  Medication Sig Dispense Refill  . aspirin 81 MG tablet Take 81 mg by mouth daily.      . Calcium Carbonate-Vitamin D (CALCIUM + D PO) Take 1 tablet by mouth daily. 600-400    . Carbidopa-Levodopa ER (RYTARY) 36.25-145 MG CPCR Take 1 tablet by mouth 4 (four) times daily. 120 capsule 5  . levothyroxine (SYNTHROID, LEVOTHROID) 125 MCG tablet Take 1 tablet (125 mcg total) by mouth  daily. 90 tablet 1  . pramipexole (MIRAPEX) 0.125 MG tablet TAKE ONE TABLET AT BEDTIME. 30 tablet 5  . Simethicone (GAS-X EXTRA STRENGTH) 125 MG CAPS Take 125 mg by mouth daily as needed (gas).    . triamcinolone cream (KENALOG) 0.1 % Apply 1 application topically 2 (two) times daily. 30 g 0  . vitamin B-12 (CYANOCOBALAMIN) 1000 MCG tablet Take 1 tablet (1,000 mcg total) by mouth daily. 90 tablet 3   No current facility-administered medications on file prior to visit.    Review of Systems  Constitutional: Negative for other unusual diaphoresis or sweats HENT: Negative for ear discharge or swelling Eyes: Negative for other worsening visual disturbances Respiratory: Negative for stridor or other swelling  Gastrointestinal: Negative for worsening distension or other blood Genitourinary: Negative for retention or other urinary change Musculoskeletal: Negative for other MSK pain or swelling Skin: Negative for color change or other new lesions Neurological: Negative for worsening tremors and other numbness  Psychiatric/Behavioral: Negative for worsening agitation or other fatigue All other system neg per pt    Objective:   Physical Exam BP 120/68   Pulse 97   Temp 98 F (36.7 C) (Oral)   Ht 5' (1.524 m)   Wt 83 lb (37.6 kg)   SpO2 94%   BMI 16.21 kg/m  VS noted, in pain ,m non toxic Constitutional: Pt appears in NAD HENT: Head: NCAT.  Right Ear: External ear normal.  Left Ear: External ear normal.  Eyes: . Pupils are equal, round, and reactive to light. Conjunctivae and EOM are normal Nose: without d/c or deformity Neck: Neck supple. Gross normal ROM Cardiovascular: Normal rate and regular rhythm.   Pulmonary/Chest: Effort normal and breath sounds without rales or wheezing.  Abd:  Soft, NT, ND, + BS, no organomegaly Spine: nontender in the midline to palpation without swelling or rash, and no signficant paravertebral spasm Neurological: Pt is alert. At baseline orientation,  motor grossly intact Skin: Skin is warm. No rashes, other new lesions, no LE edema Psychiatric: Pt behavior is normal without agitation  No other exam findings Lab Results  Component Value Date   WBC 4.4 05/28/2018   HGB 11.3 (A) 05/28/2018   HCT 33 (A) 05/28/2018   PLT 252 05/28/2018   GLUCOSE 99 05/23/2018   CHOL 145 08/06/2016   TRIG 146.0 08/06/2016   HDL 55.00 08/06/2016   LDLDIRECT 120.8 06/10/2010   LDLCALC 61 08/06/2016   ALT 2 04/28/2018   AST 23 04/28/2018   NA 139 05/28/2018   K 4.2 05/28/2018   CL 109 05/23/2018   CREATININE 0.6 05/28/2018   BUN 13 05/28/2018   CO2 26 05/23/2018   TSH 0.316 (L) 05/23/2018   HGBA1C 5.8 12/14/2009       Assessment & Plan:

## 2018-10-19 NOTE — Patient Outreach (Signed)
[facility]) Care Management  10/19/2018  Elizabeth [name] [place] [birth year] [id number]    RN spoke with pt today and verified identifiers. RN explained the purpose for today's call and inquired on pt's possible needs. Pt states she is doing well and currently does not need any assistance via [facility] at this time. RN inquired on any needs related to pharmacy or a Education officer, museum however pt indicates no needs. Based upon the pt's response alerted pt that her provider will be notified that pt does not wish to participate with the [facility] program at this time with no needs. Case will be closed.  Raina Mina, RN Care Management Coordinator [place] Office [phone]

## 2018-10-19 NOTE — Patient Instructions (Signed)
Please take all new medication as prescribed - the pain medication and muscle relaxer as needed  Please continue all other medications as before  Please have the pharmacy call with any other refills you may need.  Please keep your appointments with your specialists as you may have planned  Please go to the XRAY Department in the Basement (go straight as you get off the elevator) for the x-ray testing  You will be contacted by phone if any changes need to be made immediately.  Otherwise, you will receive a letter about your results with an explanation, but please check with MyChart first.  Please remember to sign up for MyChart if you have not done so, as this will be important to you in the future with finding out test results, communicating by private email, and scheduling acute appointments online when needed.

## 2018-10-20 ENCOUNTER — Other Ambulatory Visit: Payer: Self-pay | Admitting: Internal Medicine

## 2018-10-20 ENCOUNTER — Encounter: Payer: Self-pay | Admitting: Internal Medicine

## 2018-10-20 ENCOUNTER — Ambulatory Visit: Payer: Self-pay

## 2018-10-20 ENCOUNTER — Telehealth: Payer: Self-pay

## 2018-10-20 DIAGNOSIS — S22009A Unspecified fracture of unspecified thoracic vertebra, initial encounter for closed fracture: Secondary | ICD-10-CM

## 2018-10-20 DIAGNOSIS — Z9181 History of falling: Secondary | ICD-10-CM | POA: Diagnosis not present

## 2018-10-20 DIAGNOSIS — Z8673 Personal history of transient ischemic attack (TIA), and cerebral infarction without residual deficits: Secondary | ICD-10-CM | POA: Diagnosis not present

## 2018-10-20 DIAGNOSIS — G2581 Restless legs syndrome: Secondary | ICD-10-CM | POA: Diagnosis not present

## 2018-10-20 DIAGNOSIS — E785 Hyperlipidemia, unspecified: Secondary | ICD-10-CM | POA: Diagnosis not present

## 2018-10-20 DIAGNOSIS — G20 Parkinson's disease: Secondary | ICD-10-CM | POA: Diagnosis not present

## 2018-10-20 DIAGNOSIS — S32000A Wedge compression fracture of unspecified lumbar vertebra, initial encounter for closed fracture: Secondary | ICD-10-CM

## 2018-10-20 DIAGNOSIS — D510 Vitamin B12 deficiency anemia due to intrinsic factor deficiency: Secondary | ICD-10-CM | POA: Diagnosis not present

## 2018-10-20 DIAGNOSIS — E039 Hypothyroidism, unspecified: Secondary | ICD-10-CM | POA: Diagnosis not present

## 2018-10-20 DIAGNOSIS — M47812 Spondylosis without myelopathy or radiculopathy, cervical region: Secondary | ICD-10-CM | POA: Diagnosis not present

## 2018-10-20 DIAGNOSIS — M8580 Other specified disorders of bone density and structure, unspecified site: Secondary | ICD-10-CM | POA: Diagnosis not present

## 2018-10-20 DIAGNOSIS — Z7982 Long term (current) use of aspirin: Secondary | ICD-10-CM | POA: Diagnosis not present

## 2018-10-20 NOTE — Telephone Encounter (Signed)
Called pt, LVM.   CRM created.  

## 2018-10-20 NOTE — Telephone Encounter (Signed)
-----   Message from Biagio Borg, MD sent at 10/20/2018  1:04 PM EST ----- Letter sent, cont same tx except  The test results show that your current treatment is OK, except the xrays did show possible new compression fractures to the thoracic (middle spine) and lumbar (lower spine) areas. As you may not be able to tolerate an MRI, I will refer you to Neurosurgury for further evaluation and possible treatment to help the pain.    Elizabeth [name] to please inform pt (or the caretaker who helped her yesterday), I will do referral

## 2018-10-20 NOTE — Telephone Encounter (Signed)
Incoming call from Patient from Patient requesting results from xray.. Provided results from 10/20/18.Marland Kitchen Patient voiced understanding. Inquires what treatment would be involved with new compression fractures to the thoracic and lumbar spine area? Patient request a phone call relating to plan

## 2018-10-21 NOTE — Telephone Encounter (Signed)
A referral was ordered by Dr Jenny Reichmann for Neurosurgery - they will discuss a procedure to help with pain.  Until then pain medication is the only treatment

## 2018-10-22 ENCOUNTER — Telehealth: Payer: Self-pay | Admitting: Internal Medicine

## 2018-10-22 NOTE — Telephone Encounter (Signed)
11/29 8:52am KO Pt called for xray results - please advise [phone]  Copied from [place] [id number]. Topic: General - Other >> Oct 20, 2018  1:09 PM Juliet Rude, CMA wrote: Reason for CRM:   Discuss  XRAY results. Ok for [facility] to inform.

## 2018-10-22 NOTE — Assessment & Plan Note (Signed)
Less likely to be fracture, but for pelvic films

## 2018-10-22 NOTE — Assessment & Plan Note (Signed)
Last dxa 2014 on record with osteopenia, if has fx would need to consider repeat DXA

## 2018-10-22 NOTE — Assessment & Plan Note (Signed)
Mechanical, etiology unclear, declines labs today

## 2018-10-22 NOTE — Assessment & Plan Note (Signed)
With mid and lower back marked pain, suspect occult compression fx's, for tramadol prn, tizanidine 2 mg prn, and lumbar/thoracic films

## 2018-10-22 NOTE — Telephone Encounter (Signed)
Results given and documented in the result note

## 2018-10-25 DIAGNOSIS — G20 Parkinson's disease: Secondary | ICD-10-CM | POA: Diagnosis not present

## 2018-10-25 DIAGNOSIS — Z7982 Long term (current) use of aspirin: Secondary | ICD-10-CM | POA: Diagnosis not present

## 2018-10-25 DIAGNOSIS — D510 Vitamin B12 deficiency anemia due to intrinsic factor deficiency: Secondary | ICD-10-CM | POA: Diagnosis not present

## 2018-10-25 DIAGNOSIS — E039 Hypothyroidism, unspecified: Secondary | ICD-10-CM | POA: Diagnosis not present

## 2018-10-25 DIAGNOSIS — M8580 Other specified disorders of bone density and structure, unspecified site: Secondary | ICD-10-CM | POA: Diagnosis not present

## 2018-10-25 DIAGNOSIS — G2581 Restless legs syndrome: Secondary | ICD-10-CM | POA: Diagnosis not present

## 2018-10-25 DIAGNOSIS — M47812 Spondylosis without myelopathy or radiculopathy, cervical region: Secondary | ICD-10-CM | POA: Diagnosis not present

## 2018-10-25 DIAGNOSIS — Z8673 Personal history of transient ischemic attack (TIA), and cerebral infarction without residual deficits: Secondary | ICD-10-CM | POA: Diagnosis not present

## 2018-10-25 DIAGNOSIS — E785 Hyperlipidemia, unspecified: Secondary | ICD-10-CM | POA: Diagnosis not present

## 2018-10-25 DIAGNOSIS — Z9181 History of falling: Secondary | ICD-10-CM | POA: Diagnosis not present

## 2018-10-25 NOTE — Telephone Encounter (Signed)
Pt aware of response.  

## 2018-10-27 DIAGNOSIS — R350 Frequency of micturition: Secondary | ICD-10-CM | POA: Diagnosis not present

## 2018-10-27 NOTE — Progress Notes (Signed)
PA initiated via [email or web address] for pt's  Rytary 36.25-145MG  er capsules

## 2018-10-28 NOTE — Progress Notes (Signed)
PA outcome: NA  With the following message    REWORK FOR 2020 [place]

## 2018-10-28 NOTE — Progress Notes (Signed)
Forwarding to [place], [place] as [employer]

## 2018-10-29 NOTE — Progress Notes (Signed)
Received note from [facility] that patient approved for Rytary 145 mg valid through 11/24/2019.

## 2018-10-29 NOTE — Progress Notes (Signed)
I'm unsure of what this means. Will follow through if they need anything further from me.

## 2018-11-01 DIAGNOSIS — G2581 Restless legs syndrome: Secondary | ICD-10-CM | POA: Diagnosis not present

## 2018-11-01 DIAGNOSIS — Z8673 Personal history of transient ischemic attack (TIA), and cerebral infarction without residual deficits: Secondary | ICD-10-CM | POA: Diagnosis not present

## 2018-11-01 DIAGNOSIS — M8580 Other specified disorders of bone density and structure, unspecified site: Secondary | ICD-10-CM | POA: Diagnosis not present

## 2018-11-01 DIAGNOSIS — Z9181 History of falling: Secondary | ICD-10-CM | POA: Diagnosis not present

## 2018-11-01 DIAGNOSIS — D510 Vitamin B12 deficiency anemia due to intrinsic factor deficiency: Secondary | ICD-10-CM | POA: Diagnosis not present

## 2018-11-01 DIAGNOSIS — Z7982 Long term (current) use of aspirin: Secondary | ICD-10-CM | POA: Diagnosis not present

## 2018-11-01 DIAGNOSIS — E785 Hyperlipidemia, unspecified: Secondary | ICD-10-CM | POA: Diagnosis not present

## 2018-11-01 DIAGNOSIS — M47812 Spondylosis without myelopathy or radiculopathy, cervical region: Secondary | ICD-10-CM | POA: Diagnosis not present

## 2018-11-01 DIAGNOSIS — E039 Hypothyroidism, unspecified: Secondary | ICD-10-CM | POA: Diagnosis not present

## 2018-11-01 DIAGNOSIS — G20 Parkinson's disease: Secondary | ICD-10-CM | POA: Diagnosis not present
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
## 2018-11-02 ENCOUNTER — Ambulatory Visit: Payer: Medicare Other | Admitting: Internal Medicine

## 2018-11-02 ENCOUNTER — Encounter: Payer: Self-pay | Admitting: Internal Medicine

## 2018-11-02 ENCOUNTER — Other Ambulatory Visit (INDEPENDENT_AMBULATORY_CARE_PROVIDER_SITE_OTHER): Payer: Medicare Other

## 2018-11-02 VITALS — BP 172/80 | HR 71 | Temp 97.8°F | Resp 16 | Ht 60.0 in | Wt 87.0 lb

## 2018-11-02 DIAGNOSIS — R600 Localized edema: Secondary | ICD-10-CM

## 2018-11-02 DIAGNOSIS — M79671 Pain in right foot: Secondary | ICD-10-CM | POA: Diagnosis not present

## 2018-11-02 DIAGNOSIS — E034 Atrophy of thyroid (acquired): Secondary | ICD-10-CM

## 2018-11-02 DIAGNOSIS — M810 Age-related osteoporosis without current pathological fracture: Secondary | ICD-10-CM

## 2018-11-02 DIAGNOSIS — M25511 Pain in right shoulder: Secondary | ICD-10-CM | POA: Insufficient documentation

## 2018-11-02 DIAGNOSIS — D649 Anemia, unspecified: Secondary | ICD-10-CM | POA: Diagnosis not present

## 2018-11-02 DIAGNOSIS — S32030A Wedge compression fracture of third lumbar vertebra, initial encounter for closed fracture: Secondary | ICD-10-CM | POA: Insufficient documentation

## 2018-11-02 DIAGNOSIS — R1310 Dysphagia, unspecified: Secondary | ICD-10-CM

## 2018-11-02 DIAGNOSIS — M79672 Pain in left foot: Secondary | ICD-10-CM

## 2018-11-02 DIAGNOSIS — S32030D Wedge compression fracture of third lumbar vertebra, subsequent encounter for fracture with routine healing: Secondary | ICD-10-CM

## 2018-11-02 DIAGNOSIS — N3281 Overactive bladder: Secondary | ICD-10-CM

## 2018-11-02 LAB — CBC WITH DIFFERENTIAL/PLATELET
BASOS PCT: 0.6 % (ref 0.0–3.0)
Basophils Absolute: 0 10*3/uL (ref 0.0–0.1)
EOS ABS: 0.3 10*3/uL (ref 0.0–0.7)
EOS PCT: 6.6 % — AB (ref 0.0–5.0)
HEMATOCRIT: 31.2 % — AB (ref 36.0–46.0)
HEMOGLOBIN: 10.5 g/dL — AB (ref 12.0–15.0)
LYMPHS PCT: 19 % (ref 12.0–46.0)
Lymphs Abs: 0.9 10*3/uL (ref 0.7–4.0)
MCHC: 33.6 g/dL (ref 30.0–36.0)
MCV: 83.3 fl (ref 78.0–100.0)
Monocytes Absolute: 0.5 10*3/uL (ref 0.1–1.0)
Monocytes Relative: 11.3 % (ref 3.0–12.0)
NEUTROS ABS: 3.1 10*3/uL (ref 1.4–7.7)
Neutrophils Relative %: 62.5 % (ref 43.0–77.0)
PLATELETS: 283 10*3/uL (ref 150.0–400.0)
RBC: 3.74 Mil/uL — ABNORMAL LOW (ref 3.87–5.11)
RDW: 16.6 % — AB (ref 11.5–15.5)
WBC: 4.9 10*3/uL (ref 4.0–10.5)

## 2018-11-02 LAB — COMPREHENSIVE METABOLIC PANEL
ALBUMIN: 3.6 g/dL (ref 3.5–5.2)
ALK PHOS: 127 U/L — AB (ref 39–117)
ALT: 15 U/L (ref 0–35)
AST: 28 U/L (ref 0–37)
BILIRUBIN TOTAL: 0.8 mg/dL (ref 0.2–1.2)
BUN: 18 mg/dL (ref 6–23)
CALCIUM: 8.7 mg/dL (ref 8.4–10.5)
CO2: 28 mEq/L (ref 19–32)
Chloride: 105 mEq/L (ref 96–112)
Creatinine, Ser: 0.66 mg/dL (ref 0.40–1.20)
GFR: 90.31 mL/min (ref >60.00)
GLUCOSE: 93 mg/dL (ref 70–99)
POTASSIUM: 3.8 meq/L (ref 3.5–5.1)
Sodium: 140 mEq/L (ref 135–145)
TOTAL PROTEIN: 6.4 g/dL (ref 6.0–8.3)

## 2018-11-02 LAB — VITAMIN D 25 HYDROXY (VIT D DEFICIENCY, FRACTURES): VITD: 36.25 ng/mL (ref 30.00–100.00)

## 2018-11-02 LAB — TSH: TSH: 0.03 u[IU]/mL — AB (ref 0.35–4.50)

## 2018-11-02 NOTE — Assessment & Plan Note (Signed)
Recheck CBC. 

## 2018-11-02 NOTE — Assessment & Plan Note (Signed)
L3 compression fracture, midthoracic compression fracture-secondary to a fall in November 2019 Referred to neurosurgery for further evaluation Currently she is tolerating the pain so likely she does not need any intervention, but still think she should see neurosurgery We will obtain DEXA scan from earlier this year-discussed possible treatment for her bones, because I think she would benefit from them Continue calcium and vitamin D Check vitamin D level

## 2018-11-02 NOTE — Assessment & Plan Note (Signed)
Following with urology

## 2018-11-02 NOTE — Assessment & Plan Note (Signed)
Will check TSH and titrate medication as needed

## 2018-11-02 NOTE — Assessment & Plan Note (Addendum)
History of osteopenia-had a bone density earlier this year at her gynecologist office-we will obtain the report Given her recent compression fractures this would not qualify her for treatment and pressure into an osteoporosis diagnosis Discussed treatment options including Fosamax, Reclast and Prolia-she will consider these Continue calcium and vitamin D Fall precautions-she is currently doing physical therapy prescribed by neurology Check vitamin D level
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
## 2018-11-02 NOTE — Assessment & Plan Note (Signed)
Overall controlled-she still has edema, but is not causing any symptoms No longer on furosemide Continue to monitor off medication

## 2018-11-02 NOTE — Assessment & Plan Note (Signed)
Has difficulty swallowing, which is chronic but recently it has gotten worse Possibly multifactorial-she does have severe thoracic and cervical kyphosis which is likely contributing Parkinson's disease may also be contributing Will refer to GI for further evaluation

## 2018-11-02 NOTE — Patient Instructions (Signed)
  Tests ordered today. Your results will be released to [place] (or called to you) after review, usually within 72hours after test completion. If any changes need to be made, you will be notified at that same time.   Medications reviewed and updated.  Changes include :   none   A referral was ordered for GI to evaluate your swallowing  Please followup in 6 months

## 2018-11-03 ENCOUNTER — Telehealth: Payer: Self-pay | Admitting: Neurology

## 2018-11-03 ENCOUNTER — Encounter: Payer: Self-pay | Admitting: Gastroenterology

## 2018-11-03 DIAGNOSIS — G2581 Restless legs syndrome: Secondary | ICD-10-CM | POA: Diagnosis not present

## 2018-11-03 DIAGNOSIS — Z7982 Long term (current) use of aspirin: Secondary | ICD-10-CM | POA: Diagnosis not present

## 2018-11-03 DIAGNOSIS — M8580 Other specified disorders of bone density and structure, unspecified site: Secondary | ICD-10-CM | POA: Diagnosis not present

## 2018-11-03 DIAGNOSIS — E039 Hypothyroidism, unspecified: Secondary | ICD-10-CM | POA: Diagnosis not present

## 2018-11-03 DIAGNOSIS — Z9181 History of falling: Secondary | ICD-10-CM | POA: Diagnosis not present

## 2018-11-03 DIAGNOSIS — E785 Hyperlipidemia, unspecified: Secondary | ICD-10-CM | POA: Diagnosis not present

## 2018-11-03 DIAGNOSIS — M47812 Spondylosis without myelopathy or radiculopathy, cervical region: Secondary | ICD-10-CM | POA: Diagnosis not present

## 2018-11-03 DIAGNOSIS — G20 Parkinson's disease: Secondary | ICD-10-CM | POA: Diagnosis not present

## 2018-11-03 DIAGNOSIS — Z8673 Personal history of transient ischemic attack (TIA), and cerebral infarction without residual deficits: Secondary | ICD-10-CM | POA: Diagnosis not present

## 2018-11-03 DIAGNOSIS — D510 Vitamin B12 deficiency anemia due to intrinsic factor deficiency: Secondary | ICD-10-CM | POA: Diagnosis not present

## 2018-11-03 NOTE — Telephone Encounter (Signed)
Flor (PT) with [facility] called and is needing Verbal orders. Please Call. Thanks

## 2018-11-04 MED ORDER — LEVOTHYROXINE SODIUM 112 MCG PO TABS: 112.0000 ug | ORAL_TABLET | Freq: Every day | ORAL | 0 refills | Status: DC

## 2018-11-04 NOTE — Telephone Encounter (Signed)
Called back and gave verbal orders for therapy.

## 2018-11-05 DIAGNOSIS — G2581 Restless legs syndrome: Secondary | ICD-10-CM | POA: Diagnosis not present

## 2018-11-05 DIAGNOSIS — D510 Vitamin B12 deficiency anemia due to intrinsic factor deficiency: Secondary | ICD-10-CM | POA: Diagnosis not present

## 2018-11-05 DIAGNOSIS — Z8673 Personal history of transient ischemic attack (TIA), and cerebral infarction without residual deficits: Secondary | ICD-10-CM | POA: Diagnosis not present

## 2018-11-05 DIAGNOSIS — G20 Parkinson's disease: Secondary | ICD-10-CM | POA: Diagnosis not present

## 2018-11-05 DIAGNOSIS — M47812 Spondylosis without myelopathy or radiculopathy, cervical region: Secondary | ICD-10-CM | POA: Diagnosis not present

## 2018-11-05 DIAGNOSIS — E785 Hyperlipidemia, unspecified: Secondary | ICD-10-CM | POA: Diagnosis not present

## 2018-11-05 DIAGNOSIS — M8580 Other specified disorders of bone density and structure, unspecified site: Secondary | ICD-10-CM | POA: Diagnosis not present

## 2018-11-05 DIAGNOSIS — Z9181 History of falling: Secondary | ICD-10-CM | POA: Diagnosis not present

## 2018-11-05 DIAGNOSIS — Z7982 Long term (current) use of aspirin: Secondary | ICD-10-CM | POA: Diagnosis not present

## 2018-11-05 DIAGNOSIS — E039 Hypothyroidism, unspecified: Secondary | ICD-10-CM | POA: Diagnosis not present

## 2018-11-08 DIAGNOSIS — G2581 Restless legs syndrome: Secondary | ICD-10-CM | POA: Diagnosis not present

## 2018-11-08 DIAGNOSIS — E785 Hyperlipidemia, unspecified: Secondary | ICD-10-CM | POA: Diagnosis not present

## 2018-11-08 DIAGNOSIS — Z8673 Personal history of transient ischemic attack (TIA), and cerebral infarction without residual deficits: Secondary | ICD-10-CM | POA: Diagnosis not present

## 2018-11-08 DIAGNOSIS — D510 Vitamin B12 deficiency anemia due to intrinsic factor deficiency: Secondary | ICD-10-CM | POA: Diagnosis not present

## 2018-11-08 DIAGNOSIS — E039 Hypothyroidism, unspecified: Secondary | ICD-10-CM | POA: Diagnosis not present

## 2018-11-08 DIAGNOSIS — M8580 Other specified disorders of bone density and structure, unspecified site: Secondary | ICD-10-CM | POA: Diagnosis not present

## 2018-11-08 DIAGNOSIS — Z7982 Long term (current) use of aspirin: Secondary | ICD-10-CM | POA: Diagnosis not present

## 2018-11-08 DIAGNOSIS — G20 Parkinson's disease: Secondary | ICD-10-CM | POA: Diagnosis not present

## 2018-11-08 DIAGNOSIS — Z9181 History of falling: Secondary | ICD-10-CM | POA: Diagnosis not present

## 2018-11-08 DIAGNOSIS — M47812 Spondylosis without myelopathy or radiculopathy, cervical region: Secondary | ICD-10-CM | POA: Diagnosis not present

## 2018-11-10 ENCOUNTER — Other Ambulatory Visit: Payer: Self-pay | Admitting: Internal Medicine

## 2018-11-10 ENCOUNTER — Telehealth: Payer: Self-pay

## 2018-11-10 DIAGNOSIS — Z8673 Personal history of transient ischemic attack (TIA), and cerebral infarction without residual deficits: Secondary | ICD-10-CM | POA: Diagnosis not present

## 2018-11-10 DIAGNOSIS — M8580 Other specified disorders of bone density and structure, unspecified site: Secondary | ICD-10-CM | POA: Diagnosis not present

## 2018-11-10 DIAGNOSIS — G2581 Restless legs syndrome: Secondary | ICD-10-CM | POA: Diagnosis not present

## 2018-11-10 DIAGNOSIS — E039 Hypothyroidism, unspecified: Secondary | ICD-10-CM | POA: Diagnosis not present

## 2018-11-10 DIAGNOSIS — D510 Vitamin B12 deficiency anemia due to intrinsic factor deficiency: Secondary | ICD-10-CM | POA: Diagnosis not present

## 2018-11-10 DIAGNOSIS — G20 Parkinson's disease: Secondary | ICD-10-CM | POA: Diagnosis not present

## 2018-11-10 DIAGNOSIS — M47812 Spondylosis without myelopathy or radiculopathy, cervical region: Secondary | ICD-10-CM | POA: Diagnosis not present

## 2018-11-10 DIAGNOSIS — Z7982 Long term (current) use of aspirin: Secondary | ICD-10-CM | POA: Diagnosis not present

## 2018-11-10 DIAGNOSIS — E785 Hyperlipidemia, unspecified: Secondary | ICD-10-CM | POA: Diagnosis not present

## 2018-11-10 DIAGNOSIS — Z9181 History of falling: Secondary | ICD-10-CM | POA: Diagnosis not present

## 2018-11-10 MED ORDER — LEVOTHYROXINE SODIUM 88 MCG PO TABS
88.0000 ug | ORAL_TABLET | Freq: Every day | ORAL | 3 refills | Status: DC
Start: 2018-11-10 — End: 2019-01-10

## 2018-11-10 NOTE — Telephone Encounter (Signed)
Pt aware.

## 2018-11-10 NOTE — Telephone Encounter (Signed)
Decrease to 88 mcg - sent to pof

## 2018-11-10 NOTE — Telephone Encounter (Signed)
I received a fax from [facility] stating that pt states she is taking 100 mcg of levothyroxine and she did not know why her medication was increased to 112 mcg. I called pt to clarify what she is taking. She states she has been taking 100 mcg of levothyroxine daily instead of 125 mcg daily. Her medication needs to be decreased since her TSH was increased. Please advise on new medication dosage for thyroid.

## 2018-11-15 DIAGNOSIS — Z8673 Personal history of transient ischemic attack (TIA), and cerebral infarction without residual deficits: Secondary | ICD-10-CM | POA: Diagnosis not present

## 2018-11-15 DIAGNOSIS — D510 Vitamin B12 deficiency anemia due to intrinsic factor deficiency: Secondary | ICD-10-CM | POA: Diagnosis not present

## 2018-11-15 DIAGNOSIS — E785 Hyperlipidemia, unspecified: Secondary | ICD-10-CM | POA: Diagnosis not present

## 2018-11-15 DIAGNOSIS — M8580 Other specified disorders of bone density and structure, unspecified site: Secondary | ICD-10-CM | POA: Diagnosis not present

## 2018-11-15 DIAGNOSIS — Z7982 Long term (current) use of aspirin: Secondary | ICD-10-CM | POA: Diagnosis not present

## 2018-11-15 DIAGNOSIS — Z9181 History of falling: Secondary | ICD-10-CM | POA: Diagnosis not present

## 2018-11-15 DIAGNOSIS — G2581 Restless legs syndrome: Secondary | ICD-10-CM | POA: Diagnosis not present

## 2018-11-15 DIAGNOSIS — E039 Hypothyroidism, unspecified: Secondary | ICD-10-CM | POA: Diagnosis not present

## 2018-11-15 DIAGNOSIS — G20 Parkinson's disease: Secondary | ICD-10-CM | POA: Diagnosis not present

## 2018-11-15 DIAGNOSIS — M47812 Spondylosis without myelopathy or radiculopathy, cervical region: Secondary | ICD-10-CM | POA: Diagnosis not present

## 2018-11-23 DIAGNOSIS — Z7982 Long term (current) use of aspirin: Secondary | ICD-10-CM | POA: Diagnosis not present

## 2018-11-23 DIAGNOSIS — M47812 Spondylosis without myelopathy or radiculopathy, cervical region: Secondary | ICD-10-CM | POA: Diagnosis not present

## 2018-11-23 DIAGNOSIS — M8580 Other specified disorders of bone density and structure, unspecified site: Secondary | ICD-10-CM | POA: Diagnosis not present

## 2018-11-23 DIAGNOSIS — E785 Hyperlipidemia, unspecified: Secondary | ICD-10-CM | POA: Diagnosis not present

## 2018-11-23 DIAGNOSIS — G20 Parkinson's disease: Secondary | ICD-10-CM | POA: Diagnosis not present

## 2018-11-23 DIAGNOSIS — E039 Hypothyroidism, unspecified: Secondary | ICD-10-CM | POA: Diagnosis not present

## 2018-11-23 DIAGNOSIS — Z9181 History of falling: Secondary | ICD-10-CM | POA: Diagnosis not present

## 2018-11-23 DIAGNOSIS — G2581 Restless legs syndrome: Secondary | ICD-10-CM | POA: Diagnosis not present

## 2018-11-23 DIAGNOSIS — D510 Vitamin B12 deficiency anemia due to intrinsic factor deficiency: Secondary | ICD-10-CM | POA: Diagnosis not present

## 2018-11-23 DIAGNOSIS — Z8673 Personal history of transient ischemic attack (TIA), and cerebral infarction without residual deficits: Secondary | ICD-10-CM | POA: Diagnosis not present

## 2018-11-25 ENCOUNTER — Telehealth: Payer: Self-pay | Admitting: Neurology

## 2018-11-25 NOTE — Telephone Encounter (Signed)
Elizabeth [name] (PT) called regarding this patient and needing to continue therapy 1 x this week. One was missed due to the Holidays? Please Call [phone]. Thanks

## 2018-11-25 NOTE — Telephone Encounter (Signed)
Called and gave verbal order for PT visit.

## 2018-11-26 DIAGNOSIS — G20 Parkinson's disease: Secondary | ICD-10-CM | POA: Diagnosis not present

## 2018-11-26 DIAGNOSIS — G2581 Restless legs syndrome: Secondary | ICD-10-CM | POA: Diagnosis not present

## 2018-11-26 DIAGNOSIS — D510 Vitamin B12 deficiency anemia due to intrinsic factor deficiency: Secondary | ICD-10-CM | POA: Diagnosis not present

## 2018-11-26 DIAGNOSIS — M47812 Spondylosis without myelopathy or radiculopathy, cervical region: Secondary | ICD-10-CM | POA: Diagnosis not present

## 2018-11-26 DIAGNOSIS — E039 Hypothyroidism, unspecified: Secondary | ICD-10-CM | POA: Diagnosis not present

## 2018-11-26 DIAGNOSIS — Z7982 Long term (current) use of aspirin: Secondary | ICD-10-CM | POA: Diagnosis not present

## 2018-11-26 DIAGNOSIS — Z8673 Personal history of transient ischemic attack (TIA), and cerebral infarction without residual deficits: Secondary | ICD-10-CM | POA: Diagnosis not present

## 2018-11-26 DIAGNOSIS — M8580 Other specified disorders of bone density and structure, unspecified site: Secondary | ICD-10-CM | POA: Diagnosis not present

## 2018-11-26 DIAGNOSIS — Z9181 History of falling: Secondary | ICD-10-CM | POA: Diagnosis not present

## 2018-11-26 DIAGNOSIS — E785 Hyperlipidemia, unspecified: Secondary | ICD-10-CM | POA: Diagnosis not present

## 2018-11-30 ENCOUNTER — Telehealth: Payer: Self-pay | Admitting: Neurology

## 2018-11-30 NOTE — Telephone Encounter (Signed)
Spoke with patient - advised the number of therapy visits is dictated by her insurance and meeting her goals by the therapist. Made her aware that if she has reached those limits, we can not order more and have insurance cover it. She expressed understanding.

## 2018-11-30 NOTE — Telephone Encounter (Signed)
Patient is calling in needing to speak with you in regards to her therapy. They said she has run out of visits. She says she needs more. Please call her back at [phone]. Thanks!

## 2018-12-01 ENCOUNTER — Encounter: Payer: Self-pay | Admitting: Podiatry

## 2018-12-01 ENCOUNTER — Ambulatory Visit: Payer: Medicare Other | Admitting: Podiatry

## 2018-12-01 DIAGNOSIS — B351 Tinea unguium: Secondary | ICD-10-CM | POA: Diagnosis not present

## 2018-12-01 DIAGNOSIS — L84 Corns and callosities: Secondary | ICD-10-CM | POA: Diagnosis not present

## 2018-12-01 DIAGNOSIS — M79674 Pain in right toe(s): Secondary | ICD-10-CM | POA: Diagnosis not present

## 2018-12-01 NOTE — Progress Notes (Signed)
Subjective:   Patient ID: Elizabeth [name], female   DOB: 85 y.o.   MRN: [id number]   HPI Patient presents with significant callus subsecond metatarsal right third digit right and nail disease 1-5 right with incurvation of the beds pain and thickness that she cannot take care of herself   ROS      Objective:  Physical Exam  Neurovascular status unchanged with thick nailbeds right and keratotic lesion bilateral with patient in very poor health     Assessment:  Chronic lesions and nail disease right     Plan:  H&P condition reviewed debrided nailbeds right debrided lesions bilateral no iatrogenic bleeding and continue conservative care as needed

## 2018-12-06 ENCOUNTER — Telehealth: Payer: Self-pay | Admitting: Internal Medicine

## 2018-12-06 NOTE — Telephone Encounter (Signed)
FYI: Received a call from [facility] regarding referral Dr. Jenny [name] put in for her due to compression fractures.  They said the pt was confused about why the referral was placed and that Dr. Jenny [name] isn't her pcp.   I talked to Elizabeth [name] and she said she was aware of why she needs seen and she wanted to see them but has too many appts right now and she said she will call them when ready.

## 2018-12-06 NOTE — Telephone Encounter (Signed)
Ok noted  

## 2018-12-07 ENCOUNTER — Ambulatory Visit: Payer: Medicare Other | Admitting: Gastroenterology

## 2018-12-16 ENCOUNTER — Emergency Department (HOSPITAL_COMMUNITY): Payer: Medicare Other

## 2018-12-16 ENCOUNTER — Encounter: Payer: Self-pay | Admitting: Internal Medicine

## 2018-12-16 ENCOUNTER — Encounter (HOSPITAL_COMMUNITY): Payer: Self-pay

## 2018-12-16 ENCOUNTER — Observation Stay (HOSPITAL_COMMUNITY)
Admission: EM | Admit: 2018-12-16 | Discharge: 2018-12-20 | Disposition: A | Discharge status: Skilled Nursing Facility | Payer: Medicare Other | Attending: Internal Medicine | Admitting: Internal Medicine

## 2018-12-16 ENCOUNTER — Ambulatory Visit: Payer: Medicare Other | Admitting: Family Medicine

## 2018-12-16 ENCOUNTER — Ambulatory Visit: Payer: Medicare Other | Admitting: Internal Medicine

## 2018-12-16 VITALS — BP 158/78 | HR 63 | Temp 97.7°F | Wt 81.1 lb

## 2018-12-16 DIAGNOSIS — Z8249 Family history of ischemic heart disease and other diseases of the circulatory system: Secondary | ICD-10-CM | POA: Diagnosis not present

## 2018-12-16 DIAGNOSIS — K589 Irritable bowel syndrome without diarrhea: Secondary | ICD-10-CM | POA: Diagnosis not present

## 2018-12-16 DIAGNOSIS — M25552 Pain in left hip: Secondary | ICD-10-CM | POA: Diagnosis not present

## 2018-12-16 DIAGNOSIS — M4316 Spondylolisthesis, lumbar region: Secondary | ICD-10-CM | POA: Insufficient documentation

## 2018-12-16 DIAGNOSIS — G9389 Other specified disorders of brain: Secondary | ICD-10-CM | POA: Insufficient documentation

## 2018-12-16 DIAGNOSIS — Z7982 Long term (current) use of aspirin: Secondary | ICD-10-CM | POA: Insufficient documentation

## 2018-12-16 DIAGNOSIS — E538 Deficiency of other specified B group vitamins: Secondary | ICD-10-CM | POA: Insufficient documentation

## 2018-12-16 DIAGNOSIS — E785 Hyperlipidemia, unspecified: Secondary | ICD-10-CM | POA: Insufficient documentation

## 2018-12-16 DIAGNOSIS — M810 Age-related osteoporosis without current pathological fracture: Secondary | ICD-10-CM | POA: Diagnosis not present

## 2018-12-16 DIAGNOSIS — E039 Hypothyroidism, unspecified: Secondary | ICD-10-CM | POA: Diagnosis present

## 2018-12-16 DIAGNOSIS — M1612 Unilateral primary osteoarthritis, left hip: Secondary | ICD-10-CM | POA: Insufficient documentation

## 2018-12-16 DIAGNOSIS — W19XXXA Unspecified fall, initial encounter: Secondary | ICD-10-CM

## 2018-12-16 DIAGNOSIS — Z79890 Hormone replacement therapy (postmenopausal): Secondary | ICD-10-CM | POA: Insufficient documentation

## 2018-12-16 DIAGNOSIS — G20 Parkinson's disease: Secondary | ICD-10-CM | POA: Insufficient documentation

## 2018-12-16 DIAGNOSIS — I4891 Unspecified atrial fibrillation: Secondary | ICD-10-CM | POA: Diagnosis not present

## 2018-12-16 DIAGNOSIS — M545 Low back pain: Secondary | ICD-10-CM | POA: Diagnosis not present

## 2018-12-16 DIAGNOSIS — S32030D Wedge compression fracture of third lumbar vertebra, subsequent encounter for fracture with routine healing: Secondary | ICD-10-CM | POA: Diagnosis not present

## 2018-12-16 DIAGNOSIS — S32030A Wedge compression fracture of third lumbar vertebra, initial encounter for closed fracture: Secondary | ICD-10-CM | POA: Diagnosis present

## 2018-12-16 DIAGNOSIS — M549 Dorsalgia, unspecified: Secondary | ICD-10-CM | POA: Diagnosis present

## 2018-12-16 DIAGNOSIS — Z79899 Other long term (current) drug therapy: Secondary | ICD-10-CM | POA: Insufficient documentation

## 2018-12-16 DIAGNOSIS — M4306 Spondylolysis, lumbar region: Secondary | ICD-10-CM | POA: Diagnosis not present

## 2018-12-16 DIAGNOSIS — R296 Repeated falls: Secondary | ICD-10-CM | POA: Diagnosis not present

## 2018-12-16 DIAGNOSIS — E034 Atrophy of thyroid (acquired): Secondary | ICD-10-CM | POA: Diagnosis not present

## 2018-12-16 DIAGNOSIS — M4856XA Collapsed vertebra, not elsewhere classified, lumbar region, initial encounter for fracture: Principal | ICD-10-CM | POA: Insufficient documentation

## 2018-12-16 DIAGNOSIS — G20A1 Parkinson's disease without dyskinesia, without mention of fluctuations: Secondary | ICD-10-CM | POA: Diagnosis present

## 2018-12-16 DIAGNOSIS — Z8673 Personal history of transient ischemic attack (TIA), and cerebral infarction without residual deficits: Secondary | ICD-10-CM | POA: Insufficient documentation

## 2018-12-16 DIAGNOSIS — S3992XA Unspecified injury of lower back, initial encounter: Secondary | ICD-10-CM | POA: Diagnosis not present

## 2018-12-16 DIAGNOSIS — E440 Moderate protein-calorie malnutrition: Secondary | ICD-10-CM

## 2018-12-16 DIAGNOSIS — R269 Unspecified abnormalities of gait and mobility: Secondary | ICD-10-CM

## 2018-12-16 DIAGNOSIS — F419 Anxiety disorder, unspecified: Secondary | ICD-10-CM | POA: Diagnosis not present

## 2018-12-16 DIAGNOSIS — G2581 Restless legs syndrome: Secondary | ICD-10-CM | POA: Diagnosis not present

## 2018-12-16 LAB — URINALYSIS, ROUTINE W REFLEX MICROSCOPIC
Bilirubin Urine: NEGATIVE
Glucose, UA: NEGATIVE mg/dL
Hgb urine dipstick: NEGATIVE
Ketones, ur: NEGATIVE mg/dL
Leukocytes, UA: NEGATIVE
Nitrite: NEGATIVE
Protein, ur: NEGATIVE mg/dL
Specific Gravity, Urine: 1.008 (ref 1.005–1.030)
pH: 7 (ref 5.0–8.0)

## 2018-12-16 LAB — BASIC METABOLIC PANEL
ANION GAP: 8 (ref 5–15)
BUN: 17 mg/dL (ref 8–23)
CO2: 27 mmol/L (ref 22–32)
Calcium: 9.1 mg/dL (ref 8.9–10.3)
Chloride: 104 mmol/L (ref 98–111)
Creatinine, Ser: 0.64 mg/dL (ref 0.44–1.00)
GFR calc Af Amer: >60 mL/min (ref >60)
GFR calc non Af Amer: >60 mL/min (ref >60)
Glucose, Bld: 93 mg/dL (ref 70–99)
POTASSIUM: 3.6 mmol/L (ref 3.5–5.1)
Sodium: 139 mmol/L (ref 135–145)

## 2018-12-16 LAB — CBC WITH DIFFERENTIAL/PLATELET
ABS IMMATURE GRANULOCYTES: 0.02 10*3/uL (ref 0.00–0.07)
Basophils Absolute: 0 10*3/uL (ref 0.0–0.1)
Basophils Relative: 1 %
Eosinophils Absolute: 0.2 10*3/uL (ref 0.0–0.5)
Eosinophils Relative: 4 %
HCT: 38.3 % (ref 36.0–46.0)
Hemoglobin: 12 g/dL (ref 12.0–15.0)
Immature Granulocytes: 0 %
Lymphocytes Relative: 25 %
Lymphs Abs: 1.2 10*3/uL (ref 0.7–4.0)
MCH: 27.5 pg (ref 26.0–34.0)
MCHC: 31.3 g/dL (ref 30.0–36.0)
MCV: 87.8 fL (ref 80.0–100.0)
Monocytes Absolute: 0.5 10*3/uL (ref 0.1–1.0)
Monocytes Relative: 11 %
NEUTROS ABS: 2.9 10*3/uL (ref 1.7–7.7)
Neutrophils Relative %: 59 %
Platelets: 203 10*3/uL (ref 150–400)
RBC: 4.36 MIL/uL (ref 3.87–5.11)
RDW: 15.1 % (ref 11.5–15.5)
WBC: 4.9 10*3/uL (ref 4.0–10.5)
nRBC: 0 % (ref 0.0–0.2)

## 2018-12-16 LAB — CK: Total CK: 95 U/L (ref 38–234)

## 2018-12-16 MED ORDER — TRAMADOL HCL 50 MG PO TABS
50.0000 mg | ORAL_TABLET | Freq: Four times a day (QID) | ORAL | Status: DC | PRN
  Administered 2018-12-16 – 2018-12-17 (×2): 50 mg via ORAL
  Filled 2018-12-16 (×2): qty 1

## 2018-12-16 MED ORDER — ACETAMINOPHEN 325 MG PO TABS
650.0000 mg | ORAL_TABLET | Freq: Four times a day (QID) | ORAL | Status: DC | PRN
  Administered 2018-12-18 – 2018-12-19 (×2): 650 mg via ORAL
  Filled 2018-12-16 (×3): qty 2

## 2018-12-16 MED ORDER — MIRABEGRON ER 25 MG PO TB24
25.0000 mg | ORAL_TABLET | Freq: Every day | ORAL | Status: DC
  Administered 2018-12-16 – 2018-12-20 (×5): 25 mg via ORAL
  Filled 2018-12-16 (×6): qty 1

## 2018-12-16 MED ORDER — METHOCARBAMOL 500 MG PO TABS
500.0000 mg | ORAL_TABLET | Freq: Four times a day (QID) | ORAL | Status: DC | PRN
  Administered 2018-12-16 – 2018-12-19 (×3): 500 mg via ORAL
  Filled 2018-12-16 (×3): qty 1

## 2018-12-16 MED ORDER — ONDANSETRON HCL 4 MG PO TABS: 4.0000 mg | ORAL_TABLET | Freq: Four times a day (QID) | ORAL | Status: DC | PRN

## 2018-12-16 MED ORDER — VITAMIN B-12 1000 MCG PO TABS
1000.0000 ug | ORAL_TABLET | Freq: Every day | ORAL | Status: DC
  Administered 2018-12-17 – 2018-12-20 (×4): 1000 ug via ORAL
  Filled 2018-12-16 (×4): qty 1

## 2018-12-16 MED ORDER — ENOXAPARIN SODIUM 30 MG/0.3ML ~~LOC~~ SOLN
30.0000 mg | SUBCUTANEOUS | Status: DC
  Administered 2018-12-16 – 2018-12-18 (×3): 30 mg via SUBCUTANEOUS
  Filled 2018-12-16 (×4): qty 0.3

## 2018-12-16 MED ORDER — ONDANSETRON HCL 4 MG/2ML IJ SOLN: 4.0000 mg | Freq: Four times a day (QID) | INTRAMUSCULAR | Status: DC | PRN

## 2018-12-16 MED ORDER — ACETAMINOPHEN 500 MG PO TABS
1000.0000 mg | ORAL_TABLET | Freq: Once | ORAL | Status: AC
  Administered 2018-12-16: 1000 mg via ORAL
  Filled 2018-12-16: qty 2

## 2018-12-16 MED ORDER — DOCUSATE SODIUM 100 MG PO CAPS
100.0000 mg | ORAL_CAPSULE | Freq: Two times a day (BID) | ORAL | Status: DC
  Administered 2018-12-16 – 2018-12-19 (×5): 100 mg via ORAL
  Filled 2018-12-16 (×7): qty 1

## 2018-12-16 MED ORDER — SODIUM CHLORIDE 0.9% FLUSH: 3.0000 mL | INTRAVENOUS | Status: DC | PRN

## 2018-12-16 MED ORDER — ASPIRIN EC 81 MG PO TBEC
81.0000 mg | DELAYED_RELEASE_TABLET | Freq: Every day | ORAL | Status: DC
  Administered 2018-12-16 – 2018-12-20 (×5): 81 mg via ORAL
  Filled 2018-12-16 (×5): qty 1

## 2018-12-16 MED ORDER — ACETAMINOPHEN 650 MG RE SUPP: 650.0000 mg | Freq: Four times a day (QID) | RECTAL | Status: DC | PRN

## 2018-12-16 MED ORDER — CARBIDOPA-LEVODOPA ER 36.25-145 MG PO CPCR
1.0000 | ORAL_CAPSULE | Freq: Four times a day (QID) | ORAL | Status: DC
  Administered 2018-12-17 – 2018-12-20 (×11): 1 via ORAL

## 2018-12-16 MED ORDER — SODIUM CHLORIDE 0.9 % IV SOLN: 250.0000 mL | INTRAVENOUS | Status: DC | PRN

## 2018-12-16 MED ORDER — SODIUM CHLORIDE 0.9% FLUSH
3.0000 mL | Freq: Two times a day (BID) | INTRAVENOUS | Status: DC
  Administered 2018-12-16 – 2018-12-20 (×6): 3 mL via INTRAVENOUS

## 2018-12-16 MED ORDER — IBUPROFEN 200 MG PO TABS
400.0000 mg | ORAL_TABLET | Freq: Once | ORAL | Status: AC
  Administered 2018-12-16: 400 mg via ORAL
  Filled 2018-12-16: qty 2

## 2018-12-16 MED ORDER — LEVOTHYROXINE SODIUM 88 MCG PO TABS
88.0000 ug | ORAL_TABLET | Freq: Every day | ORAL | Status: DC
  Administered 2018-12-16 – 2018-12-20 (×5): 88 ug via ORAL
  Filled 2018-12-16 (×5): qty 1

## 2018-12-16 NOTE — ED Notes (Signed)
ED TO INPATIENT HANDOFF REPORT  Name/Age/Gender Elizabeth [name] 85 y.o. female  Code Status Code Status History    Date Active Date Inactive Code Status Order ID Comments User Context   05/23/2018 1801 05/24/2018 2043 Full Code [id number]  Julianne Rice, MD ED   09/11/2017 1937 09/14/2017 2014 Full Code [id number]  Ivor Costa, MD ED    Advance Directive Documentation     Most Recent Value  Type of Advance Directive  Healthcare Power of Attorney  Pre-existing out of facility DNR order (yellow form or pink MOST form)  -  "MOST" Form in Place?  -      Home/SNF/Other Home  Chief Complaint Hip Pain  Level of Care/Admitting Diagnosis ED Disposition    ED Disposition Condition [place]: [facility] [[id number]]  Level of Care: Med-Surg [16]  Diagnosis: Acute back pain [[id number]]  Admitting Physician: Bonnielee Haff [[id number]]  Attending Physician: Bonnielee Haff [[id number]]  PT Class (Do Not Modify): Observation [104]  PT Acc Code (Do Not Modify): Observation [10022]       Medical History Past Medical History:  Diagnosis Date  . Abnormal Pap smear of vagina 09/2006   Dr Nori Riis  . Abnormality of gait 05/24/2015   PMH R occipital infarct B12 deficiency H/o polio affected left leg  In Physical Therapy to improve balance & strength through [facility]  . Arthritis 12/25/2015  . Cervicalgia 01/17/2009   MRI 03/27/16:  IMPRESSION: 1. At C3-4 there is a mild broad-based disc bulge. Moderate bilateral facet arthropathy and uncovertebral degenerative changes resulting in moderate bilateral foraminal stenosis. 2. At C5-6 there is a broad-based disc osteophyte complex. Bilateral uncovertebral degenerative changes and facet arthropathy resulting in moderate bilateral foraminal stenosis.  . Fall 09/11/2017  . History of CVA (cerebrovascular accident) 07/20/2012   Chronic R occipital infarct ; stable on CT 05/21/2012 .Dr Jannifer Franklin , Neurology   . History of TIA  (transient ischemic attack) 01/26/2008  . HYPERLIPIDEMIA 01/26/2008   NMR Lipoprofile 2009: LDL 135 (1704/911), HDL 62, TG 131. LDL goal = < 100; ideally < 70. MGF MI in 49s No FH CVA  . Hypothyroidism   . Intention tremor 12/08/2013   Onset after fall 04/2012   . Irritable bowel syndrome (IBS) 01/17/2009   Dr. Unice Cobble  . Osteopenia    PMH fracture heel, wrist  . Parkinson disease ([place]) 06/25/2016   Dr Tat  . Pernicious anemia   . Post-polio syndrome    minor problems L leg  . Restless leg syndrome 12/25/2015   Dr Tat  . Subdural hygroma 09/11/2017  . Swallowing disorder 03/04/2017  . Urinary incontinence 09/07/2008   Dr McDiarmid, Urology   . Vitamin B12 deficiency   . Weakness 06/24/2017    Allergies Allergies  Allergen Reactions  . Atorvastatin Other (See Comments)    REACTION: Felt funny (only way patient could describe)  . Crestor [Rosuvastatin Calcium] Other (See Comments)    Leg cramps, muscle aches  . Vesicare [Solifenacin]     constipation    IV Location/Drains/Wounds Patient Lines/Drains/Airways Status   Active Line/Drains/Airways    Name:   Placement date:   Placement time:   Site:   Days:   Peripheral IV 12/16/18 Left Arm   12/16/18    1809    Arm   less than 1   Wound / Incision (Open or Dehisced) 09/11/17 Laceration Head Anterior;Mid   09/11/17    -  Head   461          Labs/Imaging Results for orders placed or performed during the hospital encounter of 12/16/18 (from the past 48 hour(s))  CBC with Differential     Status: None   Collection Time: 12/16/18  1:57 PM  Result Value Ref Range   WBC 4.9 4.0 - 10.5 K/uL   RBC 4.36 3.87 - 5.11 MIL/uL   Hemoglobin 12.0 12.0 - 15.0 g/dL   HCT 38.3 36.0 - 46.0 %   MCV 87.8 80.0 - 100.0 fL   MCH 27.5 26.0 - 34.0 pg   MCHC 31.3 30.0 - 36.0 g/dL   RDW 15.1 11.5 - 15.5 %   Platelets 203 150 - 400 K/uL   nRBC 0.0 0.0 - 0.2 %   Neutrophils Relative % 59 %   Neutro Abs 2.9 1.7 - 7.7 K/uL   Lymphocytes Relative  25 %   Lymphs Abs 1.2 0.7 - 4.0 K/uL   Monocytes Relative 11 %   Monocytes Absolute 0.5 0.1 - 1.0 K/uL   Eosinophils Relative 4 %   Eosinophils Absolute 0.2 0.0 - 0.5 K/uL   Basophils Relative 1 %   Basophils Absolute 0.0 0.0 - 0.1 K/uL   Immature Granulocytes 0 %   Abs Immature Granulocytes 0.02 0.00 - 0.07 K/uL    Comment: Performed at [facility], [place] [address] [place], [place] [address]  Basic metabolic panel     Status: None   Collection Time: 12/16/18  1:57 PM  Result Value Ref Range   Sodium 139 135 - 145 mmol/L   Potassium 3.6 3.5 - 5.1 mmol/L   Chloride 104 98 - 111 mmol/L   CO2 27 22 - 32 mmol/L   Glucose, Bld 93 70 - 99 mg/dL   BUN 17 8 - 23 mg/dL   Creatinine, Ser 0.64 0.44 - 1.00 mg/dL   Calcium 9.1 8.9 - 10.3 mg/dL   GFR calc non Af Amer >60 >60 mL/min   GFR calc Af Amer >60 >60 mL/min   Anion gap 8 5 - 15    Comment: Performed at [facility], [place] [address], [place], [place] [address]  Urinalysis, Routine w reflex microscopic     Status: None   Collection Time: 12/16/18  2:04 PM  Result Value Ref Range   Color, Urine YELLOW YELLOW   APPearance CLEAR CLEAR   Specific Gravity, Urine 1.008 1.005 - 1.030   pH 7.0 5.0 - 8.0   Glucose, UA NEGATIVE NEGATIVE mg/dL   Hgb urine dipstick NEGATIVE NEGATIVE   Bilirubin Urine NEGATIVE NEGATIVE   Ketones, ur NEGATIVE NEGATIVE mg/dL   Protein, ur NEGATIVE NEGATIVE mg/dL   Nitrite NEGATIVE NEGATIVE   Leukocytes, UA NEGATIVE NEGATIVE    Comment: Performed at [facility], [place] [address], [place], [place] [address]   Dg Chest 2 View  Result Date: 12/16/2018 CLINICAL DATA:  Two recent falls. EXAM: CHEST - 2 VIEW COMPARISON:  05/23/2018 and 11/27/2017. Thoracic spine dated 07/22/2012. FINDINGS: The cardiac silhouette remains borderline enlarged. Clear lungs with normal vascularity. Mild scoliosis. Approximately 35% midthoracic spine vertebral superior endplate  compression deformity with no visible acute fracture lines. This appears grossly unchanged since 11/27/2017. There is also an old, healed, displaced fracture of the sternum. No acute fractures are seen. IMPRESSION: No acute abnormality. Electronically Signed   By: Claudie Revering M.D.   On: 12/16/2018 14:24   Ct Head Wo Contrast  Result Date:  12/16/2018 CLINICAL DATA:  Mental status change. EXAM: CT HEAD WITHOUT CONTRAST TECHNIQUE: Contiguous axial images were obtained from the base of the skull through the vertex without intravenous contrast. COMPARISON:  05/23/2018 FINDINGS: Brain: Stable age related cerebral atrophy, ventriculomegaly and periventricular white matter disease. Remote left caudate and right occipital infarcts with encephalomalacia. No extra-axial fluid collections are identified. No CT findings for acute hemispheric infarction or intracranial hemorrhage. No mass lesions. The brainstem and cerebellum are normal. Vascular: Stable vascular calcifications. No definite aneurysm or hyperdense vessels. Skull: No skull fracture or bone lesions. Sinuses/Orbits: The paranasal sinuses and mastoid air cells are clear. Other: No scalp lesions or hematoma. IMPRESSION: 1. Stable age related cerebral atrophy, ventriculomegaly and advanced periventricular white matter disease. 2. Remote infarcts. 3. No acute intracranial findings. Electronically Signed   By: Marijo Sanes M.D.   On: 12/16/2018 15:22   Ct Lumbar Spine Wo Contrast  Result Date: 12/16/2018 CLINICAL DATA:  Left hip pain.  Fell twice over the past weekend. EXAM: CT LUMBAR SPINE WITHOUT CONTRAST TECHNIQUE: Multidetector CT imaging of the lumbar spine was performed without intravenous contrast administration. Multiplanar CT image reconstructions were also generated. COMPARISON:  Lumbar radiographs 10/19/2018 FINDINGS: Segmentation: There are five lumbar type vertebral bodies. The last full intervertebral disc space is labeled L5-S1. This correlates  with the lumbar radiographs. Alignment: The overall alignment is maintained. There is degenerative anterolisthesis of L3 and L4 due to facet disease. There is also a nondisplaced pars interarticularis fracture on the right at L3. Vertebrae: Significant superior endplate compression fracture of L3. This appears relatively stable when compared to prior radiographs from November 2019. There is mild retropulsion involving the posterosuperior aspect of the vertebral body but the spinal canal is generous in is no significant spinal canal compromise. There is also a pars defect on the right at L3. The pedicles are intact. Advanced osteoporosis. Paraspinal and other soft tissues: No significant paraspinal or retroperitoneal findings. Advanced calcifications involving the aorta. The visualized sacrum is intact. Disc levels: The spinal canal is generous. No significant spinal stenosis. There are broad-based bulging uncovered discs at L4-5 and L5-S1 due to the anterolisthesis but no significant spinal or foraminal stenosis. IMPRESSION: 1. Stable appearing L3 compression fracture. Mild retropulsion but no canal compromise. 2. Small nondisplaced pars fracture on the right at L3. 3. Degenerative/non isthmic spondylolisthesis of L3 and L4. 4. Very generous spinal canal without findings for significant spinal or foraminal stenosis. 5. Osteoporosis. Electronically Signed   By: Marijo Sanes M.D.   On: 12/16/2018 15:39   Ct Hip Left Wo Contrast  Result Date: 12/16/2018 CLINICAL DATA:  Left hip pain. The patient suffered 2 falls 5-6 days ago. Initial encounter. EXAM: CT OF THE LEFT HIP WITHOUT CONTRAST TECHNIQUE: Multidetector CT imaging of the left hip was performed according to the standard protocol. Multiplanar CT image reconstructions were also generated. COMPARISON:  Plain films left hip earlier today. FINDINGS: Bones/Joint/Cartilage The left hip is located. No fracture is identified. No focal bony lesion is seen. There is no  avascular necrosis of the left femoral head. Only minimal left hip joint space narrowing is identified. Ligaments Suboptimally assessed by CT. Muscles and Tendons Intact. Soft tissues Imaged intrapelvic contents demonstrate no acute abnormality. IMPRESSION: Negative for fracture or other acute abnormality. Mild left hip osteoarthritis. Electronically Signed   By: Inge Rise M.D.   On: 12/16/2018 15:18   Dg Hip Unilat With Pelvis 2-3 Views Left  Result Date: 12/16/2018 CLINICAL DATA:  85 year old  female with a history of fall and left hip pain EXAM: DG HIP (WITH OR WITHOUT PELVIS) 2-3V LEFT COMPARISON:  None. FINDINGS: Osteopenia. Bony pelvic ring intact with no acute displaced fracture. Bilateral hips projects normally over the acetabula. Degenerative changes of the hips.  No hip fracture identified. Degenerative changes of the lumbar spine. IMPRESSION: No acute bony abnormality. Osteopenia. Electronically Signed   By: Corrie Mckusick D.O.   On: 12/16/2018 12:49    Pending Labs [employer] (From admission, onward)    Start     Ordered   Signed and Occupational hygienist morning,   R     Signed and Held   Signed and Held  CK  Once,   R     Signed and Held          Vitals/Pain Today's Vitals   12/16/18 1213 12/16/18 1214 12/16/18 1414 12/16/18 1729  BP: (!) 152/72  (!) 157/118 111/82  Pulse: 75  81 85  Resp: 18  14 (!) 22  Temp: (!) 97.5 F (36.4 C)     TempSrc: Oral     SpO2: 100%  100% 98%  PainSc:  6       Isolation Precautions No active isolations  Medications Medications  acetaminophen (TYLENOL) tablet 1,000 mg (1,000 mg Oral Given 12/16/18 1641)  ibuprofen (ADVIL,MOTRIN) tablet 400 mg (400 mg Oral Given 12/16/18 1641)    Mobility walks with person assist

## 2018-12-16 NOTE — ED Provider Notes (Signed)
[facility] [place] DEPT Provider Note   CSN: [id number] Arrival date & time: 12/16/18  1213     History   Chief Complaint Chief Complaint  Patient presents with  . Hip Pain    HPI Elizabeth [name] is a 85 y.o. female.  HPI   85 yo F here with back pain, hip pain after falls. Pt has h/o Parkinson's, recurrent falls. She states that over the past week, she has fallen twice. The first episode was "her own fault" from trying to get somewhere she shouldn't with her walker, causing her to fall backwards and hit her head. The second episode, she states, her legs just "gave out" and she fell. She has since had progressively worsening aching, throbbing lower back pain, left hip pain. Pain is worse with bending and any kind of walking. She has had difficulty getting around the house today. No RLE sx. No headache, neck pain. No CP or SOB. No recent med changes and she states she has not had any urinary sx or other isgns of illness. Of note, she lives alone, has 3 hours of aid per day but that is all.  Past Medical History:  Diagnosis Date  . Abnormal Pap smear of vagina 09/2006   Dr Nori Riis  . Abnormality of gait 05/24/2015   PMH R occipital infarct B12 deficiency H/o polio affected left leg  In Physical Therapy to improve balance & strength through Noel Gerold  . Arthritis 12/25/2015  . Cervicalgia 01/17/2009   MRI 03/27/16:  IMPRESSION: 1. At C3-4 there is a mild broad-based disc bulge. Moderate bilateral facet arthropathy and uncovertebral degenerative changes resulting in moderate bilateral foraminal stenosis. 2. At C5-6 there is a broad-based disc osteophyte complex. Bilateral uncovertebral degenerative changes and facet arthropathy resulting in moderate bilateral foraminal stenosis.  . Fall 09/11/2017  . History of CVA (cerebrovascular accident) 07/20/2012   Chronic R occipital infarct ; stable on CT 05/21/2012 .Dr Jannifer Franklin , Neurology   . History of TIA (transient ischemic  attack) 01/26/2008  . HYPERLIPIDEMIA 01/26/2008   NMR Lipoprofile 2009: LDL 135 (1704/911), HDL 62, TG 131. LDL goal = < 100; ideally < 70. MGF MI in 45s No FH CVA  . Hypothyroidism   . Intention tremor 12/08/2013   Onset after fall 04/2012   . Irritable bowel syndrome (IBS) 01/17/2009   Dr. Unice Cobble  . Osteopenia    PMH fracture heel, wrist  . Parkinson disease ([place]) 06/25/2016   Dr Tat  . Pernicious anemia   . Post-polio syndrome    minor problems L leg  . Restless leg syndrome 12/25/2015   Dr Tat  . Subdural hygroma 09/11/2017  . Swallowing disorder 03/04/2017  . Urinary incontinence 09/07/2008   Dr McDiarmid, Urology   . Vitamin B12 deficiency   . Weakness 06/24/2017    Patient Active Problem List   Diagnosis Date Noted  . Pars defect of lumbar spine 12/16/2018  . Acute back pain 12/16/2018  . Compression fracture of L3 vertebra ([place]) 11/02/2018  . Right shoulder pain 11/02/2018  . Left groin pain 10/19/2018  . Low back pain 10/19/2018  . Dysuria 10/12/2018  . Osteoporosis 05/30/2018  . Poor posture 04/28/2018  . Physical deconditioning 04/28/2018  . Anxiety 03/02/2018  . Urinary frequency 03/02/2018  . Foot pain, bilateral 12/21/2017  . Bilateral leg edema 12/02/2017  . Overactive bladder 09/19/2017  . Post-polio syndrome   . Vitamin B12 deficiency   . Fall 09/11/2017  . Subdural hygroma  09/11/2017  . UTI (urinary tract infection) 06/24/2017  . Swallowing disorder 03/04/2017  . Parkinson disease ([place]) 06/25/2016  . Restless leg syndrome 12/25/2015  . Arthritis 12/25/2015  . Abnormality of gait 05/24/2015  . History of CVA (cerebrovascular accident) 07/20/2012  . Cervicalgia 01/17/2009  . Irritable bowel syndrome (IBS) 01/17/2009  . Urinary incontinence 09/07/2008  . Anemia 04/26/2008  . HYPERLIPIDEMIA 01/26/2008  . History of TIA (transient ischemic attack) 01/26/2008  . Hypothyroidism 01/04/2008  . Abnormal Pap smear of vagina 09/24/2006    Past Surgical  History:  Procedure Laterality Date  . BLADDER SUSPENSION  2000  . CATARACT EXTRACTION, BILATERAL     Dr. Gershon Crane  . no colonoscopy     "I never felt I needed one "  . YAG LASER APPLICATION Left    Dr. Gershon Crane     OB History   No obstetric history on file.      Home Medications    Prior to Admission medications   Medication Sig Start Date End Date Taking? Authorizing Provider  aspirin 81 MG tablet Take 81 mg by mouth daily.     Yes [provider]  Calcium Carbonate-Vitamin D (CALCIUM + D PO) Take 1 tablet by mouth daily. 600-400   Yes [provider]  Carbidopa-Levodopa ER (RYTARY) 36.25-145 MG CPCR Take 1 tablet by mouth 4 (four) times daily. 10/06/18  Yes Tat, Eustace Quail, DO  Fesoterodine Fumarate (TOVIAZ PO) Take 1 tablet by mouth daily.   Yes [provider]  levothyroxine (SYNTHROID, LEVOTHROID) 88 MCG tablet Take 1 tablet (88 mcg total) by mouth daily. 11/10/18  Yes Burns, Claudina Lick, MD  Mirabegron (MYRBETRIQ PO) Take 1 tablet by mouth daily.   Yes [provider]  Simethicone (GAS-X EXTRA STRENGTH) 125 MG CAPS Take 125 mg by mouth daily as needed (gas).   Yes [provider]  vitamin B-12 (CYANOCOBALAMIN) 1000 MCG tablet Take 1 tablet (1,000 mcg total) by mouth daily. Patient taking differently: Take 2,000 mcg by mouth daily.  08/06/16  Yes Burns, Claudina Lick, MD  tiZANidine (ZANAFLEX) 2 MG tablet Take 1 tablet (2 mg total) by mouth every 6 (six) hours as needed for muscle spasms. Patient not taking: Reported on 12/16/2018 10/19/18   Biagio Borg, MD  traMADol (ULTRAM) 50 MG tablet Take 1 tablet (50 mg total) by mouth every 6 (six) hours as needed. Patient not taking: Reported on 12/16/2018 10/19/18   Biagio Borg, MD  triamcinolone cream (KENALOG) 0.1 % Apply 1 application topically 2 (two) times daily. Patient not taking: Reported on 12/16/2018 10/12/18   Binnie Rail, MD    Family History Family History  Problem Relation Age of  Onset  . Heart attack Maternal Grandfather        70s  . Emphysema Mother   . Hypertension Mother   . Alzheimer's disease Father   . Breast cancer Maternal Grandmother   . Cancer Maternal Grandmother        breast  . Diabetes Neg Hx   . Stroke Neg Hx   . Parkinson's disease Neg Hx     Social History Social History   Tobacco Use  . Smoking status: Never Smoker  . Smokeless tobacco: Never Used  Substance Use Topics  . Alcohol use: Yes    Alcohol/week: 1.0 standard drinks    Types: 1 Glasses of wine per week    Comment: 0.6 oz per week  . Drug use: No  Allergies   Atorvastatin; Crestor [rosuvastatin calcium]; and Vesicare [solifenacin]   Review of Systems Review of Systems  Constitutional: Negative for chills, fatigue and fever.  HENT: Negative for congestion and rhinorrhea.   Eyes: Negative for visual disturbance.  Respiratory: Negative for cough, shortness of breath and wheezing.   Cardiovascular: Negative for chest pain and leg swelling.  Gastrointestinal: Negative for abdominal pain, diarrhea, nausea and vomiting.  Genitourinary: Negative for dysuria and flank pain.  Musculoskeletal: Positive for arthralgias, back pain and gait problem. Negative for neck pain and neck stiffness.  Skin: Negative for rash and wound.  Allergic/Immunologic: Negative for immunocompromised state.  Neurological: Negative for syncope, weakness and headaches.  All other systems reviewed and are negative.    Physical Exam Updated Vital Signs BP (!) 156/69 (BP Location: Left Arm)   Pulse 81   Temp (!) 97.5 F (36.4 C) (Oral)   Resp 14   SpO2 95%   Physical Exam Vitals signs and nursing note reviewed.  Constitutional:      General: She is not in acute distress.    Appearance: She is well-developed.  HENT:     Head: Normocephalic and atraumatic.  Eyes:     Conjunctiva/sclera: Conjunctivae normal.  Neck:     Musculoskeletal: Neck supple.     Comments: Marked kyphosis but no  midline or paraspinal TTP Cardiovascular:     Rate and Rhythm: Normal rate and regular rhythm.     Heart sounds: Normal heart sounds. No murmur. No friction rub.  Pulmonary:     Effort: Pulmonary effort is normal. No respiratory distress.     Breath sounds: Normal breath sounds. No wheezing or rales.  Abdominal:     General: There is no distension.     Palpations: Abdomen is soft.     Tenderness: There is no abdominal tenderness.  Skin:    General: Skin is warm.     Capillary Refill: Capillary refill takes less than 2 seconds.     Comments: Superficial abrasion/wound to left anterior shin.  Neurological:     Mental Status: She is alert and oriented to person, place, and time.     Motor: No abnormal muscle tone.     Spine Exam: Inspection/Palpation: Moderate TTP over midline lower lumbar spine. TTP with pROM of left hip, no bruising or deformity. Strength: 5/5 throughout LE bilaterally (hip flexion/extension, adduction/abduction; knee flexion/extension; foot dorsiflexion/plantarflexion, inversion/eversion; great toe inversion) Sensation: Intact to light touch in proximal and distal LE bilaterally Reflexes: 2+ quadriceps and achilles reflexes    ED Treatments / Results  Labs (all labs ordered are listed, but only abnormal results are displayed) Labs Reviewed  CBC WITH DIFFERENTIAL/PLATELET  BASIC METABOLIC PANEL  URINALYSIS, ROUTINE W REFLEX MICROSCOPIC  CK  BASIC METABOLIC PANEL    EKG EKG Interpretation  Date/Time:  Thursday December 16 2018 13:52:14 EST Ventricular Rate:  79 PR Interval:    QRS Duration: 93 QT Interval:  372 QTC Calculation: 427 R Axis:   -14 Text Interpretation:  Atrial fibrillation Multiple ventricular premature complexes No significant change since last tracing Confirmed by Duffy Bruce [phone]) on 12/16/2018 2:48:15 PM   Radiology Dg Chest 2 View  Result Date: 12/16/2018 CLINICAL DATA:  Two recent falls. EXAM: CHEST - 2 VIEW COMPARISON:   05/23/2018 and 11/27/2017. Thoracic spine dated 07/22/2012. FINDINGS: The cardiac silhouette remains borderline enlarged. Clear lungs with normal vascularity. Mild scoliosis. Approximately 35% midthoracic spine vertebral superior endplate compression deformity with no visible acute fracture lines. This  appears grossly unchanged since 11/27/2017. There is also an old, healed, displaced fracture of the sternum. No acute fractures are seen. IMPRESSION: No acute abnormality. Electronically Signed   By: Claudie Revering M.D.   On: 12/16/2018 14:24   Ct Head Wo Contrast  Result Date: 12/16/2018 CLINICAL DATA:  Mental status change. EXAM: CT HEAD WITHOUT CONTRAST TECHNIQUE: Contiguous axial images were obtained from the base of the skull through the vertex without intravenous contrast. COMPARISON:  05/23/2018 FINDINGS: Brain: Stable age related cerebral atrophy, ventriculomegaly and periventricular white matter disease. Remote left caudate and right occipital infarcts with encephalomalacia. No extra-axial fluid collections are identified. No CT findings for acute hemispheric infarction or intracranial hemorrhage. No mass lesions. The brainstem and cerebellum are normal. Vascular: Stable vascular calcifications. No definite aneurysm or hyperdense vessels. Skull: No skull fracture or bone lesions. Sinuses/Orbits: The paranasal sinuses and mastoid air cells are clear. Other: No scalp lesions or hematoma. IMPRESSION: 1. Stable age related cerebral atrophy, ventriculomegaly and advanced periventricular white matter disease. 2. Remote infarcts. 3. No acute intracranial findings. Electronically Signed   By: Marijo Sanes M.D.   On: 12/16/2018 15:22   Ct Lumbar Spine Wo Contrast  Result Date: 12/16/2018 CLINICAL DATA:  Left hip pain.  Fell twice over the past weekend. EXAM: CT LUMBAR SPINE WITHOUT CONTRAST TECHNIQUE: Multidetector CT imaging of the lumbar spine was performed without intravenous contrast administration.  Multiplanar CT image reconstructions were also generated. COMPARISON:  Lumbar radiographs 10/19/2018 FINDINGS: Segmentation: There are five lumbar type vertebral bodies. The last full intervertebral disc space is labeled L5-S1. This correlates with the lumbar radiographs. Alignment: The overall alignment is maintained. There is degenerative anterolisthesis of L3 and L4 due to facet disease. There is also a nondisplaced pars interarticularis fracture on the right at L3. Vertebrae: Significant superior endplate compression fracture of L3. This appears relatively stable when compared to prior radiographs from November 2019. There is mild retropulsion involving the posterosuperior aspect of the vertebral body but the spinal canal is generous in is no significant spinal canal compromise. There is also a pars defect on the right at L3. The pedicles are intact. Advanced osteoporosis. Paraspinal and other soft tissues: No significant paraspinal or retroperitoneal findings. Advanced calcifications involving the aorta. The visualized sacrum is intact. Disc levels: The spinal canal is generous. No significant spinal stenosis. There are broad-based bulging uncovered discs at L4-5 and L5-S1 due to the anterolisthesis but no significant spinal or foraminal stenosis. IMPRESSION: 1. Stable appearing L3 compression fracture. Mild retropulsion but no canal compromise. 2. Small nondisplaced pars fracture on the right at L3. 3. Degenerative/non isthmic spondylolisthesis of L3 and L4. 4. Very generous spinal canal without findings for significant spinal or foraminal stenosis. 5. Osteoporosis. Electronically Signed   By: Marijo Sanes M.D.   On: 12/16/2018 15:39   Ct Hip Left Wo Contrast  Result Date: 12/16/2018 CLINICAL DATA:  Left hip pain. The patient suffered 2 falls 5-6 days ago. Initial encounter. EXAM: CT OF THE LEFT HIP WITHOUT CONTRAST TECHNIQUE: Multidetector CT imaging of the left hip was performed according to the  standard protocol. Multiplanar CT image reconstructions were also generated. COMPARISON:  Plain films left hip earlier today. FINDINGS: Bones/Joint/Cartilage The left hip is located. No fracture is identified. No focal bony lesion is seen. There is no avascular necrosis of the left femoral head. Only minimal left hip joint space narrowing is identified. Ligaments Suboptimally assessed by CT. Muscles and Tendons Intact. Soft tissues Imaged intrapelvic contents  demonstrate no acute abnormality. IMPRESSION: Negative for fracture or other acute abnormality. Mild left hip osteoarthritis. Electronically Signed   By: Inge Rise M.D.   On: 12/16/2018 15:18   Dg Hip Unilat With Pelvis 2-3 Views Left  Result Date: 12/16/2018 CLINICAL DATA:  85 year old female with a history of fall and left hip pain EXAM: DG HIP (WITH OR WITHOUT PELVIS) 2-3V LEFT COMPARISON:  None. FINDINGS: Osteopenia. Bony pelvic ring intact with no acute displaced fracture. Bilateral hips projects normally over the acetabula. Degenerative changes of the hips.  No hip fracture identified. Degenerative changes of the lumbar spine. IMPRESSION: No acute bony abnormality. Osteopenia. Electronically Signed   By: Corrie Mckusick D.O.   On: 12/16/2018 12:49    Procedures Procedures (including critical care time)  Medications Ordered in ED Medications  aspirin EC tablet 81 mg (has no administration in time range)  traMADol (ULTRAM) tablet 50 mg (has no administration in time range)  levothyroxine (SYNTHROID, LEVOTHROID) tablet 88 mcg (has no administration in time range)  mirabegron ER (MYRBETRIQ) tablet 25 mg (has no administration in time range)  vitamin B-12 (CYANOCOBALAMIN) tablet 1,000 mcg (has no administration in time range)  Carbidopa-Levodopa ER 36.25-145 MG CPCR 1 tablet (has no administration in time range)  enoxaparin (LOVENOX) injection 30 mg (has no administration in time range)  sodium chloride flush (NS) 0.9 % injection 3 mL  (has no administration in time range)  sodium chloride flush (NS) 0.9 % injection 3 mL (has no administration in time range)  0.9 %  sodium chloride infusion (has no administration in time range)  acetaminophen (TYLENOL) tablet 650 mg (has no administration in time range)    Or  acetaminophen (TYLENOL) suppository 650 mg (has no administration in time range)  ondansetron (ZOFRAN) tablet 4 mg (has no administration in time range)    Or  ondansetron (ZOFRAN) injection 4 mg (has no administration in time range)  docusate sodium (COLACE) capsule 100 mg (has no administration in time range)  methocarbamol (ROBAXIN) tablet 500 mg (has no administration in time range)  acetaminophen (TYLENOL) tablet 1,000 mg (1,000 mg Oral Given 12/16/18 1641)  ibuprofen (ADVIL,MOTRIN) tablet 400 mg (400 mg Oral Given 12/16/18 1641)     Initial Impression / Assessment and Plan / ED Course  I have reviewed the triage vital signs and the nursing notes.  Pertinent labs & imaging results that were available during my care of the patient were reviewed by me and considered in my medical decision making (see chart for details).     85 yo F here w/ back pain, hip pain, inability to ambulate after fall. Imaging shows pars fx. D/w Dr. Sherwood Gambler - will start Aspen lumbar corset, with small abdominal panel, to be worn whenever upright or ambulating. Labs are otherwise reassuring. She has a superficial abrasion that needs topical ABX. Given her age, difficulty and pain w/ ambulation, will admit for PT/OT, pain control.  Final Clinical Impressions(s) / ED Diagnoses   Final diagnoses:  Lumbar pars defect  Compression fracture of L3 vertebra, initial encounter [facility])    ED Discharge Orders    None       Duffy Bruce, MD 12/16/18 2002

## 2018-12-16 NOTE — ED Notes (Signed)
Patient transported to CT 

## 2018-12-16 NOTE — Patient Instructions (Signed)
-  Proceed to the [facility] ED for evaluation of your left hip pain following your fracture.

## 2018-12-16 NOTE — ED Notes (Signed)
URINE CULTURE SENT WITH URINE SAMPLE.  

## 2018-12-16 NOTE — H&P (Addendum)
[facility] History and Physical  Anastyn Ayars [place]ord HWE:[id number] DOB: March 10, 1931 DOA: 12/16/2018   PCP: Binnie Rail, MD  Specialists: None  Chief Complaint: Back pain  HPI: Elizabeth [name] is a 85 y.o. female with a past medical history of Parkinson's disease, hypothyroidism, history of vitamin B12 deficiency, who lives at home mostly by herself.  She has aides who come to assist her during some part of the day.  Patient presented to her primary care provider this morning with complaints of falls over the weekend.  She had 2 falls as a result of losing her balance.  She denies any syncopal episodes.  Both times she fell on her buttocks and back.  She had pain in her lower back as well as the left hip.  After the second fall she could not get up from the floor and had to wait till her aide came in the next day.  Due to persistent pain she went to her primary care provider.  Due to concern for fractures they recommended patient be sent to the emergency department.  Patient has not been able to ambulate since her falls.  She mentions that the pain is 6-7 out of 10 in intensity both in her back and left hip.  She denies any shortness of breath.  No headaches.  Denies any head injuries.  No fever no chills.  No discomfort with urinating.  In the emergency department patient underwent work-up which did not reveal any fracture involving the left hip.  Pars fracture was identified involving the L3.  A compression fracture was noted involving L3 but this was thought to be stable compared to previous films.  No neurological deficits were noted.  Due to her significant pain and discomfort patient cannot ambulate.  She lives by herself at home.  She will need to be brought into the hospital for safety purposes and physical and Occupational Therapy and pain control.  Home Medications: Prior to Admission medications   Medication Sig Start Date End Date Taking? Authorizing Provider  aspirin 81 MG tablet Take  81 mg by mouth daily.     Yes [provider]  Calcium Carbonate-Vitamin D (CALCIUM + D PO) Take 1 tablet by mouth daily. 600-400   Yes [provider]  Carbidopa-Levodopa ER (RYTARY) 36.25-145 MG CPCR Take 1 tablet by mouth 4 (four) times daily. 10/06/18  Yes Tat, Eustace Quail, DO  Fesoterodine Fumarate (TOVIAZ PO) Take 1 tablet by mouth daily.   Yes [provider]  levothyroxine (SYNTHROID, LEVOTHROID) 88 MCG tablet Take 1 tablet (88 mcg total) by mouth daily. 11/10/18  Yes Burns, Claudina Lick, MD  Mirabegron (MYRBETRIQ PO) Take 1 tablet by mouth daily.   Yes [provider]  Simethicone (GAS-X EXTRA STRENGTH) 125 MG CAPS Take 125 mg by mouth daily as needed (gas).   Yes [provider]  vitamin B-12 (CYANOCOBALAMIN) 1000 MCG tablet Take 1 tablet (1,000 mcg total) by mouth daily. Patient taking differently: Take 2,000 mcg by mouth daily.  08/06/16  Yes Burns, Claudina Lick, MD  tiZANidine (ZANAFLEX) 2 MG tablet Take 1 tablet (2 mg total) by mouth every 6 (six) hours as needed for muscle spasms. Patient not taking: Reported on 12/16/2018 10/19/18   Biagio Borg, MD  traMADol (ULTRAM) 50 MG tablet Take 1 tablet (50 mg total) by mouth every 6 (six) hours as needed. Patient not taking: Reported on 12/16/2018 10/19/18   Biagio Borg, MD  triamcinolone cream (KENALOG) 0.1 %  Apply 1 application topically 2 (two) times daily. Patient not taking: Reported on 12/16/2018 10/12/18   Binnie Rail, MD    Allergies:  Allergies  Allergen Reactions  . Atorvastatin Other (See Comments)    REACTION: Felt funny (only way patient could describe)  . Crestor [Rosuvastatin Calcium] Other (See Comments)    Leg cramps, muscle aches  . Vesicare [Solifenacin]     constipation    Past Medical History: Past Medical History:  Diagnosis Date  . Abnormal Pap smear of vagina 09/2006   Dr Nori Riis  . Abnormality of gait 05/24/2015   PMH R occipital infarct B12 deficiency H/o polio  affected left leg  In Physical Therapy to improve balance & strength through Noel Gerold  . Arthritis 12/25/2015  . Cervicalgia 01/17/2009   MRI 03/27/16:  IMPRESSION: 1. At C3-4 there is a mild broad-based disc bulge. Moderate bilateral facet arthropathy and uncovertebral degenerative changes resulting in moderate bilateral foraminal stenosis. 2. At C5-6 there is a broad-based disc osteophyte complex. Bilateral uncovertebral degenerative changes and facet arthropathy resulting in moderate bilateral foraminal stenosis.  . Fall 09/11/2017  . History of CVA (cerebrovascular accident) 07/20/2012   Chronic R occipital infarct ; stable on CT 05/21/2012 .Dr Jannifer Franklin , Neurology   . History of TIA (transient ischemic attack) 01/26/2008  . HYPERLIPIDEMIA 01/26/2008   NMR Lipoprofile 2009: LDL 135 (1704/911), HDL 62, TG 131. LDL goal = < 100; ideally < 70. MGF MI in 60s No FH CVA  . Hypothyroidism   . Intention tremor 12/08/2013   Onset after fall 04/2012   . Irritable bowel syndrome (IBS) 01/17/2009   Dr. Unice Cobble  . Osteopenia    PMH fracture heel, wrist  . Parkinson disease ([place]) 06/25/2016   Dr Tat  . Pernicious anemia   . Post-polio syndrome    minor problems L leg  . Restless leg syndrome 12/25/2015   Dr Tat  . Subdural hygroma 09/11/2017  . Swallowing disorder 03/04/2017  . Urinary incontinence 09/07/2008   Dr McDiarmid, Urology   . Vitamin B12 deficiency   . Weakness 06/24/2017    Past Surgical History:  Procedure Laterality Date  . BLADDER SUSPENSION  2000  . CATARACT EXTRACTION, BILATERAL     Dr. Gershon Crane  . no colonoscopy     "I never felt I needed one "  . YAG LASER APPLICATION Left    Dr. Gershon Crane    Social History: She lives by herself.  She has aides who come and assist her during the daytime.  She uses a rolling walker to ambulate.  Denies any history of smoking or alcohol use.  Family History:  Family History  Problem Relation Age of Onset  . Heart attack Maternal  Grandfather        70s  . Emphysema Mother   . Hypertension Mother   . Alzheimer's disease Father   . Breast cancer Maternal Grandmother   . Cancer Maternal Grandmother        breast  . Diabetes Neg Hx   . Stroke Neg Hx   . Parkinson's disease Neg Hx      Review of Systems - History obtained from the patient General ROS: negative Psychological ROS: negative Ophthalmic ROS: negative ENT ROS: negative Allergy and Immunology ROS: negative Hematological and Lymphatic ROS: negative Endocrine ROS: negative Respiratory ROS: no cough, shortness of breath, or wheezing Cardiovascular ROS: no chest pain or dyspnea on exertion Gastrointestinal ROS: no abdominal pain, change in bowel habits, or  black or bloody stools Genito-Urinary ROS: no dysuria, trouble voiding, or hematuria Musculoskeletal ROS: as in hpi Neurological ROS: no TIA or stroke symptoms Dermatological ROS: negative  Physical Examination  Vitals:   12/16/18 1213 12/16/18 1414  BP: (!) 152/72 (!) 157/118  Pulse: 75 81  Resp: 18 14  Temp: (!) 97.5 F (36.4 C)   TempSrc: Oral   SpO2: 100% 100%    BP (!) 157/118   Pulse 81   Temp (!) 97.5 F (36.4 C) (Oral)   Resp 14   SpO2 100%   General appearance: alert, cooperative, appears stated age and no distress Head: Normocephalic, without obvious abnormality, atraumatic Eyes: conjunctivae/corneas clear. PERRL, EOM's intact. Throat: lips, mucosa, and tongue normal; teeth and gums normal Neck: no adenopathy, no carotid bruit, no JVD, supple, symmetrical, trachea midline and thyroid not enlarged, symmetric, no tenderness/mass/nodules Back: Tender to palpation in the lower back. Resp: clear to auscultation bilaterally Cardio: regular rate and rhythm, S1, S2 normal, no murmur, click, rub or gallop GI: soft, non-tender; bowel sounds normal; no masses,  no organomegaly Extremities: extremities normal, atraumatic, no cyanosis or edema.  Normal range of motion of the right  lower extremity.  Able to lift her left leg off the bed however has limited range of motion compared to the right. Pulses: 2+ and symmetric Skin: Skin color, texture, turgor normal. No rashes or lesions Lymph nodes: Cervical, supraclavicular, and axillary nodes normal. Neurologic: Alert and oriented x3.  Cranial nerves II to XII intact.  Motor strength equal bilateral upper extremities.  Limitation of the left lower extremity is due to recent fall left hip pain.   Labs on Admission: I have personally reviewed following labs and imaging studies  CBC: Recent Labs  Lab 12/16/18 1357  WBC 4.9  NEUTROABS 2.9  HGB 12.0  HCT 38.3  MCV 87.8  PLT 202   Basic Metabolic Panel: Recent Labs  Lab 12/16/18 1357  NA 139  K 3.6  CL 104  CO2 27  GLUCOSE 93  BUN 17  CREATININE 0.64  CALCIUM 9.1   GFR: Estimated Creatinine Clearance: 29.9 mL/min (by C-G formula based on SCr of 0.64 mg/dL).   Radiological Exams on Admission: Dg Chest 2 View  Result Date: 12/16/2018 CLINICAL DATA:  Two recent falls. EXAM: CHEST - 2 VIEW COMPARISON:  05/23/2018 and 11/27/2017. Thoracic spine dated 07/22/2012. FINDINGS: The cardiac silhouette remains borderline enlarged. Clear lungs with normal vascularity. Mild scoliosis. Approximately 35% midthoracic spine vertebral superior endplate compression deformity with no visible acute fracture lines. This appears grossly unchanged since 11/27/2017. There is also an old, healed, displaced fracture of the sternum. No acute fractures are seen. IMPRESSION: No acute abnormality. Electronically Signed   By: Claudie Revering M.D.   On: 12/16/2018 14:24   Ct Head Wo Contrast  Result Date: 12/16/2018 CLINICAL DATA:  Mental status change. EXAM: CT HEAD WITHOUT CONTRAST TECHNIQUE: Contiguous axial images were obtained from the base of the skull through the vertex without intravenous contrast. COMPARISON:  05/23/2018 FINDINGS: Brain: Stable age related cerebral atrophy,  ventriculomegaly and periventricular white matter disease. Remote left caudate and right occipital infarcts with encephalomalacia. No extra-axial fluid collections are identified. No CT findings for acute hemispheric infarction or intracranial hemorrhage. No mass lesions. The brainstem and cerebellum are normal. Vascular: Stable vascular calcifications. No definite aneurysm or hyperdense vessels. Skull: No skull fracture or bone lesions. Sinuses/Orbits: The paranasal sinuses and mastoid air cells are clear. Other: No scalp lesions or hematoma. IMPRESSION:  1. Stable age related cerebral atrophy, ventriculomegaly and advanced periventricular white matter disease. 2. Remote infarcts. 3. No acute intracranial findings. Electronically Signed   By: Marijo Sanes M.D.   On: 12/16/2018 15:22   Ct Lumbar Spine Wo Contrast  Result Date: 12/16/2018 CLINICAL DATA:  Left hip pain.  Fell twice over the past weekend. EXAM: CT LUMBAR SPINE WITHOUT CONTRAST TECHNIQUE: Multidetector CT imaging of the lumbar spine was performed without intravenous contrast administration. Multiplanar CT image reconstructions were also generated. COMPARISON:  Lumbar radiographs 10/19/2018 FINDINGS: Segmentation: There are five lumbar type vertebral bodies. The last full intervertebral disc space is labeled L5-S1. This correlates with the lumbar radiographs. Alignment: The overall alignment is maintained. There is degenerative anterolisthesis of L3 and L4 due to facet disease. There is also a nondisplaced pars interarticularis fracture on the right at L3. Vertebrae: Significant superior endplate compression fracture of L3. This appears relatively stable when compared to prior radiographs from November 2019. There is mild retropulsion involving the posterosuperior aspect of the vertebral body but the spinal canal is generous in is no significant spinal canal compromise. There is also a pars defect on the right at L3. The pedicles are intact. Advanced  osteoporosis. Paraspinal and other soft tissues: No significant paraspinal or retroperitoneal findings. Advanced calcifications involving the aorta. The visualized sacrum is intact. Disc levels: The spinal canal is generous. No significant spinal stenosis. There are broad-based bulging uncovered discs at L4-5 and L5-S1 due to the anterolisthesis but no significant spinal or foraminal stenosis. IMPRESSION: 1. Stable appearing L3 compression fracture. Mild retropulsion but no canal compromise. 2. Small nondisplaced pars fracture on the right at L3. 3. Degenerative/non isthmic spondylolisthesis of L3 and L4. 4. Very generous spinal canal without findings for significant spinal or foraminal stenosis. 5. Osteoporosis. Electronically Signed   By: Marijo Sanes M.D.   On: 12/16/2018 15:39   Ct Hip Left Wo Contrast  Result Date: 12/16/2018 CLINICAL DATA:  Left hip pain. The patient suffered 2 falls 5-6 days ago. Initial encounter. EXAM: CT OF THE LEFT HIP WITHOUT CONTRAST TECHNIQUE: Multidetector CT imaging of the left hip was performed according to the standard protocol. Multiplanar CT image reconstructions were also generated. COMPARISON:  Plain films left hip earlier today. FINDINGS: Bones/Joint/Cartilage The left hip is located. No fracture is identified. No focal bony lesion is seen. There is no avascular necrosis of the left femoral head. Only minimal left hip joint space narrowing is identified. Ligaments Suboptimally assessed by CT. Muscles and Tendons Intact. Soft tissues Imaged intrapelvic contents demonstrate no acute abnormality. IMPRESSION: Negative for fracture or other acute abnormality. Mild left hip osteoarthritis. Electronically Signed   By: Inge Rise M.D.   On: 12/16/2018 15:18   Dg Hip Unilat With Pelvis 2-3 Views Left  Result Date: 12/16/2018 CLINICAL DATA:  85 year old female with a history of fall and left hip pain EXAM: DG HIP (WITH OR WITHOUT PELVIS) 2-3V LEFT COMPARISON:  None.  FINDINGS: Osteopenia. Bony pelvic ring intact with no acute displaced fracture. Bilateral hips projects normally over the acetabula. Degenerative changes of the hips.  No hip fracture identified. Degenerative changes of the lumbar spine. IMPRESSION: No acute bony abnormality. Osteopenia. Electronically Signed   By: Corrie Mckusick D.O.   On: 12/16/2018 12:49    My interpretation of Electrocardiogram: Even though the computer has read it as atrial fibrillation it appears to be sinus rhythm.  It is a poor quality EKG but P waves are clearly seen.  PVCs  are seen.  Minimal left axis deviation.  No concerning ST or T wave changes.   Problem List  Active Problems:   Hypothyroidism   Abnormality of gait   Parkinson disease ([place])   Compression fracture of L3 vertebra (H[facility])   Pars defect of lumbar spine   Acute back pain   Assessment: This is a 85 year old Caucasian female with past medical history as stated earlier who presents after sustaining 2 mechanical falls at home.  She does have a pars fracture involving L3.  No hip fracture identified.  Due to her significant pain she has not been able to ambulate in the emergency department.  Plan:  1. Pars fracture involving L3 along with compression fracture involving L3 which is stable compared to previous films: ED provider has discussed with neurosurgery.  There is no role for surgical intervention.  A lumbar corset has been recommended by neurosurgery which has been ordered.  Pain control.  Muscle relaxants.  PT and OT evaluation.  Follow-up with neurosurgery in 2 months.  This will need to be with Dr. Sherwood Gambler.  2. Left hip pain: Fortunately no fractures identified.  She is able to lift her left leg up off the bed.  Pain control.  Physical and Occupational Therapy to evaluate.  3. Falls at home: Patient admits to having balance issues.  She does have Parkinson's which could be contributing to her falls.  She will benefit from physical and Occupational  Therapy evaluation.  She does have a history of vitamin B12 deficiency for which she does take supplements.  No infectious etiology identified.  UA is unremarkable.  Check CK level.  3.  History of Parkinson's disease: Continue with her home medications.  4.  Hypothyroidism: Continue with home medications  DVT Prophylaxis: Lovenox Code Status: Full code Family Communication: Discussed with the patient. Consults called: Neurosurgery over the phone: Dr. Sherwood Gambler   Severity of Illness: The appropriate patient status for this patient is OBSERVATION. Observation status is judged to be reasonable and necessary in order to provide the required intensity of service to ensure the patient's safety. The patient's presenting symptoms, physical exam findings, and initial radiographic and laboratory data in the context of their medical condition is felt to place them at decreased risk for further clinical deterioration. Furthermore, it is anticipated that the patient will be medically stable for discharge from the hospital within 2 midnights of admission. The following factors support the patient status of observation.   " The patient's presenting symptoms include left hip pain and lower back pain. " The physical exam findings include limitation in range of motion of the lower extremities. " The initial radiographic and laboratory data are concerning for pars fracture L3.   Further management decisions will depend on results of further testing and patient's response to treatment.   Janis Sol [employer]  [facility] operational officer on [email or web address]  12/16/2018, 5:04 PM

## 2018-12-16 NOTE — ED Notes (Signed)
Ortho tech has been contacted for patient to receive lumbar corsett.

## 2018-12-16 NOTE — Progress Notes (Signed)
Acute Office Visit     CC/Reason for Visit: Fall, left hip pain  HPI: Elizabeth [name] is a 85 y.o. female who is coming in today for the above mentioned reasons.  She has a history of Parkinson's disease.  Lives at home with aide assistance.  She comes in today with pelvic and left hip pain following a fall.  She tells me that she had 2 falls over the weekend.  The first 1 she was trying to maneuver her walker and bumped against a cocktail table, fell straight backwards onto her bottom.  She had her cell phone on her and was able to call her neighbor who came to assist her.  The second fall occurred the next day.  This time she was trying to disconnect the plug of the Christmas tree and again fell straight back onto her buttocks.  This time she had immediate pain of her left hip and pelvis to the point where she was unable to stand.  She did not have her cell phone on her so she was not able to call for assistance.  She spent the whole night on the floor until her aide came in the next day.  For the next 2 days she has been having constant left hip pain with movement.  She comes in today for evaluation.   Past Medical/Surgical History: Past Medical History:  Diagnosis Date  . Abnormal Pap smear of vagina 09/2006   Dr Nori Riis  . Abnormality of gait 05/24/2015   PMH R occipital infarct B12 deficiency H/o polio affected left leg  In Physical Therapy to improve balance & strength through Noel Gerold  . Arthritis 12/25/2015  . Cervicalgia 01/17/2009   MRI 03/27/16:  IMPRESSION: 1. At C3-4 there is a mild broad-based disc bulge. Moderate bilateral facet arthropathy and uncovertebral degenerative changes resulting in moderate bilateral foraminal stenosis. 2. At C5-6 there is a broad-based disc osteophyte complex. Bilateral uncovertebral degenerative changes and facet arthropathy resulting in moderate bilateral foraminal stenosis.  . Fall 09/11/2017  . History of CVA (cerebrovascular accident) 07/20/2012     Chronic R occipital infarct ; stable on CT 05/21/2012 .Dr Jannifer Franklin , Neurology   . History of TIA (transient ischemic attack) 01/26/2008  . HYPERLIPIDEMIA 01/26/2008   NMR Lipoprofile 2009: LDL 135 (1704/911), HDL 62, TG 131. LDL goal = < 100; ideally < 70. MGF MI in 34s No FH CVA  . Hypothyroidism   . Intention tremor 12/08/2013   Onset after fall 04/2012   . Irritable bowel syndrome (IBS) 01/17/2009   Dr. Unice Cobble  . Osteopenia    PMH fracture heel, wrist  . Parkinson disease ([place]) 06/25/2016   Dr Tat  . Pernicious anemia   . Post-polio syndrome    minor problems L leg  . Restless leg syndrome 12/25/2015   Dr Tat  . Subdural hygroma 09/11/2017  . Swallowing disorder 03/04/2017  . Urinary incontinence 09/07/2008   Dr McDiarmid, Urology   . Vitamin B12 deficiency   . Weakness 06/24/2017    Past Surgical History:  Procedure Laterality Date  . BLADDER SUSPENSION  2000  . CATARACT EXTRACTION, BILATERAL     Dr. Gershon Crane  . no colonoscopy     "I never felt I needed one "  . YAG LASER APPLICATION Left    Dr. Gershon Crane    Social History:  reports that she has never smoked. She has never used smokeless tobacco. She reports current alcohol use of about  1.0 standard drinks of alcohol per week. She reports that she does not use drugs.  Allergies: Allergies  Allergen Reactions  . Atorvastatin Other (See Comments)    REACTION: Felt funny (only way patient could describe)  . Crestor [Rosuvastatin Calcium] Other (See Comments)    Leg cramps, muscle aches  . Vesicare [Solifenacin]     constipation    Family History:  Family History  Problem Relation Age of Onset  . Heart attack Maternal Grandfather        70s  . Emphysema Mother   . Hypertension Mother   . Alzheimer's disease Father   . Breast cancer Maternal Grandmother   . Cancer Maternal Grandmother        breast  . Diabetes Neg Hx   . Stroke Neg Hx   . Parkinson's disease Neg Hx      Current Outpatient Medications:  .   aspirin 81 MG tablet, Take 81 mg by mouth daily.  , Disp: , Rfl:  .  Calcium Carbonate-Vitamin D (CALCIUM + D PO), Take 1 tablet by mouth daily. 600-400, Disp: , Rfl:  .  Carbidopa-Levodopa ER (RYTARY) 36.25-145 MG CPCR, Take 1 tablet by mouth 4 (four) times daily., Disp: 120 capsule, Rfl: 5 .  levothyroxine (SYNTHROID, LEVOTHROID) 88 MCG tablet, Take 1 tablet (88 mcg total) by mouth daily., Disp: 90 tablet, Rfl: 3 .  Simethicone (GAS-X EXTRA STRENGTH) 125 MG CAPS, Take 125 mg by mouth daily as needed (gas)., Disp: , Rfl:  .  tiZANidine (ZANAFLEX) 2 MG tablet, Take 1 tablet (2 mg total) by mouth every 6 (six) hours as needed for muscle spasms., Disp: 40 tablet, Rfl: 1 .  traMADol (ULTRAM) 50 MG tablet, Take 1 tablet (50 mg total) by mouth every 6 (six) hours as needed., Disp: 30 tablet, Rfl: 0 .  triamcinolone cream (KENALOG) 0.1 %, Apply 1 application topically 2 (two) times daily., Disp: 30 g, Rfl: 0 .  vitamin B-12 (CYANOCOBALAMIN) 1000 MCG tablet, Take 1 tablet (1,000 mcg total) by mouth daily., Disp: 90 tablet, Rfl: 3  Review of Systems:  13 point review of systems is negative except as per HPI.    Physical Exam: Vitals:   12/16/18 1126  BP: (!) 158/78  Pulse: 63  Temp: 97.7 F (36.5 C)  TempSrc: Oral  SpO2: 94%  Weight: 81 lb 1.6 oz (36.8 kg)    Body mass index is 15.84 kg/m.   Constitutional:  calm, uncomfortable, frail looking elderly lady Eyes: PERRL, lids and conjunctivae normal ENMT: Mucous membranes are moist. Neck: normal, supple, no masses, no thyromegaly Respiratory: clear to auscultation bilaterally, no wheezing, no crackles. Normal respiratory effort. No accessory muscle use.  Cardiovascular: Regular rate and rhythm, no murmurs / rubs / gallops. No extremity edema. 2+ pedal pulses. No carotid bruits.  Hip: No external rotation, extremity does not appear shortened but she is significantly tender to palpation to the lateral aspect of her left hip as well as in the  groin area.   Impression and Plan:  Acute hip pain, left  Fall, initial encounter  Parkinson disease ([place])  -Highly suspicious for acute left hip fracture versus pelvic fracture in an elderly frail patient with Parkinson's disease. -To expedite work-up will send straight to the emergency department today.    Patient Instructions  -Proceed to the [facility] ED for evaluation of your left hip pain following your fracture.     Lelon Frohlich, MD [facility]

## 2018-12-16 NOTE — Consult Note (Addendum)
Subjective: Called by Dr. Duffy Bruce regarding this patient who had fallen and is complaining of pain with flexion of the low back.  He explains that she is comfortable once upright.  He evaluated the patient with CT which revealed relative stability of a L3 superior endplate fracture previously noted on x-rays from October 19, 2018.  Also noted was a right L3 pars fracture.  Objective: Vital signs in last 24 hours: Vitals:   12/16/18 1213 12/16/18 1414  BP: (!) 152/72 (!) 157/118  Pulse: 75 81  Resp: 18 14  Temp: (!) 97.5 F (36.4 C)   TempSrc: Oral   SpO2: 100% 100%    CBC Recent Labs    12/16/18 1357  WBC 4.9  HGB 12.0  HCT 38.3  PLT 203   BMET Recent Labs    12/16/18 1357  NA 139  K 3.6  CL 104  CO2 27  GLUCOSE 93  BUN 17  CREATININE 0.64  CALCIUM 9.1    Studies/Results: Dg Chest 2 View  Result Date: 12/16/2018 CLINICAL DATA:  Two recent falls. EXAM: CHEST - 2 VIEW COMPARISON:  05/23/2018 and 11/27/2017. Thoracic spine dated 07/22/2012. FINDINGS: The cardiac silhouette remains borderline enlarged. Clear lungs with normal vascularity. Mild scoliosis. Approximately 35% midthoracic spine vertebral superior endplate compression deformity with no visible acute fracture lines. This appears grossly unchanged since 11/27/2017. There is also an old, healed, displaced fracture of the sternum. No acute fractures are seen. IMPRESSION: No acute abnormality. Electronically Signed   By: Claudie Revering M.D.   On: 12/16/2018 14:24   Ct Head Wo Contrast  Result Date: 12/16/2018 CLINICAL DATA:  Mental status change. EXAM: CT HEAD WITHOUT CONTRAST TECHNIQUE: Contiguous axial images were obtained from the base of the skull through the vertex without intravenous contrast. COMPARISON:  05/23/2018 FINDINGS: Brain: Stable age related cerebral atrophy, ventriculomegaly and periventricular white matter disease. Remote left caudate and right occipital infarcts with encephalomalacia. No  extra-axial fluid collections are identified. No CT findings for acute hemispheric infarction or intracranial hemorrhage. No mass lesions. The brainstem and cerebellum are normal. Vascular: Stable vascular calcifications. No definite aneurysm or hyperdense vessels. Skull: No skull fracture or bone lesions. Sinuses/Orbits: The paranasal sinuses and mastoid air cells are clear. Other: No scalp lesions or hematoma. IMPRESSION: 1. Stable age related cerebral atrophy, ventriculomegaly and advanced periventricular white matter disease. 2. Remote infarcts. 3. No acute intracranial findings. Electronically Signed   By: Marijo Sanes M.D.   On: 12/16/2018 15:22   Ct Lumbar Spine Wo Contrast  Result Date: 12/16/2018 CLINICAL DATA:  Left hip pain.  Fell twice over the past weekend. EXAM: CT LUMBAR SPINE WITHOUT CONTRAST TECHNIQUE: Multidetector CT imaging of the lumbar spine was performed without intravenous contrast administration. Multiplanar CT image reconstructions were also generated. COMPARISON:  Lumbar radiographs 10/19/2018 FINDINGS: Segmentation: There are five lumbar type vertebral bodies. The last full intervertebral disc space is labeled L5-S1. This correlates with the lumbar radiographs. Alignment: The overall alignment is maintained. There is degenerative anterolisthesis of L3 and L4 due to facet disease. There is also a nondisplaced pars interarticularis fracture on the right at L3. Vertebrae: Significant superior endplate compression fracture of L3. This appears relatively stable when compared to prior radiographs from November 2019. There is mild retropulsion involving the posterosuperior aspect of the vertebral body but the spinal canal is generous in is no significant spinal canal compromise. There is also a pars defect on the right at L3. The pedicles are intact. Advanced  osteoporosis. Paraspinal and other soft tissues: No significant paraspinal or retroperitoneal findings. Advanced calcifications  involving the aorta. The visualized sacrum is intact. Disc levels: The spinal canal is generous. No significant spinal stenosis. There are broad-based bulging uncovered discs at L4-5 and L5-S1 due to the anterolisthesis but no significant spinal or foraminal stenosis. IMPRESSION: 1. Stable appearing L3 compression fracture. Mild retropulsion but no canal compromise. 2. Small nondisplaced pars fracture on the right at L3. 3. Degenerative/non isthmic spondylolisthesis of L3 and L4. 4. Very generous spinal canal without findings for significant spinal or foraminal stenosis. 5. Osteoporosis. Electronically Signed   By: Marijo Sanes M.D.   On: 12/16/2018 15:39   Ct Hip Left Wo Contrast  Result Date: 12/16/2018 CLINICAL DATA:  Left hip pain. The patient suffered 2 falls 5-6 days ago. Initial encounter. EXAM: CT OF THE LEFT HIP WITHOUT CONTRAST TECHNIQUE: Multidetector CT imaging of the left hip was performed according to the standard protocol. Multiplanar CT image reconstructions were also generated. COMPARISON:  Plain films left hip earlier today. FINDINGS: Bones/Joint/Cartilage The left hip is located. No fracture is identified. No focal bony lesion is seen. There is no avascular necrosis of the left femoral head. Only minimal left hip joint space narrowing is identified. Ligaments Suboptimally assessed by CT. Muscles and Tendons Intact. Soft tissues Imaged intrapelvic contents demonstrate no acute abnormality. IMPRESSION: Negative for fracture or other acute abnormality. Mild left hip osteoarthritis. Electronically Signed   By: Inge Rise M.D.   On: 12/16/2018 15:18   Dg Hip Unilat With Pelvis 2-3 Views Left  Result Date: 12/16/2018 CLINICAL DATA:  85 year old female with a history of fall and left hip pain EXAM: DG HIP (WITH OR WITHOUT PELVIS) 2-3V LEFT COMPARISON:  None. FINDINGS: Osteopenia. Bony pelvic ring intact with no acute displaced fracture. Bilateral hips projects normally over the acetabula.  Degenerative changes of the hips.  No hip fracture identified. Degenerative changes of the lumbar spine. IMPRESSION: No acute bony abnormality. Osteopenia. Electronically Signed   By: Corrie Mckusick D.O.   On: 12/16/2018 12:49    Assessment/Plan: Reviewed the case with Dr. Ellender Hose and reviewed the patient's CT of the lumbar spine as well as the x-rays from November 2019.  There is not a role for surgical intervention at this time, and I have recommended immobilization in a Aspen lumbar corset with a small abdominal panel.  I recommended Dr. Ellender Hose contact the ortho tech who can then contact [employer] to fit the patient properly (particularly because of her small stature).  She will need to wear it whenever she is up and about, but should not wear it in bed.  I have recommended follow-up with me in the office in 2 months with x-rays.  She will need to continue to use the brace until she returns for follow-up.  Hosie Spangle, MD 12/16/2018, 4:43 PM
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
## 2018-12-16 NOTE — ED Notes (Addendum)
Placed pt on Purewick. When tech placed Purewick, tech noticed a small mass on the left buttocks near the vagina. Mass is round, ping-pong ball size, and skin color. RN made aware.

## 2018-12-17 ENCOUNTER — Other Ambulatory Visit: Payer: Self-pay

## 2018-12-17 DIAGNOSIS — E440 Moderate protein-calorie malnutrition: Secondary | ICD-10-CM

## 2018-12-17 LAB — BASIC METABOLIC PANEL
Anion gap: 7 (ref 5–15)
BUN: 20 mg/dL (ref 8–23)
CO2: 26 mmol/L (ref 22–32)
Calcium: 8.5 mg/dL — ABNORMAL LOW (ref 8.9–10.3)
Chloride: 107 mmol/L (ref 98–111)
Creatinine, Ser: 0.86 mg/dL (ref 0.44–1.00)
GFR calc Af Amer: >60 mL/min (ref >60)
GFR calc non Af Amer: >60 mL/min (ref >60)
GLUCOSE: 89 mg/dL (ref 70–99)
Potassium: 3.6 mmol/L (ref 3.5–5.1)
Sodium: 140 mmol/L (ref 135–145)

## 2018-12-17 MED ORDER — ENSURE ENLIVE PO LIQD
237.0000 mL | ORAL | Status: DC
  Administered 2018-12-17 – 2018-12-18 (×2): 237 mL via ORAL

## 2018-12-17 MED ORDER — DOCUSATE SODIUM 100 MG PO CAPS: 100.0000 mg | ORAL_CAPSULE | Freq: Two times a day (BID) | ORAL | 0 refills | Status: DC

## 2018-12-17 MED ORDER — TRAMADOL HCL 50 MG PO TABS: 50.0000 mg | ORAL_TABLET | Freq: Four times a day (QID) | ORAL | 0 refills | Status: DC | PRN

## 2018-12-17 MED ORDER — METHOCARBAMOL 500 MG PO TABS: 500.0000 mg | ORAL_TABLET | Freq: Four times a day (QID) | ORAL | 0 refills | Status: DC | PRN

## 2018-12-17 NOTE — Progress Notes (Addendum)
I was notified by the pharmacy at 1139 that they do not carry the scheduled medication called Carbidopa-Levodopa. The pt was notified. The pt said her family member will bring her medication sometime today. The pt was advised to notify me when the family arrives with the medication so I can take it down to pharmacy.   The pt was asked again if she contacted her family yet to bring the medication and the pt said she would shortly. She was asked multiple times throughout the shift. Joey Schum, her nephew was called, but no one answered.   I attempted to call the pt's nephew, Heron Sabins Alleman again regarding the pt's Carbidopa-Levodopa, but there was no answer. The pt reported that her family is bringing her medication, but no one has arrived yet.

## 2018-12-17 NOTE — Evaluation (Signed)
Occupational Therapy Evaluation Patient Details Name: Elizabeth [name] MRN: [id number] DOB: February 12, 1931 Today's Date: 12/17/2018    History of Present Illness Elizabeth [name] is a 85 y.o. female with a past medical history of Parkinson's disease, hypothyroidism, history of vitamin B12 deficiency, who lives at home   Clinical Impression   Pt admitted s/p fall and back pain. Pt currently with functional limitations due to the deficits listed below (see OT Problem List).  Pt will benefit from skilled OT to increase their safety and independence with ADL and functional mobility for ADL to facilitate discharge to venue listed below.      Follow Up Recommendations  SNF    Equipment Recommendations  None recommended by OT    Recommendations for Other Services       Precautions / Restrictions Precautions Precautions: Fall Required Braces or Orthoses: Spinal Brace Spinal Brace: Lumbar corset Restrictions Weight Bearing Restrictions: No      Mobility Bed Mobility Overal bed mobility: Needs Assistance Bed Mobility: Supine to Sit     Supine to sit: Min assist     General bed mobility comments: assist elevate trunk and scoot to EOB  Transfers Overall transfer level: Needs assistance Equipment used: Rolling walker (2 wheeled) Transfers: Sit to/from [facility] Sit to Stand: Min assist Stand pivot transfers: Min assist       General transfer comment: 4 attempts to come to stand, pt requires assist to rise or to pull up on RW while it is being held down by PT    Balance Overall balance assessment: Needs assistance;History of Falls(at least 2 falls in last month) Sitting-balance support: No upper extremity supported;Feet supported Sitting balance-Leahy Scale: Fair       Standing balance-Leahy Scale: Poor Standing balance comment: reliant on UEs for safe static stand                           ADL either performed or assessed with clinical judgement    ADL Overall ADL's : Needs assistance/impaired Eating/Feeding: Set up;Sitting   Grooming: Set up;Sitting   Upper Body Bathing: Minimal assistance;Sitting   Lower Body Bathing: Maximal assistance;Sit to/from stand;Cueing for sequencing;Cueing for safety   Upper Body Dressing : Moderate assistance;Sitting;Standing Upper Body Dressing Details (indicate cue type and reason): back brace Lower Body Dressing: Maximal assistance;Sit to/from stand;Cueing for safety;Cueing for sequencing   Toilet Transfer: Minimal assistance;RW;BSC   Toileting- Clothing Manipulation and Hygiene: Minimal assistance;Moderate assistance;Sit to/from stand;Cueing for sequencing;Cueing for safety         General ADL Comments: pt has limited A at home.  Will need ST SNF     Vision Patient Visual Report: No change from baseline       Perception     Praxis      Pertinent Vitals/Pain Pain Assessment: No/denies pain     Hand Dominance     Extremity/Trunk Assessment Upper Extremity Assessment Upper Extremity Assessment: Generalized weakness   Lower Extremity Assessment Lower Extremity Assessment: Generalized weakness   Cervical / Trunk Assessment Cervical / Trunk Assessment: Kyphotic;Other exceptions Cervical / Trunk Exceptions: cervical flexion   Communication Communication Communication: No difficulties   Cognition Arousal/Alertness: Awake/alert Behavior During Therapy: WFL for tasks assessed/performed Overall Cognitive Status: Within Functional Limits for tasks assessed Area of Impairment: Safety/judgement;Problem solving                         Safety/Judgement: Decreased awareness of  deficits   Problem Solving: Difficulty sequencing;Requires verbal cues                Home Living Family/patient expects to be discharged to:: Private residence Living Arrangements: Alone Available Help at Discharge: Personal care attendant(comfort keepers 3 hours per day) Type of Home:  House(townhouse ) Home Access: Stairs to enter Entrance Stairs-Number of Steps: 1   Home Layout: One level               Home Equipment: [place] - 2 wheels;Walker - 4 wheels          Prior Functioning/Environment Level of Independence: Needs assistance  Gait / Transfers Assistance Needed: uses Rollator or 2 wheeled RW              OT Problem List: Decreased strength;Decreased activity tolerance;Impaired balance (sitting and/or standing);Decreased safety awareness;Decreased knowledge of precautions;Decreased knowledge of use of DME or AE      OT Treatment/Interventions: Self-care/ADL training;Patient/family education;Energy conservation;DME and/or AE instruction    OT Goals(Current goals can be found in the care plan section) Acute Rehab OT Goals Patient Stated Goal: home, reluctantly agrees to rehab  OT Frequency: Min 2X/week    AM-PAC OT "6 Clicks" Daily Activity     Outcome Measure Help from another person eating meals?: A Little Help from another person taking care of personal grooming?: A Little Help from another person toileting, which includes using toliet, bedpan, or urinal?: A Little Help from another person bathing (including washing, rinsing, drying)?: A Little Help from another person to put on and taking off regular upper body clothing?: A Little Help from another person to put on and taking off regular lower body clothing?: A Lot 6 Click Score: 17   End of Session Equipment Utilized During Treatment: Rolling walker;Back brace Nurse Communication: Mobility status  Activity Tolerance: Patient tolerated treatment well Patient left: in chair;with call bell/phone within reach;with chair alarm set  OT Visit Diagnosis: Unsteadiness on feet (R26.81);Muscle weakness (generalized) (M62.81);Other abnormalities of gait and mobility (R26.89);History of falling (Z91.81);Repeated falls (R29.6)                Time: 1040-1100 OT Time Calculation (min): 20  min Charges:  OT General Charges $OT Visit: 1 Visit OT Evaluation $OT Eval Moderate Complexity: 1 Mod  Kari Baars, [place] [phone] Office- [phone]     Nicol Herbig, Edwena Felty D 12/17/2018, 1:14 PM

## 2018-12-17 NOTE — Clinical Social Work Note (Signed)
Clinical Social Work Assessment  Patient Details  Name: Elizabeth [name] MRN: [id number] Date of Birth: [birth year]  Date of referral:  12/17/18               Reason for consult:  Discharge Planning, Facility Placement                Permission sought to share information with:  Facility Art therapist granted to share information::  Yes, Verbal Permission Granted  Name::     Financial controller::  SNF   Relationship::  nephew   Contact Information:  [phone]  Housing/Transportation Living arrangements for the past 2 months:  Single Family Home Source of Information:  Patient Patient Interpreter Needed:  None Criminal Activity/Legal Involvement Pertinent to Current Situation/Hospitalization:  No - Comment as needed Significant Relationships:  Other Family Members Lives with:  Self Do you feel safe going back to the place where you live?  No Need for family participation in patient care:  No (Coment)  Care giving concerns:   Patient presented to her primary care provider this morning with complaints of falls over the weekend.  She had 2 falls as a result of losing her balance.  She denies any syncopal episodes.  Both times she fell on her buttocks and back.  She had pain in her lower back as well as the left hip.  After the second fall she could not get up from the floor and had to wait till her aide came in the next day.  Due to persistent pain she went to her primary care provider.  Due to concern for fractures they recommended patient be sent to the emergency department.  Patient has not been able to ambulate since her falls.   Pt admitted s/p fall and back pain.  Physical Therapy recommends SNF placement.   Social Worker assessment / plan:  CSW discussed discharge planning with the patient. The patient agreeable to SNF placement for rehab. Patient reports she lives in the home alone with a couple hours of daily support from the [place] program. Patient  reports she went to SNF about six months ago for short rehab at [facility]. Patient prefers to return.  CSW reached out to SNF.  SNF started [facility] authorization.   FL2 done.  PASR completed.   Plan: SNF  Employment status:  Retired Nurse, adult PT Recommendations:  [place] / Referral to community resources:  [place]  Patient/Family's Response to care:  Agreeable and Responding well to care.   Patient/Family's Understanding of and Emotional Response to Diagnosis, Current Treatment, and Prognosis:  The patient is knowledgeable of her diagnosis and follow up plan to SNF.   Emotional Assessment Appearance:  Appears stated age Attitude/Demeanor/Rapport:    Affect (typically observed):  Accepting, Pleasant Orientation:  Oriented to Self, Oriented to Place, Oriented to  Time, Oriented to Situation Alcohol / Substance use:  Not Applicable Psych involvement (Current and /or in the community):  No (Comment)  Discharge Needs  Concerns to be addressed:  Discharge Planning Concerns Readmission within the last 30 days:  No Current discharge risk:  Dependent with Mobility Barriers to Discharge:  [place], LCSW 12/17/2018, 2:23 PM

## 2018-12-17 NOTE — Evaluation (Signed)
Physical Therapy Evaluation Patient Details Name: Elizabeth [name] MRN: [id number] DOB: 04-23-1931 Today's Date: 12/17/2018   History of Present Illness  Elizabeth [name] is a 85 y.o. female with a past medical history of Parkinson's disease, hypothyroidism, history of vitamin B12 deficiency, who lives at home  Clinical Impression  Pt admitted with above diagnosis. Pt currently with functional limitations due to the deficits listed below (see PT Problem List).  Pt presents with unsteady gait, decr insight into her current deficits and requires assist with basic tasks. She has had at least 2 recent falls at home and is at risk for continued falls given weakness, balance deficits and safety concerns.   Pt will benefit from skilled PT to increase their independence and safety with mobility to allow discharge to the venue listed below.  Pt is reluctantly agreeable to SNF at this time   Follow Up Recommendations SNF;Supervision/Assistance - 24 hour    Equipment Recommendations  None recommended by PT    Recommendations for Other Services       Precautions / Restrictions Precautions Precautions: Fall Required Braces or Orthoses: Spinal Brace Spinal Brace: Lumbar corset Restrictions Weight Bearing Restrictions: No      Mobility  Bed Mobility Overal bed mobility: Needs Assistance Bed Mobility: Supine to Sit     Supine to sit: Min assist     General bed mobility comments: assist elevate trunk and scoot to EOB  Transfers Overall transfer level: Needs assistance Equipment used: Rolling walker (2 wheeled) Transfers: Sit to/from Stand Sit to Stand: Min assist         General transfer comment: 4 attempts to come to stand, pt requires assist to rise or to pull up on RW while it is being held down by PT  Ambulation/Gait Ambulation/Gait assistance: Min guard;Min assist Gait Distance (Feet): 70 Feet Assistive device: Rolling walker (2 wheeled) Gait Pattern/deviations: Step-through  pattern;Trunk flexed;Decreased stride length        Stairs            Wheelchair Mobility    Modified Rankin (Stroke Patients Only)       Balance Overall balance assessment: Needs assistance;History of Falls(at least 2 falls in last month) Sitting-balance support: No upper extremity supported;Feet supported Sitting balance-Leahy Scale: Fair       Standing balance-Leahy Scale: Poor Standing balance comment: reliant on UEs for safe static stand                             Pertinent Vitals/Pain Pain Assessment: No/denies pain    Home Living Family/patient expects to be discharged to:: Private residence Living Arrangements: Alone Available Help at Discharge: Personal care attendant(comfort keepers 3 hours per day) Type of Home: House(townhouse ) Home Access: Stairs to enter   Entrance Stairs-Number of Steps: 1 Home Layout: One level Home Equipment: Walker - 2 wheels;Walker - 4 wheels      Prior Function Level of Independence: Needs assistance   Gait / Transfers Assistance Needed: uses Rollator or 2 wheeled RW           Hand Dominance        Extremity/Trunk Assessment   Upper Extremity Assessment Upper Extremity Assessment: Defer to OT evaluation    Lower Extremity Assessment Lower Extremity Assessment: Generalized weakness    Cervical / Trunk Assessment Cervical / Trunk Assessment: Kyphotic;Other exceptions Cervical / Trunk Exceptions: cervical flexion  Communication   Communication: No difficulties  Cognition Arousal/Alertness: Awake/alert  Behavior During Therapy: WFL for tasks assessed/performed Overall Cognitive Status: Within Functional Limits for tasks assessed Area of Impairment: Safety/judgement;Problem solving                         Safety/Judgement: Decreased awareness of deficits   Problem Solving: Difficulty sequencing;Requires verbal cues        General Comments      Exercises     Assessment/Plan     PT Assessment Patient needs continued PT services  PT Problem List Decreased strength;Decreased activity tolerance;Decreased mobility;Decreased knowledge of use of DME;Decreased balance;Decreased safety awareness       PT Treatment Interventions DME instruction;Functional mobility training;Balance training;Patient/family education;Gait training;Therapeutic activities    PT Goals (Current goals can be found in the Care Plan section)  Acute Rehab PT Goals Patient Stated Goal: home, reluctantly agrees to rehab PT Goal Formulation: With patient Time For Goal Achievement: 12/31/18 Potential to Achieve Goals: Fair    Frequency Min 3X/week   Barriers to discharge        Co-evaluation               AM-PAC PT "6 Clicks" Mobility  Outcome Measure Help needed turning from your back to your side while in a flat bed without using bedrails?: A Little Help needed moving from lying on your back to sitting on the side of a flat bed without using bedrails?: A Little Help needed moving to and from a bed to a chair (including a wheelchair)?: A Little Help needed standing up from a chair using your arms (e.g., wheelchair or bedside chair)?: A Little Help needed to walk in hospital room?: A Little Help needed climbing 3-5 steps with a railing? : A Little 6 Click Score: 18    End of Session Equipment Utilized During Treatment: Gait belt Activity Tolerance: Patient tolerated treatment well Patient left: in chair;with call bell/phone within reach;with chair alarm set   PT Visit Diagnosis: Unsteadiness on feet (R26.81);Muscle weakness (generalized) (M62.81);History of falling (Z91.81)    Time: 0923-3007 PT Time Calculation (min) (ACUTE ONLY): 34 min   Charges:   PT Evaluation $PT Eval Low Complexity: 1 Low PT Treatments $Gait Training: 8-22 mins        Kenyon Ana, PT  Pager: [phone] Acute Rehab Dept [facility]): [phone]   12/17/2018   [facility] 12/17/2018, 12:57  PM

## 2018-12-17 NOTE — Progress Notes (Signed)
Elizabeth Poche, MD was notfied regarding OT and PT's recommendation for SNF placement. The social worker, Elmyra Ricks was notified as well. The social worker indicated that she will be speaking with the pt today regarding SNF. The pt has been informed.

## 2018-12-17 NOTE — Plan of Care (Signed)
Plan of care reviewed and discussed with the patient. 

## 2018-12-17 NOTE — Discharge Summary (Signed)
[facility]  Physician Discharge Summary   Patient ID: Elizabeth [name] MRN: [id number] DOB/AGE: Dec 01, 1930 85 y.o.  Admit date: 12/16/2018 Discharge date: 12/17/2018  PCP: Binnie Rail, MD  DISCHARGE DIAGNOSES:  Pars fracture involving L3 Old L3 compression fracture Acute back pain due to the above Parkinson's disease Hypothyroidism  RECOMMENDATIONS FOR OUTPATIENT FOLLOW UP: 1. Patient will need to wear the lumbar brace for 2 months at the very least.  She can take it off when she is in bed. 2. She will need to follow-up with Dr. Sherwood Gambler in 2 months   DISCHARGE CONDITION: fair  Diet recommendation: Regular as tolerated  Filed Weights   12/16/18 2030  Weight: 36.8 kg    INITIAL HISTORY: 85 y.o. female with a past medical history of Parkinson's disease, hypothyroidism, history of vitamin B12 deficiency, who lives at home mostly by herself.  She has aides who come to assist her during some part of the day.  Patient presented to her primary care provider this morning with complaints of falls over the weekend.  She had 2 falls as a result of losing her balance.  She denies any syncopal episodes.  Both times she fell on her buttocks and back.  She had pain in her lower back as well as the left hip.  After the second fall she could not get up from the floor and had to wait till her aide came in the next day.  Due to persistent pain she went to her primary care provider.  Due to concern for fractures they recommended patient be sent to the emergency department.  Patient has not been able to ambulate since her falls.  She mentions that the pain is 6-7 out of 10 in intensity both in her back and left hip.  She denies any shortness of breath.  No headaches.  Denies any head injuries.  No fever no chills.  No discomfort with urinating.  In the emergency department patient underwent work-up which did not reveal any fracture involving the left hip.  Pars fracture was identified  involving the L3.  A compression fracture was noted involving L3 but this was thought to be stable compared to previous films.  No neurological deficits were noted.  Due to her significant pain and discomfort patient cannot ambulate.  She lives by herself at home.  She will need to be brought into the hospital for safety purposes and physical and Occupational Therapy and pain control.  Consultations:  Dr. Sherwood Gambler with neurosurgery over the phone  Procedures:  None   HOSPITAL COURSE:   Pars fracture involving L3 along with compression fracture involving L3 which is stable compared to previous films ED provider has discussed with neurosurgery.  Dr. Sherwood Gambler recommended a lumbar brace.  Follow-up with him in 2 months time.  Patient was given pain medications and muscle relaxants.  Her symptoms have improved this morning.  She has been able to ambulate to the bathroom.  She was seen by physical and Occupational Therapy who recommend skilled nursing facility for short-term rehab.    Left hip pain Fortunately no fractures identified.  She did have restricted range of motion of the left lower extremity at the time of admission.  Much improved this morning.  Falls at home Patient admits to having balance issues.  She does have Parkinson's which could be contributing to her falls.  She does have a history of vitamin B12 deficiency for which she does take supplements.  No infectious etiology identified.  UA is unremarkable.    CK was normal.  Patient seen by PT and OT and short-term rehab at skilled nursing facility is recommended.  History of Parkinson's disease Continue with her home medications.  Stable.  Hypothyroidism Continue with home medications  Overall stable.  Patient has improved.  Medically stable for discharge.  However she will need to go to skilled nursing facility.  Social worker has been consulted for the same.   PERTINENT LABS:  The results of significant diagnostics  from this hospitalization (including imaging, microbiology, ancillary and laboratory) are listed below for reference.     Labs: Basic Metabolic Panel: Recent Labs  Lab 12/16/18 1357 12/17/18 0519  NA 139 140  K 3.6 3.6  CL 104 107  CO2 27 26  GLUCOSE 93 89  BUN 17 20  CREATININE 0.64 0.86  CALCIUM 9.1 8.5*   CBC: Recent Labs  Lab 12/16/18 1357  WBC 4.9  NEUTROABS 2.9  HGB 12.0  HCT 38.3  MCV 87.8  PLT 203   Cardiac Enzymes: Recent Labs  Lab 12/16/18 1902  CKTOTAL 95     IMAGING STUDIES Dg Chest 2 View  Result Date: 12/16/2018 CLINICAL DATA:  Two recent falls. EXAM: CHEST - 2 VIEW COMPARISON:  05/23/2018 and 11/27/2017. Thoracic spine dated 07/22/2012. FINDINGS: The cardiac silhouette remains borderline enlarged. Clear lungs with normal vascularity. Mild scoliosis. Approximately 35% midthoracic spine vertebral superior endplate compression deformity with no visible acute fracture lines. This appears grossly unchanged since 11/27/2017. There is also an old, healed, displaced fracture of the sternum. No acute fractures are seen. IMPRESSION: No acute abnormality. Electronically Signed   By: Claudie Revering M.D.   On: 12/16/2018 14:24   Ct Head Wo Contrast  Result Date: 12/16/2018 CLINICAL DATA:  Mental status change. EXAM: CT HEAD WITHOUT CONTRAST TECHNIQUE: Contiguous axial images were obtained from the base of the skull through the vertex without intravenous contrast. COMPARISON:  05/23/2018 FINDINGS: Brain: Stable age related cerebral atrophy, ventriculomegaly and periventricular white matter disease. Remote left caudate and right occipital infarcts with encephalomalacia. No extra-axial fluid collections are identified. No CT findings for acute hemispheric infarction or intracranial hemorrhage. No mass lesions. The brainstem and cerebellum are normal. Vascular: Stable vascular calcifications. No definite aneurysm or hyperdense vessels. Skull: No skull fracture or bone lesions.  Sinuses/Orbits: The paranasal sinuses and mastoid air cells are clear. Other: No scalp lesions or hematoma. IMPRESSION: 1. Stable age related cerebral atrophy, ventriculomegaly and advanced periventricular white matter disease. 2. Remote infarcts. 3. No acute intracranial findings. Electronically Signed   By: Marijo Sanes M.D.   On: 12/16/2018 15:22   Ct Lumbar Spine Wo Contrast  Result Date: 12/16/2018 CLINICAL DATA:  Left hip pain.  Fell twice over the past weekend. EXAM: CT LUMBAR SPINE WITHOUT CONTRAST TECHNIQUE: Multidetector CT imaging of the lumbar spine was performed without intravenous contrast administration. Multiplanar CT image reconstructions were also generated. COMPARISON:  Lumbar radiographs 10/19/2018 FINDINGS: Segmentation: There are five lumbar type vertebral bodies. The last full intervertebral disc space is labeled L5-S1. This correlates with the lumbar radiographs. Alignment: The overall alignment is maintained. There is degenerative anterolisthesis of L3 and L4 due to facet disease. There is also a nondisplaced pars interarticularis fracture on the right at L3. Vertebrae: Significant superior endplate compression fracture of L3. This appears relatively stable when compared to prior radiographs from November 2019. There is mild retropulsion involving the posterosuperior aspect of the vertebral body but the spinal canal is generous in  is no significant spinal canal compromise. There is also a pars defect on the right at L3. The pedicles are intact. Advanced osteoporosis. Paraspinal and other soft tissues: No significant paraspinal or retroperitoneal findings. Advanced calcifications involving the aorta. The visualized sacrum is intact. Disc levels: The spinal canal is generous. No significant spinal stenosis. There are broad-based bulging uncovered discs at L4-5 and L5-S1 due to the anterolisthesis but no significant spinal or foraminal stenosis. IMPRESSION: 1. Stable appearing L3  compression fracture. Mild retropulsion but no canal compromise. 2. Small nondisplaced pars fracture on the right at L3. 3. Degenerative/non isthmic spondylolisthesis of L3 and L4. 4. Very generous spinal canal without findings for significant spinal or foraminal stenosis. 5. Osteoporosis. Electronically Signed   By: Marijo Sanes M.D.   On: 12/16/2018 15:39   Ct Hip Left Wo Contrast  Result Date: 12/16/2018 CLINICAL DATA:  Left hip pain. The patient suffered 2 falls 5-6 days ago. Initial encounter. EXAM: CT OF THE LEFT HIP WITHOUT CONTRAST TECHNIQUE: Multidetector CT imaging of the left hip was performed according to the standard protocol. Multiplanar CT image reconstructions were also generated. COMPARISON:  Plain films left hip earlier today. FINDINGS: Bones/Joint/Cartilage The left hip is located. No fracture is identified. No focal bony lesion is seen. There is no avascular necrosis of the left femoral head. Only minimal left hip joint space narrowing is identified. Ligaments Suboptimally assessed by CT. Muscles and Tendons Intact. Soft tissues Imaged intrapelvic contents demonstrate no acute abnormality. IMPRESSION: Negative for fracture or other acute abnormality. Mild left hip osteoarthritis. Electronically Signed   By: Inge Rise M.D.   On: 12/16/2018 15:18   Dg Hip Unilat With Pelvis 2-3 Views Left  Result Date: 12/16/2018 CLINICAL DATA:  85 year old female with a history of fall and left hip pain EXAM: DG HIP (WITH OR WITHOUT PELVIS) 2-3V LEFT COMPARISON:  None. FINDINGS: Osteopenia. Bony pelvic ring intact with no acute displaced fracture. Bilateral hips projects normally over the acetabula. Degenerative changes of the hips.  No hip fracture identified. Degenerative changes of the lumbar spine. IMPRESSION: No acute bony abnormality. Osteopenia. Electronically Signed   By: Corrie Mckusick D.O.   On: 12/16/2018 12:49    DISCHARGE EXAMINATION: Vitals:   12/16/18 2030 12/16/18 2152 12/17/18  0501 12/17/18 1323  BP:  128/65 137/66 132/73  Pulse:  81 65 74  Resp: 16 15 15 17   Temp:  98.2 F (36.8 C) 98.2 F (36.8 C) 97.6 F (36.4 C)  TempSrc:    Oral  SpO2:  96% 99% 100%  Weight: 36.8 kg     Height: 5' (1.524 m)      General appearance: Awake alert.  In no distress Resp: Clear to auscultation bilaterally.  Normal effort Cardio: S1-S2 is normal regular.  No S3-S4.  No rubs murmurs or bruit GI: Abdomen is soft.  Nontender nondistended.  Bowel sounds are present normal.  No masses organomegaly Extremities: Improved range of motion of the left lower extremity. Neurologic: Alert and oriented x3.  No focal neurological deficits.    DISPOSITION: SNF potentially  Discharge Instructions    Call MD for:  difficulty breathing, headache or visual disturbances   Complete by:  As directed    Call MD for:  extreme fatigue   Complete by:  As directed    Call MD for:  persistant dizziness or light-headedness   Complete by:  As directed    Call MD for:  persistant nausea and vomiting   Complete by:  As directed    Call MD for:  severe uncontrolled pain   Complete by:  As directed    Call MD for:  temperature >100.4   Complete by:  As directed    Diet general   Complete by:  As directed    Discharge instructions   Complete by:  As directed    Please take your medications as prescribed.  Follow-up with your primary care provider in 1 week.  Please wear the lumbar brace as instructed.  You will also need to follow-up with Dr. Sherwood Gambler the neurosurgeon in 2 months.  You were cared for by a hospitalist during your hospital stay. If you have any questions about your discharge medications or the care you received while you were in the hospital after you are discharged, you can call the unit and asked to speak with the hospitalist on call if the hospitalist that took care of you is not available. Once you are discharged, your primary care physician will handle any further medical issues.  Please note that NO REFILLS for any discharge medications will be authorized once you are discharged, as it is imperative that you return to your primary care physician (or establish a relationship with a primary care physician if you do not have one) for your aftercare needs so that they can reassess your need for medications and monitor your lab values. If you do not have a primary care physician, you can call [phone] for a physician referral.   Increase activity slowly   Complete by:  As directed         Allergies as of 12/17/2018      Reactions   Atorvastatin Other (See Comments)   REACTION: Felt funny (only way patient could describe)   Crestor [rosuvastatin Calcium] Other (See Comments)   Leg cramps, muscle aches   Vesicare [solifenacin]    constipation      Medication List    STOP taking these medications   tiZANidine 2 MG tablet Commonly known as:  ZANAFLEX   TOVIAZ PO   triamcinolone cream 0.1 % Commonly known as:  KENALOG     TAKE these medications   aspirin 81 MG tablet Take 81 mg by mouth daily.   CALCIUM + D PO Take 1 tablet by mouth daily. 600-400   Carbidopa-Levodopa ER 36.25-145 MG Cpcr Commonly known as:  RYTARY Take 1 tablet by mouth 4 (four) times daily.   docusate sodium 100 MG capsule Commonly known as:  COLACE Take 1 capsule (100 mg total) by mouth 2 (two) times daily.   GAS-X EXTRA STRENGTH 125 MG Caps Generic drug:  Simethicone Take 125 mg by mouth daily as needed (gas).   levothyroxine 88 MCG tablet Commonly known as:  SYNTHROID, LEVOTHROID Take 1 tablet (88 mcg total) by mouth daily.   methocarbamol 500 MG tablet Commonly known as:  ROBAXIN Take 1 tablet (500 mg total) by mouth every 6 (six) hours as needed for muscle spasms.   MYRBETRIQ PO Take 1 tablet by mouth daily.   traMADol 50 MG tablet Commonly known as:  ULTRAM Take 1 tablet (50 mg total) by mouth every 6 (six) hours as needed.   vitamin B-12 1000 MCG tablet Commonly  known as:  CYANOCOBALAMIN Take 1 tablet (1,000 mcg total) by mouth daily. What changed:  how much to take        Follow-up Information    Burns, Claudina Lick, MD. Schedule an appointment as soon as possible for a visit  in 1 week(s).   Specialty:  Internal Medicine Contact information: [place] [address] [phone]        Jovita Gamma, MD. Schedule an appointment as soon as possible for a visit in 2 month(s).   Specialty:  Neurosurgery Contact information: [address] [place] [address] [place] [address] [phone]           TOTAL DISCHARGE TIME: 35 minutes  [place]  [facility] Pager on [email or web address]  12/17/2018, 1:27 PM

## 2018-12-17 NOTE — Progress Notes (Signed)
Initial Nutrition Assessment  DOCUMENTATION CODES:   Non-severe (moderate) malnutrition in context of chronic illness, Underweight  INTERVENTION:  - Will order Ensure Enlive once/day, each supplement provides 350 kcal and 20 grams of protein. - Continue to encourage intakes of meals, protein at each meal, and adequate hydration.    NUTRITION DIAGNOSIS:   Moderate Malnutrition related to chronic illness(Parkinson's disease) as evidenced by moderate fat depletion, moderate muscle depletion, severe muscle depletion.  GOAL:   Patient will meet greater than or equal to 90% of their needs  MONITOR:   PO intake, Supplement acceptance, Weight trends, Labs  REASON FOR ASSESSMENT:   Malnutrition Screening Tool  ASSESSMENT:   85 y.o. female with a past medical history of Parkinson's disease, hypothyroidism, and history of vitamin B12 deficiency. She lives at home alone and has 2 aides who assist her during the day. She had 2 falls recently. Both times she fell on her buttocks and back and had/has pain in her lower back and L hip. After the second fall, she could not get up from the floor and had to wait until her aide came in the next day. Patient has not been able to ambulate since her second fall. In the ED a compression fracture was noted involving L3 but this was thought to be stable compared to previous films.  Patient reports 100% intake of dinner last night; no documentation for this available and wonder if she ate dinner PTA. Patient reports 100% completion of breakfast which is confirmed in flow sheet. This meal provided 810 kal, 20 grams of protein. Patient reports that she always has a good appetite. She denies any issues with chewing. She does report issues with swallowing pills and that she sometimes experiences coughing with intake of thin liquids.   She states that she knows the importance of drinking enough water and that she tries but some days she does not do well with  drinking enough. Talked with patient about this and the importance of protein at all meals.   Per chart review, current weight is 81 lb and weight on 10/19/18 was 83 lb. This indicates 2 lb weight loss (2.4% body weight) in the past 2 months; not significant for time frame.   Medications reviewed; 100 mg colace BID, 88 mcg oral synthroid/day, 1000 mcg oral cyanocobalamin/day. Labs reviewed; Ca: 8.5 mg/dl.      NUTRITION - FOCUSED PHYSICAL EXAM:    Most Recent Value  Orbital Region  Mild depletion  Upper Arm Region  Moderate depletion  Thoracic and Lumbar Region  Unable to assess  Buccal Region  Moderate depletion  Temple Region  Mild depletion  Clavicle Bone Region  Severe depletion  Clavicle and Acromion Bone Region  Severe depletion  Scapular Bone Region  Moderate depletion  Dorsal Hand  Moderate depletion  Patellar Region  Unable to assess  Anterior Thigh Region  Unable to assess  Posterior Calf Region  Unable to assess  Edema (RD Assessment)  Unable to assess  Hair  Reviewed  Eyes  Reviewed  Mouth  Reviewed  Skin  Reviewed  Nails  Reviewed       Diet Order:   Diet Order            Diet regular Room service appropriate? Yes; Fluid consistency: Thin  Diet effective now              EDUCATION NEEDS:   Education needs have been addressed  Skin:  Skin Assessment: Reviewed RN Assessment  Last BM:  1/23  Height:   Ht Readings from Last 1 Encounters:  12/16/18 5' (1.524 m)    Weight:   Wt Readings from Last 1 Encounters:  12/16/18 36.8 kg    Ideal Body Weight:  45.45 kg  BMI:  Body mass index is 15.84 kg/m.  Estimated Nutritional Needs:   Kcal:  1180-1365 kcal  Protein:  55-65 grams  Fluid:  >/= 1.5 L/day     Jarome Matin, MS, RD, LDN, [facility] Inpatient Clinical Dietitian Pager # [phone] After hours/weekend pager # [phone]

## 2018-12-17 NOTE — NC FL2 (Addendum)
[place] LEVEL OF CARE SCREENING TOOL     IDENTIFICATION  Patient Name: Elizabeth [name] Birthdate: 1931/01/28 Sex: female Admission Date (Current Location): 12/16/2018  [facility] and [place] Number:  Herbalist and Address:  [facility],  [place] [address], [place]      Provider Number: [id number]  Attending Physician Name and Address:  Bonnielee Haff, MD  Relative Name and Phone Number:       Current Level of Care: Hospital Recommended Level of Care: [place] Prior Approval Number:    Date Approved/Denied:   PASRR Number:  [id number]   Discharge Plan:      Current Diagnoses: Patient Active Problem List   Diagnosis Date Noted  . Malnutrition of moderate degree 12/17/2018  . Pars defect of lumbar spine 12/16/2018  . Acute back pain 12/16/2018  . Compression fracture of L3 vertebra ([place]) 11/02/2018  . Right shoulder pain 11/02/2018  . Left groin pain 10/19/2018  . Low back pain 10/19/2018  . Dysuria 10/12/2018  . Osteoporosis 05/30/2018  . Poor posture 04/28/2018  . Physical deconditioning 04/28/2018  . Anxiety 03/02/2018  . Urinary frequency 03/02/2018  . Foot pain, bilateral 12/21/2017  . Bilateral leg edema 12/02/2017  . Overactive bladder 09/19/2017  . Post-polio syndrome   . Vitamin B12 deficiency   . Fall 09/11/2017  . Subdural hygroma 09/11/2017  . UTI (urinary tract infection) 06/24/2017  . Swallowing disorder 03/04/2017  . Parkinson disease ([place]) 06/25/2016  . Restless leg syndrome 12/25/2015  . Arthritis 12/25/2015  . Abnormality of gait 05/24/2015  . History of CVA (cerebrovascular accident) 07/20/2012  . Cervicalgia 01/17/2009  . Irritable bowel syndrome (IBS) 01/17/2009  . Urinary incontinence 09/07/2008  . Anemia 04/26/2008  . HYPERLIPIDEMIA 01/26/2008  . History of TIA (transient ischemic attack) 01/26/2008  . Hypothyroidism 01/04/2008  . Abnormal Pap smear of vagina 09/24/2006     Orientation RESPIRATION BLADDER Height & Weight     Self, Time, Situation, Place  Normal Continent Weight: 81 lb 2.1 oz (36.8 kg) Height:  5' (152.4 cm)  BEHAVIORAL SYMPTOMS/MOOD NEUROLOGICAL BOWEL NUTRITION STATUS      Continent (Regular )  AMBULATORY STATUS COMMUNICATION OF NEEDS Skin   Extensive Assist Verbally Normal                       Personal Care Assistance Level of Assistance  Feeding, Dressing, Bathing Bathing Assistance: Maximum assistance Feeding assistance: Independent Dressing Assistance: Independent     Functional Limitations Info  (P) Sight, Hearing, Speech Sight Info: Adequate Hearing Info: Adequate Speech Info: Adequate    SPECIAL CARE FACTORS FREQUENCY  PT (By licensed PT), OT (By licensed OT)     PT Frequency: 5x/week OT Frequency: 5x/week             Contractures Contractures Info: Not present    Additional Factors Info  Insulin Sliding Scale Code Status Info: Fullcode  Allergies Info: Allergies: Atorvastatin, Crestor Rosuvastatin Calcium, Vesicare Solifenacin           Current Medications (12/17/2018):  This is the current hospital active medication list Current Facility-Administered Medications  Medication Dose Route Frequency Provider Last Rate Last Dose  . 0.9 %  sodium chloride infusion  250 mL Intravenous PRN Bonnielee Haff, MD      . acetaminophen (TYLENOL) tablet 650 mg  650 mg Oral Q6H PRN Bonnielee Haff, MD       Or  . acetaminophen (TYLENOL)  suppository 650 mg  650 mg Rectal Q6H PRN Bonnielee Haff, MD      . aspirin EC tablet 81 mg  81 mg Oral Daily Bonnielee Haff, MD   81 mg at 12/17/18 0850  . Carbidopa-Levodopa ER 36.25-145 MG CPCR 1 tablet  1 tablet Oral QID Bonnielee Haff, MD      . docusate sodium (COLACE) capsule 100 mg  100 mg Oral BID Bonnielee Haff, MD   100 mg at 12/17/18 0851  . enoxaparin (LOVENOX) injection 30 mg  30 mg Subcutaneous Q24H Bonnielee Haff, MD   30 mg at 12/16/18 2042  . feeding  supplement (ENSURE ENLIVE) (ENSURE ENLIVE) liquid 237 mL  237 mL Oral Q24H Bonnielee Haff, MD   237 mL at 12/17/18 1411  . levothyroxine (SYNTHROID, LEVOTHROID) tablet 88 mcg  88 mcg Oral Daily Bonnielee Haff, MD   88 mcg at 12/17/18 9892  . methocarbamol (ROBAXIN) tablet 500 mg  500 mg Oral Q6H PRN Bonnielee Haff, MD   500 mg at 12/16/18 2034  . mirabegron ER (MYRBETRIQ) tablet 25 mg  25 mg Oral Daily Bonnielee Haff, MD   25 mg at 12/17/18 0851  . ondansetron (ZOFRAN) tablet 4 mg  4 mg Oral Q6H PRN Bonnielee Haff, MD       Or  . ondansetron [facility]) injection 4 mg  4 mg Intravenous Q6H PRN Bonnielee Haff, MD      . sodium chloride flush (NS) 0.9 % injection 3 mL  3 mL Intravenous Q12H Bonnielee Haff, MD   3 mL at 12/17/18 0851  . sodium chloride flush (NS) 0.9 % injection 3 mL  3 mL Intravenous PRN Bonnielee Haff, MD      . traMADol Veatrice Bourbon) tablet 50 mg  50 mg Oral Q6H PRN Bonnielee Haff, MD   50 mg at 12/17/18 0850  . vitamin B-12 (CYANOCOBALAMIN) tablet 1,000 mcg  1,000 mcg Oral Daily Bonnielee Haff, MD   1,000 mcg at 12/17/18 1194     Discharge Medications: Please see discharge summary for a list of discharge medications.  Relevant Imaging Results:  Relevant Lab Results:   Additional Information [id number]  Lia Hopping, LCSW

## 2018-12-18 DIAGNOSIS — E034 Atrophy of thyroid (acquired): Secondary | ICD-10-CM | POA: Diagnosis not present

## 2018-12-18 DIAGNOSIS — G20 Parkinson's disease: Secondary | ICD-10-CM | POA: Diagnosis not present

## 2018-12-18 DIAGNOSIS — M4306 Spondylolysis, lumbar region: Secondary | ICD-10-CM | POA: Diagnosis not present

## 2018-12-18 DIAGNOSIS — S32030D Wedge compression fracture of third lumbar vertebra, subsequent encounter for fracture with routine healing: Secondary | ICD-10-CM | POA: Diagnosis not present

## 2018-12-18 NOTE — Progress Notes (Signed)
[facility] PROGRESS NOTE  Elizabeth [name] YOV:[id number] DOB: 1931-11-14 DOA: 12/16/2018  PCP: Binnie Rail, MD  Brief History/Interval Summary: 85 y.o.femalewith a past medical history of Parkinson's disease, hypothyroidism, history of vitamin B12 deficiency, who lives at home mostly by herself. She has aides who come to assist her during some part of the day. Patient presented to her primary care provider this morning with complaints of falls over the weekend. She had 2 falls as a result of losing her balance. She denies any syncopal episodes. Both times she fell on her buttocks and back. She had pain in her lower back as well as the left hip. After the second fall she could not get up from the floor and had to wait till her aide came in the next day. Due to persistent pain she went to her primary care provider. Due to concern for fractures they recommended patient be sent to the emergency department. Patient has not been able to ambulate since her falls. She mentions that the pain is 6-7 out of 10 in intensity both in her back and left hip. She denies any shortness of breath. No headaches. Denies any head injuries. No fever no chills. No discomfort with urinating.  In the emergency department patient underwent work-up which did not reveal any fracture involving the left hip. Pars fracture was identified involving the L3. A compression fracture was noted involving L3 but this was thought to be stable compared to previous films. No neurological deficits were noted. Due to her significant pain and discomfort patient could not ambulate. She lives by herself at home. She was brought into the hospital for safety purposes and physical and Occupational Therapy and pain control.  Consultations:  Dr. Sherwood Gambler with neurosurgery over the phone  Procedures:  None   Antibiotics: None  Subjective/Interval History: Patient upset that it has taken very long for the  breakfast to arrive.  Otherwise she denies any complaints including pain issues.  Pain in the back and the left hip is present but not as severe as before.    Objective:  Vital Signs  Vitals:   12/17/18 1323 12/17/18 2221 12/18/18 0436 12/18/18 0600  BP: 132/73 (!) 162/65 (!) 167/70 (!) 157/71  Pulse: 74 78 72 71  Resp: 17 16 16    Temp: 97.6 F (36.4 C) 98 F (36.7 C) 97.7 F (36.5 C)   TempSrc: Oral Oral Oral   SpO2: 100% 94% 98%   Weight:      Height:        Intake/Output Summary (Last 24 hours) at 12/18/2018 1137 Last data filed at 12/18/2018 1002 Gross per 24 hour  Intake 798 ml  Output 400 ml  Net 398 ml   Filed Weights   12/16/18 2030  Weight: 36.8 kg    General appearance: Awake alert.  In no distress Resp: Clear to auscultation bilaterally.  Normal effort Cardio: S1-S2 is normal regular.  No S3-S4.  No rubs murmurs or bruit GI: Abdomen is soft.  Nontender nondistended.  Bowel sounds are present normal.  No masses organomegaly Extremities: Improved range of motion noted in the left lower extremity. Neurologic: Alert and oriented x3.  No focal neurological deficits.    Lab Results:  Data Reviewed: I have personally reviewed following labs and imaging studies  CBC: Recent Labs  Lab 12/16/18 1357  WBC 4.9  NEUTROABS 2.9  HGB 12.0  HCT 38.3  MCV 87.8  PLT 741    Basic Metabolic Panel:  Recent Labs  Lab 12/16/18 1357 12/17/18 0519  NA 139 140  K 3.6 3.6  CL 104 107  CO2 27 26  GLUCOSE 93 89  BUN 17 20  CREATININE 0.64 0.86  CALCIUM 9.1 8.5*    GFR: Estimated Creatinine Clearance: 27.8 mL/min (by C-G formula based on SCr of 0.86 mg/dL).  Cardiac Enzymes: Recent Labs  Lab 12/16/18 1902  CKTOTAL 95      Radiology Studies: Dg Chest 2 View  Result Date: 12/16/2018 CLINICAL DATA:  Two recent falls. EXAM: CHEST - 2 VIEW COMPARISON:  05/23/2018 and 11/27/2017. Thoracic spine dated 07/22/2012. FINDINGS: The cardiac silhouette remains  borderline enlarged. Clear lungs with normal vascularity. Mild scoliosis. Approximately 35% midthoracic spine vertebral superior endplate compression deformity with no visible acute fracture lines. This appears grossly unchanged since 11/27/2017. There is also an old, healed, displaced fracture of the sternum. No acute fractures are seen. IMPRESSION: No acute abnormality. Electronically Signed   By: Claudie Revering M.D.   On: 12/16/2018 14:24   Ct Head Wo Contrast  Result Date: 12/16/2018 CLINICAL DATA:  Mental status change. EXAM: CT HEAD WITHOUT CONTRAST TECHNIQUE: Contiguous axial images were obtained from the base of the skull through the vertex without intravenous contrast. COMPARISON:  05/23/2018 FINDINGS: Brain: Stable age related cerebral atrophy, ventriculomegaly and periventricular white matter disease. Remote left caudate and right occipital infarcts with encephalomalacia. No extra-axial fluid collections are identified. No CT findings for acute hemispheric infarction or intracranial hemorrhage. No mass lesions. The brainstem and cerebellum are normal. Vascular: Stable vascular calcifications. No definite aneurysm or hyperdense vessels. Skull: No skull fracture or bone lesions. Sinuses/Orbits: The paranasal sinuses and mastoid air cells are clear. Other: No scalp lesions or hematoma. IMPRESSION: 1. Stable age related cerebral atrophy, ventriculomegaly and advanced periventricular white matter disease. 2. Remote infarcts. 3. No acute intracranial findings. Electronically Signed   By: Marijo Sanes M.D.   On: 12/16/2018 15:22   Ct Lumbar Spine Wo Contrast  Result Date: 12/16/2018 CLINICAL DATA:  Left hip pain.  Fell twice over the past weekend. EXAM: CT LUMBAR SPINE WITHOUT CONTRAST TECHNIQUE: Multidetector CT imaging of the lumbar spine was performed without intravenous contrast administration. Multiplanar CT image reconstructions were also generated. COMPARISON:  Lumbar radiographs 10/19/2018  FINDINGS: Segmentation: There are five lumbar type vertebral bodies. The last full intervertebral disc space is labeled L5-S1. This correlates with the lumbar radiographs. Alignment: The overall alignment is maintained. There is degenerative anterolisthesis of L3 and L4 due to facet disease. There is also a nondisplaced pars interarticularis fracture on the right at L3. Vertebrae: Significant superior endplate compression fracture of L3. This appears relatively stable when compared to prior radiographs from November 2019. There is mild retropulsion involving the posterosuperior aspect of the vertebral body but the spinal canal is generous in is no significant spinal canal compromise. There is also a pars defect on the right at L3. The pedicles are intact. Advanced osteoporosis. Paraspinal and other soft tissues: No significant paraspinal or retroperitoneal findings. Advanced calcifications involving the aorta. The visualized sacrum is intact. Disc levels: The spinal canal is generous. No significant spinal stenosis. There are broad-based bulging uncovered discs at L4-5 and L5-S1 due to the anterolisthesis but no significant spinal or foraminal stenosis. IMPRESSION: 1. Stable appearing L3 compression fracture. Mild retropulsion but no canal compromise. 2. Small nondisplaced pars fracture on the right at L3. 3. Degenerative/non isthmic spondylolisthesis of L3 and L4. 4. Very generous spinal canal without findings for significant  spinal or foraminal stenosis. 5. Osteoporosis. Electronically Signed   By: Marijo Sanes M.D.   On: 12/16/2018 15:39   Ct Hip Left Wo Contrast  Result Date: 12/16/2018 CLINICAL DATA:  Left hip pain. The patient suffered 2 falls 5-6 days ago. Initial encounter. EXAM: CT OF THE LEFT HIP WITHOUT CONTRAST TECHNIQUE: Multidetector CT imaging of the left hip was performed according to the standard protocol. Multiplanar CT image reconstructions were also generated. COMPARISON:  Plain films left  hip earlier today. FINDINGS: Bones/Joint/Cartilage The left hip is located. No fracture is identified. No focal bony lesion is seen. There is no avascular necrosis of the left femoral head. Only minimal left hip joint space narrowing is identified. Ligaments Suboptimally assessed by CT. Muscles and Tendons Intact. Soft tissues Imaged intrapelvic contents demonstrate no acute abnormality. IMPRESSION: Negative for fracture or other acute abnormality. Mild left hip osteoarthritis. Electronically Signed   By: Inge Rise M.D.   On: 12/16/2018 15:18   Dg Hip Unilat With Pelvis 2-3 Views Left  Result Date: 12/16/2018 CLINICAL DATA:  85 year old female with a history of fall and left hip pain EXAM: DG HIP (WITH OR WITHOUT PELVIS) 2-3V LEFT COMPARISON:  None. FINDINGS: Osteopenia. Bony pelvic ring intact with no acute displaced fracture. Bilateral hips projects normally over the acetabula. Degenerative changes of the hips.  No hip fracture identified. Degenerative changes of the lumbar spine. IMPRESSION: No acute bony abnormality. Osteopenia. Electronically Signed   By: Corrie Mckusick D.O.   On: 12/16/2018 12:49     Medications:  Scheduled: . aspirin EC  81 mg Oral Daily  . Carbidopa-Levodopa ER  1 tablet Oral QID  . docusate sodium  100 mg Oral BID  . enoxaparin (LOVENOX) injection  30 mg Subcutaneous Q24H  . feeding supplement (ENSURE ENLIVE)  237 mL Oral Q24H  . levothyroxine  88 mcg Oral Daily  . mirabegron ER  25 mg Oral Daily  . sodium chloride flush  3 mL Intravenous Q12H  . vitamin B-12  1,000 mcg Oral Daily   Continuous: . sodium chloride     BVQ:XIHWTU chloride, acetaminophen **OR** acetaminophen, methocarbamol, ondansetron **OR** ondansetron (ZOFRAN) IV, sodium chloride flush, traMADol    Assessment/Plan:  Pars fracture involving L3alongwith compression fracture involving L3 which is stable compared to previous films ED provider discussed with neurosurgery.  Dr. Sherwood Gambler  recommended a lumbar brace.  Follow-up with him in 2 months time.  Patient was given pain medications and muscle relaxants.  Symptoms have improved.  PT and OT has evaluated patient and recommend skilled nursing facility.  Social worker consulted.  Waiting on bed availability and insurance authorization.    Left hip pain Fortunately no fractures identified.  She did have restricted range of motion of the left lower extremity at the time of admission.    Continues to improve gradually.  Falls at home Patient admits to having balance issues. She does have Parkinson's which could be contributing to her falls. She does have a history of vitamin B12 deficiency for which she does take supplements. No infectious etiology identified. UA is unremarkable.  CK was normal.  Patient seen by PT and OT and short-term rehab at skilled nursing facility is recommended.  History of Parkinson's disease Continue with her home medications.  Stable.  Hypothyroidism Continue with home medications  DVT Prophylaxis: Lovenox    Code Status: Full code Family Communication: Discussed with the patient Disposition Plan: Management as outlined above.  Waiting on SNF bed and insurance authorization.  LOS: 0 days   Marylouise Mallet [employer] on [email or web address]  12/18/2018, 11:37 AM

## 2018-12-18 NOTE — Progress Notes (Signed)
Physical Therapy Treatment Patient Details Name: Elizabeth [name] MRN: [id number] DOB: 04/06/1931 Today's Date: 12/18/2018    History of Present Illness Elizabeth [name] is a 85 y.o. female with a past medical history of Parkinson's disease, hypothyroidism, history of vitamin B12 deficiency, who lives at home    PT Comments    Pt progressing toward PT goals, increased amb tolerance however continues to require assist with maneuvering RW and for balance/safety  Follow Up Recommendations  SNF;Supervision/Assistance - 24 hour     Equipment Recommendations  None recommended by PT    Recommendations for Other Services       Precautions / Restrictions Precautions Precautions: Fall;Back Required Braces or Orthoses: Spinal Brace Spinal Brace: Lumbar corset Restrictions Weight Bearing Restrictions: No Other Position/Activity Restrictions: dependent in donning  LSO    Mobility  Bed Mobility Overal bed mobility: Needs Assistance Bed Mobility: Supine to Sit;Sit to Supine     Supine to sit: Min guard Sit to supine: Min guard   General bed mobility comments: for safety, cues for back precautions   Transfers Overall transfer level: Needs assistance Equipment used: Rolling walker (2 wheeled) Transfers: Sit to/from Stand Sit to Stand: Min assist         General transfer comment: 2 attempts to come to stand, min assist to safely rise and transition to standing   Ambulation/Gait Ambulation/Gait assistance: Min guard;Min assist Gait Distance (Feet): (hallway ambulation) Assistive device: Rolling walker (2 wheeled) Gait Pattern/deviations: Step-through pattern;Trunk flexed;Decreased stride length Gait velocity: decr   General Gait Details: cues for posture and upward gaze, intermittent assist to maneuver RW over smooth  level surface with minimal obstacles   Stairs             Wheelchair Mobility    Modified Rankin (Stroke Patients Only)       Balance Overall balance  assessment: Needs assistance;History of Falls Sitting-balance support: No upper extremity supported;Feet supported Sitting balance-Leahy Scale: Fair Sitting balance - Comments: loss of balance with wt shifting   Standing balance support: Bilateral upper extremity supported;No upper extremity supported;During functional activity Standing balance-Leahy Scale: Poor Standing balance comment: reliant on UEs                             Cognition Arousal/Alertness: Awake/alert Behavior During Therapy: WFL for tasks assessed/performed Overall Cognitive Status: Impaired/Different from baseline Area of Impairment: Safety/judgement;Problem solving;Memory                     Memory: Decreased short-term memory   Safety/Judgement: Decreased awareness of deficits   Problem Solving: Difficulty sequencing;Requires verbal cues General Comments: pt does not recall amb wtih PT yesterday; she wants to demo how she gets in bed athome and she proceeds to put her feet on the  pillow      Exercises      General Comments        Pertinent Vitals/Pain Pain Assessment: No/denies pain    Home Living                      Prior Function            PT Goals (current goals can now be found in the care plan section) Acute Rehab PT Goals Patient Stated Goal: home, reluctantly agrees to rehab PT Goal Formulation: With patient Time For Goal Achievement: 12/31/18 Potential to Achieve Goals: Good Progress towards PT goals: Progressing toward  goals    Frequency    Min 3X/week      PT Plan Current plan remains appropriate    Co-evaluation              AM-PAC PT "6 Clicks" Mobility   Outcome Measure  Help needed turning from your back to your side while in a flat bed without using bedrails?: A Little Help needed moving from lying on your back to sitting on the side of a flat bed without using bedrails?: A Little Help needed moving to and from a bed to a chair  (including a wheelchair)?: A Little Help needed standing up from a chair using your arms (e.g., wheelchair or bedside chair)?: A Little Help needed to walk in hospital room?: A Little Help needed climbing 3-5 steps with a railing? : A Lot 6 Click Score: 17    End of Session   Activity Tolerance: Patient tolerated treatment well Patient left: in bed;with call bell/phone within reach;with bed alarm set Nurse Communication: Mobility status PT Visit Diagnosis: Unsteadiness on feet (R26.81);Muscle weakness (generalized) (M62.81);History of falling (Z91.81)     Time: 8592-9244 PT Time Calculation (min) (ACUTE ONLY): 31 min  Charges:  $Gait Training: 23-37 mins                     Kenyon Ana, PT  Pager: [phone] Acute Rehab Dept [facility]): [phone]   12/18/2018    [facility] 12/18/2018, 5:02 PM

## 2018-12-19 NOTE — Progress Notes (Signed)
[facility] PROGRESS NOTE  Elizabeth [name] [name] QQV:[id number] DOB: 08-Apr-1931 DOA: 12/16/2018  PCP: Binnie Rail, MD  Brief History/Interval Summary: 85 y.o.femalewith a past medical history of Parkinson's disease, hypothyroidism, history of vitamin B12 deficiency, who lives at home mostly by herself. She has aides who come to assist her during some part of the day. Patient presented to her primary care provider this morning with complaints of falls over the weekend. She had 2 falls as a result of losing her balance. She denies any syncopal episodes. Both times she fell on her buttocks and back. She had pain in her lower back as well as the left hip. After the second fall she could not get up from the floor and had to wait till her aide came in the next day. Due to persistent pain she went to her primary care provider. Due to concern for fractures they recommended patient be sent to the emergency department. Patient has not been able to ambulate since her falls. She mentions that the pain is 6-7 out of 10 in intensity both in her back and left hip. She denies any shortness of breath. No headaches. Denies any head injuries. No fever no chills. No discomfort with urinating.  In the emergency department patient underwent work-up which did not reveal any fracture involving the left hip. Pars fracture was identified involving the L3. A compression fracture was noted involving L3 but this was thought to be stable compared to previous films. No neurological deficits were noted. Due to her significant pain and discomfort patient could not ambulate. She lives by herself at home. She was brought into the hospital for safety purposes and physical and Occupational Therapy and pain control.  Consultations:  Dr. Sherwood Gambler with neurosurgery over the phone  Procedures:  None   Antibiotics: None  Subjective/Interval History: Patient asking how long her pain is going to last.   She was reassured.  She was told that it will take potentially up to 2 weeks for the pain to completely resolve.  She denies any new complaints at this time.  Overall she feels better.     Objective:  Vital Signs  Vitals:   12/18/18 0600 12/18/18 1338 12/18/18 2140 12/19/18 0537  BP: (!) 157/71 (!) 130/55 (!) 165/66 (!) 157/88  Pulse: 71 74 80 83  Resp:  16 16 18   Temp:  98.3 F (36.8 C) 98.2 F (36.8 C) (!) 97.4 F (36.3 C)  TempSrc:  Oral Oral Oral  SpO2:  99% 94% 97%  Weight:      Height:        Intake/Output Summary (Last 24 hours) at 12/19/2018 1024 Last data filed at 12/19/2018 0950 Gross per 24 hour  Intake 720 ml  Output 750 ml  Net -30 ml   Filed Weights   12/16/18 2030  Weight: 36.8 kg   General appearance: Awake alert.  In no distress Resp: Clear to auscultation bilaterally.  Normal effort Cardio: S1-S2 is normal regular.  No S3-S4.  No rubs murmurs or bruit GI: Abdomen is soft.  Nontender nondistended.  Bowel sounds are present normal.  No masses organomegaly Extremities: Improved range of motion of the left lower extremity. Neurologic: Alert and oriented x3.  No focal neurological deficits.    Lab Results:  Data Reviewed: I have personally reviewed following labs and imaging studies  CBC: Recent Labs  Lab 12/16/18 1357  WBC 4.9  NEUTROABS 2.9  HGB 12.0  HCT 38.3  MCV 87.8  PLT 614    Basic Metabolic Panel: Recent Labs  Lab 12/16/18 1357 12/17/18 0519  NA 139 140  K 3.6 3.6  CL 104 107  CO2 27 26  GLUCOSE 93 89  BUN 17 20  CREATININE 0.64 0.86  CALCIUM 9.1 8.5*    GFR: Estimated Creatinine Clearance: 27.8 mL/min (by C-G formula based on SCr of 0.86 mg/dL).  Cardiac Enzymes: Recent Labs  Lab 12/16/18 1902  CKTOTAL 95      Radiology Studies: No results found.   Medications:  Scheduled: . aspirin EC  81 mg Oral Daily  . Carbidopa-Levodopa ER  1 tablet Oral QID  . docusate sodium  100 mg Oral BID  . enoxaparin (LOVENOX)  injection  30 mg Subcutaneous Q24H  . feeding supplement (ENSURE ENLIVE)  237 mL Oral Q24H  . levothyroxine  88 mcg Oral Daily  . mirabegron ER  25 mg Oral Daily  . sodium chloride flush  3 mL Intravenous Q12H  . vitamin B-12  1,000 mcg Oral Daily   Continuous: . sodium chloride     ERX:VQMGQQ chloride, acetaminophen **OR** acetaminophen, methocarbamol, ondansetron **OR** ondansetron (ZOFRAN) IV, sodium chloride flush, traMADol    Assessment/Plan:  Pars fracture involving L3alongwith compression fracture involving L3 which is stable compared to previous films ED provider discussed with neurosurgery.  Dr. Sherwood Gambler recommended a lumbar brace.  Follow-up with him in 2 months time.  Patient was given pain medications and muscle relaxants. PT and OT has evaluated patient and recommend skilled nursing facility.  Social worker consulted.  Waiting on bed availability and insurance authorization.    Symptoms continue to improve.  Left hip pain Fortunately no fractures identified.  She did have restricted range of motion of the left lower extremity at the time of admission.    Continues to improve gradually.  Falls at home Patient admits to having balance issues. She does have Parkinson's which could be contributing to her falls. She does have a history of vitamin B12 deficiency for which she does take supplements. No infectious etiology identified. UA is unremarkable.  CK was normal.  Patient seen by PT and OT and short-term rehab at skilled nursing facility is recommended.  History of Parkinson's disease Continue with her home medications.  Stable.  Hypothyroidism Continue with home medications  DVT Prophylaxis: Lovenox    Code Status: Full code Family Communication: Discussed with the patient Disposition Plan: Management as outlined above.  Waiting on SNF bed and insurance authorization.    LOS: 0 days   Elizabeth [name] [name] [employer] on  [email or web address]  12/19/2018, 10:24 AM

## 2018-12-19 NOTE — Plan of Care (Signed)
Pt stable with no needs. No changes to note. No s/s of distress. Pt denies pain. Pt up in room with walker and one person assist. Rn will continue to monitor.

## 2018-12-20 ENCOUNTER — Other Ambulatory Visit: Payer: Self-pay | Admitting: Internal Medicine

## 2018-12-20 DIAGNOSIS — M4316 Spondylolisthesis, lumbar region: Secondary | ICD-10-CM | POA: Diagnosis not present

## 2018-12-20 DIAGNOSIS — N3281 Overactive bladder: Secondary | ICD-10-CM | POA: Diagnosis not present

## 2018-12-20 DIAGNOSIS — M8580 Other specified disorders of bone density and structure, unspecified site: Secondary | ICD-10-CM | POA: Diagnosis not present

## 2018-12-20 DIAGNOSIS — S32030D Wedge compression fracture of third lumbar vertebra, subsequent encounter for fracture with routine healing: Secondary | ICD-10-CM | POA: Diagnosis not present

## 2018-12-20 DIAGNOSIS — E034 Atrophy of thyroid (acquired): Secondary | ICD-10-CM | POA: Diagnosis not present

## 2018-12-20 DIAGNOSIS — Z9181 History of falling: Secondary | ICD-10-CM | POA: Diagnosis not present

## 2018-12-20 DIAGNOSIS — R2681 Unsteadiness on feet: Secondary | ICD-10-CM | POA: Diagnosis not present

## 2018-12-20 DIAGNOSIS — M25552 Pain in left hip: Secondary | ICD-10-CM | POA: Diagnosis not present

## 2018-12-20 DIAGNOSIS — G9389 Other specified disorders of brain: Secondary | ICD-10-CM | POA: Diagnosis not present

## 2018-12-20 DIAGNOSIS — Z8249 Family history of ischemic heart disease and other diseases of the circulatory system: Secondary | ICD-10-CM | POA: Diagnosis not present

## 2018-12-20 DIAGNOSIS — E538 Deficiency of other specified B group vitamins: Secondary | ICD-10-CM | POA: Diagnosis not present

## 2018-12-20 DIAGNOSIS — G20 Parkinson's disease: Secondary | ICD-10-CM | POA: Diagnosis not present

## 2018-12-20 DIAGNOSIS — E785 Hyperlipidemia, unspecified: Secondary | ICD-10-CM | POA: Diagnosis not present

## 2018-12-20 DIAGNOSIS — I4891 Unspecified atrial fibrillation: Secondary | ICD-10-CM | POA: Diagnosis not present

## 2018-12-20 DIAGNOSIS — Z7982 Long term (current) use of aspirin: Secondary | ICD-10-CM | POA: Diagnosis not present

## 2018-12-20 DIAGNOSIS — M4856XA Collapsed vertebra, not elsewhere classified, lumbar region, initial encounter for fracture: Secondary | ICD-10-CM | POA: Diagnosis not present

## 2018-12-20 DIAGNOSIS — R1312 Dysphagia, oropharyngeal phase: Secondary | ICD-10-CM | POA: Diagnosis not present

## 2018-12-20 DIAGNOSIS — Z8673 Personal history of transient ischemic attack (TIA), and cerebral infarction without residual deficits: Secondary | ICD-10-CM | POA: Diagnosis not present

## 2018-12-20 DIAGNOSIS — M1612 Unilateral primary osteoarthritis, left hip: Secondary | ICD-10-CM | POA: Diagnosis not present

## 2018-12-20 DIAGNOSIS — M4856XD Collapsed vertebra, not elsewhere classified, lumbar region, subsequent encounter for fracture with routine healing: Secondary | ICD-10-CM | POA: Diagnosis not present

## 2018-12-20 DIAGNOSIS — N3090 Cystitis, unspecified without hematuria: Secondary | ICD-10-CM | POA: Diagnosis not present

## 2018-12-20 DIAGNOSIS — W19XXXD Unspecified fall, subsequent encounter: Secondary | ICD-10-CM | POA: Diagnosis not present

## 2018-12-20 DIAGNOSIS — M4306 Spondylolysis, lumbar region: Secondary | ICD-10-CM | POA: Diagnosis not present

## 2018-12-20 DIAGNOSIS — A491 Streptococcal infection, unspecified site: Secondary | ICD-10-CM | POA: Diagnosis not present

## 2018-12-20 DIAGNOSIS — K589 Irritable bowel syndrome without diarrhea: Secondary | ICD-10-CM | POA: Diagnosis not present

## 2018-12-20 DIAGNOSIS — R488 Other symbolic dysfunctions: Secondary | ICD-10-CM | POA: Diagnosis not present

## 2018-12-20 DIAGNOSIS — Z79899 Other long term (current) drug therapy: Secondary | ICD-10-CM | POA: Diagnosis not present

## 2018-12-20 DIAGNOSIS — M810 Age-related osteoporosis without current pathological fracture: Secondary | ICD-10-CM | POA: Diagnosis not present

## 2018-12-20 DIAGNOSIS — I69828 Other speech and language deficits following other cerebrovascular disease: Secondary | ICD-10-CM | POA: Diagnosis not present

## 2018-12-20 DIAGNOSIS — M6281 Muscle weakness (generalized): Secondary | ICD-10-CM | POA: Diagnosis not present

## 2018-12-20 DIAGNOSIS — E039 Hypothyroidism, unspecified: Secondary | ICD-10-CM | POA: Diagnosis not present

## 2018-12-20 DIAGNOSIS — G2581 Restless legs syndrome: Secondary | ICD-10-CM | POA: Diagnosis not present

## 2018-12-20 MED ORDER — TRAMADOL HCL 50 MG PO TABS: 50.0000 mg | ORAL_TABLET | Freq: Four times a day (QID) | ORAL | 0 refills | Status: DC | PRN

## 2018-12-20 NOTE — Clinical Social Work Placement (Signed)
D/C Summary sent.  Nurse call report to:[phone] Room: 107  CLINICAL SOCIAL WORK PLACEMENT  NOTE  Date:  12/20/2018  Patient Details  Name: Elizabeth [name] MRN: [id number] Date of Birth: Sep 14, 1931  Clinical Social Work is seeking post-discharge placement for this patient at the [facility] level of care (*CSW will initial, date and re-position this form in  chart as items are completed):  Yes   Patient/family provided with [facility] Work Department's list of facilities offering this level of care within the geographic area requested by the patient (or if unable, by the patient's family).  Yes   Patient/family informed of their freedom to choose among providers that offer the needed level of care, that participate in Medicare, Medicaid or managed care program needed by the patient, have an available bed and are willing to accept the patient.  Yes   Patient/family informed of 's ownership interest in [facility] and [facility], as well as of the fact that they are under no obligation to receive care at these facilities.  PASRR submitted to EDS on       PASRR number received on       Existing PASRR number confirmed on 12/17/18     FL2 transmitted to all facilities in geographic area requested by pt/family on       FL2 transmitted to all facilities within larger geographic area on 12/17/18     Patient informed that his/her managed care company has contracts with or will negotiate with certain facilities, including the following:  [facility]         Patient/family informed of bed offers received.  Patient chooses bed at [facility]     Physician recommends and patient chooses bed at      Patient to be transferred to [facility] on 12/20/18.  Patient to be transferred to facility by [facility]     Patient family notified on 12/20/18 of transfer.  Name of family member  notified:  Patient to notify her cousin      PHYSICIAN Please prepare priority discharge summary, including medications     Additional Comment:    _______________________________________________ Lia Hopping, LCSW 12/20/2018, 12:36 PM

## 2018-12-20 NOTE — Discharge Instructions (Signed)
You will need to wear the brace whenever she is up and about, but should not wear it in bed.  You will need to continue to use the brace until she returns for follow-up with Dr. Sherwood Gambler in 2 months.    How to Use a Back Brace  A back brace is a form-fitting device that wraps around your trunk to support your lower back, abdomen, and hips. You may need to wear a back brace to relieve back pain or to correct a medical condition related to the back, such as abnormal curvature of the spine (scoliosis). A back brace can maintain or correct the shape of the spine and prevent a spinal problem from getting worse. A back brace can also take pressure off the layers of tissue (disks) between the bones of the spine (vertebrae). You may need a back brace to keep your back and spine in place while you heal from an injury or recover from surgery. Back braces can be either plastic (rigid brace) or soft elastic (dynamic brace). A rigid brace usually covers both the front and back of the entire upper body. A soft brace may cover only the lower back and abdomen and may fasten with self-adhesive elastic straps. Your health care provider will recommend the proper brace for your needs and medical condition. What are the risks?  A back brace may not help if you do not wear it as directed by your health care provider. Be sure to wear the brace exactly as instructed in order to prevent further back problems.  Wearing the brace may be uncomfortable at first. You may have trouble sleeping with it on. It may also be hard for you to do certain activities while wearing the brace. How to use a back brace Different types of braces will have different instructions for use. Follow instructions from your health care provider about:  How to put on the brace.  When and how often to wear the brace. In some cases, braces may need to be worn for long stretches of time. For example, a brace may need to be worn for 16-23 hours a day when  used for scoliosis.  How to take off the brace.  Any safety tips you should follow when wearing the brace. This may include: ? Moving carefully while wearing the brace. The brace restricts your movement and could lead to additional injuries. ? Using a cane or walker for support if you feel unsteady. ? Sitting in high, firm chairs. It may be difficult to stand up from low, soft chairs. How to care for a back brace  Do not let the back brace get wet. Typically, you will remove the brace for bathing and then put it back on afterward.  If you have a rigid brace, be sure to store it in a safe place when you are not wearing it. This will help to prevent damage.  Clean or wash the back brace with mild soap and water as told by your health care provider. Contact a health care provider if:  Your brace gets damaged.  You have pain or discomfort when wearing the back brace.  Your back pain is getting worse or is not improving over time. This information is not intended to replace advice given to you by your health care provider. Make sure you discuss any questions you have with your health care provider. Document Released: 10/30/2011 Document Revised: 12/06/2015 Document Reviewed: 07/04/2015 Elsevier Interactive Patient Education  2019 [employer].

## 2018-12-20 NOTE — Progress Notes (Signed)
Occupational Therapy Treatment Patient Details Name: Elizabeth [name] MRN: [id number] DOB: 13-Oct-1931 Today's Date: 12/20/2018    History of present illness Elizabeth [name] is a 85 y.o. female with a past medical history of Parkinson's disease, hypothyroidism, history of vitamin B12 deficiency, who lives at home   OT comments  Pt agreeable to SNF.  Educated in back precautions with ADL activity. Pt will need further education.     Follow Up Recommendations  SNF    Equipment Recommendations  None recommended by OT    Recommendations for Other Services      Precautions / Restrictions Precautions Precautions: Fall;Back Required Braces or Orthoses: Spinal Brace Spinal Brace: Lumbar corset Restrictions Weight Bearing Restrictions: No Other Position/Activity Restrictions: adjusted LSO, pt able to assist in donning LSO after it was lengthened       Mobility Bed Mobility Overal bed mobility: Needs Assistance Bed Mobility: Sit to Supine       Sit to supine: Min assist   General bed mobility comments: assist to bring LEs into the bed  Transfers Overall transfer level: Needs assistance Equipment used: Rolling walker (2 wheeled) Transfers: Sit to/from [facility] Sit to Stand: Min guard Stand pivot transfers: Min guard       General transfer comment: cues for hand placement and safety    Balance     Sitting balance-Leahy Scale: Good     Standing balance support: Single extremity supported;During functional activity Standing balance-Leahy Scale: Poor Standing balance comment: pt reliant on at least single UE support for balance                            ADL either performed or assessed with clinical judgement   ADL Overall ADL's : Needs assistance/impaired     Grooming: Set up;Minimal assistance;Standing                   Toilet Transfer: Minimal assistance;RW;BSC   Toileting- Clothing Manipulation and Hygiene: Minimal  assistance;Moderate assistance;Sit to/from stand;Cueing for sequencing;Cueing for safety         General ADL Comments: Pts brace very cumbersome. Not able to attach in front.  Pt declined OT further trying to fit brace stating it doesnt help.  Ortho tech paged for A in fitting the brace.     Vision Patient Visual Report: No change from baseline            Cognition Arousal/Alertness: Awake/alert Behavior During Therapy: WFL for tasks assessed/performed Overall Cognitive Status: Within Functional Limits for tasks assessed                               Problem Solving: Difficulty sequencing;Requires verbal cues                     Pertinent Vitals/ Pain       Pain Assessment: Faces Faces Pain Scale: Hurts a little bit Pain Location: back  Pain Descriptors / Indicators: Discomfort Pain Intervention(s): Repositioned         Frequency  Min 2X/week        Progress Toward Goals  OT Goals(current goals can now be found in the care plan section)     Acute Rehab OT Goals Patient Stated Goal: home, reluctantly agrees to rehab  Plan Discharge plan remains appropriate       AM-PAC OT "6 Clicks" Daily Activity  Outcome Measure   Help from another person eating meals?: A Little Help from another person taking care of personal grooming?: A Little Help from another person toileting, which includes using toliet, bedpan, or urinal?: A Little Help from another person bathing (including washing, rinsing, drying)?: A Little Help from another person to put on and taking off regular upper body clothing?: A Little Help from another person to put on and taking off regular lower body clothing?: A Lot 6 Click Score: 17    End of Session Equipment Utilized During Treatment: Rolling walker;Back brace(back brace not fitting this OT session.)  OT Visit Diagnosis: Unsteadiness on feet (R26.81);Muscle weakness (generalized) (M62.81);Other abnormalities of gait and  mobility (R26.89);History of falling (Z91.81);Repeated falls (R29.6)   Activity Tolerance Patient tolerated treatment well   Patient Left in chair;with call bell/phone within reach;with chair alarm set   Nurse Communication Mobility status        Time: 7078-6754 OT Time Calculation (min): 17 min  Charges: OT General Charges $OT Visit: 1 Visit OT Treatments $Self Care/Home Management : 8-22 mins  Kari Baars, [place] [phone] Office- [phone]      Ulices Maack, Elizabeth [name] 12/20/2018, 12:49 PM

## 2018-12-20 NOTE — Progress Notes (Signed)
Report called to [facility], Therapist, sports at [facility].  Pt stable; awaiting transfer to [facility] via personal aid.

## 2018-12-20 NOTE — Discharge Summary (Signed)
[facility]  Physician Discharge Summary   Patient ID: Elizabeth [name] MRN: [id number] DOB/AGE: 1931/02/26 85 y.o.  Admit date: 12/16/2018 Discharge date: 12/20/2018  PCP: Binnie Rail, MD  DISCHARGE DIAGNOSES:  Pars fracture involving L3 Old L3 compression fracture Acute back pain due to the above Parkinson's disease Hypothyroidism  RECOMMENDATIONS FOR OUTPATIENT FOLLOW UP: 1. Patient will need to wear the lumbar brace for 2 months at the very least.  She can take it off when she is in bed. 2. She will need to follow-up with Dr. Sherwood Gambler in 2 months   DISCHARGE CONDITION: fair  Diet recommendation: Regular as tolerated  Filed Weights   12/16/18 2030  Weight: 36.8 kg    INITIAL HISTORY: 85 y.o.femalewith a past medical history of Parkinson's disease, hypothyroidism, history of vitamin B12 deficiency, who lives at home mostly by herself. She has aides who come to assist her during some part of the day. Patient presented to her primary care provider this morning with complaints of falls over the weekend. She had 2 falls as a result of losing her balance. She denies any syncopal episodes. Both times she fell on her buttocks and back. She had pain in her lower back as well as the left hip. After the second fall she could not get up from the floor and had to wait till her aide came in the next day. Due to persistent pain she went to her primary care provider. Due to concern for fractures they recommended patient be sent to the emergency department. Patient has not been able to ambulate since her falls. She mentions that the pain is 6-7 out of 10 in intensity both in her back and left hip. She denies any shortness of breath. No headaches. Denies any head injuries. No fever no chills. No discomfort with urinating.  In the emergency department patient underwent work-up which did not reveal any fracture involving the left hip. Pars fracture was identified  involving the L3. A compression fracture was noted involving L3 but this was thought to be stable compared to previous films. No neurological deficits were noted. Due to her significant pain and discomfort patient cannot ambulate. She lives by herself at home. She will need to be brought into the hospital for safety purposes and physical and Occupational Therapy and pain control.  Consultations:  Dr. Sherwood Gambler with neurosurgery over the phone  Procedures:  None   HOSPITAL COURSE:   Pars fracture involving L3alongwith compression fracture involving L3 which is stable compared to previous films ED provider discussed with neurosurgery.Dr. Sherwood Gambler recommended a lumbar brace. Follow-up with him in 2 months time. Patient was given pain medications and muscle relaxants. PT and OT has evaluated patient and recommend skilled nursing facility.  Social worker consulted.  Waiting on bed availability and insurance authorization.   Symptoms continue to improve.  Left hip pain Fortunately no fractures identified.She did have restricted range of motion of the left lower extremity at the time of admission.   Continues to improve gradually.  Falls at home Patient admits to having balance issues. She does have Parkinson's which could be contributing to her falls. She does have a history of vitamin B12 deficiency for which she does take supplements. No infectious etiology identified. UA is unremarkable.CK was normal. Patient seen by PT and OT and short-term rehab at skilled nursing facility is recommended.  History of Parkinson's disease Continue with her home medications.Stable.  Hypothyroidism Continue with home medications.  Patient remained stable.  Okay for discharge to skilled nursing facility today.    PERTINENT LABS:  The results of significant diagnostics from this hospitalization (including imaging, microbiology, ancillary and laboratory) are listed below for  reference.     Labs: Basic Metabolic Panel: Recent Labs  Lab 12/16/18 1357 12/17/18 0519  NA 139 140  K 3.6 3.6  CL 104 107  CO2 27 26  GLUCOSE 93 89  BUN 17 20  CREATININE 0.64 0.86  CALCIUM 9.1 8.5*   CBC: Recent Labs  Lab 12/16/18 1357  WBC 4.9  NEUTROABS 2.9  HGB 12.0  HCT 38.3  MCV 87.8  PLT 203   Cardiac Enzymes: Recent Labs  Lab 12/16/18 1902  CKTOTAL 95     IMAGING STUDIES Dg Chest 2 View  Result Date: 12/16/2018 CLINICAL DATA:  Two recent falls. EXAM: CHEST - 2 VIEW COMPARISON:  05/23/2018 and 11/27/2017. Thoracic spine dated 07/22/2012. FINDINGS: The cardiac silhouette remains borderline enlarged. Clear lungs with normal vascularity. Mild scoliosis. Approximately 35% midthoracic spine vertebral superior endplate compression deformity with no visible acute fracture lines. This appears grossly unchanged since 11/27/2017. There is also an old, healed, displaced fracture of the sternum. No acute fractures are seen. IMPRESSION: No acute abnormality. Electronically Signed   By: Claudie Revering M.D.   On: 12/16/2018 14:24   Ct Head Wo Contrast  Result Date: 12/16/2018 CLINICAL DATA:  Mental status change. EXAM: CT HEAD WITHOUT CONTRAST TECHNIQUE: Contiguous axial images were obtained from the base of the skull through the vertex without intravenous contrast. COMPARISON:  05/23/2018 FINDINGS: Brain: Stable age related cerebral atrophy, ventriculomegaly and periventricular white matter disease. Remote left caudate and right occipital infarcts with encephalomalacia. No extra-axial fluid collections are identified. No CT findings for acute hemispheric infarction or intracranial hemorrhage. No mass lesions. The brainstem and cerebellum are normal. Vascular: Stable vascular calcifications. No definite aneurysm or hyperdense vessels. Skull: No skull fracture or bone lesions. Sinuses/Orbits: The paranasal sinuses and mastoid air cells are clear. Other: No scalp lesions or  hematoma. IMPRESSION: 1. Stable age related cerebral atrophy, ventriculomegaly and advanced periventricular white matter disease. 2. Remote infarcts. 3. No acute intracranial findings. Electronically Signed   By: Marijo Sanes M.D.   On: 12/16/2018 15:22   Ct Lumbar Spine Wo Contrast  Result Date: 12/16/2018 CLINICAL DATA:  Left hip pain.  Fell twice over the past weekend. EXAM: CT LUMBAR SPINE WITHOUT CONTRAST TECHNIQUE: Multidetector CT imaging of the lumbar spine was performed without intravenous contrast administration. Multiplanar CT image reconstructions were also generated. COMPARISON:  Lumbar radiographs 10/19/2018 FINDINGS: Segmentation: There are five lumbar type vertebral bodies. The last full intervertebral disc space is labeled L5-S1. This correlates with the lumbar radiographs. Alignment: The overall alignment is maintained. There is degenerative anterolisthesis of L3 and L4 due to facet disease. There is also a nondisplaced pars interarticularis fracture on the right at L3. Vertebrae: Significant superior endplate compression fracture of L3. This appears relatively stable when compared to prior radiographs from November 2019. There is mild retropulsion involving the posterosuperior aspect of the vertebral body but the spinal canal is generous in is no significant spinal canal compromise. There is also a pars defect on the right at L3. The pedicles are intact. Advanced osteoporosis. Paraspinal and other soft tissues: No significant paraspinal or retroperitoneal findings. Advanced calcifications involving the aorta. The visualized sacrum is intact. Disc levels: The spinal canal is generous. No significant spinal stenosis. There are broad-based bulging uncovered discs at L4-5 and L5-S1 due  to the anterolisthesis but no significant spinal or foraminal stenosis. IMPRESSION: 1. Stable appearing L3 compression fracture. Mild retropulsion but no canal compromise. 2. Small nondisplaced pars fracture on the  right at L3. 3. Degenerative/non isthmic spondylolisthesis of L3 and L4. 4. Very generous spinal canal without findings for significant spinal or foraminal stenosis. 5. Osteoporosis. Electronically Signed   By: Marijo Sanes M.D.   On: 12/16/2018 15:39   Ct Hip Left Wo Contrast  Result Date: 12/16/2018 CLINICAL DATA:  Left hip pain. The patient suffered 2 falls 5-6 days ago. Initial encounter. EXAM: CT OF THE LEFT HIP WITHOUT CONTRAST TECHNIQUE: Multidetector CT imaging of the left hip was performed according to the standard protocol. Multiplanar CT image reconstructions were also generated. COMPARISON:  Plain films left hip earlier today. FINDINGS: Bones/Joint/Cartilage The left hip is located. No fracture is identified. No focal bony lesion is seen. There is no avascular necrosis of the left femoral head. Only minimal left hip joint space narrowing is identified. Ligaments Suboptimally assessed by CT. Muscles and Tendons Intact. Soft tissues Imaged intrapelvic contents demonstrate no acute abnormality. IMPRESSION: Negative for fracture or other acute abnormality. Mild left hip osteoarthritis. Electronically Signed   By: Inge Rise M.D.   On: 12/16/2018 15:18   Dg Hip Unilat With Pelvis 2-3 Views Left  Result Date: 12/16/2018 CLINICAL DATA:  85 year old female with a history of fall and left hip pain EXAM: DG HIP (WITH OR WITHOUT PELVIS) 2-3V LEFT COMPARISON:  None. FINDINGS: Osteopenia. Bony pelvic ring intact with no acute displaced fracture. Bilateral hips projects normally over the acetabula. Degenerative changes of the hips.  No hip fracture identified. Degenerative changes of the lumbar spine. IMPRESSION: No acute bony abnormality. Osteopenia. Electronically Signed   By: Corrie Mckusick D.O.   On: 12/16/2018 12:49    DISCHARGE EXAMINATION: Vitals:   12/19/18 0537 12/19/18 1351 12/19/18 2126 12/20/18 0534  BP: (!) 157/88 (!) 156/69 133/61 128/63  Pulse: 83 80 82 80  Resp: 18 16 14 16    Temp: (!) 97.4 F (36.3 C) (!) 97.5 F (36.4 C) 98.2 F (36.8 C) 97.6 F (36.4 C)  TempSrc: Oral Oral Oral Oral  SpO2: 97% 98% 96% 97%  Weight:      Height:       General appearance: Awake alert.  In no distress Resp: Clear to auscultation bilaterally.  Normal effort Cardio: S1-S2 is normal regular.  No S3-S4.  No rubs murmurs or bruit GI: Abdomen is soft.  Nontender nondistended.  Bowel sounds are present normal.  No masses organomegaly Extremities: No edema.  Full range of motion of lower extremities. Neurologic: Alert and oriented x3.  No focal neurological deficits.    DISPOSITION: SNF  Discharge Instructions    Call MD for:  difficulty breathing, headache or visual disturbances   Complete by:  As directed    Call MD for:  extreme fatigue   Complete by:  As directed    Call MD for:  persistant dizziness or light-headedness   Complete by:  As directed    Call MD for:  persistant nausea and vomiting   Complete by:  As directed    Call MD for:  severe uncontrolled pain   Complete by:  As directed    Call MD for:  temperature >100.4   Complete by:  As directed    Diet general   Complete by:  As directed    Discharge instructions   Complete by:  As directed  Please take your medications as prescribed.  Follow-up with your primary care provider in 1 week.  Please wear the lumbar brace as instructed.  You will also need to follow-up with Dr. Sherwood Gambler the neurosurgeon in 2 months.  You were cared for by a hospitalist during your hospital stay. If you have any questions about your discharge medications or the care you received while you were in the hospital after you are discharged, you can call the unit and asked to speak with the hospitalist on call if the hospitalist that took care of you is not available. Once you are discharged, your primary care physician will handle any further medical issues. Please note that NO REFILLS for any discharge medications will be authorized  once you are discharged, as it is imperative that you return to your primary care physician (or establish a relationship with a primary care physician if you do not have one) for your aftercare needs so that they can reassess your need for medications and monitor your lab values. If you do not have a primary care physician, you can call [phone] for a physician referral.   Increase activity slowly   Complete by:  As directed         Allergies as of 12/20/2018      Reactions   Atorvastatin Other (See Comments)   REACTION: Felt funny (only way patient could describe)   Crestor [rosuvastatin Calcium] Other (See Comments)   Leg cramps, muscle aches   Vesicare [solifenacin]    constipation      Medication List    STOP taking these medications   tiZANidine 2 MG tablet Commonly known as:  ZANAFLEX   TOVIAZ PO   triamcinolone cream 0.1 % Commonly known as:  KENALOG     TAKE these medications   aspirin 81 MG tablet Take 81 mg by mouth daily.   CALCIUM + D PO Take 1 tablet by mouth daily. 600-400   Carbidopa-Levodopa ER 36.25-145 MG Cpcr Commonly known as:  RYTARY Take 1 tablet by mouth 4 (four) times daily.   docusate sodium 100 MG capsule Commonly known as:  COLACE Take 1 capsule (100 mg total) by mouth 2 (two) times daily.   GAS-X EXTRA STRENGTH 125 MG Caps Generic drug:  Simethicone Take 125 mg by mouth daily as needed (gas).   levothyroxine 88 MCG tablet Commonly known as:  SYNTHROID, LEVOTHROID Take 1 tablet (88 mcg total) by mouth daily.   methocarbamol 500 MG tablet Commonly known as:  ROBAXIN Take 1 tablet (500 mg total) by mouth every 6 (six) hours as needed for muscle spasms.   MYRBETRIQ PO Take 1 tablet by mouth daily.   traMADol 50 MG tablet Commonly known as:  ULTRAM Take 1 tablet (50 mg total) by mouth every 6 (six) hours as needed.   vitamin B-12 1000 MCG tablet Commonly known as:  CYANOCOBALAMIN Take 1 tablet (1,000 mcg total) by mouth  daily. What changed:  how much to take         Contact information for follow-up providers    Binnie Rail, MD. Schedule an appointment as soon as possible for a visit in 1 week(s).   Specialty:  Internal Medicine Contact information: [place] [address] [phone]        Jovita Gamma, MD. Schedule an appointment as soon as possible for a visit in 2 month(s).   Specialty:  Neurosurgery Contact information: [address] [place] [address] [place] [address]  [phone]            Contact information for after-discharge care    Destination    [facility] Preferred SNF .   Service:  Skilled Nursing Contact information: [address] [place] [phone]                  TOTAL DISCHARGE TIME: 7 minutes  Robinette  [facility] Pager on [email or web address]  12/20/2018, 11:46 AM

## 2018-12-20 NOTE — Progress Notes (Signed)
Physical Therapy Treatment Patient Details Name: Elizabeth [name] MRN: [id number] DOB: [birth year] Today's Date: 12/20/2018    History of Present Illness Elizabeth [name] is a 85 y.o. female with a past medical history of Parkinson's disease, hypothyroidism, history of vitamin B12 deficiency, who lives at home    PT Comments    Pt progressing well; incr tolerance to activity today; pt able to stand, brush teeth, assist in donning LB garments after ambulation; will benefit from SNF  Follow Up Recommendations  SNF;Supervision/Assistance - 24 hour     Equipment Recommendations  None recommended by PT    Recommendations for Other Services       Precautions / Restrictions Precautions Precautions: Fall;Back Required Braces or Orthoses: Spinal Brace Spinal Brace: Lumbar corset Restrictions Weight Bearing Restrictions: No Other Position/Activity Restrictions: adjusted LSO, pt able to assist in donning LSO after it was lengthened    Mobility  Bed Mobility Overal bed mobility: Needs Assistance Bed Mobility: Sit to Supine       Sit to supine: Min assist   General bed mobility comments: assist to bring LEs into the bed  Transfers Overall transfer level: Needs assistance Equipment used: Rolling walker (2 wheeled) Transfers: Sit to/from Stand Sit to Stand: Min guard         General transfer comment: cues for hand placement and safety  Ambulation/Gait Ambulation/Gait assistance: Min Gaffer (Feet): (hallway ambulation) Assistive device: Rolling walker (2 wheeled) Gait Pattern/deviations: Step-through pattern;Trunk flexed;Decreased stride length;Narrow base of support Gait velocity: decr   General Gait Details: cues for posture and upward gaze, intermittent assist to maneuver RW over smooth  level surface with minimal obstacles   Stairs             Wheelchair Mobility    Modified Rankin (Stroke Patients Only)       Balance     Sitting  balance-Leahy Scale: Good     Standing balance support: Single extremity supported;During functional activity Standing balance-Leahy Scale: Poor Standing balance comment: pt reliant on at least single UE support for balance                             Cognition Arousal/Alertness: Awake/alert Behavior During Therapy: WFL for tasks assessed/performed Overall Cognitive Status: Within Functional Limits for tasks assessed                               Problem Solving: Difficulty sequencing;Requires verbal cues        Exercises      General Comments        Pertinent Vitals/Pain Pain Assessment: Faces Faces Pain Scale: Hurts a little bit Pain Location: back (after standing) Pain Descriptors / Indicators: Discomfort Pain Intervention(s): Limited activity within patient's tolerance;Monitored during session;Repositioned    Home Living                      Prior Function            PT Goals (current goals can now be found in the care plan section) Acute Rehab PT Goals Patient Stated Goal: home, reluctantly agrees to rehab PT Goal Formulation: With patient Time For Goal Achievement: 12/31/18 Potential to Achieve Goals: Good Progress towards PT goals: Progressing toward goals    Frequency    Min 3X/week      PT Plan Current plan remains appropriate  Co-evaluation              AM-PAC PT "6 Clicks" Mobility   Outcome Measure  Help needed turning from your back to your side while in a flat bed without using bedrails?: A Little Help needed moving from lying on your back to sitting on the side of a flat bed without using bedrails?: A Little Help needed moving to and from a bed to a chair (including a wheelchair)?: A Little Help needed standing up from a chair using your arms (e.g., wheelchair or bedside chair)?: A Little Help needed to walk in hospital room?: A Little Help needed climbing 3-5 steps with a railing? : A Lot 6  Click Score: 17    End of Session Equipment Utilized During Treatment: Gait belt Activity Tolerance: Patient tolerated treatment well Patient left: in bed;with call bell/phone within reach;with nursing/sitter in room Nurse Communication: Mobility status PT Visit Diagnosis: Unsteadiness on feet (R26.81);Muscle weakness (generalized) (M62.81);History of falling (Z91.81)     Time: 5183-3582 PT Time Calculation (min) (ACUTE ONLY): 33 min  Charges:  $Gait Training: 8-22 mins $Self Care/Home Management: 8-22                     Kenyon Ana, PT  Pager: [phone] Acute Rehab Dept [facility]): [phone]   12/20/2018    [facility] 12/20/2018, 12:35 PM

## 2018-12-21 ENCOUNTER — Telehealth: Payer: Self-pay | Admitting: *Deleted

## 2018-12-21 ENCOUNTER — Encounter: Payer: Self-pay | Admitting: Internal Medicine

## 2018-12-21 ENCOUNTER — Non-Acute Institutional Stay (SKILLED_NURSING_FACILITY): Payer: Medicare Other | Admitting: Internal Medicine

## 2018-12-21 DIAGNOSIS — W19XXXD Unspecified fall, subsequent encounter: Secondary | ICD-10-CM

## 2018-12-21 DIAGNOSIS — E538 Deficiency of other specified B group vitamins: Secondary | ICD-10-CM

## 2018-12-21 DIAGNOSIS — M4306 Spondylolysis, lumbar region: Secondary | ICD-10-CM | POA: Diagnosis not present

## 2018-12-21 DIAGNOSIS — G20 Parkinson's disease: Secondary | ICD-10-CM

## 2018-12-21 DIAGNOSIS — N3281 Overactive bladder: Secondary | ICD-10-CM

## 2018-12-21 DIAGNOSIS — E034 Atrophy of thyroid (acquired): Secondary | ICD-10-CM

## 2018-12-21 DIAGNOSIS — M810 Age-related osteoporosis without current pathological fracture: Secondary | ICD-10-CM | POA: Diagnosis not present

## 2018-12-21 DIAGNOSIS — S32030D Wedge compression fracture of third lumbar vertebra, subsequent encounter for fracture with routine healing: Secondary | ICD-10-CM

## 2018-12-21 DIAGNOSIS — Z8673 Personal history of transient ischemic attack (TIA), and cerebral infarction without residual deficits: Secondary | ICD-10-CM

## 2018-12-21 NOTE — Progress Notes (Signed)
:   Location:  [place] Room Number: 160F Place of Service:  SNF (31)  Elizabeth Canan D. Sheppard Coil, MD  Patient Care Team: Binnie Rail, MD as PCP - General (Internal Medicine) Tat, Eustace Quail, DO as Consulting Physician (Neurology) Rutherford Guys, MD as Consulting Physician (Ophthalmology)  Extended Emergency Contact Information Primary Emergency Contact: Suddreth,Joey & Jesusita Oka [place] of [place] Phone: [phone] Mobile Phone: [phone] Relation: Nephew Secondary Emergency Contact: Grafton Folk [place] Phone: [phone] Relation: Friend     Allergies: Atorvastatin; Crestor [rosuvastatin calcium]; and Vesicare [solifenacin]  Chief Complaint  Patient presents with  . New Admit To SNF    Admit to [facility]    HPI: Patient is 85 y.o. female with Parkinson's disease, hypothyroidism, B12 deficiency, who lives alone at home.  Patient had 2 falls as a result of her loss of balance.  Both times she fell on her back and buttocks.  After her second fall she was unable to ambulate and was sent to the emergency department.  In the emergency department she underwent a work-up which did not reveal any fracture involving the left hip but did identify a pars fracture involving L3.  There was a compression fracture involving L3 but this was thought to be stable compared to prior films.  No neurological deficits were noted.  Due to her significant pain and discomfort patient could not ambulate.  Patient was admitted to [facility] from 1/12-27 for safety purposes and pain control.  Neurosurgery recommended a lumbar brace and OT/PT.  Patient is admitted to skilled nursing facility for OT/PT.  While at skilled nursing facility patient will be followed for Parkinson's treated with carbidopa-levodopa ER, hypothyroidism treated with replacement, and overactive bladder treated with Myrbetriq.  Past Medical History:  Diagnosis Date    . Abnormal Pap smear of vagina 09/2006   Dr Nori Riis  . Abnormality of gait 05/24/2015   PMH R occipital infarct B12 deficiency H/o polio affected left leg  In Physical Therapy to improve balance & strength through [facility]  . Arthritis 12/25/2015  . Cervicalgia 01/17/2009   MRI 03/27/16:  IMPRESSION: 1. At C3-4 there is a mild broad-based disc bulge. Moderate bilateral facet arthropathy and uncovertebral degenerative changes resulting in moderate bilateral foraminal stenosis. 2. At C5-6 there is a broad-based disc osteophyte complex. Bilateral uncovertebral degenerative changes and facet arthropathy resulting in moderate bilateral foraminal stenosis.  . Fall 09/11/2017  . History of CVA (cerebrovascular accident) 07/20/2012   Chronic R occipital infarct ; stable on CT 05/21/2012 .Dr Jannifer Franklin , Neurology   . History of TIA (transient ischemic attack) 01/26/2008  . HYPERLIPIDEMIA 01/26/2008   NMR Lipoprofile 2009: LDL 135 (1704/911), HDL 62, TG 131. LDL goal = < 100; ideally < 70. MGF MI in 49s No FH CVA  . Hypothyroidism   . Intention tremor 12/08/2013   Onset after fall 04/2012   . Irritable bowel syndrome (IBS) 01/17/2009   Dr. Unice Cobble  . Osteopenia    PMH fracture heel, wrist  . Parkinson disease ([place]) 06/25/2016   Dr Tat  . Pernicious anemia   . Post-polio syndrome    minor problems L leg  . Restless leg syndrome 12/25/2015   Dr Tat  . Subdural hygroma 09/11/2017  . Swallowing disorder 03/04/2017  . Urinary incontinence 09/07/2008   Dr McDiarmid, Urology   . Vitamin B12 deficiency   . Weakness 06/24/2017    Past Surgical  History:  Procedure Laterality Date  . BLADDER SUSPENSION  2000  . CATARACT EXTRACTION, BILATERAL     Dr. Gershon Crane  . no colonoscopy     "I never felt I needed one "  . YAG LASER APPLICATION Left    Dr. Gershon Crane    Allergies as of 12/21/2018      Reactions   Atorvastatin Other (See Comments)   REACTION: Felt funny (only way patient could describe)   Crestor  [rosuvastatin Calcium] Other (See Comments)   Leg cramps, muscle aches   Vesicare [solifenacin]    constipation      Medication List       Accurate as of December 21, 2018 10:31 AM. Always use your most recent med list.        aspirin 81 MG tablet Take 81 mg by mouth daily.   CALCIUM + D PO Take 1 tablet by mouth daily. 600-400   Carbidopa-Levodopa ER 36.25-145 MG Cpcr Commonly known as:  RYTARY Take 1 tablet by mouth 4 (four) times daily.   docusate sodium 100 MG capsule Commonly known as:  COLACE Take 1 capsule (100 mg total) by mouth 2 (two) times daily.   GAS-X EXTRA STRENGTH 125 MG Caps Generic drug:  Simethicone Take 125 mg by mouth daily as needed (gas).   levothyroxine 88 MCG tablet Commonly known as:  SYNTHROID, LEVOTHROID Take 1 tablet (88 mcg total) by mouth daily.   methocarbamol 500 MG tablet Commonly known as:  ROBAXIN Take 1 tablet (500 mg total) by mouth every 6 (six) hours as needed for muscle spasms.   MYRBETRIQ PO Take 1 tablet by mouth daily.   traMADol 50 MG tablet Commonly known as:  ULTRAM Take 1 tablet (50 mg total) by mouth every 6 (six) hours as needed (for 2 weeks).   vitamin B-12 1000 MCG tablet Commonly known as:  CYANOCOBALAMIN Take 1 tablet (1,000 mcg total) by mouth daily.       No orders of the defined types were placed in this encounter.   Immunization History  Administered Date(s) Administered  . Tdap 03/04/2017, 09/11/2017  . Zoster 01/14/2008    Social History   Tobacco Use  . Smoking status: Never Smoker  . Smokeless tobacco: Never Used  Substance Use Topics  . Alcohol use: Yes    Alcohol/week: 1.0 standard drinks    Types: 1 Glasses of wine per week    Comment: 0.6 oz per week    Family history is   Family History  Problem Relation Age of Onset  . Heart attack Maternal Grandfather        70s  . Emphysema Mother   . Hypertension Mother   . Alzheimer's disease Father   . Breast cancer Maternal  Grandmother   . Cancer Maternal Grandmother        breast  . Diabetes Neg Hx   . Stroke Neg Hx   . Parkinson's disease Neg Hx       Review of Systems  DATA OBTAINED: from patient, nurse GENERAL:  no fevers, fatigue, appetite changes SKIN: No itching, or rash EYES: No eye pain, redness, discharge EARS: No earache, tinnitus, change in hearing NOSE: No congestion, drainage or bleeding  MOUTH/THROAT: No mouth or tooth pain, No sore throat RESPIRATORY: No cough, wheezing, SOB CARDIAC: No chest pain, palpitations, lower extremity edema  GI: No abdominal pain, No N/V/D or constipation, No heartburn or reflux  GU: No dysuria, frequency or urgency, or incontinence  MUSCULOSKELETAL: No  unrelieved bone/joint pain NEUROLOGIC: No headache, dizziness or focal weakness PSYCHIATRIC: No c/o anxiety or sadness   Vitals:   12/21/18 1026  BP: (!) 148/67  Pulse: 94  Resp: 18  Temp: (!) 97.4 F (36.3 C)    SpO2 Readings from Last 1 Encounters:  12/20/18 97%   Body mass index is 15.82 kg/m.     Physical Exam  GENERAL APPEARANCE: Alert, conversant,  No acute distress.  SKIN: No diaphoresis rash HEAD: Normocephalic, atraumatic  EYES: Conjunctiva/lids clear. Pupils round, reactive. EOMs intact.  EARS: External exam WNL, canals clear. Hearing grossly normal.  NOSE: No deformity or discharge.  MOUTH/THROAT: Lips w/o lesions  RESPIRATORY: Breathing is even, unlabored. Lung sounds are clear   CARDIOVASCULAR: Heart irregular no murmurs, rubs or gallops. No peripheral edema.   GASTROINTESTINAL: Abdomen is soft, non-tender, not distended w/ normal bowel sounds. GENITOURINARY: Bladder non tender, not distended  MUSCULOSKELETAL: No abnormal joints or musculature NEUROLOGIC:  Cranial nerves 2-12 grossly intact. Moves all extremities  PSYCHIATRIC: Mood and affect appropriate to situation, no behavioral issues  Patient Active Problem List   Diagnosis Date Noted  . Malnutrition of moderate  degree 12/17/2018  . Pars defect of lumbar spine 12/16/2018  . Acute back pain 12/16/2018  . Compression fracture of L3 vertebra ([place]) 11/02/2018  . Right shoulder pain 11/02/2018  . Left groin pain 10/19/2018  . Low back pain 10/19/2018  . Dysuria 10/12/2018  . Osteoporosis 05/30/2018  . Poor posture 04/28/2018  . Physical deconditioning 04/28/2018  . Anxiety 03/02/2018  . Urinary frequency 03/02/2018  . Foot pain, bilateral 12/21/2017  . Bilateral leg edema 12/02/2017  . Overactive bladder 09/19/2017  . Post-polio syndrome   . Vitamin B12 deficiency   . Fall 09/11/2017  . Subdural hygroma 09/11/2017  . UTI (urinary tract infection) 06/24/2017  . Swallowing disorder 03/04/2017  . Parkinson disease ([place]) 06/25/2016  . Restless leg syndrome 12/25/2015  . Arthritis 12/25/2015  . Abnormality of gait 05/24/2015  . History of CVA (cerebrovascular accident) 07/20/2012  . Cervicalgia 01/17/2009  . Irritable bowel syndrome (IBS) 01/17/2009  . Urinary incontinence 09/07/2008  . Anemia 04/26/2008  . HYPERLIPIDEMIA 01/26/2008  . History of TIA (transient ischemic attack) 01/26/2008  . Hypothyroidism 01/04/2008  . Abnormal Pap smear of vagina 09/24/2006      Labs reviewed: Basic Metabolic Panel:    Component Value Date/Time   NA 140 12/17/2018 0519   NA 139 05/28/2018   K 3.6 12/17/2018 0519   CL 107 12/17/2018 0519   CO2 26 12/17/2018 0519   GLUCOSE 89 12/17/2018 0519   BUN 20 12/17/2018 0519   BUN 13 05/28/2018   CREATININE 0.86 12/17/2018 0519   CALCIUM 8.5 (L) 12/17/2018 0519   PROT 6.4 11/02/2018 1428   ALBUMIN 3.6 11/02/2018 1428   AST 28 11/02/2018 1428   ALT 15 11/02/2018 1428   ALKPHOS 127 (H) 11/02/2018 1428   BILITOT 0.8 11/02/2018 1428   GFRNONAA >60 12/17/2018 0519   GFRAA >60 12/17/2018 0519    Recent Labs    11/02/18 1428 12/16/18 1357 12/17/18 0519  NA 140 139 140  K 3.8 3.6 3.6  CL 105 104 107  CO2 28 27 26   GLUCOSE 93 93 89  BUN 18 17 20     CREATININE 0.66 0.64 0.86  CALCIUM 8.7 9.1 8.5*   Liver Function Tests: Recent Labs    03/02/18 1430 04/28/18 1521 11/02/18 1428  AST 30 23 28   ALT 3 2 15  ALKPHOS 92 101 127*  BILITOT 0.9 0.7 0.8  PROT 6.5 6.7 6.4  ALBUMIN 3.5 3.9 3.6   No results for input(s): LIPASE, AMYLASE in the last 8760 hours. No results for input(s): AMMONIA in the last 8760 hours. CBC: Recent Labs    05/23/18 1238 05/28/18 11/02/18 1428 12/16/18 1357  WBC 5.0 4.4 4.9 4.9  NEUTROABS 3.3  --  3.1 2.9  HGB 10.6* 11.3* 10.5* 12.0  HCT 31.8* 33* 31.2* 38.3  MCV 85.3  --  83.3 87.8  PLT 206 252 283.0 203   Lipid No results for input(s): CHOL, HDL, LDLCALC, TRIG in the last 8760 hours.  Cardiac Enzymes: Recent Labs    05/23/18 1238 12/16/18 1902  CKTOTAL  --  95  TROPONINI <0.03  --    BNP: No results for input(s): BNP in the last 8760 hours. No results found for: [facility] Lab Results  Component Value Date   HGBA1C 5.8 12/14/2009   Lab Results  Component Value Date   TSH 0.03 (L) 11/02/2018   Lab Results  Component Value Date   VITAMINB12 373 03/02/2018   Lab Results  Component Value Date   FOLATE 7.1 06/15/2008   Lab Results  Component Value Date   IRON 58 04/28/2018   IRON 58 04/28/2018   TIBC 281 09/18/2016   FERRITIN 34.4 04/28/2018    Imaging and Procedures obtained prior to SNF admission: Dg Chest 2 View  Result Date: 12/16/2018 CLINICAL DATA:  Two recent falls. EXAM: CHEST - 2 VIEW COMPARISON:  05/23/2018 and 11/27/2017. Thoracic spine dated 07/22/2012. FINDINGS: The cardiac silhouette remains borderline enlarged. Clear lungs with normal vascularity. Mild scoliosis. Approximately 35% midthoracic spine vertebral superior endplate compression deformity with no visible acute fracture lines. This appears grossly unchanged since 11/27/2017. There is also an old, healed, displaced fracture of the sternum. No acute fractures are seen. IMPRESSION: No acute abnormality.  Electronically Signed   By: Claudie Revering M.D.   On: 12/16/2018 14:24   Ct Head Wo Contrast  Result Date: 12/16/2018 CLINICAL DATA:  Mental status change. EXAM: CT HEAD WITHOUT CONTRAST TECHNIQUE: Contiguous axial images were obtained from the base of the skull through the vertex without intravenous contrast. COMPARISON:  05/23/2018 FINDINGS: Brain: Stable age related cerebral atrophy, ventriculomegaly and periventricular white matter disease. Remote left caudate and right occipital infarcts with encephalomalacia. No extra-axial fluid collections are identified. No CT findings for acute hemispheric infarction or intracranial hemorrhage. No mass lesions. The brainstem and cerebellum are normal. Vascular: Stable vascular calcifications. No definite aneurysm or hyperdense vessels. Skull: No skull fracture or bone lesions. Sinuses/Orbits: The paranasal sinuses and mastoid air cells are clear. Other: No scalp lesions or hematoma. IMPRESSION: 1. Stable age related cerebral atrophy, ventriculomegaly and advanced periventricular white matter disease. 2. Remote infarcts. 3. No acute intracranial findings. Electronically Signed   By: Marijo Sanes M.D.   On: 12/16/2018 15:22   Ct Lumbar Spine Wo Contrast  Result Date: 12/16/2018 CLINICAL DATA:  Left hip pain.  Fell twice over the past weekend. EXAM: CT LUMBAR SPINE WITHOUT CONTRAST TECHNIQUE: Multidetector CT imaging of the lumbar spine was performed without intravenous contrast administration. Multiplanar CT image reconstructions were also generated. COMPARISON:  Lumbar radiographs 10/19/2018 FINDINGS: Segmentation: There are five lumbar type vertebral bodies. The last full intervertebral disc space is labeled L5-S1. This correlates with the lumbar radiographs. Alignment: The overall alignment is maintained. There is degenerative anterolisthesis of L3 and L4 due to facet disease. There is also  a nondisplaced pars interarticularis fracture on the right at L3.  Vertebrae: Significant superior endplate compression fracture of L3. This appears relatively stable when compared to prior radiographs from November 2019. There is mild retropulsion involving the posterosuperior aspect of the vertebral body but the spinal canal is generous in is no significant spinal canal compromise. There is also a pars defect on the right at L3. The pedicles are intact. Advanced osteoporosis. Paraspinal and other soft tissues: No significant paraspinal or retroperitoneal findings. Advanced calcifications involving the aorta. The visualized sacrum is intact. Disc levels: The spinal canal is generous. No significant spinal stenosis. There are broad-based bulging uncovered discs at L4-5 and L5-S1 due to the anterolisthesis but no significant spinal or foraminal stenosis. IMPRESSION: 1. Stable appearing L3 compression fracture. Mild retropulsion but no canal compromise. 2. Small nondisplaced pars fracture on the right at L3. 3. Degenerative/non isthmic spondylolisthesis of L3 and L4. 4. Very generous spinal canal without findings for significant spinal or foraminal stenosis. 5. Osteoporosis. Electronically Signed   By: Marijo Sanes M.D.   On: 12/16/2018 15:39   Ct Hip Left Wo Contrast  Result Date: 12/16/2018 CLINICAL DATA:  Left hip pain. The patient suffered 2 falls 5-6 days ago. Initial encounter. EXAM: CT OF THE LEFT HIP WITHOUT CONTRAST TECHNIQUE: Multidetector CT imaging of the left hip was performed according to the standard protocol. Multiplanar CT image reconstructions were also generated. COMPARISON:  Plain films left hip earlier today. FINDINGS: Bones/Joint/Cartilage The left hip is located. No fracture is identified. No focal bony lesion is seen. There is no avascular necrosis of the left femoral head. Only minimal left hip joint space narrowing is identified. Ligaments Suboptimally assessed by CT. Muscles and Tendons Intact. Soft tissues Imaged intrapelvic contents demonstrate no  acute abnormality. IMPRESSION: Negative for fracture or other acute abnormality. Mild left hip osteoarthritis. Electronically Signed   By: Inge Rise M.D.   On: 12/16/2018 15:18   Dg Hip Unilat With Pelvis 2-3 Views Left  Result Date: 12/16/2018 CLINICAL DATA:  85 year old female with a history of fall and left hip pain EXAM: DG HIP (WITH OR WITHOUT PELVIS) 2-3V LEFT COMPARISON:  None. FINDINGS: Osteopenia. Bony pelvic ring intact with no acute displaced fracture. Bilateral hips projects normally over the acetabula. Degenerative changes of the hips.  No hip fracture identified. Degenerative changes of the lumbar spine. IMPRESSION: No acute bony abnormality. Osteopenia. Electronically Signed   By: Corrie Mckusick D.O.   On: 12/16/2018 12:49     Not all labs, radiology exams or other studies done during hospitalization come through on my EPIC note; however they are reviewed by me.    Assessment and Plan  Pars fracture involving L3 along with old compression fraction involving L3/osteoporosis- Case discussed with neurosurgery.  Dr. Sherwood Gambler recommended lumbar brace with follow-up in him in 2 months SNF- patient is admitted for OT/PT; continue tramadol for pain and Robaxin for muscle spasms and calcium plus D6 100-401 p.o. daily  Falls at home- patient does have balance issues, patient does have Parkinson's; history of vitamin B12 deficiency for which she takes supplements; no infectious etiology, UA unremarkable CK normal SNF- OT/PT  Parkinson's disease SNF- appears controlled except for balance issues, continue carbidopa levodopa ER 36.25-145 1 p.o. 4 times daily  Hypothyroidism SNF-last TSH was 0.03 on 112 mcg daily; Synthroid was decreased to 88 mg daily and will continue that  Overactive bladder SNF- not stated as uncontrolled; continue Myrbetriq 25 mg daily  History CVA SNF-  stable; continue ASA 81 mg daily  B12 deficiency SNF- last level was spring 2018 was 373; continue  thousand micrograms daily   Time spent greater than 45 minutes;> 50% of time with patient was spent reviewing records, labs, tests and studies, counseling and developing plan of care  Webb Silversmith D. Sheppard Coil, MD

## 2018-12-21 NOTE — Telephone Encounter (Signed)
Pt was on TCM report admitted 12/16/18 after 2 falls. Patient underwent work-up which did not reveal any fracture involving the left hip. Pars fracture was identified involving the L3. A compression fracture was noted involving L3 but this was thought to be stable compared to previous films. No neurological deficits were noted. Due to her significant pain and discomfort patient cannot ambulate. Pt D/C 12/20/18 to SNF. Per summary need to f/u w/PCP 1 weeks after being discharge.Marland KitchenJohny Chess

## 2018-12-22 ENCOUNTER — Encounter: Payer: Self-pay | Admitting: Internal Medicine

## 2018-12-23 ENCOUNTER — Telehealth: Payer: Self-pay

## 2018-12-23 NOTE — Telephone Encounter (Signed)
If patient is able to come in, it would be best for her to set up an OV to discuss with Dr. Quay Burow directly.

## 2018-12-23 NOTE — Telephone Encounter (Signed)
Copied from [place] [id number]. Topic: General - Other >> Dec 23, 2018 12:02 PM Windy Kalata wrote: Reason for CRM: Patient would like to talk to Dr. Quay Burow about her recent 2 falls that she went to the ER for, she states Dr was out when she fell.  Best call back is [phone]

## 2018-12-23 NOTE — Telephone Encounter (Signed)
LVM to inform patient she will need an appointment.

## 2018-12-24 NOTE — Telephone Encounter (Signed)
Pt stated she would like to be able to go home and have someone come into her home to work with her rather than be in rehab.

## 2018-12-25 NOTE — Telephone Encounter (Signed)
Is she in rehab now?  She can do PT at home, but rehab PT is typically better.

## 2018-12-27 NOTE — Telephone Encounter (Signed)
Spoke with pt to let her know response. She state she is going to stay put for now.

## 2019-01-06 ENCOUNTER — Non-Acute Institutional Stay (SKILLED_NURSING_FACILITY): Payer: Medicare Other | Admitting: Internal Medicine

## 2019-01-06 DIAGNOSIS — A491 Streptococcal infection, unspecified site: Secondary | ICD-10-CM

## 2019-01-06 DIAGNOSIS — N3090 Cystitis, unspecified without hematuria: Secondary | ICD-10-CM | POA: Diagnosis not present

## 2019-01-08 ENCOUNTER — Encounter: Payer: Self-pay | Admitting: Internal Medicine

## 2019-01-08 NOTE — Progress Notes (Addendum)
Location:  [place]:  [phone])  Provider: Hennie Duos MD  Binnie Rail, MD  Patient Care Team: Binnie Rail, MD as PCP - General (Internal Medicine) Tat, Eustace Quail, DO as Consulting Physician (Neurology) Rutherford Guys, MD as Consulting Physician (Ophthalmology)  Extended Emergency Contact Information Primary Emergency Contact: Pflieger,Joey & Jesusita Oka [place] of [place] Phone: [phone] Mobile Phone: [phone] Relation: Nephew Secondary Emergency Contact: Grafton Folk [place] Phone: [phone] Relation: Friend    Allergies: Atorvastatin; Crestor [rosuvastatin calcium]; and Vesicare [solifenacin]  Chief Complaint  Patient presents with  . Acute Visit    HPI: Patient is 85 y.o. female who is being seen for a UTI.  Several days ago patient was found to be more confused than usual and her urine had a smell.  Today patient complains of burning with urination.  Patient has had no fever chills nausea vomiting or any other systemic symptom.  Past Medical History:  Diagnosis Date  . Abnormal Pap smear of vagina 09/2006   Dr Nori Riis  . Abnormality of gait 05/24/2015   PMH R occipital infarct B12 deficiency H/o polio affected left leg  In Physical Therapy to improve balance & strength through [facility]  . Arthritis 12/25/2015  . Cervicalgia 01/17/2009   MRI 03/27/16:  IMPRESSION: 1. At C3-4 there is a mild broad-based disc bulge. Moderate bilateral facet arthropathy and uncovertebral degenerative changes resulting in moderate bilateral foraminal stenosis. 2. At C5-6 there is a broad-based disc osteophyte complex. Bilateral uncovertebral degenerative changes and facet arthropathy resulting in moderate bilateral foraminal stenosis.  . Fall 09/11/2017  . History of CVA (cerebrovascular accident) 07/20/2012   Chronic R occipital infarct ; stable on CT 05/21/2012 .Dr Jannifer Franklin , Neurology   . History of TIA  (transient ischemic attack) 01/26/2008  . HYPERLIPIDEMIA 01/26/2008   NMR Lipoprofile 2009: LDL 135 (1704/911), HDL 62, TG 131. LDL goal = < 100; ideally < 70. MGF MI in 30s No FH CVA  . Hypothyroidism   . Intention tremor 12/08/2013   Onset after fall 04/2012   . Irritable bowel syndrome (IBS) 01/17/2009   Dr. Unice Cobble  . Osteopenia    PMH fracture heel, wrist  . Parkinson disease ([place]) 06/25/2016   Dr Tat  . Pernicious anemia   . Post-polio syndrome    minor problems L leg  . Restless leg syndrome 12/25/2015   Dr Tat  . Subdural hygroma 09/11/2017  . Swallowing disorder 03/04/2017  . Urinary incontinence 09/07/2008   Dr McDiarmid, Urology   . Vitamin B12 deficiency   . Weakness 06/24/2017    Past Surgical History:  Procedure Laterality Date  . BLADDER SUSPENSION  2000  . CATARACT EXTRACTION, BILATERAL     Dr. Gershon Crane  . no colonoscopy     "I never felt I needed one "  . YAG LASER APPLICATION Left    Dr. Gershon Crane    Allergies as of 01/06/2019      Reactions   Atorvastatin Other (See Comments)   REACTION: Felt funny (only way patient could describe)   Crestor [rosuvastatin Calcium] Other (See Comments)   Leg cramps, muscle aches   Vesicare [solifenacin]    constipation      Medication List       Accurate as of January 06, 2019 11:59 PM. Always use your most recent med list.        aspirin 81 MG tablet  Take 81 mg by mouth daily.   CALCIUM + D PO Take 1 tablet by mouth daily. 600-400   Carbidopa-Levodopa ER 36.25-145 MG Cpcr Commonly known as:  RYTARY Take 1 tablet by mouth 4 (four) times daily.   docusate sodium 100 MG capsule Commonly known as:  COLACE Take 1 capsule (100 mg total) by mouth 2 (two) times daily.   GAS-X EXTRA STRENGTH 125 MG Caps Generic drug:  Simethicone Take 125 mg by mouth daily as needed (gas).   levothyroxine 88 MCG tablet Commonly known as:  SYNTHROID, LEVOTHROID Take 1 tablet (88 mcg total) by mouth daily.   methocarbamol 500  MG tablet Commonly known as:  ROBAXIN Take 1 tablet (500 mg total) by mouth every 6 (six) hours as needed for muscle spasms.   MYRBETRIQ PO Take 1 tablet by mouth daily.   traMADol 50 MG tablet Commonly known as:  ULTRAM Take 1 tablet (50 mg total) by mouth every 6 (six) hours as needed (for 2 weeks).   vitamin B-12 1000 MCG tablet Commonly known as:  CYANOCOBALAMIN Take 1 tablet (1,000 mcg total) by mouth daily.       No orders of the defined types were placed in this encounter.   Immunization History  Administered Date(s) Administered  . Tdap 03/04/2017, 09/11/2017  . Zoster 01/14/2008    Social History   Tobacco Use  . Smoking status: Never Smoker  . Smokeless tobacco: Never Used  Substance Use Topics  . Alcohol use: Yes    Alcohol/week: 1.0 standard drinks    Types: 1 Glasses of wine per week    Comment: 0.6 oz per week    Review of Systems  DATA OBTAINED: from patient, nurse GENERAL:  no fevers, fatigue, appetite changes SKIN: No itching, rash HEENT: No complaint RESPIRATORY: No cough, wheezing, SOB CARDIAC: No chest pain, palpitations, lower extremity edema  GI: No abdominal pain, No N/V/D or constipation, No heartburn or reflux  GU: + dysuria, frequency or urgency, or incontinence, urine was smell; prolapsed uterus with cervix at meatus MUSCULOSKELETAL: No unrelieved bone/joint pain NEUROLOGIC: No headache, dizziness  PSYCHIATRIC: No overt anxiety or sadness  Vitals:   01/08/19 2009  BP: 132/69  Pulse: 79  Resp: 17  Temp: (!) 96.9 F (36.1 C)   Body mass index is 16.21 kg/m. Physical Exam  GENERAL APPEARANCE: Alert, conversant, No acute distress  SKIN: No diaphoresis rash HEENT: Unremarkable RESPIRATORY: Breathing is even, unlabored. Lung sounds are clear   CARDIOVASCULAR: Heart RRR no murmurs, rubs or gallops. No peripheral edema  GASTROINTESTINAL: Abdomen is soft, non-tender, not distended w/ normal bowel sounds.  GENITOURINARY: Bladder  non tender, not distended  MUSCULOSKELETAL: No abnormal joints or musculature NEUROLOGIC: Cranial nerves 2-12 grossly intact. Moves all extremities PSYCHIATRIC: Mood and affect appropriate to situation with dementia, no behavioral issues  Patient Active Problem List   Diagnosis Date Noted  . Malnutrition of moderate degree 12/17/2018  . Pars defect of lumbar spine 12/16/2018  . Acute back pain 12/16/2018  . Compression fracture of L3 vertebra ([place]) 11/02/2018  . Right shoulder pain 11/02/2018  . Left groin pain 10/19/2018  . Low back pain 10/19/2018  . Dysuria 10/12/2018  . Osteoporosis 05/30/2018  . Poor posture 04/28/2018  . Physical deconditioning 04/28/2018  . Anxiety 03/02/2018  . Urinary frequency 03/02/2018  . Foot pain, bilateral 12/21/2017  . Bilateral leg edema 12/02/2017  . Overactive bladder 09/19/2017  . Post-polio syndrome   . Vitamin B12 deficiency   .  Fall 09/11/2017  . Subdural hygroma 09/11/2017  . UTI (urinary tract infection) 06/24/2017  . Swallowing disorder 03/04/2017  . Parkinson disease ([place]) 06/25/2016  . Restless leg syndrome 12/25/2015  . Arthritis 12/25/2015  . Abnormality of gait 05/24/2015  . History of CVA (cerebrovascular accident) 07/20/2012  . Cervicalgia 01/17/2009  . Irritable bowel syndrome (IBS) 01/17/2009  . Urinary incontinence 09/07/2008  . Anemia 04/26/2008  . HYPERLIPIDEMIA 01/26/2008  . History of TIA (transient ischemic attack) 01/26/2008  . Hypothyroidism 01/04/2008  . Abnormal Pap smear of vagina 09/24/2006    CMP     Component Value Date/Time   NA 140 12/17/2018 0519   NA 139 05/28/2018   K 3.6 12/17/2018 0519   CL 107 12/17/2018 0519   CO2 26 12/17/2018 0519   GLUCOSE 89 12/17/2018 0519   BUN 20 12/17/2018 0519   BUN 13 05/28/2018   CREATININE 0.86 12/17/2018 0519   CALCIUM 8.5 (L) 12/17/2018 0519   PROT 6.4 11/02/2018 1428   ALBUMIN 3.6 11/02/2018 1428   AST 28 11/02/2018 1428   ALT 15 11/02/2018 1428    ALKPHOS 127 (H) 11/02/2018 1428   BILITOT 0.8 11/02/2018 1428   GFRNONAA >60 12/17/2018 0519   GFRAA >60 12/17/2018 0519   Recent Labs    11/02/18 1428 12/16/18 1357 12/17/18 0519  NA 140 139 140  K 3.8 3.6 3.6  CL 105 104 107  CO2 28 27 26   GLUCOSE 93 93 89  BUN 18 17 20   CREATININE 0.66 0.64 0.86  CALCIUM 8.7 9.1 8.5*   Recent Labs    03/02/18 1430 04/28/18 1521 11/02/18 1428  AST 30 23 28   ALT 3 2 15   ALKPHOS 92 101 127*  BILITOT 0.9 0.7 0.8  PROT 6.5 6.7 6.4  ALBUMIN 3.5 3.9 3.6   Recent Labs    05/23/18 1238 05/28/18 11/02/18 1428 12/16/18 1357  WBC 5.0 4.4 4.9 4.9  NEUTROABS 3.3  --  3.1 2.9  HGB 10.6* 11.3* 10.5* 12.0  HCT 31.8* 33* 31.2* 38.3  MCV 85.3  --  83.3 87.8  PLT 206 252 283.0 203   No results for input(s): CHOL, LDLCALC, TRIG in the last 8760 hours.  Invalid input(s): HCL No results found for: [facility] Lab Results  Component Value Date   TSH 0.03 (L) 11/02/2018   Lab Results  Component Value Date   HGBA1C 5.8 12/14/2009   Lab Results  Component Value Date   CHOL 145 08/06/2016   HDL 55.00 08/06/2016   LDLCALC 61 08/06/2016   LDLDIRECT 120.8 06/10/2010   TRIG 146.0 08/06/2016   CHOLHDL 3 08/06/2016    Significant Diagnostic Results in last 30 days:  Dg Chest 2 View  Result Date: 12/16/2018 CLINICAL DATA:  Two recent falls. EXAM: CHEST - 2 VIEW COMPARISON:  05/23/2018 and 11/27/2017. Thoracic spine dated 07/22/2012. FINDINGS: The cardiac silhouette remains borderline enlarged. Clear lungs with normal vascularity. Mild scoliosis. Approximately 35% midthoracic spine vertebral superior endplate compression deformity with no visible acute fracture lines. This appears grossly unchanged since 11/27/2017. There is also an old, healed, displaced fracture of the sternum. No acute fractures are seen. IMPRESSION: No acute abnormality. Electronically Signed   By: Claudie Revering M.D.   On: 12/16/2018 14:24   Ct Head Wo Contrast  Result Date:  12/16/2018 CLINICAL DATA:  Mental status change. EXAM: CT HEAD WITHOUT CONTRAST TECHNIQUE: Contiguous axial images were obtained from the base of the skull through the vertex without intravenous contrast. COMPARISON:  05/23/2018 FINDINGS: Brain: Stable age related cerebral atrophy, ventriculomegaly and periventricular white matter disease. Remote left caudate and right occipital infarcts with encephalomalacia. No extra-axial fluid collections are identified. No CT findings for acute hemispheric infarction or intracranial hemorrhage. No mass lesions. The brainstem and cerebellum are normal. Vascular: Stable vascular calcifications. No definite aneurysm or hyperdense vessels. Skull: No skull fracture or bone lesions. Sinuses/Orbits: The paranasal sinuses and mastoid air cells are clear. Other: No scalp lesions or hematoma. IMPRESSION: 1. Stable age related cerebral atrophy, ventriculomegaly and advanced periventricular white matter disease. 2. Remote infarcts. 3. No acute intracranial findings. Electronically Signed   By: Marijo Sanes M.D.   On: 12/16/2018 15:22   Ct Lumbar Spine Wo Contrast  Result Date: 12/16/2018 CLINICAL DATA:  Left hip pain.  Fell twice over the past weekend. EXAM: CT LUMBAR SPINE WITHOUT CONTRAST TECHNIQUE: Multidetector CT imaging of the lumbar spine was performed without intravenous contrast administration. Multiplanar CT image reconstructions were also generated. COMPARISON:  Lumbar radiographs 10/19/2018 FINDINGS: Segmentation: There are five lumbar type vertebral bodies. The last full intervertebral disc space is labeled L5-S1. This correlates with the lumbar radiographs. Alignment: The overall alignment is maintained. There is degenerative anterolisthesis of L3 and L4 due to facet disease. There is also a nondisplaced pars interarticularis fracture on the right at L3. Vertebrae: Significant superior endplate compression fracture of L3. This appears relatively stable when compared to  prior radiographs from November 2019. There is mild retropulsion involving the posterosuperior aspect of the vertebral body but the spinal canal is generous in is no significant spinal canal compromise. There is also a pars defect on the right at L3. The pedicles are intact. Advanced osteoporosis. Paraspinal and other soft tissues: No significant paraspinal or retroperitoneal findings. Advanced calcifications involving the aorta. The visualized sacrum is intact. Disc levels: The spinal canal is generous. No significant spinal stenosis. There are broad-based bulging uncovered discs at L4-5 and L5-S1 due to the anterolisthesis but no significant spinal or foraminal stenosis. IMPRESSION: 1. Stable appearing L3 compression fracture. Mild retropulsion but no canal compromise. 2. Small nondisplaced pars fracture on the right at L3. 3. Degenerative/non isthmic spondylolisthesis of L3 and L4. 4. Very generous spinal canal without findings for significant spinal or foraminal stenosis. 5. Osteoporosis. Electronically Signed   By: Marijo Sanes M.D.   On: 12/16/2018 15:39   Ct Hip Left Wo Contrast  Result Date: 12/16/2018 CLINICAL DATA:  Left hip pain. The patient suffered 2 falls 5-6 days ago. Initial encounter. EXAM: CT OF THE LEFT HIP WITHOUT CONTRAST TECHNIQUE: Multidetector CT imaging of the left hip was performed according to the standard protocol. Multiplanar CT image reconstructions were also generated. COMPARISON:  Plain films left hip earlier today. FINDINGS: Bones/Joint/Cartilage The left hip is located. No fracture is identified. No focal bony lesion is seen. There is no avascular necrosis of the left femoral head. Only minimal left hip joint space narrowing is identified. Ligaments Suboptimally assessed by CT. Muscles and Tendons Intact. Soft tissues Imaged intrapelvic contents demonstrate no acute abnormality. IMPRESSION: Negative for fracture or other acute abnormality. Mild left hip osteoarthritis.  Electronically Signed   By: Inge Rise M.D.   On: 12/16/2018 15:18   Dg Hip Unilat With Pelvis 2-3 Views Left  Result Date: 12/16/2018 CLINICAL DATA:  85 year old female with a history of fall and left hip pain EXAM: DG HIP (WITH OR WITHOUT PELVIS) 2-3V LEFT COMPARISON:  None. FINDINGS: Osteopenia. Bony pelvic ring intact with no acute displaced  fracture. Bilateral hips projects normally over the acetabula. Degenerative changes of the hips.  No hip fracture identified. Degenerative changes of the lumbar spine. IMPRESSION: No acute bony abnormality. Osteopenia. Electronically Signed   By: Corrie Mckusick D.O.   On: 12/16/2018 12:49    Assessment and Plan  >100,000 strep viridans cystitis- I am calling it a cystitis because patient has symptoms, a risk factor with prolapsed uterus and greater than 100,000 of bacteria; will treat with amoxicillin 875 twice daily for 7 days    Inocencio Homes, MD

## 2019-01-10 ENCOUNTER — Non-Acute Institutional Stay (SKILLED_NURSING_FACILITY): Payer: Medicare Other | Admitting: Internal Medicine

## 2019-01-10 ENCOUNTER — Encounter: Payer: Self-pay | Admitting: Internal Medicine

## 2019-01-10 DIAGNOSIS — E538 Deficiency of other specified B group vitamins: Secondary | ICD-10-CM

## 2019-01-10 DIAGNOSIS — M4306 Spondylolysis, lumbar region: Secondary | ICD-10-CM | POA: Diagnosis not present

## 2019-01-10 DIAGNOSIS — R269 Unspecified abnormalities of gait and mobility: Secondary | ICD-10-CM

## 2019-01-10 DIAGNOSIS — W19XXXD Unspecified fall, subsequent encounter: Secondary | ICD-10-CM | POA: Diagnosis not present

## 2019-01-10 DIAGNOSIS — E034 Atrophy of thyroid (acquired): Secondary | ICD-10-CM | POA: Diagnosis not present

## 2019-01-10 DIAGNOSIS — G20 Parkinson's disease: Secondary | ICD-10-CM | POA: Diagnosis not present

## 2019-01-10 DIAGNOSIS — N3281 Overactive bladder: Secondary | ICD-10-CM

## 2019-01-10 MED ORDER — DOCUSATE SODIUM 100 MG PO CAPS: 100.0000 mg | ORAL_CAPSULE | Freq: Every day | ORAL | 0 refills | Status: DC

## 2019-01-10 MED ORDER — CARBIDOPA-LEVODOPA ER 36.25-145 MG PO CPCR: 1.0000 | ORAL_CAPSULE | Freq: Four times a day (QID) | ORAL | 0 refills | Status: DC

## 2019-01-10 MED ORDER — TRAMADOL HCL 50 MG PO TABS: 50.0000 mg | ORAL_TABLET | Freq: Four times a day (QID) | ORAL | 0 refills | Status: DC | PRN

## 2019-01-10 MED ORDER — METHOCARBAMOL 500 MG PO TABS: 500.0000 mg | ORAL_TABLET | Freq: Four times a day (QID) | ORAL | 0 refills | Status: DC | PRN

## 2019-01-10 MED ORDER — VITAMIN B-12 1000 MCG PO TABS: 1000.0000 ug | ORAL_TABLET | Freq: Every day | ORAL | 0 refills | Status: DC

## 2019-01-10 MED ORDER — MIRABEGRON ER 25 MG PO TB24: 25.0000 mg | ORAL_TABLET | Freq: Every day | ORAL | 0 refills | Status: DC

## 2019-01-10 MED ORDER — LEVOTHYROXINE SODIUM 88 MCG PO TABS: 88.0000 ug | ORAL_TABLET | Freq: Every day | ORAL | 0 refills | Status: DC

## 2019-01-10 NOTE — Progress Notes (Signed)
Location:  [facility] Room Number: 563J Place of Service:  SNF [phone])  Provider:  Hennie Duos MD  PCP: Binnie Rail, MD Patient Care Team: Binnie Rail, MD as PCP - General (Internal Medicine) Tat, Elizabeth Quail, DO as Consulting Physician (Neurology) Elizabeth Guys, MD as Consulting Physician (Ophthalmology)  Extended Emergency Contact Information Primary Emergency Contact: [name],Elizabeth & Elizabeth [name] [place] Phone: [phone] Mobile Phone: [phone] Relation: Nephew Secondary Emergency Contact: Elizabeth [name] [place] Phone: [phone] Relation: Friend  Allergies  Allergen Reactions  . Atorvastatin Other (See Comments)    REACTION: Felt funny (only way patient could describe)  . Crestor [Rosuvastatin Calcium] Other (See Comments)    Leg cramps, muscle aches  . Vesicare [Solifenacin]     constipation    Chief Complaint  Patient presents with  . Discharge Note    Discharge from [facility]    HPI:  85 y.o. female with Parkinson's disease, hypothyroidism, B12 deficiency, who lives alone at home.  Patient had 2 falls as a result of her loss of balance.  After her second fall she was unable to ambulate and was sent to the emergency department.  In the ED she underwent a work-up which did not reveal any fracture involving the hip but did have a identify a pars fracture involving L3.  There was a compression fracture involving L3 but this was thought to be stable compared to prior films.  No neurological deficit was noted due to her significant pain and discomfort patient could not ambulate.  Patient was admitted to [facility] from 1/12-27 for safety purposes and pain control.  Neurosurgery recommended a lumbar brace and OT/PT.  Patient was admitted to skilled nursing facility for OT/PT and is now ready to be discharged to home.    Past Medical History:  Diagnosis Date  . Abnormal Pap smear of  vagina 09/2006   Dr Nori Riis  . Abnormality of gait 05/24/2015   PMH R occipital infarct B12 deficiency H/o polio affected left leg  In Physical Therapy to improve balance & strength through [facility]  . Arthritis 12/25/2015  . Cervicalgia 01/17/2009   MRI 03/27/16:  IMPRESSION: 1. At C3-4 there is a mild broad-based disc bulge. Moderate bilateral facet arthropathy and uncovertebral degenerative changes resulting in moderate bilateral foraminal stenosis. 2. At C5-6 there is a broad-based disc osteophyte complex. Bilateral uncovertebral degenerative changes and facet arthropathy resulting in moderate bilateral foraminal stenosis.  . Fall 09/11/2017  . History of CVA (cerebrovascular accident) 07/20/2012   Chronic R occipital infarct ; stable on CT 05/21/2012 .Dr Jannifer Franklin , Neurology   . History of TIA (transient ischemic attack) 01/26/2008  . HYPERLIPIDEMIA 01/26/2008   NMR Lipoprofile 2009: LDL 135 (1704/911), HDL 62, TG 131. LDL goal = < 100; ideally < 70. MGF MI in 69s No FH CVA  . Hypothyroidism   . Intention tremor 12/08/2013   Onset after fall 04/2012   . Irritable bowel syndrome (IBS) 01/17/2009   Dr. Unice Cobble  . Osteopenia    PMH fracture heel, wrist  . Parkinson disease ([place]) 06/25/2016   Dr Tat  . Pernicious anemia   . Post-polio syndrome    minor problems L leg  . Restless leg syndrome 12/25/2015   Dr Tat  . Subdural hygroma 09/11/2017  . Swallowing disorder 03/04/2017  . Urinary incontinence 09/07/2008   Dr McDiarmid, Urology   . Vitamin B12 deficiency   .  Weakness 06/24/2017    Past Surgical History:  Procedure Laterality Date  . BLADDER SUSPENSION  2000  . CATARACT EXTRACTION, BILATERAL     Dr. Gershon Crane  . no colonoscopy     "I never felt I needed one "  . YAG LASER APPLICATION Left    Dr. Gershon Crane     reports that she has never smoked. She has never used smokeless tobacco. She reports current alcohol use of about 1.0 standard drinks of alcohol per week. She reports that she  does not use drugs. Social History   Socioeconomic History  . Marital status: Single    Spouse name: Not on file  . Number of children: Not on file  . Years of education: Not on file  . Highest education level: Not on file  Occupational History  . Not on file  Social Needs  . Financial resource strain: Not on file  . Food insecurity:    Worry: Not on file    Inability: Not on file  . Transportation needs:    Medical: Not on file    Non-medical: Not on file  Tobacco Use  . Smoking status: Never Smoker  . Smokeless tobacco: Never Used  Substance and Sexual Activity  . Alcohol use: Yes    Alcohol/week: 1.0 standard drinks    Types: 1 Glasses of wine per week    Comment: 0.6 oz per week  . Drug use: No  . Sexual activity: Not on file  Lifestyle  . Physical activity:    Days per week: Not on file    Minutes per session: Not on file  . Stress: Not on file  Relationships  . Social connections:    Talks on phone: Not on file    Gets together: Not on file    Attends religious service: Not on file    Active member of club or organization: Not on file    Attends meetings of clubs or organizations: Not on file    Relationship status: Not on file  . Intimate partner violence:    Fear of current or ex partner: Not on file    Emotionally abused: Not on file    Physically abused: Not on file    Forced sexual activity: Not on file  Other Topics Concern  . Not on file  Social History Narrative  . Not on file    Pertinent  Health Maintenance Due  Topic Date Due  . PNA vac Low Risk Adult (1 of 2 - PCV13) 01/11/2019 (Originally 02/02/1998)  . DEXA SCAN  03/03/2019 (Originally 05/03/2016)  . INFLUENZA VACCINE  03/24/2019 (Originally 06/24/2018)    Medications: Allergies as of 01/10/2019      Reactions   Atorvastatin Other (See Comments)   REACTION: Felt funny (only way patient could describe)   Crestor [rosuvastatin Calcium] Other (See Comments)   Leg cramps, muscle aches    Vesicare [solifenacin]    constipation      Medication List       Accurate as of January 10, 2019  2:53 PM. Always use your most recent med list.        aspirin 81 MG tablet Take 81 mg by mouth daily.   CALCIUM + D PO Take 1 tablet by mouth daily. 600-400   Carbidopa-Levodopa ER 36.25-145 MG Cpcr Commonly known as:  RYTARY Take 1 tablet by mouth 4 (four) times daily.   docusate sodium 100 MG capsule Commonly known as:  COLACE Take 1 capsule (100  mg total) by mouth daily.   GAS-X EXTRA STRENGTH 125 MG Caps Generic drug:  Simethicone Take 125 mg by mouth daily as needed (gas).   levothyroxine 88 MCG tablet Commonly known as:  SYNTHROID, LEVOTHROID Take 1 tablet (88 mcg total) by mouth daily.   methocarbamol 500 MG tablet Commonly known as:  ROBAXIN Take 1 tablet (500 mg total) by mouth every 6 (six) hours as needed for muscle spasms.   mirabegron ER 25 MG Tb24 tablet Commonly known as:  MYRBETRIQ Take 1 tablet (25 mg total) by mouth daily.   traMADol 50 MG tablet Commonly known as:  ULTRAM Take 1 tablet (50 mg total) by mouth every 6 (six) hours as needed.   vitamin B-12 1000 MCG tablet Commonly known as:  CYANOCOBALAMIN Take 1 tablet (1,000 mcg total) by mouth daily.        Vitals:   01/10/19 1224  BP: 130/70  Pulse: 73  Resp: 18  Temp: (!) 97.4 F (36.3 C)  Weight: 83 lb (37.6 kg)  Height: 5' (1.524 m)   Body mass index is 16.21 kg/m.  Physical Exam  GENERAL APPEARANCE: Alert, conversant. No acute distress.  HEENT: Unremarkable. RESPIRATORY: Breathing is even, unlabored. Lung sounds are clear   CARDIOVASCULAR: Heart regular no murmurs, rubs or gallops. No peripheral edema.  GASTROINTESTINAL: Abdomen is soft, non-tender, not distended w/ normal bowel sounds.  NEUROLOGIC: Cranial nerves 2-12 grossly intact. Moves all extremities   Labs reviewed: Basic Metabolic Panel: Recent Labs    11/02/18 1428 12/16/18 1357 12/17/18 0519  NA 140 139  140  K 3.8 3.6 3.6  CL 105 104 107  CO2 28 27 26   GLUCOSE 93 93 89  BUN 18 17 20   CREATININE 0.66 0.64 0.86  CALCIUM 8.7 9.1 8.5*   No results found for: [facility] Liver Function Tests: Recent Labs    03/02/18 1430 04/28/18 1521 11/02/18 1428  AST 30 23 28   ALT 3 2 15   ALKPHOS 92 101 127*  BILITOT 0.9 0.7 0.8  PROT 6.5 6.7 6.4  ALBUMIN 3.5 3.9 3.6   No results for input(s): LIPASE, AMYLASE in the last 8760 hours. No results for input(s): AMMONIA in the last 8760 hours. CBC: Recent Labs    05/23/18 1238 05/28/18 11/02/18 1428 12/16/18 1357  WBC 5.0 4.4 4.9 4.9  NEUTROABS 3.3  --  3.1 2.9  HGB 10.6* 11.3* 10.5* 12.0  HCT 31.8* 33* 31.2* 38.3  MCV 85.3  --  83.3 87.8  PLT 206 252 283.0 203   Lipid No results for input(s): CHOL, HDL, LDLCALC, TRIG in the last 8760 hours. Cardiac Enzymes: Recent Labs    05/23/18 1238 12/16/18 1902  CKTOTAL  --  95  TROPONINI <0.03  --    BNP: No results for input(s): BNP in the last 8760 hours. CBG: No results for input(s): GLUCAP in the last 8760 hours.  Procedures and Imaging Studies During Stay: Dg Chest 2 View  Result Date: 12/16/2018 CLINICAL DATA:  Two recent falls. EXAM: CHEST - 2 VIEW COMPARISON:  05/23/2018 and 11/27/2017. Thoracic spine dated 07/22/2012. FINDINGS: The cardiac silhouette remains borderline enlarged. Clear lungs with normal vascularity. Mild scoliosis. Approximately 35% midthoracic spine vertebral superior endplate compression deformity with no visible acute fracture lines. This appears grossly unchanged since 11/27/2017. There is also an old, healed, displaced fracture of the sternum. No acute fractures are seen. IMPRESSION: No acute abnormality. Electronically Signed   By: Claudie Revering M.D.   On: 12/16/2018  14:24   Ct Head Wo Contrast  Result Date: 12/16/2018 CLINICAL DATA:  Mental status change. EXAM: CT HEAD WITHOUT CONTRAST TECHNIQUE: Contiguous axial images were obtained from the base of the skull  through the vertex without intravenous contrast. COMPARISON:  05/23/2018 FINDINGS: Brain: Stable age related cerebral atrophy, ventriculomegaly and periventricular white matter disease. Remote left caudate and right occipital infarcts with encephalomalacia. No extra-axial fluid collections are identified. No CT findings for acute hemispheric infarction or intracranial hemorrhage. No mass lesions. The brainstem and cerebellum are normal. Vascular: Stable vascular calcifications. No definite aneurysm or hyperdense vessels. Skull: No skull fracture or bone lesions. Sinuses/Orbits: The paranasal sinuses and mastoid air cells are clear. Other: No scalp lesions or hematoma. IMPRESSION: 1. Stable age related cerebral atrophy, ventriculomegaly and advanced periventricular white matter disease. 2. Remote infarcts. 3. No acute intracranial findings. Electronically Signed   By: Marijo Sanes M.D.   On: 12/16/2018 15:22   Ct Lumbar Spine Wo Contrast  Result Date: 12/16/2018 CLINICAL DATA:  Left hip pain.  Fell twice over the past weekend. EXAM: CT LUMBAR SPINE WITHOUT CONTRAST TECHNIQUE: Multidetector CT imaging of the lumbar spine was performed without intravenous contrast administration. Multiplanar CT image reconstructions were also generated. COMPARISON:  Lumbar radiographs 10/19/2018 FINDINGS: Segmentation: There are five lumbar type vertebral bodies. The last full intervertebral disc space is labeled L5-S1. This correlates with the lumbar radiographs. Alignment: The overall alignment is maintained. There is degenerative anterolisthesis of L3 and L4 due to facet disease. There is also a nondisplaced pars interarticularis fracture on the right at L3. Vertebrae: Significant superior endplate compression fracture of L3. This appears relatively stable when compared to prior radiographs from November 2019. There is mild retropulsion involving the posterosuperior aspect of the vertebral body but the spinal canal is generous  in is no significant spinal canal compromise. There is also a pars defect on the right at L3. The pedicles are intact. Advanced osteoporosis. Paraspinal and other soft tissues: No significant paraspinal or retroperitoneal findings. Advanced calcifications involving the aorta. The visualized sacrum is intact. Disc levels: The spinal canal is generous. No significant spinal stenosis. There are broad-based bulging uncovered discs at L4-5 and L5-S1 due to the anterolisthesis but no significant spinal or foraminal stenosis. IMPRESSION: 1. Stable appearing L3 compression fracture. Mild retropulsion but no canal compromise. 2. Small nondisplaced pars fracture on the right at L3. 3. Degenerative/non isthmic spondylolisthesis of L3 and L4. 4. Very generous spinal canal without findings for significant spinal or foraminal stenosis. 5. Osteoporosis. Electronically Signed   By: Marijo Sanes M.D.   On: 12/16/2018 15:39   Ct Hip Left Wo Contrast  Result Date: 12/16/2018 CLINICAL DATA:  Left hip pain. The patient suffered 2 falls 5-6 days ago. Initial encounter. EXAM: CT OF THE LEFT HIP WITHOUT CONTRAST TECHNIQUE: Multidetector CT imaging of the left hip was performed according to the standard protocol. Multiplanar CT image reconstructions were also generated. COMPARISON:  Plain films left hip earlier today. FINDINGS: Bones/Joint/Cartilage The left hip is located. No fracture is identified. No focal bony lesion is seen. There is no avascular necrosis of the left femoral head. Only minimal left hip joint space narrowing is identified. Ligaments Suboptimally assessed by CT. Muscles and Tendons Intact. Soft tissues Imaged intrapelvic contents demonstrate no acute abnormality. IMPRESSION: Negative for fracture or other acute abnormality. Mild left hip osteoarthritis. Electronically Signed   By: Inge Rise M.D.   On: 12/16/2018 15:18   Dg Hip Unilat With Pelvis 2-3  Views Left  Result Date: 12/16/2018 CLINICAL DATA:   85 year old female with a history of fall and left hip pain EXAM: DG HIP (WITH OR WITHOUT PELVIS) 2-3V LEFT COMPARISON:  None. FINDINGS: Osteopenia. Bony pelvic ring intact with no acute displaced fracture. Bilateral hips projects normally over the acetabula. Degenerative changes of the hips.  No hip fracture identified. Degenerative changes of the lumbar spine. IMPRESSION: No acute bony abnormality. Osteopenia. Electronically Signed   By: Corrie Mckusick D.O.   On: 12/16/2018 12:49    Assessment/Plan:   Pars defect of lumbar spine  Fall, subsequent encounter  Hypothyroidism due to acquired atrophy of thyroid  Parkinson disease ([place])  Overactive bladder  Abnormality of gait  Vitamin B12 deficiency   Patient is being discharged with the following home health services: OT/PT/nursing  Patient is being discharged with the following durable medical equipment: None  Patient has been advised to f/u with their PCP in 1-2 weeks to bring them up to date on their rehab stay.  Social services at facility was responsible for arranging this appointment.  Pt was provided with a 30 day supply of prescriptions for medications and refills must be obtained from their PCP.  For controlled substances, a more limited supply may be provided adequate until PCP appointment only.  Medications have been reconciled.  I am spent greater than 30 minutes;> 50% of time with patient was spent reviewing records, labs, tests and studies, counseling and developing plan of care  Inocencio Homes, MD

## 2019-01-14 ENCOUNTER — Telehealth: Payer: Self-pay | Admitting: Internal Medicine

## 2019-01-14 NOTE — Telephone Encounter (Signed)
Copied from [place] [id number]. Topic: Quick Communication - See Telephone Encounter >> Jan 14, 2019  3:07 PM Blase Mess A wrote: CRM for notification. See Telephone encounter for: 01/14/19.  Darlina Guys from [facility] calling to notify Dr. Quay Burow know that start skilled nursing Monday 01/17/19 [phone]

## 2019-01-14 NOTE — Telephone Encounter (Signed)
Routing to dr burns, fyi... 

## 2019-01-15 NOTE — Telephone Encounter (Signed)
noted 

## 2019-01-17 ENCOUNTER — Telehealth: Payer: Self-pay | Admitting: Internal Medicine

## 2019-01-17 DIAGNOSIS — E440 Moderate protein-calorie malnutrition: Secondary | ICD-10-CM | POA: Diagnosis not present

## 2019-01-17 DIAGNOSIS — G20 Parkinson's disease: Secondary | ICD-10-CM | POA: Diagnosis not present

## 2019-01-17 DIAGNOSIS — M1612 Unilateral primary osteoarthritis, left hip: Secondary | ICD-10-CM | POA: Diagnosis not present

## 2019-01-17 DIAGNOSIS — M9971 Connective tissue and disc stenosis of intervertebral foramina of cervical region: Secondary | ICD-10-CM | POA: Diagnosis not present

## 2019-01-17 DIAGNOSIS — M8008XD Age-related osteoporosis with current pathological fracture, vertebra(e), subsequent encounter for fracture with routine healing: Secondary | ICD-10-CM | POA: Diagnosis not present

## 2019-01-17 DIAGNOSIS — N3281 Overactive bladder: Secondary | ICD-10-CM | POA: Diagnosis not present

## 2019-01-17 DIAGNOSIS — Z9181 History of falling: Secondary | ICD-10-CM | POA: Diagnosis not present

## 2019-01-17 DIAGNOSIS — D510 Vitamin B12 deficiency anemia due to intrinsic factor deficiency: Secondary | ICD-10-CM | POA: Diagnosis not present

## 2019-01-17 DIAGNOSIS — Z8673 Personal history of transient ischemic attack (TIA), and cerebral infarction without residual deficits: Secondary | ICD-10-CM | POA: Diagnosis not present

## 2019-01-17 DIAGNOSIS — M8580 Other specified disorders of bone density and structure, unspecified site: Secondary | ICD-10-CM | POA: Diagnosis not present

## 2019-01-17 DIAGNOSIS — E034 Atrophy of thyroid (acquired): Secondary | ICD-10-CM | POA: Diagnosis not present

## 2019-01-17 DIAGNOSIS — M4316 Spondylolisthesis, lumbar region: Secondary | ICD-10-CM | POA: Diagnosis not present

## 2019-01-17 DIAGNOSIS — G2581 Restless legs syndrome: Secondary | ICD-10-CM | POA: Diagnosis not present

## 2019-01-17 DIAGNOSIS — K581 Irritable bowel syndrome with constipation: Secondary | ICD-10-CM | POA: Diagnosis not present

## 2019-01-17 DIAGNOSIS — G319 Degenerative disease of nervous system, unspecified: Secondary | ICD-10-CM | POA: Diagnosis not present

## 2019-01-17 DIAGNOSIS — N390 Urinary tract infection, site not specified: Secondary | ICD-10-CM | POA: Diagnosis not present

## 2019-01-17 DIAGNOSIS — D181 Lymphangioma, any site: Secondary | ICD-10-CM | POA: Diagnosis not present

## 2019-01-17 DIAGNOSIS — E785 Hyperlipidemia, unspecified: Secondary | ICD-10-CM | POA: Diagnosis not present

## 2019-01-17 DIAGNOSIS — M47812 Spondylosis without myelopathy or radiculopathy, cervical region: Secondary | ICD-10-CM | POA: Diagnosis not present

## 2019-01-17 NOTE — Telephone Encounter (Signed)
Copied from [place] [phone]. Topic: Quick Communication - See Telephone Encounter >> Jan 17, 2019  4:06 PM Berneta Levins wrote: CRM for notification. See Telephone encounter for: 01/17/19.  Neoma Laming with [facility] calling to make PCP aware that pt declined having nursing services. Neoma Laming can be reached at [phone]

## 2019-01-17 NOTE — Telephone Encounter (Signed)
FYI

## 2019-01-20 NOTE — Progress Notes (Signed)
Subjective:    Patient ID: Elizabeth [name], female    DOB: May 14, 1931, 85 y.o.   MRN: [id number]  HPI The patient is here for follow up from the hospital.  ED 12/16/18:  She was seen at [facility] after a 2 falls at home over the weekend.  The second fall she was on the floor for hours.  She had pelvic and left hip pain.  She was not able to ambulate since her falls.  She was directed to go to the ED.   Admitted 12/16/18-12/20/2018:  Her pain was 6-7/10 in both her back and left hip.  She had no other symptoms.  X-rays in the ED showed no left hip fracture, but she did have a pars fracture involving the L3.  She also had a compression fracture in L3 that was stable compared to prior imaging.  She had no neurological deficits.    Pars fracture involoving L3, stable L3 compression fracture: Neurosurgery consulted and recommended a lumbar brace Follow-up with neurosurgery in 2 months Pain medications, muscle relaxants PT/OT evaluated and recommended skilled nursing facility  Left hip pain: No fracture Has restricted range of motion of the left lower extremity-continued to improve  Falls at home: Related to poor balance, Parkinson's disease No evidence of infection CK normal PT/OT and short-term rehab at nursing facility  Parkinson's disease Continue with home medications, stable  Hypothyroidism Stable Continue home medications    She is at home now.  She is here today with a home health aide.  She has declined home nursing services.  She is starting PT/OT next week. She is eating well but has lost weight.   Pars fracture involoving L3, stable L3 compression fracture:  She is taking tramadol at night.  She has taken tramadol during the day only on a couple of occasions.  She is not taking any tylenol.  She has lower back pain, left hip and leg pain.  Her pain is not always controlled.  She has heard good things about Dr Ellene Route and would like to follow up with him.   Anxiety:  Her  friend who was not able to be here left a note stating she has increased anxiety and worries a lot.  She denies depression.  She is not on anything for anxiety.   She is taking her other medications daily.    She has mild constipation which is controlled with stool softeners.    She has no other concerns.   Medications and allergies reviewed with patient and updated if appropriate.  Patient Active Problem List   Diagnosis Date Noted  . Malnutrition of moderate degree 12/17/2018  . Pars defect of lumbar spine 12/16/2018  . Acute back pain 12/16/2018  . Compression fracture of L3 vertebra ([place]) 11/02/2018  . Right shoulder pain 11/02/2018  . Left groin pain 10/19/2018  . Low back pain 10/19/2018  . Dysuria 10/12/2018  . Osteoporosis 05/30/2018  . Poor posture 04/28/2018  . Physical deconditioning 04/28/2018  . Anxiety 03/02/2018  . Urinary frequency 03/02/2018  . Foot pain, bilateral 12/21/2017  . Bilateral leg edema 12/02/2017  . Overactive bladder 09/19/2017  . Post-polio syndrome   . Vitamin B12 deficiency   . Fall 09/11/2017  . Subdural hygroma 09/11/2017  . UTI (urinary tract infection) 06/24/2017  . Swallowing disorder 03/04/2017  . Parkinson disease ([place]) 06/25/2016  . Restless leg syndrome 12/25/2015  . Arthritis 12/25/2015  . Abnormality of gait 05/24/2015  . History of CVA (cerebrovascular  accident) 07/20/2012  . Cervicalgia 01/17/2009  . Irritable bowel syndrome (IBS) 01/17/2009  . Urinary incontinence 09/07/2008  . Anemia 04/26/2008  . HYPERLIPIDEMIA 01/26/2008  . History of TIA (transient ischemic attack) 01/26/2008  . Hypothyroidism 01/04/2008  . Abnormal Pap smear of vagina 09/24/2006    Current Outpatient Medications on File Prior to Visit  Medication Sig Dispense Refill  . aspirin 81 MG tablet Take 81 mg by mouth daily.      . Calcium Carbonate-Vitamin D (CALCIUM + D PO) Take 1 tablet by mouth daily. 600-400    . Carbidopa-Levodopa ER (RYTARY)  36.25-145 MG CPCR Take 1 tablet by mouth 4 (four) times daily. 120 capsule 0  . docusate sodium (COLACE) 100 MG capsule Take 1 capsule (100 mg total) by mouth daily. 30 capsule 0  . levothyroxine (SYNTHROID, LEVOTHROID) 88 MCG tablet Take 1 tablet (88 mcg total) by mouth daily. 30 tablet 0  . mirabegron ER (MYRBETRIQ) 25 MG TB24 tablet Take 1 tablet (25 mg total) by mouth daily. 30 tablet 0  . traMADol (ULTRAM) 50 MG tablet Take 1 tablet (50 mg total) by mouth every 6 (six) hours as needed. 28 tablet 0  . vitamin B-12 (CYANOCOBALAMIN) 1000 MCG tablet Take 1 tablet (1,000 mcg total) by mouth daily. 30 tablet 0   No current facility-administered medications on file prior to visit.     Past Medical History:  Diagnosis Date  . Abnormal Pap smear of vagina 09/2006   Dr Nori Riis  . Abnormality of gait 05/24/2015   PMH R occipital infarct B12 deficiency H/o polio affected left leg  In Physical Therapy to improve balance & strength through [facility]  . Arthritis 12/25/2015  . Cervicalgia 01/17/2009   MRI 03/27/16:  IMPRESSION: 1. At C3-4 there is a mild broad-based disc bulge. Moderate bilateral facet arthropathy and uncovertebral degenerative changes resulting in moderate bilateral foraminal stenosis. 2. At C5-6 there is a broad-based disc osteophyte complex. Bilateral uncovertebral degenerative changes and facet arthropathy resulting in moderate bilateral foraminal stenosis.  . Fall 09/11/2017  . History of CVA (cerebrovascular accident) 07/20/2012   Chronic R occipital infarct ; stable on CT 05/21/2012 .Dr Jannifer Franklin , Neurology   . History of TIA (transient ischemic attack) 01/26/2008  . HYPERLIPIDEMIA 01/26/2008   NMR Lipoprofile 2009: LDL 135 (1704/911), HDL 62, TG 131. LDL goal = < 100; ideally < 70. MGF MI in 71s No FH CVA  . Hypothyroidism   . Intention tremor 12/08/2013   Onset after fall 04/2012   . Irritable bowel syndrome (IBS) 01/17/2009   Dr. Unice Cobble  . Osteopenia    PMH fracture heel,  wrist  . Parkinson disease ([place]) 06/25/2016   Dr Tat  . Pernicious anemia   . Post-polio syndrome    minor problems L leg  . Restless leg syndrome 12/25/2015   Dr Tat  . Subdural hygroma 09/11/2017  . Swallowing disorder 03/04/2017  . Urinary incontinence 09/07/2008   Dr McDiarmid, Urology   . Vitamin B12 deficiency   . Weakness 06/24/2017    Past Surgical History:  Procedure Laterality Date  . BLADDER SUSPENSION  2000  . CATARACT EXTRACTION, BILATERAL     Dr. Gershon Crane  . no colonoscopy     "I never felt I needed one "  . YAG LASER APPLICATION Left    Dr. Gershon Crane    Social History   Socioeconomic History  . Marital status: Single    Spouse name: Not on file  . Number  of children: Not on file  . Years of education: Not on file  . Highest education level: Not on file  Occupational History  . Not on file  Social Needs  . Financial resource strain: Not on file  . Food insecurity:    Worry: Not on file    Inability: Not on file  . Transportation needs:    Medical: Not on file    Non-medical: Not on file  Tobacco Use  . Smoking status: Never Smoker  . Smokeless tobacco: Never Used  Substance and Sexual Activity  . Alcohol use: Yes    Alcohol/week: 1.0 standard drinks    Types: 1 Glasses of wine per week    Comment: 0.6 oz per week  . Drug use: No  . Sexual activity: Not on file  Lifestyle  . Physical activity:    Days per week: Not on file    Minutes per session: Not on file  . Stress: Not on file  Relationships  . Social connections:    Talks on phone: Not on file    Gets together: Not on file    Attends religious service: Not on file    Active member of club or organization: Not on file    Attends meetings of clubs or organizations: Not on file    Relationship status: Not on file  Other Topics Concern  . Not on file  Social History Narrative  . Not on file    Family History  Problem Relation Age of Onset  . Heart attack Maternal Grandfather        70s   . Emphysema Mother   . Hypertension Mother   . Alzheimer's disease Father   . Breast cancer Maternal Grandmother   . Cancer Maternal Grandmother        breast  . Diabetes Neg Hx   . Stroke Neg Hx   . Parkinson's disease Neg Hx     Review of Systems  Constitutional: Negative for chills and fever.  Respiratory: Negative for cough, shortness of breath and wheezing.   Cardiovascular: Negative for chest pain, palpitations and leg swelling.  Gastrointestinal: Positive for constipation (controlled with stool softener).  Musculoskeletal: Positive for arthralgias, back pain and myalgias.  Neurological: Negative for light-headedness and headaches.  Psychiatric/Behavioral: Negative for dysphoric mood. The patient is nervous/anxious.        Objective:   Vitals:   01/21/19 1005  BP: (!) 170/82  Pulse: 81  Resp: 16  Temp: 97.7 F (36.5 C)  SpO2: 99%   BP Readings from Last 3 Encounters:  01/21/19 (!) 170/82  01/10/19 130/70  01/08/19 132/69   Wt Readings from Last 3 Encounters:  01/21/19 78 lb (35.4 kg)  01/10/19 83 lb (37.6 kg)  01/08/19 83 lb (37.6 kg)   Body mass index is 15.23 kg/m.   Physical Exam    Constitutional: elderly, frail female. No distress.  HENT:  Head: Normocephalic and atraumatic.  Neck: Neck supple. No tracheal deviation present. No thyromegaly present.  No cervical lymphadenopathy Cardiovascular: Normal rate, regular rhythm and normal heart sounds.   No murmur heard. No carotid bruit .  No edema Pulmonary/Chest: Effort normal and breath sounds normal. No respiratory distress. No has no wheezes. No rales.  Abdomen: soft, NT, ND Skin: Skin is warm and dry. Not diaphoretic.  Psychiatric: Normal mood and affect. Behavior is normal.      Assessment & Plan:    See Problem List for Assessment and Plan of chronic  medical problems.

## 2019-01-21 ENCOUNTER — Ambulatory Visit: Payer: Medicare Other | Admitting: Internal Medicine

## 2019-01-21 ENCOUNTER — Encounter: Payer: Self-pay | Admitting: Internal Medicine

## 2019-01-21 VITALS — BP 170/82 | HR 81 | Temp 97.7°F | Resp 16 | Ht 60.0 in | Wt 78.0 lb

## 2019-01-21 DIAGNOSIS — G20 Parkinson's disease: Secondary | ICD-10-CM | POA: Diagnosis not present

## 2019-01-21 DIAGNOSIS — E034 Atrophy of thyroid (acquired): Secondary | ICD-10-CM

## 2019-01-21 DIAGNOSIS — M4306 Spondylolysis, lumbar region: Secondary | ICD-10-CM | POA: Diagnosis not present

## 2019-01-21 DIAGNOSIS — K5900 Constipation, unspecified: Secondary | ICD-10-CM

## 2019-01-21 DIAGNOSIS — S32030D Wedge compression fracture of third lumbar vertebra, subsequent encounter for fracture with routine healing: Secondary | ICD-10-CM | POA: Diagnosis not present

## 2019-01-21 DIAGNOSIS — F419 Anxiety disorder, unspecified: Secondary | ICD-10-CM

## 2019-01-21 DIAGNOSIS — W19XXXD Unspecified fall, subsequent encounter: Secondary | ICD-10-CM | POA: Diagnosis not present

## 2019-01-21 NOTE — Assessment & Plan Note (Addendum)
Secondary to a fall in November 2019  - stable on recent imaging Taking calcium and vitamin d Would like to see Dr. Henreitta Leber ordered Have discussed medication for osteoporosis in the past, but she is declined Doing PT/OT

## 2019-01-21 NOTE — Patient Instructions (Addendum)
Take tylenol 1000 mg three times.    Take the tramadol 2-3 times a day as needed.     A referral was ordered for Dr Ellene Route.    Think about a medication for anxiety.

## 2019-01-22 DIAGNOSIS — K5900 Constipation, unspecified: Secondary | ICD-10-CM | POA: Insufficient documentation

## 2019-01-22 NOTE — Assessment & Plan Note (Signed)
Recurrent falls Uses walker Is doing home PT/OT

## 2019-01-22 NOTE — Assessment & Plan Note (Signed)
Continue levothyroxine at current dose.

## 2019-01-22 NOTE — Assessment & Plan Note (Signed)
Has generalized anxiety Not currently on any medication Discussed starting a daily SSRI-she would like to think about it

## 2019-01-22 NOTE — Assessment & Plan Note (Signed)
Stable, following with neurology.  

## 2019-01-22 NOTE — Assessment & Plan Note (Signed)
Currently controlled with a stool softener a day Discussed that if she does need to take more tramadol her constipation may worsen and she may need to increase stool softeners or take something like MiraLAX in addition to the stool softeners

## 2019-01-22 NOTE — Assessment & Plan Note (Signed)
Pars fracture involving L3 with most recent fall To follow-up with neurosurgery-would like to see Dr. Ellene Route Pain management with tramadol at night Advised to take 1000 mg of Tylenol 3 times a day If pain is not controlled can take tramadol 1-2 times a day, but only take for more severe pain

## 2019-01-25 DIAGNOSIS — R350 Frequency of micturition: Secondary | ICD-10-CM | POA: Diagnosis not present

## 2019-01-31 DIAGNOSIS — E034 Atrophy of thyroid (acquired): Secondary | ICD-10-CM

## 2019-01-31 DIAGNOSIS — D181 Lymphangioma, any site: Secondary | ICD-10-CM

## 2019-01-31 DIAGNOSIS — F419 Anxiety disorder, unspecified: Secondary | ICD-10-CM

## 2019-01-31 DIAGNOSIS — M8580 Other specified disorders of bone density and structure, unspecified site: Secondary | ICD-10-CM

## 2019-01-31 DIAGNOSIS — G14 Postpolio syndrome: Secondary | ICD-10-CM

## 2019-01-31 DIAGNOSIS — M47812 Spondylosis without myelopathy or radiculopathy, cervical region: Secondary | ICD-10-CM

## 2019-01-31 DIAGNOSIS — M1612 Unilateral primary osteoarthritis, left hip: Secondary | ICD-10-CM

## 2019-01-31 DIAGNOSIS — M4316 Spondylolisthesis, lumbar region: Secondary | ICD-10-CM | POA: Diagnosis not present

## 2019-01-31 DIAGNOSIS — Z8673 Personal history of transient ischemic attack (TIA), and cerebral infarction without residual deficits: Secondary | ICD-10-CM

## 2019-01-31 DIAGNOSIS — Z9181 History of falling: Secondary | ICD-10-CM

## 2019-01-31 DIAGNOSIS — D510 Vitamin B12 deficiency anemia due to intrinsic factor deficiency: Secondary | ICD-10-CM

## 2019-01-31 DIAGNOSIS — N3281 Overactive bladder: Secondary | ICD-10-CM

## 2019-01-31 DIAGNOSIS — R32 Unspecified urinary incontinence: Secondary | ICD-10-CM

## 2019-01-31 DIAGNOSIS — K581 Irritable bowel syndrome with constipation: Secondary | ICD-10-CM

## 2019-01-31 DIAGNOSIS — M9971 Connective tissue and disc stenosis of intervertebral foramina of cervical region: Secondary | ICD-10-CM

## 2019-01-31 DIAGNOSIS — E440 Moderate protein-calorie malnutrition: Secondary | ICD-10-CM

## 2019-01-31 DIAGNOSIS — N390 Urinary tract infection, site not specified: Secondary | ICD-10-CM

## 2019-01-31 DIAGNOSIS — G2581 Restless legs syndrome: Secondary | ICD-10-CM

## 2019-01-31 DIAGNOSIS — F0280 Dementia in other diseases classified elsewhere without behavioral disturbance: Secondary | ICD-10-CM | POA: Diagnosis not present

## 2019-01-31 DIAGNOSIS — G20 Parkinson's disease: Secondary | ICD-10-CM | POA: Diagnosis not present

## 2019-01-31 DIAGNOSIS — G319 Degenerative disease of nervous system, unspecified: Secondary | ICD-10-CM

## 2019-01-31 DIAGNOSIS — E785 Hyperlipidemia, unspecified: Secondary | ICD-10-CM

## 2019-01-31 DIAGNOSIS — M8008XD Age-related osteoporosis with current pathological fracture, vertebra(e), subsequent encounter for fracture with routine healing: Secondary | ICD-10-CM | POA: Diagnosis not present

## 2019-02-02 ENCOUNTER — Ambulatory Visit: Payer: Medicare Other | Admitting: Podiatry

## 2019-02-18 DIAGNOSIS — M8008XD Age-related osteoporosis with current pathological fracture, vertebra(e), subsequent encounter for fracture with routine healing: Secondary | ICD-10-CM | POA: Diagnosis not present

## 2019-02-21 NOTE — Progress Notes (Signed)
Error

## 2019-02-22 ENCOUNTER — Other Ambulatory Visit: Payer: Self-pay

## 2019-02-22 ENCOUNTER — Telehealth (INDEPENDENT_AMBULATORY_CARE_PROVIDER_SITE_OTHER): Payer: Medicare Other | Admitting: Neurology

## 2019-02-22 ENCOUNTER — Telehealth: Payer: Self-pay | Admitting: Neurology

## 2019-02-22 NOTE — Telephone Encounter (Signed)
Pt scheduled yesterday for a phone visit today.  Pt was called x 2 during the scheduled time (once by MA and once by physician).  Messages left both times.  Pt did not answer.  Visit not able to be completed.

## 2019-02-23 ENCOUNTER — Telehealth: Payer: Self-pay

## 2019-02-23 NOTE — Telephone Encounter (Signed)
Should ideally have urology deal with it as he is doing.

## 2019-02-23 NOTE — Telephone Encounter (Signed)
Nephew is aware.

## 2019-02-23 NOTE — Telephone Encounter (Signed)
Copied from [place] [id number]. Topic: Appointment Scheduling - Scheduling Inquiry for Clinic >> Feb 23, 2019  9:24 AM Burchel, Abbi R wrote: Reason for CRM:   Pt's nephew Heron Sabins) states he suspects pt may have UTI.  She is anxious and she is hallucinating.  Joey would like to know if she needs a visit, or some abx called in.  Please advise.   [phone] >> Feb 23, 2019 10:35 AM Morphies, Isidoro Donning wrote: Left message for patient's nephew to call back to schedule a virtual visit if possible. We will also need a urine sample taken to the lab (see Peyton Najjar for more information on this) Please transfer call to the office to schedule this appointment with Dr Quay Burow.  >> Feb 23, 2019 11:14 AM Para Skeans A wrote: Spoke with son, patient is incontinence and would be unable to bring a sample. But she had just saw her urologist for this problem. He is going to call them and see what they can do. He will let us know.

## 2019-02-24 DIAGNOSIS — N390 Urinary tract infection, site not specified: Secondary | ICD-10-CM | POA: Diagnosis not present

## 2019-03-08 ENCOUNTER — Ambulatory Visit: Payer: Medicare Other | Admitting: Neurology

## 2019-03-21 ENCOUNTER — Telehealth: Payer: Self-pay | Admitting: *Deleted

## 2019-03-21 ENCOUNTER — Other Ambulatory Visit: Payer: Self-pay

## 2019-03-21 ENCOUNTER — Telehealth: Payer: Self-pay | Admitting: Neurology

## 2019-03-21 ENCOUNTER — Encounter: Payer: Self-pay | Admitting: Internal Medicine

## 2019-03-21 ENCOUNTER — Ambulatory Visit (INDEPENDENT_AMBULATORY_CARE_PROVIDER_SITE_OTHER): Payer: Medicare Other | Admitting: Internal Medicine

## 2019-03-21 DIAGNOSIS — L819 Disorder of pigmentation, unspecified: Secondary | ICD-10-CM | POA: Diagnosis not present

## 2019-03-21 NOTE — Telephone Encounter (Signed)
Can she do a virtual visit?

## 2019-03-21 NOTE — Telephone Encounter (Signed)
Patient called regarding her restless Legs and her Parkinson's Disease. She would like to speak with someone regarding that. Please Call. Thanks

## 2019-03-21 NOTE — Telephone Encounter (Signed)
As she and I have discussed before, I cannot restart that medication due to side effects in her/this age group.  She also had a telemed visit on 3/31 with me that she missed.

## 2019-03-21 NOTE — Progress Notes (Signed)
Virtual Visit via Video Note  I connected with Elizabeth [name] on 03/21/19 at  3:30 PM EDT by a video enabled telemedicine application and verified that I am speaking with the correct person using two identifiers.   I discussed the limitations of evaluation and management by telemedicine and the availability of in person appointments. The patient expressed understanding and agreed to proceed.  The patient is currently at home and I am in the office.    No referring provider.    History of Present Illness: This is an acute visit for leg warmth.  She states her friend is over and was helping her put her socks on and noticed that her right foot felt hot compared to the left foot.  She states she does not have any pain in the foot and does not feel anything.  Her friend is there currently and when I spoke with her she stated that her right foot no longer felt hot-it felt normal temperature.  She did state some darkening of her skin near her toes.  The patient does not see anything wrong with her feet.  She does have chronic deformities of her feet, but denies any changes.  The friend states she has not seen her feet before, just that she noticed this discoloration on the right foot compared to the left foot.  She has not had any fevers or chills.  She does follow with a podiatrist and last saw him in January/2020.  There is no swelling.     Social History   Socioeconomic History  . Marital status: Single    Spouse name: Not on file  . Number of children: Not on file  . Years of education: Not on file  . Highest education level: Not on file  Occupational History  . Not on file  Social Needs  . Financial resource strain: Not on file  . Food insecurity:    Worry: Not on file    Inability: Not on file  . Transportation needs:    Medical: Not on file    Non-medical: Not on file  Tobacco Use  . Smoking status: Never Smoker  . Smokeless tobacco: Never Used  Substance and Sexual Activity  .  Alcohol use: Yes    Alcohol/week: 1.0 standard drinks    Types: 1 Glasses of wine per week    Comment: 0.6 oz per week  . Drug use: No  . Sexual activity: Not on file  Lifestyle  . Physical activity:    Days per week: Not on file    Minutes per session: Not on file  . Stress: Not on file  Relationships  . Social connections:    Talks on phone: Not on file    Gets together: Not on file    Attends religious service: Not on file    Active member of club or organization: Not on file    Attends meetings of clubs or organizations: Not on file    Relationship status: Not on file  Other Topics Concern  . Not on file  Social History Narrative  . Not on file     Observations/Objective: Appears well in NAD Unable to visualize feet due to poor quality video that was frequently freezing and at one point we lost video connection.  Assessment and Plan:  See Problem List for Assessment and Plan of chronic medical problems.   Follow Up Instructions:    I discussed the assessment and treatment plan with the patient. The  patient was provided an opportunity to ask questions and all were answered. The patient agreed with the plan and demonstrated an understanding of the instructions.   The patient was advised to call back or seek an in-person evaluation if the symptoms worsen or if the condition fails to improve as anticipated.    Binnie Rail, MD

## 2019-03-21 NOTE — Telephone Encounter (Signed)
I spoke to pt. She agrees to virtual visit today.  Virtual visit 03/21/19 @ 3:30pm.

## 2019-03-21 NOTE — Assessment & Plan Note (Signed)
Mild discoloration of right foot her friend that just saw her feet for the first time.  She denies any pain and now the temperature is normal.  Her friend first felt that the foot was hot Without pain, warmth or any other symptoms will not evaluate further or provide any treatment Unfortunately I am not able to visualize her foot via video so at this point I just recommended monitoring closely.  If her discoloration gets worse or she has any other symptoms she will need to be seen in person either by myself or podiatry to evaluate further  They agree with plan

## 2019-03-21 NOTE — Telephone Encounter (Signed)
Copied from [place] [id number]. Topic: General - Inquiry >> Mar 21, 2019 11:24 AM Virl Axe D wrote: Reason for CRM: Pt stated that her right leg has a warm sensation. It was not in pain or swollen. She has been taking Tylenol but would like to know if there is anything else she can take. Please advise.

## 2019-03-21 NOTE — Telephone Encounter (Signed)
Contacted the patient and she stated that she is having a problem with her Restless Leg Syndrome. She stated she was on medication about a year ago and would like to be placed on the same medication, pramipexole.

## 2019-03-21 NOTE — Telephone Encounter (Signed)
Tried to contact the patient on her cellular  no answer left voice message for her to call

## 2019-03-22 ENCOUNTER — Telehealth: Payer: Self-pay | Admitting: Internal Medicine

## 2019-03-22 NOTE — Telephone Encounter (Signed)
Gave ok for orders per Dr. Burns.  

## 2019-03-22 NOTE — Telephone Encounter (Signed)
Called pt--to let her know Dr. Carles Collet discuss the medication before cannot start medication because side effect due her age group and agreed to make an Virtual/phone  Appointment with Dr. Carles Collet  regarding medication.

## 2019-03-22 NOTE — Telephone Encounter (Signed)
Copied from [place] [id number]. Topic: Quick Communication - Home Health Verbal Orders >> Mar 22, 2019 11:27 AM Rayann Heman wrote: Caller/Agency: [facility]  Callback Number:[phone] Requesting PT  Frequency:restart care

## 2019-03-24 DIAGNOSIS — K581 Irritable bowel syndrome with constipation: Secondary | ICD-10-CM | POA: Diagnosis not present

## 2019-03-24 DIAGNOSIS — M8580 Other specified disorders of bone density and structure, unspecified site: Secondary | ICD-10-CM | POA: Diagnosis not present

## 2019-03-24 DIAGNOSIS — M4316 Spondylolisthesis, lumbar region: Secondary | ICD-10-CM | POA: Diagnosis not present

## 2019-03-24 DIAGNOSIS — E034 Atrophy of thyroid (acquired): Secondary | ICD-10-CM | POA: Diagnosis not present

## 2019-03-24 DIAGNOSIS — G2581 Restless legs syndrome: Secondary | ICD-10-CM | POA: Diagnosis not present

## 2019-03-24 DIAGNOSIS — Z8673 Personal history of transient ischemic attack (TIA), and cerebral infarction without residual deficits: Secondary | ICD-10-CM | POA: Diagnosis not present

## 2019-03-24 DIAGNOSIS — E785 Hyperlipidemia, unspecified: Secondary | ICD-10-CM | POA: Diagnosis not present

## 2019-03-24 DIAGNOSIS — M47812 Spondylosis without myelopathy or radiculopathy, cervical region: Secondary | ICD-10-CM | POA: Diagnosis not present

## 2019-03-24 DIAGNOSIS — Z9181 History of falling: Secondary | ICD-10-CM | POA: Diagnosis not present

## 2019-03-24 DIAGNOSIS — M810 Age-related osteoporosis without current pathological fracture: Secondary | ICD-10-CM | POA: Diagnosis not present

## 2019-03-24 DIAGNOSIS — G20 Parkinson's disease: Secondary | ICD-10-CM | POA: Diagnosis not present

## 2019-03-24 DIAGNOSIS — E440 Moderate protein-calorie malnutrition: Secondary | ICD-10-CM | POA: Diagnosis not present

## 2019-03-24 DIAGNOSIS — M1612 Unilateral primary osteoarthritis, left hip: Secondary | ICD-10-CM | POA: Diagnosis not present

## 2019-03-24 DIAGNOSIS — D181 Lymphangioma, any site: Secondary | ICD-10-CM | POA: Diagnosis not present

## 2019-03-24 DIAGNOSIS — D510 Vitamin B12 deficiency anemia due to intrinsic factor deficiency: Secondary | ICD-10-CM | POA: Diagnosis not present

## 2019-03-24 DIAGNOSIS — M216X9 Other acquired deformities of unspecified foot: Secondary | ICD-10-CM | POA: Diagnosis not present

## 2019-03-25 NOTE — Telephone Encounter (Signed)
PT verbal orders needed for 2x wk for 4wks

## 2019-03-25 NOTE — Telephone Encounter (Signed)
Gave verbal orders. 

## 2019-03-28 ENCOUNTER — Ambulatory Visit: Payer: Medicare Other | Admitting: Neurology

## 2019-03-28 ENCOUNTER — Encounter: Payer: Self-pay | Admitting: *Deleted

## 2019-03-28 ENCOUNTER — Other Ambulatory Visit: Payer: Self-pay

## 2019-03-28 DIAGNOSIS — E440 Moderate protein-calorie malnutrition: Secondary | ICD-10-CM | POA: Diagnosis not present

## 2019-03-28 DIAGNOSIS — Z8673 Personal history of transient ischemic attack (TIA), and cerebral infarction without residual deficits: Secondary | ICD-10-CM | POA: Diagnosis not present

## 2019-03-28 DIAGNOSIS — Z9181 History of falling: Secondary | ICD-10-CM | POA: Diagnosis not present

## 2019-03-28 DIAGNOSIS — G20 Parkinson's disease: Secondary | ICD-10-CM

## 2019-03-28 DIAGNOSIS — G2581 Restless legs syndrome: Secondary | ICD-10-CM | POA: Diagnosis not present

## 2019-03-28 DIAGNOSIS — M4316 Spondylolisthesis, lumbar region: Secondary | ICD-10-CM | POA: Diagnosis not present

## 2019-03-28 DIAGNOSIS — E034 Atrophy of thyroid (acquired): Secondary | ICD-10-CM | POA: Diagnosis not present

## 2019-03-28 DIAGNOSIS — M810 Age-related osteoporosis without current pathological fracture: Secondary | ICD-10-CM | POA: Diagnosis not present

## 2019-03-28 DIAGNOSIS — M8580 Other specified disorders of bone density and structure, unspecified site: Secondary | ICD-10-CM | POA: Diagnosis not present

## 2019-03-28 DIAGNOSIS — M1612 Unilateral primary osteoarthritis, left hip: Secondary | ICD-10-CM | POA: Diagnosis not present

## 2019-03-28 DIAGNOSIS — D510 Vitamin B12 deficiency anemia due to intrinsic factor deficiency: Secondary | ICD-10-CM | POA: Diagnosis not present

## 2019-03-28 DIAGNOSIS — E785 Hyperlipidemia, unspecified: Secondary | ICD-10-CM | POA: Diagnosis not present

## 2019-03-28 DIAGNOSIS — K581 Irritable bowel syndrome with constipation: Secondary | ICD-10-CM | POA: Diagnosis not present

## 2019-03-28 DIAGNOSIS — M216X9 Other acquired deformities of unspecified foot: Secondary | ICD-10-CM | POA: Diagnosis not present

## 2019-03-28 DIAGNOSIS — M47812 Spondylosis without myelopathy or radiculopathy, cervical region: Secondary | ICD-10-CM | POA: Diagnosis not present

## 2019-03-28 DIAGNOSIS — D181 Lymphangioma, any site: Secondary | ICD-10-CM | POA: Diagnosis not present

## 2019-03-28 NOTE — Progress Notes (Signed)
Virtual Visit via Telephone Note The purpose of this virtual visit is to provide medical care while limiting exposure to the novel coronavirus.    Consent was obtained for phone visit:  Yes.   Answered questions that patient had about telehealth interaction:  Yes.   I discussed the limitations, risks, security and privacy concerns of performing an evaluation and management service by telephone. I also discussed with the patient that there may be a patient responsible charge related to this service. The patient expressed understanding and agreed to proceed.  Pt location: Home Physician Location: office Name of referring provider:  Binnie Rail, MD I connected with .Drucie Ip Pogorzelski at patients initiation/request on 03/30/2019 at 11:00 AM EDT by telephone and verified that I am speaking with the correct person using two identifiers.  Pt MRN:  [id number] Pt DOB:  Oct 13, 1931  Participants:  Drucie Ip Neidlinger; lynn (friend)   History of Present Illness:  Patient seen today in follow-up for Parkinson's disease.  She missed her last visit in March.  She is supposed to be on Rytary, 145 mg, 2 tablets in the morning, 2 in the afternoon and 1 in the evening.  Last visit, she was only taking 1 tablet 3 times per day.  Today, she states that she is taking 1 tablet four times per day (apparently ordered by a rehab doctor).  She called me recently and wanted me to restart her pramipexole, but I declined that given that she has had memory change.  [facility] social work referral was made last visit because of concern about patient's ability to manage her own meds and live on her own and because the patient told them that she was doing well and does not need assistance, they closed out the case.  Records are reviewed since last visit.  Patient was in the hospital at the end of January after 2 falls, resulting in a pars fracture involving the L3 along with compression fracture involving L3, which was stable.  No surgery was  required. The patient was on Ultram.  The patient's nephew did call primary care on February 23, 2019 with hallucinations and thought the patient could have had a urinary tract infection.  She recommended that the patient follow-up with urology.  Her friend (who speaks most of the visit) admits to continued hallucinations.  Her friend admits to confusion.  Caregivers are in the home 3 hours/day.   Observations/Objective:   [facility]-BLIND done and patient got a 17/22.  Assessment and Plan:   Parkinson's disease with Parkinson's disease dementia  -Patient is to increase Rytary 145 mg, 2 tablets in the morning, 2 in the afternoon and 1 in evening.  We have discussed this the last several visits.  The only reason for the increase this to see if we can control the restless leg.  If hallucinations increase, we will obviously decrease the Rytary.  -Continue to be concerned about her living situation.  Caregivers have previously reported confusion and hallucinations.  Records recently indicate that her nephew called primary care indicating hallucinations, and he thought that it was related to urinary tract infection.  She has been on tramadol recently for her fall resulting in a L3 pars fracture, and I would recommend discontinuing that as that can cause hallucinations.  Spent most of the phone visit discussing this living situation and the fact that she needs 24 hour/day care.  She cannot afford in-home care, and suspect that she needs out of home care.  -  Patient would like to go back on pramipexole for restless leg.  This will just cause more hallucinations and confusion.  Explained again to the patient that we cannot restart this medication.  She was very upset about it and certainly let me know this on the phone.  -Patient should not be living on her own.  Follow Up Instructions:  I would like to see the patient back in the next few months.  She let me know that she did not think she would like to come back to see  me because she did not like what I had to say today about full-time caregiving.  Her friend stated that she agreed with me and agreed with the assessment, and would like the patient to have an appointment in the future.  We will try to make one if she will let us.    -I discussed the assessment and treatment plan with the patient. The patient was provided an opportunity to ask questions and all were answered. The patient agreed with the plan and demonstrated an understanding of the instructions.   The patient was advised to call back or seek an in-person evaluation if the symptoms worsen or if the condition fails to improve as anticipated.    Total Time spent in visit with the patient was:  15 min, of which 100% of the time was spent in counseling and/or coordinating care on home safety.   Pt understands as does caregiving team (friend, lynn was present, as was comfort care worker).   Alonza Bogus, DO

## 2019-03-30 ENCOUNTER — Telehealth (INDEPENDENT_AMBULATORY_CARE_PROVIDER_SITE_OTHER): Payer: Medicare Other | Admitting: Neurology

## 2019-03-30 ENCOUNTER — Encounter: Payer: Self-pay | Admitting: *Deleted

## 2019-03-30 ENCOUNTER — Other Ambulatory Visit: Payer: Self-pay

## 2019-03-30 DIAGNOSIS — M810 Age-related osteoporosis without current pathological fracture: Secondary | ICD-10-CM | POA: Diagnosis not present

## 2019-03-30 DIAGNOSIS — M4316 Spondylolisthesis, lumbar region: Secondary | ICD-10-CM | POA: Diagnosis not present

## 2019-03-30 DIAGNOSIS — G20A1 Parkinson's disease without dyskinesia, without mention of fluctuations: Secondary | ICD-10-CM

## 2019-03-30 DIAGNOSIS — E440 Moderate protein-calorie malnutrition: Secondary | ICD-10-CM | POA: Diagnosis not present

## 2019-03-30 DIAGNOSIS — M216X9 Other acquired deformities of unspecified foot: Secondary | ICD-10-CM | POA: Diagnosis not present

## 2019-03-30 DIAGNOSIS — G20 Parkinson's disease: Secondary | ICD-10-CM

## 2019-03-30 DIAGNOSIS — M47812 Spondylosis without myelopathy or radiculopathy, cervical region: Secondary | ICD-10-CM | POA: Diagnosis not present

## 2019-03-30 DIAGNOSIS — E785 Hyperlipidemia, unspecified: Secondary | ICD-10-CM | POA: Diagnosis not present

## 2019-03-30 DIAGNOSIS — K581 Irritable bowel syndrome with constipation: Secondary | ICD-10-CM | POA: Diagnosis not present

## 2019-03-30 DIAGNOSIS — R441 Visual hallucinations: Secondary | ICD-10-CM

## 2019-03-30 DIAGNOSIS — M8580 Other specified disorders of bone density and structure, unspecified site: Secondary | ICD-10-CM | POA: Diagnosis not present

## 2019-03-30 DIAGNOSIS — E034 Atrophy of thyroid (acquired): Secondary | ICD-10-CM | POA: Diagnosis not present

## 2019-03-30 DIAGNOSIS — M1612 Unilateral primary osteoarthritis, left hip: Secondary | ICD-10-CM | POA: Diagnosis not present

## 2019-03-30 DIAGNOSIS — Z8673 Personal history of transient ischemic attack (TIA), and cerebral infarction without residual deficits: Secondary | ICD-10-CM | POA: Diagnosis not present

## 2019-03-30 DIAGNOSIS — Z9181 History of falling: Secondary | ICD-10-CM | POA: Diagnosis not present

## 2019-03-30 DIAGNOSIS — D510 Vitamin B12 deficiency anemia due to intrinsic factor deficiency: Secondary | ICD-10-CM | POA: Diagnosis not present

## 2019-03-30 DIAGNOSIS — G2581 Restless legs syndrome: Secondary | ICD-10-CM | POA: Diagnosis not present

## 2019-03-30 DIAGNOSIS — D181 Lymphangioma, any site: Secondary | ICD-10-CM | POA: Diagnosis not present

## 2019-04-01 DIAGNOSIS — G14 Postpolio syndrome: Secondary | ICD-10-CM

## 2019-04-01 DIAGNOSIS — E034 Atrophy of thyroid (acquired): Secondary | ICD-10-CM

## 2019-04-01 DIAGNOSIS — Z8673 Personal history of transient ischemic attack (TIA), and cerebral infarction without residual deficits: Secondary | ICD-10-CM

## 2019-04-01 DIAGNOSIS — G2581 Restless legs syndrome: Secondary | ICD-10-CM

## 2019-04-01 DIAGNOSIS — M47812 Spondylosis without myelopathy or radiculopathy, cervical region: Secondary | ICD-10-CM | POA: Diagnosis not present

## 2019-04-01 DIAGNOSIS — D181 Lymphangioma, any site: Secondary | ICD-10-CM

## 2019-04-01 DIAGNOSIS — M810 Age-related osteoporosis without current pathological fracture: Secondary | ICD-10-CM

## 2019-04-01 DIAGNOSIS — G20 Parkinson's disease: Secondary | ICD-10-CM | POA: Diagnosis not present

## 2019-04-01 DIAGNOSIS — M8580 Other specified disorders of bone density and structure, unspecified site: Secondary | ICD-10-CM

## 2019-04-01 DIAGNOSIS — E440 Moderate protein-calorie malnutrition: Secondary | ICD-10-CM

## 2019-04-01 DIAGNOSIS — F0280 Dementia in other diseases classified elsewhere without behavioral disturbance: Secondary | ICD-10-CM | POA: Diagnosis not present

## 2019-04-01 DIAGNOSIS — K581 Irritable bowel syndrome with constipation: Secondary | ICD-10-CM

## 2019-04-01 DIAGNOSIS — F419 Anxiety disorder, unspecified: Secondary | ICD-10-CM

## 2019-04-01 DIAGNOSIS — Z9181 History of falling: Secondary | ICD-10-CM

## 2019-04-01 DIAGNOSIS — M216X9 Other acquired deformities of unspecified foot: Secondary | ICD-10-CM

## 2019-04-01 DIAGNOSIS — D510 Vitamin B12 deficiency anemia due to intrinsic factor deficiency: Secondary | ICD-10-CM

## 2019-04-01 DIAGNOSIS — E785 Hyperlipidemia, unspecified: Secondary | ICD-10-CM

## 2019-04-01 DIAGNOSIS — M1612 Unilateral primary osteoarthritis, left hip: Secondary | ICD-10-CM

## 2019-04-01 DIAGNOSIS — M4316 Spondylolisthesis, lumbar region: Secondary | ICD-10-CM | POA: Diagnosis not present

## 2019-04-04 DIAGNOSIS — M47812 Spondylosis without myelopathy or radiculopathy, cervical region: Secondary | ICD-10-CM | POA: Diagnosis not present

## 2019-04-04 DIAGNOSIS — E440 Moderate protein-calorie malnutrition: Secondary | ICD-10-CM | POA: Diagnosis not present

## 2019-04-04 DIAGNOSIS — D181 Lymphangioma, any site: Secondary | ICD-10-CM | POA: Diagnosis not present

## 2019-04-04 DIAGNOSIS — Z9181 History of falling: Secondary | ICD-10-CM | POA: Diagnosis not present

## 2019-04-04 DIAGNOSIS — G20 Parkinson's disease: Secondary | ICD-10-CM | POA: Diagnosis not present

## 2019-04-04 DIAGNOSIS — M8580 Other specified disorders of bone density and structure, unspecified site: Secondary | ICD-10-CM | POA: Diagnosis not present

## 2019-04-04 DIAGNOSIS — M810 Age-related osteoporosis without current pathological fracture: Secondary | ICD-10-CM | POA: Diagnosis not present

## 2019-04-04 DIAGNOSIS — G2581 Restless legs syndrome: Secondary | ICD-10-CM | POA: Diagnosis not present

## 2019-04-04 DIAGNOSIS — M1612 Unilateral primary osteoarthritis, left hip: Secondary | ICD-10-CM | POA: Diagnosis not present

## 2019-04-04 DIAGNOSIS — K581 Irritable bowel syndrome with constipation: Secondary | ICD-10-CM | POA: Diagnosis not present

## 2019-04-04 DIAGNOSIS — E034 Atrophy of thyroid (acquired): Secondary | ICD-10-CM | POA: Diagnosis not present

## 2019-04-04 DIAGNOSIS — E785 Hyperlipidemia, unspecified: Secondary | ICD-10-CM | POA: Diagnosis not present

## 2019-04-04 DIAGNOSIS — D510 Vitamin B12 deficiency anemia due to intrinsic factor deficiency: Secondary | ICD-10-CM | POA: Diagnosis not present

## 2019-04-04 DIAGNOSIS — M4316 Spondylolisthesis, lumbar region: Secondary | ICD-10-CM | POA: Diagnosis not present

## 2019-04-04 DIAGNOSIS — M216X9 Other acquired deformities of unspecified foot: Secondary | ICD-10-CM | POA: Diagnosis not present

## 2019-04-04 DIAGNOSIS — Z8673 Personal history of transient ischemic attack (TIA), and cerebral infarction without residual deficits: Secondary | ICD-10-CM | POA: Diagnosis not present

## 2019-04-08 DIAGNOSIS — M1612 Unilateral primary osteoarthritis, left hip: Secondary | ICD-10-CM | POA: Diagnosis not present

## 2019-04-08 DIAGNOSIS — E034 Atrophy of thyroid (acquired): Secondary | ICD-10-CM | POA: Diagnosis not present

## 2019-04-08 DIAGNOSIS — M47812 Spondylosis without myelopathy or radiculopathy, cervical region: Secondary | ICD-10-CM | POA: Diagnosis not present

## 2019-04-08 DIAGNOSIS — Z8673 Personal history of transient ischemic attack (TIA), and cerebral infarction without residual deficits: Secondary | ICD-10-CM | POA: Diagnosis not present

## 2019-04-08 DIAGNOSIS — G2581 Restless legs syndrome: Secondary | ICD-10-CM | POA: Diagnosis not present

## 2019-04-08 DIAGNOSIS — K581 Irritable bowel syndrome with constipation: Secondary | ICD-10-CM | POA: Diagnosis not present

## 2019-04-08 DIAGNOSIS — E440 Moderate protein-calorie malnutrition: Secondary | ICD-10-CM | POA: Diagnosis not present

## 2019-04-08 DIAGNOSIS — M4316 Spondylolisthesis, lumbar region: Secondary | ICD-10-CM | POA: Diagnosis not present

## 2019-04-08 DIAGNOSIS — M810 Age-related osteoporosis without current pathological fracture: Secondary | ICD-10-CM | POA: Diagnosis not present

## 2019-04-08 DIAGNOSIS — E785 Hyperlipidemia, unspecified: Secondary | ICD-10-CM | POA: Diagnosis not present

## 2019-04-08 DIAGNOSIS — Z9181 History of falling: Secondary | ICD-10-CM | POA: Diagnosis not present

## 2019-04-08 DIAGNOSIS — M8580 Other specified disorders of bone density and structure, unspecified site: Secondary | ICD-10-CM | POA: Diagnosis not present

## 2019-04-08 DIAGNOSIS — D181 Lymphangioma, any site: Secondary | ICD-10-CM | POA: Diagnosis not present

## 2019-04-08 DIAGNOSIS — G20 Parkinson's disease: Secondary | ICD-10-CM | POA: Diagnosis not present

## 2019-04-08 DIAGNOSIS — D510 Vitamin B12 deficiency anemia due to intrinsic factor deficiency: Secondary | ICD-10-CM | POA: Diagnosis not present

## 2019-04-08 DIAGNOSIS — M216X9 Other acquired deformities of unspecified foot: Secondary | ICD-10-CM | POA: Diagnosis not present

## 2019-04-11 ENCOUNTER — Telehealth: Payer: Self-pay | Admitting: Neurology

## 2019-04-11 DIAGNOSIS — E785 Hyperlipidemia, unspecified: Secondary | ICD-10-CM | POA: Diagnosis not present

## 2019-04-11 DIAGNOSIS — E034 Atrophy of thyroid (acquired): Secondary | ICD-10-CM | POA: Diagnosis not present

## 2019-04-11 DIAGNOSIS — M1612 Unilateral primary osteoarthritis, left hip: Secondary | ICD-10-CM | POA: Diagnosis not present

## 2019-04-11 DIAGNOSIS — M8580 Other specified disorders of bone density and structure, unspecified site: Secondary | ICD-10-CM | POA: Diagnosis not present

## 2019-04-11 DIAGNOSIS — M47812 Spondylosis without myelopathy or radiculopathy, cervical region: Secondary | ICD-10-CM | POA: Diagnosis not present

## 2019-04-11 DIAGNOSIS — D181 Lymphangioma, any site: Secondary | ICD-10-CM | POA: Diagnosis not present

## 2019-04-11 DIAGNOSIS — G20 Parkinson's disease: Secondary | ICD-10-CM | POA: Diagnosis not present

## 2019-04-11 DIAGNOSIS — M4316 Spondylolisthesis, lumbar region: Secondary | ICD-10-CM | POA: Diagnosis not present

## 2019-04-11 DIAGNOSIS — M810 Age-related osteoporosis without current pathological fracture: Secondary | ICD-10-CM | POA: Diagnosis not present

## 2019-04-11 DIAGNOSIS — G2581 Restless legs syndrome: Secondary | ICD-10-CM | POA: Diagnosis not present

## 2019-04-11 DIAGNOSIS — M216X9 Other acquired deformities of unspecified foot: Secondary | ICD-10-CM | POA: Diagnosis not present

## 2019-04-11 DIAGNOSIS — D510 Vitamin B12 deficiency anemia due to intrinsic factor deficiency: Secondary | ICD-10-CM | POA: Diagnosis not present

## 2019-04-11 DIAGNOSIS — K581 Irritable bowel syndrome with constipation: Secondary | ICD-10-CM | POA: Diagnosis not present

## 2019-04-11 DIAGNOSIS — E440 Moderate protein-calorie malnutrition: Secondary | ICD-10-CM | POA: Diagnosis not present

## 2019-04-11 DIAGNOSIS — Z8673 Personal history of transient ischemic attack (TIA), and cerebral infarction without residual deficits: Secondary | ICD-10-CM | POA: Diagnosis not present

## 2019-04-11 DIAGNOSIS — Z9181 History of falling: Secondary | ICD-10-CM | POA: Diagnosis not present

## 2019-04-11 NOTE — Telephone Encounter (Signed)
Is her daughter on the DPR?  Didn't even know that she had a daughter (nephew sometimes helps with medical care I think).  If not on DPR, cannot give information and needs to talk with the caregiver who was on the phone that day with me on the visit because this issue was discussed extensively.  If she is on the DPR, then she needs to know that patient needs 24 hour per day, should not be left alone at all and pt was upset last visit at this for discussing that.  If she is still on ultram, then may be contributing to hallucinations as well.

## 2019-04-11 NOTE — Telephone Encounter (Signed)
While the medication for parkinsons may contribute, its likely the dementia that is the biggest issue.  The family doesn't seem to be recognizing that issue because they have never come (DPR reviewed).  This is exactly the reason I would not put her on the pramipexole that she wanted to be on and why she was upset (b/c would cause more hallucinations).  I'm not sure if she is still on the ultram for pain but if she is, can certainly increase hallucinations.  They should discuss with the pcp.

## 2019-04-11 NOTE — Telephone Encounter (Signed)
Maurene Capes and Leroy Sea are the only family members listed on patient DPR. Unable to discuss patient medical history with patient daughter [name] no answer left message with this information.

## 2019-04-11 NOTE — Telephone Encounter (Signed)
Patient's daughter [name] called regarding her mom and the hallucinations and that they are getting worse. Also, her Rytary Rx has not been updated with the new instructions. [facility] is needing that faxed to them please. Thanks

## 2019-04-11 NOTE — Telephone Encounter (Signed)
Please advise  Reviewed your last telemedicine visit on 03/30/19   Rx last filled 01/10/19 by Inocencio Homes. MD  Ok to fill?

## 2019-04-11 NOTE — Telephone Encounter (Signed)
Called spoke with Earlie Server and Heron Sabins [place]  She would like to know if she can reduce the dose of the medication that is causing her to hallucinate? Joey states that patient has off and on hallucinations sometimes she is well and sometimes she is not.  [name] is patient best friends that goes over to see her and help out. He will contact [name] for updates on patient and call back with any additional information

## 2019-04-12 ENCOUNTER — Telehealth: Payer: Self-pay | Admitting: Internal Medicine

## 2019-04-12 ENCOUNTER — Telehealth: Payer: Self-pay | Admitting: Neurology

## 2019-04-12 NOTE — Telephone Encounter (Signed)
You were not the last one to prescribe medication. Please advise

## 2019-04-12 NOTE — Telephone Encounter (Signed)
Caller name: Zenovia Jarred  Relation to pt: friend   Reason for call:  Patient currently playing scramble and would like a follow up call after 4pm. Friend calling on behalf of patient stating patient has not taken Tramadol in 2 month due to medication making her feel loopy and dizzy. Friend states PCP and neurologist have spoken and friend states Carbidopa-Levodopa ER (RYTARY) 36.25-145 MG CPCR  prescribed by neurologist is causing her to hallucinate, please advise patient after 4pm, please advise

## 2019-04-12 NOTE — Telephone Encounter (Signed)
Pt called and would like a refill of her Tramadol, her Parkinsons doctor stated she feels it is too strong though. Please advise, patient said she can do a telephone call if need be.

## 2019-04-12 NOTE — Telephone Encounter (Signed)
I don't prescribe her tramadol.  I don't recommend it for her but I don't prescribe it for her.

## 2019-04-12 NOTE — Telephone Encounter (Signed)
I don't think that she means pramipexole.  No.  As I have told many times, this medication makes the hallucinations.

## 2019-04-12 NOTE — Telephone Encounter (Signed)
Pt would like to know what else she can take besides tylenol. She stated that she "does not like tylenol". She asked again for a refill of tramadol. I advised patient that she was not going to get tramadol because Dr. Quay Burow already stated there were too many side effects.

## 2019-04-12 NOTE — Telephone Encounter (Signed)
I do not think she should take the tramadol.  This can cause too many side effects.  I would like her to try tylenol 1000mg  three times a day

## 2019-04-12 NOTE — Telephone Encounter (Signed)
Called patient she is aware and understands provider message below

## 2019-04-12 NOTE — Telephone Encounter (Signed)
Called spoke with Earlie Server and Heron Sabins Gal[place] they are aware and understands will discuss with [name] to call PCP to discuss changes with medication.

## 2019-04-12 NOTE — Telephone Encounter (Signed)
Patient is calling needing to speak with you regarding her Primidone medication. She said that Dr. Carles Collet had spoke with Dr. Quay Burow. Please Call. Thanks

## 2019-04-12 NOTE — Telephone Encounter (Signed)
Called spoke with patient she is requesting a refill on medication below Unable to find on current medication list. Not mention in VV note on 03/30/19 Please advise  Pt seem to be very confused at one point she thought she was speaking with someone in Dr.  Quay Burow office requesting refill for TRAMADOL. She states that tramadol is helping her with her pain and do not want to stop taken.

## 2019-04-12 NOTE — Telephone Encounter (Signed)
See other portion of note regarding Primidone

## 2019-04-13 DIAGNOSIS — G2581 Restless legs syndrome: Secondary | ICD-10-CM | POA: Diagnosis not present

## 2019-04-13 DIAGNOSIS — M8580 Other specified disorders of bone density and structure, unspecified site: Secondary | ICD-10-CM | POA: Diagnosis not present

## 2019-04-13 DIAGNOSIS — M4316 Spondylolisthesis, lumbar region: Secondary | ICD-10-CM | POA: Diagnosis not present

## 2019-04-13 DIAGNOSIS — E785 Hyperlipidemia, unspecified: Secondary | ICD-10-CM | POA: Diagnosis not present

## 2019-04-13 DIAGNOSIS — M47812 Spondylosis without myelopathy or radiculopathy, cervical region: Secondary | ICD-10-CM | POA: Diagnosis not present

## 2019-04-13 DIAGNOSIS — E034 Atrophy of thyroid (acquired): Secondary | ICD-10-CM | POA: Diagnosis not present

## 2019-04-13 DIAGNOSIS — M216X9 Other acquired deformities of unspecified foot: Secondary | ICD-10-CM | POA: Diagnosis not present

## 2019-04-13 DIAGNOSIS — M810 Age-related osteoporosis without current pathological fracture: Secondary | ICD-10-CM | POA: Diagnosis not present

## 2019-04-13 DIAGNOSIS — E440 Moderate protein-calorie malnutrition: Secondary | ICD-10-CM | POA: Diagnosis not present

## 2019-04-13 DIAGNOSIS — Z8673 Personal history of transient ischemic attack (TIA), and cerebral infarction without residual deficits: Secondary | ICD-10-CM | POA: Diagnosis not present

## 2019-04-13 DIAGNOSIS — M1612 Unilateral primary osteoarthritis, left hip: Secondary | ICD-10-CM | POA: Diagnosis not present

## 2019-04-13 DIAGNOSIS — D510 Vitamin B12 deficiency anemia due to intrinsic factor deficiency: Secondary | ICD-10-CM | POA: Diagnosis not present

## 2019-04-13 DIAGNOSIS — Z9181 History of falling: Secondary | ICD-10-CM | POA: Diagnosis not present

## 2019-04-13 DIAGNOSIS — G20 Parkinson's disease: Secondary | ICD-10-CM | POA: Diagnosis not present

## 2019-04-13 DIAGNOSIS — D181 Lymphangioma, any site: Secondary | ICD-10-CM | POA: Diagnosis not present

## 2019-04-13 DIAGNOSIS — K581 Irritable bowel syndrome with constipation: Secondary | ICD-10-CM | POA: Diagnosis not present

## 2019-04-13 NOTE — Addendum Note (Signed)
Addended by: Binnie Rail on: 04/13/2019 11:02 AM   Modules accepted: Orders

## 2019-04-13 NOTE — Telephone Encounter (Signed)
Pt aware of response.  

## 2019-04-13 NOTE — Telephone Encounter (Signed)
She needs to try the tylenol

## 2019-04-15 ENCOUNTER — Telehealth: Payer: Self-pay | Admitting: Neurology

## 2019-04-15 NOTE — Telephone Encounter (Signed)
Patient left VM wanting to speak with the nurse. Please call her back at [phone]. Thanks!

## 2019-04-19 DIAGNOSIS — M8580 Other specified disorders of bone density and structure, unspecified site: Secondary | ICD-10-CM | POA: Diagnosis not present

## 2019-04-19 DIAGNOSIS — E440 Moderate protein-calorie malnutrition: Secondary | ICD-10-CM | POA: Diagnosis not present

## 2019-04-19 DIAGNOSIS — D510 Vitamin B12 deficiency anemia due to intrinsic factor deficiency: Secondary | ICD-10-CM | POA: Diagnosis not present

## 2019-04-19 DIAGNOSIS — E034 Atrophy of thyroid (acquired): Secondary | ICD-10-CM | POA: Diagnosis not present

## 2019-04-19 DIAGNOSIS — Z8673 Personal history of transient ischemic attack (TIA), and cerebral infarction without residual deficits: Secondary | ICD-10-CM | POA: Diagnosis not present

## 2019-04-19 DIAGNOSIS — G20 Parkinson's disease: Secondary | ICD-10-CM | POA: Diagnosis not present

## 2019-04-19 DIAGNOSIS — M1612 Unilateral primary osteoarthritis, left hip: Secondary | ICD-10-CM | POA: Diagnosis not present

## 2019-04-19 DIAGNOSIS — K581 Irritable bowel syndrome with constipation: Secondary | ICD-10-CM | POA: Diagnosis not present

## 2019-04-19 DIAGNOSIS — M810 Age-related osteoporosis without current pathological fracture: Secondary | ICD-10-CM | POA: Diagnosis not present

## 2019-04-19 DIAGNOSIS — Z9181 History of falling: Secondary | ICD-10-CM | POA: Diagnosis not present

## 2019-04-19 DIAGNOSIS — M216X9 Other acquired deformities of unspecified foot: Secondary | ICD-10-CM | POA: Diagnosis not present

## 2019-04-19 DIAGNOSIS — E785 Hyperlipidemia, unspecified: Secondary | ICD-10-CM | POA: Diagnosis not present

## 2019-04-19 DIAGNOSIS — M4316 Spondylolisthesis, lumbar region: Secondary | ICD-10-CM | POA: Diagnosis not present

## 2019-04-19 DIAGNOSIS — D181 Lymphangioma, any site: Secondary | ICD-10-CM | POA: Diagnosis not present

## 2019-04-19 DIAGNOSIS — M47812 Spondylosis without myelopathy or radiculopathy, cervical region: Secondary | ICD-10-CM | POA: Diagnosis not present

## 2019-04-19 DIAGNOSIS — G2581 Restless legs syndrome: Secondary | ICD-10-CM | POA: Diagnosis not present

## 2019-04-19 NOTE — Telephone Encounter (Signed)
Called patient back again today no answer left message to call office back

## 2019-04-19 NOTE — Telephone Encounter (Signed)
Called patient- no answer & left message to call office back

## 2019-04-20 ENCOUNTER — Telehealth: Payer: Self-pay | Admitting: Neurology
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
## 2019-04-20 NOTE — Telephone Encounter (Signed)
Patient states that she wants to talk to someone about something that is in her records that she wants to have taken off of her records. I tried to explain that I did nto think we could do that but she wanted to talk to the nurse

## 2019-04-22 ENCOUNTER — Ambulatory Visit: Payer: Self-pay | Admitting: *Deleted

## 2019-04-22 DIAGNOSIS — M8580 Other specified disorders of bone density and structure, unspecified site: Secondary | ICD-10-CM | POA: Diagnosis not present

## 2019-04-22 DIAGNOSIS — M47812 Spondylosis without myelopathy or radiculopathy, cervical region: Secondary | ICD-10-CM | POA: Diagnosis not present

## 2019-04-22 DIAGNOSIS — G2581 Restless legs syndrome: Secondary | ICD-10-CM | POA: Diagnosis not present

## 2019-04-22 DIAGNOSIS — M810 Age-related osteoporosis without current pathological fracture: Secondary | ICD-10-CM | POA: Diagnosis not present

## 2019-04-22 DIAGNOSIS — Z8673 Personal history of transient ischemic attack (TIA), and cerebral infarction without residual deficits: Secondary | ICD-10-CM | POA: Diagnosis not present

## 2019-04-22 DIAGNOSIS — G20 Parkinson's disease: Secondary | ICD-10-CM | POA: Diagnosis not present

## 2019-04-22 DIAGNOSIS — K581 Irritable bowel syndrome with constipation: Secondary | ICD-10-CM | POA: Diagnosis not present

## 2019-04-22 DIAGNOSIS — M4316 Spondylolisthesis, lumbar region: Secondary | ICD-10-CM | POA: Diagnosis not present

## 2019-04-22 DIAGNOSIS — D510 Vitamin B12 deficiency anemia due to intrinsic factor deficiency: Secondary | ICD-10-CM | POA: Diagnosis not present

## 2019-04-22 DIAGNOSIS — M1612 Unilateral primary osteoarthritis, left hip: Secondary | ICD-10-CM | POA: Diagnosis not present

## 2019-04-22 DIAGNOSIS — Z9181 History of falling: Secondary | ICD-10-CM | POA: Diagnosis not present

## 2019-04-22 DIAGNOSIS — M216X9 Other acquired deformities of unspecified foot: Secondary | ICD-10-CM | POA: Diagnosis not present

## 2019-04-22 DIAGNOSIS — E034 Atrophy of thyroid (acquired): Secondary | ICD-10-CM | POA: Diagnosis not present

## 2019-04-22 DIAGNOSIS — E440 Moderate protein-calorie malnutrition: Secondary | ICD-10-CM | POA: Diagnosis not present

## 2019-04-22 DIAGNOSIS — E785 Hyperlipidemia, unspecified: Secondary | ICD-10-CM | POA: Diagnosis not present

## 2019-04-22 DIAGNOSIS — D181 Lymphangioma, any site: Secondary | ICD-10-CM | POA: Diagnosis not present

## 2019-04-22 NOTE — Telephone Encounter (Signed)
Appointment on 04/25/19

## 2019-04-22 NOTE — Telephone Encounter (Signed)
Pt and her friend [name] called with complaints of her right leg and foot are hot and swollen; they say both feet and dark and discolored; the swelling started a long time ago; they say that it is the same as it was on visit 03/21/2019; they would like to know why the pt is having this problem; pt disconnected before call could be transferred to Sam for appointment; she will call the pt back to schedule; will route to office for notification.  Reason for Disposition . [1] Caller requesting NON-URGENT health information AND [2] PCP's office is the best resource  Answer Assessment - Initial Assessment Questions 1. REASON FOR CALL or QUESTION: "What is your reason for calling today?" or "How can I best help you?" or "What question do you have that I can help answer?"     Pt's right leg still swollen, and discolorations to feet  Protocols used: INFORMATION ONLY CALL-A-AH
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
## 2019-04-25 ENCOUNTER — Ambulatory Visit (INDEPENDENT_AMBULATORY_CARE_PROVIDER_SITE_OTHER): Payer: Medicare Other | Admitting: Internal Medicine

## 2019-04-25 ENCOUNTER — Telehealth: Payer: Self-pay | Admitting: Internal Medicine

## 2019-04-25 ENCOUNTER — Encounter: Payer: Self-pay | Admitting: Internal Medicine

## 2019-04-25 DIAGNOSIS — M4316 Spondylolisthesis, lumbar region: Secondary | ICD-10-CM | POA: Diagnosis not present

## 2019-04-25 DIAGNOSIS — R600 Localized edema: Secondary | ICD-10-CM | POA: Diagnosis not present

## 2019-04-25 DIAGNOSIS — D181 Lymphangioma, any site: Secondary | ICD-10-CM | POA: Diagnosis not present

## 2019-04-25 DIAGNOSIS — M810 Age-related osteoporosis without current pathological fracture: Secondary | ICD-10-CM | POA: Diagnosis not present

## 2019-04-25 DIAGNOSIS — K581 Irritable bowel syndrome with constipation: Secondary | ICD-10-CM | POA: Diagnosis not present

## 2019-04-25 DIAGNOSIS — M8580 Other specified disorders of bone density and structure, unspecified site: Secondary | ICD-10-CM | POA: Diagnosis not present

## 2019-04-25 DIAGNOSIS — M216X9 Other acquired deformities of unspecified foot: Secondary | ICD-10-CM | POA: Diagnosis not present

## 2019-04-25 DIAGNOSIS — R300 Dysuria: Secondary | ICD-10-CM

## 2019-04-25 DIAGNOSIS — M47812 Spondylosis without myelopathy or radiculopathy, cervical region: Secondary | ICD-10-CM | POA: Diagnosis not present

## 2019-04-25 DIAGNOSIS — Z8673 Personal history of transient ischemic attack (TIA), and cerebral infarction without residual deficits: Secondary | ICD-10-CM | POA: Diagnosis not present

## 2019-04-25 DIAGNOSIS — Z9181 History of falling: Secondary | ICD-10-CM | POA: Diagnosis not present

## 2019-04-25 DIAGNOSIS — M1612 Unilateral primary osteoarthritis, left hip: Secondary | ICD-10-CM | POA: Diagnosis not present

## 2019-04-25 DIAGNOSIS — E034 Atrophy of thyroid (acquired): Secondary | ICD-10-CM | POA: Diagnosis not present

## 2019-04-25 DIAGNOSIS — G2581 Restless legs syndrome: Secondary | ICD-10-CM | POA: Diagnosis not present

## 2019-04-25 DIAGNOSIS — D510 Vitamin B12 deficiency anemia due to intrinsic factor deficiency: Secondary | ICD-10-CM | POA: Diagnosis not present

## 2019-04-25 DIAGNOSIS — G20 Parkinson's disease: Secondary | ICD-10-CM | POA: Diagnosis not present

## 2019-04-25 DIAGNOSIS — E785 Hyperlipidemia, unspecified: Secondary | ICD-10-CM | POA: Diagnosis not present

## 2019-04-25 DIAGNOSIS — E440 Moderate protein-calorie malnutrition: Secondary | ICD-10-CM | POA: Diagnosis not present

## 2019-04-25 MED ORDER — CEPHALEXIN 250 MG PO CAPS: 250.0000 mg | ORAL_CAPSULE | Freq: Two times a day (BID) | ORAL | 5 refills | Status: DC

## 2019-04-25 NOTE — Assessment & Plan Note (Signed)
She has chronic intermittent lower extremity edema-likely from chronic venous insufficiency She has chronic hyperpigmentation of her feet and at times some mild erythema in her lower extremities when swollen No calf pain or lower extremity pain except for her chronic feet pain for which she follows with podiatry Discussed that these are more chronic issues and nothing acute so no treatment is necessary She has been on furosemide in the past and that if the swelling is bothering her we can try that again as needed, but it will make her go to the bathroom more and possibly increase her risk of falls-she does not want any other medications and at this point we will just monitor Continue to elevate I do not think she would tolerate compression socks, but that is also an option

## 2019-04-25 NOTE — Telephone Encounter (Signed)
LVM giving ok for orders per Dr. Burns.  

## 2019-04-25 NOTE — Telephone Encounter (Signed)
Copied from [place] [id number]. Topic: Quick Communication - Home Health Verbal Orders >> Apr 25, 2019 11:16 AM Carolyn Stare wrote: Caller/Agency Flor with [facility]  Callback Number [phone]  Requesting req extending home PT    Frequency 2 x 4

## 2019-04-25 NOTE — Assessment & Plan Note (Signed)
Continues to have dysuria Did have a UTI-gave her sample to urology and they did give her a round of Cipro ?  Completely treated or not She would like something to help with her dysuria and help prevent infections I advised her to follow-up with urology since they have her most recent urine tests, but she does not want to do that and she and her friend are adamant that I give her something for prevention Discussed that I do not know if this will help because I do not have any recent cultures, but they still want to try it Keflex 250 mg twice daily Advised follow-up with urology

## 2019-04-27 DIAGNOSIS — M47812 Spondylosis without myelopathy or radiculopathy, cervical region: Secondary | ICD-10-CM | POA: Diagnosis not present

## 2019-04-27 DIAGNOSIS — E440 Moderate protein-calorie malnutrition: Secondary | ICD-10-CM | POA: Diagnosis not present

## 2019-04-27 DIAGNOSIS — M8580 Other specified disorders of bone density and structure, unspecified site: Secondary | ICD-10-CM | POA: Diagnosis not present

## 2019-04-27 DIAGNOSIS — M810 Age-related osteoporosis without current pathological fracture: Secondary | ICD-10-CM | POA: Diagnosis not present

## 2019-04-27 DIAGNOSIS — Z8673 Personal history of transient ischemic attack (TIA), and cerebral infarction without residual deficits: Secondary | ICD-10-CM | POA: Diagnosis not present

## 2019-04-27 DIAGNOSIS — E785 Hyperlipidemia, unspecified: Secondary | ICD-10-CM | POA: Diagnosis not present

## 2019-04-27 DIAGNOSIS — G20 Parkinson's disease: Secondary | ICD-10-CM | POA: Diagnosis not present

## 2019-04-27 DIAGNOSIS — M4316 Spondylolisthesis, lumbar region: Secondary | ICD-10-CM | POA: Diagnosis not present

## 2019-04-27 DIAGNOSIS — K581 Irritable bowel syndrome with constipation: Secondary | ICD-10-CM | POA: Diagnosis not present

## 2019-04-27 DIAGNOSIS — M1612 Unilateral primary osteoarthritis, left hip: Secondary | ICD-10-CM | POA: Diagnosis not present

## 2019-04-27 DIAGNOSIS — D510 Vitamin B12 deficiency anemia due to intrinsic factor deficiency: Secondary | ICD-10-CM | POA: Diagnosis not present

## 2019-04-27 DIAGNOSIS — D181 Lymphangioma, any site: Secondary | ICD-10-CM | POA: Diagnosis not present

## 2019-04-27 DIAGNOSIS — Z9181 History of falling: Secondary | ICD-10-CM | POA: Diagnosis not present

## 2019-04-27 DIAGNOSIS — G2581 Restless legs syndrome: Secondary | ICD-10-CM | POA: Diagnosis not present

## 2019-04-27 DIAGNOSIS — M216X9 Other acquired deformities of unspecified foot: Secondary | ICD-10-CM | POA: Diagnosis not present

## 2019-04-27 DIAGNOSIS — E034 Atrophy of thyroid (acquired): Secondary | ICD-10-CM | POA: Diagnosis not present

## 2019-04-29 ENCOUNTER — Telehealth: Payer: Self-pay | Admitting: Internal Medicine

## 2019-04-29 NOTE — Telephone Encounter (Signed)
Please call pt back.

## 2019-04-29 NOTE — Telephone Encounter (Signed)
Ok with you?

## 2019-04-29 NOTE — Telephone Encounter (Signed)
Pt aware. Changed to doxy.

## 2019-04-29 NOTE — Telephone Encounter (Signed)
Copied from [place] [id number] [id number]. Topic: Appointment Scheduling - Scheduling Inquiry for Clinic >> Apr 29, 2019 11:38 AM Elizabeth [name] wrote: Reason for CRM: patient would like to know if she could make her appt for 6/10 a phone visit. Patient is not sure if she will be able to get transportation that day.  Patient call back # [phone]

## 2019-04-29 NOTE — Telephone Encounter (Signed)
Yes, ok 

## 2019-05-02 ENCOUNTER — Telehealth: Payer: Self-pay | Admitting: Internal Medicine

## 2019-05-02 NOTE — Telephone Encounter (Signed)
Patient wanted to cancel appt. Appt canceled, she will call back to resch

## 2019-05-02 NOTE — Telephone Encounter (Signed)
Patient called nurse line this weekend wanting to speak with Dr. Quay Burow assistant in regard to appointment this week.  I did try to reach out to patient but got a VM.  Please follow back up with patient.

## 2019-05-02 NOTE — Telephone Encounter (Signed)
Noted  

## 2019-05-03 DIAGNOSIS — M216X9 Other acquired deformities of unspecified foot: Secondary | ICD-10-CM | POA: Diagnosis not present

## 2019-05-03 DIAGNOSIS — D510 Vitamin B12 deficiency anemia due to intrinsic factor deficiency: Secondary | ICD-10-CM | POA: Diagnosis not present

## 2019-05-03 DIAGNOSIS — M1612 Unilateral primary osteoarthritis, left hip: Secondary | ICD-10-CM | POA: Diagnosis not present

## 2019-05-03 DIAGNOSIS — E440 Moderate protein-calorie malnutrition: Secondary | ICD-10-CM | POA: Diagnosis not present

## 2019-05-03 DIAGNOSIS — D181 Lymphangioma, any site: Secondary | ICD-10-CM | POA: Diagnosis not present

## 2019-05-03 DIAGNOSIS — M4316 Spondylolisthesis, lumbar region: Secondary | ICD-10-CM | POA: Diagnosis not present

## 2019-05-03 DIAGNOSIS — G20 Parkinson's disease: Secondary | ICD-10-CM | POA: Diagnosis not present

## 2019-05-03 DIAGNOSIS — E034 Atrophy of thyroid (acquired): Secondary | ICD-10-CM | POA: Diagnosis not present

## 2019-05-03 DIAGNOSIS — M810 Age-related osteoporosis without current pathological fracture: Secondary | ICD-10-CM | POA: Diagnosis not present

## 2019-05-03 DIAGNOSIS — Z9181 History of falling: Secondary | ICD-10-CM | POA: Diagnosis not present

## 2019-05-03 DIAGNOSIS — K581 Irritable bowel syndrome with constipation: Secondary | ICD-10-CM | POA: Diagnosis not present

## 2019-05-03 DIAGNOSIS — Z8673 Personal history of transient ischemic attack (TIA), and cerebral infarction without residual deficits: Secondary | ICD-10-CM | POA: Diagnosis not present

## 2019-05-03 DIAGNOSIS — E785 Hyperlipidemia, unspecified: Secondary | ICD-10-CM | POA: Diagnosis not present

## 2019-05-03 DIAGNOSIS — M8580 Other specified disorders of bone density and structure, unspecified site: Secondary | ICD-10-CM | POA: Diagnosis not present

## 2019-05-03 DIAGNOSIS — G2581 Restless legs syndrome: Secondary | ICD-10-CM | POA: Diagnosis not present

## 2019-05-03 DIAGNOSIS — M47812 Spondylosis without myelopathy or radiculopathy, cervical region: Secondary | ICD-10-CM | POA: Diagnosis not present

## 2019-05-04 ENCOUNTER — Ambulatory Visit: Payer: Medicare Other | Admitting: Internal Medicine

## 2019-05-06 DIAGNOSIS — M47812 Spondylosis without myelopathy or radiculopathy, cervical region: Secondary | ICD-10-CM | POA: Diagnosis not present

## 2019-05-06 DIAGNOSIS — E440 Moderate protein-calorie malnutrition: Secondary | ICD-10-CM | POA: Diagnosis not present

## 2019-05-06 DIAGNOSIS — Z8673 Personal history of transient ischemic attack (TIA), and cerebral infarction without residual deficits: Secondary | ICD-10-CM | POA: Diagnosis not present

## 2019-05-06 DIAGNOSIS — G2581 Restless legs syndrome: Secondary | ICD-10-CM | POA: Diagnosis not present

## 2019-05-06 DIAGNOSIS — G20 Parkinson's disease: Secondary | ICD-10-CM | POA: Diagnosis not present

## 2019-05-06 DIAGNOSIS — D181 Lymphangioma, any site: Secondary | ICD-10-CM | POA: Diagnosis not present

## 2019-05-06 DIAGNOSIS — E785 Hyperlipidemia, unspecified: Secondary | ICD-10-CM | POA: Diagnosis not present

## 2019-05-06 DIAGNOSIS — M8580 Other specified disorders of bone density and structure, unspecified site: Secondary | ICD-10-CM | POA: Diagnosis not present

## 2019-05-06 DIAGNOSIS — E034 Atrophy of thyroid (acquired): Secondary | ICD-10-CM | POA: Diagnosis not present

## 2019-05-06 DIAGNOSIS — K581 Irritable bowel syndrome with constipation: Secondary | ICD-10-CM | POA: Diagnosis not present

## 2019-05-06 DIAGNOSIS — M1612 Unilateral primary osteoarthritis, left hip: Secondary | ICD-10-CM | POA: Diagnosis not present

## 2019-05-06 DIAGNOSIS — M216X9 Other acquired deformities of unspecified foot: Secondary | ICD-10-CM | POA: Diagnosis not present

## 2019-05-06 DIAGNOSIS — Z9181 History of falling: Secondary | ICD-10-CM | POA: Diagnosis not present

## 2019-05-06 DIAGNOSIS — M4316 Spondylolisthesis, lumbar region: Secondary | ICD-10-CM | POA: Diagnosis not present

## 2019-05-06 DIAGNOSIS — D510 Vitamin B12 deficiency anemia due to intrinsic factor deficiency: Secondary | ICD-10-CM | POA: Diagnosis not present

## 2019-05-06 DIAGNOSIS — M810 Age-related osteoporosis without current pathological fracture: Secondary | ICD-10-CM | POA: Diagnosis not present

## 2019-05-10 DIAGNOSIS — G20 Parkinson's disease: Secondary | ICD-10-CM | POA: Diagnosis not present

## 2019-05-10 DIAGNOSIS — M8580 Other specified disorders of bone density and structure, unspecified site: Secondary | ICD-10-CM | POA: Diagnosis not present

## 2019-05-10 DIAGNOSIS — M4316 Spondylolisthesis, lumbar region: Secondary | ICD-10-CM | POA: Diagnosis not present

## 2019-05-10 DIAGNOSIS — Z8673 Personal history of transient ischemic attack (TIA), and cerebral infarction without residual deficits: Secondary | ICD-10-CM | POA: Diagnosis not present

## 2019-05-10 DIAGNOSIS — M47812 Spondylosis without myelopathy or radiculopathy, cervical region: Secondary | ICD-10-CM | POA: Diagnosis not present

## 2019-05-10 DIAGNOSIS — D510 Vitamin B12 deficiency anemia due to intrinsic factor deficiency: Secondary | ICD-10-CM | POA: Diagnosis not present

## 2019-05-10 DIAGNOSIS — E785 Hyperlipidemia, unspecified: Secondary | ICD-10-CM | POA: Diagnosis not present

## 2019-05-10 DIAGNOSIS — M1612 Unilateral primary osteoarthritis, left hip: Secondary | ICD-10-CM | POA: Diagnosis not present

## 2019-05-10 DIAGNOSIS — E034 Atrophy of thyroid (acquired): Secondary | ICD-10-CM | POA: Diagnosis not present

## 2019-05-10 DIAGNOSIS — G2581 Restless legs syndrome: Secondary | ICD-10-CM | POA: Diagnosis not present

## 2019-05-10 DIAGNOSIS — E440 Moderate protein-calorie malnutrition: Secondary | ICD-10-CM | POA: Diagnosis not present

## 2019-05-10 DIAGNOSIS — Z9181 History of falling: Secondary | ICD-10-CM | POA: Diagnosis not present

## 2019-05-10 DIAGNOSIS — D181 Lymphangioma, any site: Secondary | ICD-10-CM | POA: Diagnosis not present

## 2019-05-10 DIAGNOSIS — M810 Age-related osteoporosis without current pathological fracture: Secondary | ICD-10-CM | POA: Diagnosis not present

## 2019-05-10 DIAGNOSIS — M216X9 Other acquired deformities of unspecified foot: Secondary | ICD-10-CM | POA: Diagnosis not present

## 2019-05-10 DIAGNOSIS — K581 Irritable bowel syndrome with constipation: Secondary | ICD-10-CM | POA: Diagnosis not present

## 2019-05-12 DIAGNOSIS — E785 Hyperlipidemia, unspecified: Secondary | ICD-10-CM | POA: Diagnosis not present

## 2019-05-12 DIAGNOSIS — Z9181 History of falling: Secondary | ICD-10-CM | POA: Diagnosis not present

## 2019-05-12 DIAGNOSIS — D181 Lymphangioma, any site: Secondary | ICD-10-CM | POA: Diagnosis not present

## 2019-05-12 DIAGNOSIS — M810 Age-related osteoporosis without current pathological fracture: Secondary | ICD-10-CM | POA: Diagnosis not present

## 2019-05-12 DIAGNOSIS — M8580 Other specified disorders of bone density and structure, unspecified site: Secondary | ICD-10-CM | POA: Diagnosis not present

## 2019-05-12 DIAGNOSIS — G20 Parkinson's disease: Secondary | ICD-10-CM | POA: Diagnosis not present

## 2019-05-12 DIAGNOSIS — M216X9 Other acquired deformities of unspecified foot: Secondary | ICD-10-CM | POA: Diagnosis not present

## 2019-05-12 DIAGNOSIS — K581 Irritable bowel syndrome with constipation: Secondary | ICD-10-CM | POA: Diagnosis not present

## 2019-05-12 DIAGNOSIS — D510 Vitamin B12 deficiency anemia due to intrinsic factor deficiency: Secondary | ICD-10-CM | POA: Diagnosis not present

## 2019-05-12 DIAGNOSIS — G2581 Restless legs syndrome: Secondary | ICD-10-CM | POA: Diagnosis not present

## 2019-05-12 DIAGNOSIS — M47812 Spondylosis without myelopathy or radiculopathy, cervical region: Secondary | ICD-10-CM | POA: Diagnosis not present

## 2019-05-12 DIAGNOSIS — M4316 Spondylolisthesis, lumbar region: Secondary | ICD-10-CM | POA: Diagnosis not present

## 2019-05-12 DIAGNOSIS — E034 Atrophy of thyroid (acquired): Secondary | ICD-10-CM | POA: Diagnosis not present

## 2019-05-12 DIAGNOSIS — Z8673 Personal history of transient ischemic attack (TIA), and cerebral infarction without residual deficits: Secondary | ICD-10-CM | POA: Diagnosis not present

## 2019-05-12 DIAGNOSIS — M1612 Unilateral primary osteoarthritis, left hip: Secondary | ICD-10-CM | POA: Diagnosis not present

## 2019-05-12 DIAGNOSIS — E440 Moderate protein-calorie malnutrition: Secondary | ICD-10-CM | POA: Diagnosis not present

## 2019-05-16 DIAGNOSIS — G2581 Restless legs syndrome: Secondary | ICD-10-CM | POA: Diagnosis not present

## 2019-05-16 DIAGNOSIS — Z9181 History of falling: Secondary | ICD-10-CM | POA: Diagnosis not present

## 2019-05-16 DIAGNOSIS — G20 Parkinson's disease: Secondary | ICD-10-CM | POA: Diagnosis not present

## 2019-05-16 DIAGNOSIS — K581 Irritable bowel syndrome with constipation: Secondary | ICD-10-CM | POA: Diagnosis not present

## 2019-05-16 DIAGNOSIS — D510 Vitamin B12 deficiency anemia due to intrinsic factor deficiency: Secondary | ICD-10-CM | POA: Diagnosis not present

## 2019-05-16 DIAGNOSIS — M810 Age-related osteoporosis without current pathological fracture: Secondary | ICD-10-CM | POA: Diagnosis not present

## 2019-05-16 DIAGNOSIS — M1612 Unilateral primary osteoarthritis, left hip: Secondary | ICD-10-CM | POA: Diagnosis not present

## 2019-05-16 DIAGNOSIS — M8580 Other specified disorders of bone density and structure, unspecified site: Secondary | ICD-10-CM | POA: Diagnosis not present

## 2019-05-16 DIAGNOSIS — E785 Hyperlipidemia, unspecified: Secondary | ICD-10-CM | POA: Diagnosis not present

## 2019-05-16 DIAGNOSIS — Z8673 Personal history of transient ischemic attack (TIA), and cerebral infarction without residual deficits: Secondary | ICD-10-CM | POA: Diagnosis not present

## 2019-05-16 DIAGNOSIS — E440 Moderate protein-calorie malnutrition: Secondary | ICD-10-CM | POA: Diagnosis not present

## 2019-05-16 DIAGNOSIS — M216X9 Other acquired deformities of unspecified foot: Secondary | ICD-10-CM | POA: Diagnosis not present

## 2019-05-16 DIAGNOSIS — M47812 Spondylosis without myelopathy or radiculopathy, cervical region: Secondary | ICD-10-CM | POA: Diagnosis not present

## 2019-05-16 DIAGNOSIS — D181 Lymphangioma, any site: Secondary | ICD-10-CM | POA: Diagnosis not present

## 2019-05-16 DIAGNOSIS — E034 Atrophy of thyroid (acquired): Secondary | ICD-10-CM | POA: Diagnosis not present

## 2019-05-16 DIAGNOSIS — M4316 Spondylolisthesis, lumbar region: Secondary | ICD-10-CM | POA: Diagnosis not present

## 2019-05-18 DIAGNOSIS — Z9181 History of falling: Secondary | ICD-10-CM | POA: Diagnosis not present

## 2019-05-18 DIAGNOSIS — M810 Age-related osteoporosis without current pathological fracture: Secondary | ICD-10-CM | POA: Diagnosis not present

## 2019-05-18 DIAGNOSIS — E034 Atrophy of thyroid (acquired): Secondary | ICD-10-CM | POA: Diagnosis not present

## 2019-05-18 DIAGNOSIS — E785 Hyperlipidemia, unspecified: Secondary | ICD-10-CM | POA: Diagnosis not present

## 2019-05-18 DIAGNOSIS — M1612 Unilateral primary osteoarthritis, left hip: Secondary | ICD-10-CM | POA: Diagnosis not present

## 2019-05-18 DIAGNOSIS — M47812 Spondylosis without myelopathy or radiculopathy, cervical region: Secondary | ICD-10-CM | POA: Diagnosis not present

## 2019-05-18 DIAGNOSIS — G2581 Restless legs syndrome: Secondary | ICD-10-CM | POA: Diagnosis not present

## 2019-05-18 DIAGNOSIS — K581 Irritable bowel syndrome with constipation: Secondary | ICD-10-CM | POA: Diagnosis not present

## 2019-05-18 DIAGNOSIS — G20 Parkinson's disease: Secondary | ICD-10-CM | POA: Diagnosis not present

## 2019-05-18 DIAGNOSIS — D510 Vitamin B12 deficiency anemia due to intrinsic factor deficiency: Secondary | ICD-10-CM | POA: Diagnosis not present

## 2019-05-18 DIAGNOSIS — M4316 Spondylolisthesis, lumbar region: Secondary | ICD-10-CM | POA: Diagnosis not present

## 2019-05-18 DIAGNOSIS — Z8673 Personal history of transient ischemic attack (TIA), and cerebral infarction without residual deficits: Secondary | ICD-10-CM | POA: Diagnosis not present

## 2019-05-18 DIAGNOSIS — E440 Moderate protein-calorie malnutrition: Secondary | ICD-10-CM | POA: Diagnosis not present

## 2019-05-18 DIAGNOSIS — M8580 Other specified disorders of bone density and structure, unspecified site: Secondary | ICD-10-CM | POA: Diagnosis not present

## 2019-05-18 DIAGNOSIS — D181 Lymphangioma, any site: Secondary | ICD-10-CM | POA: Diagnosis not present

## 2019-05-18 DIAGNOSIS — M216X9 Other acquired deformities of unspecified foot: Secondary | ICD-10-CM | POA: Diagnosis not present

## 2019-05-19 ENCOUNTER — Other Ambulatory Visit: Payer: Self-pay | Admitting: Internal Medicine

## 2019-05-20 ENCOUNTER — Telehealth: Payer: Self-pay

## 2019-05-20 ENCOUNTER — Telehealth: Payer: Self-pay | Admitting: Neurology

## 2019-05-20 MED ORDER — RYTARY 36.25-145 MG PO CPCR: 1.0000 | ORAL_CAPSULE | Freq: Four times a day (QID) | ORAL | 0 refills | Status: DC

## 2019-05-20 NOTE — Telephone Encounter (Signed)
Request . Refill RYTARY per Rushie Nyhan, friend, request. [employer].  [name] also brought pt HIPPA form giving consent for her to access verbal PHI as needed.

## 2019-05-20 NOTE — Telephone Encounter (Signed)
rx was called into the pharmacy this morning See other noted

## 2019-05-20 NOTE — Telephone Encounter (Signed)
Okay to give orders as requested;  

## 2019-05-20 NOTE — Telephone Encounter (Signed)
Okay to refill for correct dosage that I increased to the last visit

## 2019-05-20 NOTE — Telephone Encounter (Signed)
Copied from [place] [id number]. Topic: General - Other >> May 20, 2019 12:55 PM Rainey Pines A wrote: Orland Mustard physcial therapist from [facility] is requesting verbal orders for pt 1w8 extension . Callback number [phone]  Routing to laura,NP, please advise in the absence of dr burns, I will call home health back, thanks

## 2019-05-20 NOTE — Telephone Encounter (Signed)
Requested Prescriptions   Pending Prescriptions Disp Refills  . Carbidopa-Levodopa ER (RYTARY) 36.25-145 MG CPCR 120 capsule 0    Sig: Take 1 tablet by mouth 4 (four) times daily.   Rx last filled: 01/10/19 #120 0 REFILLS by Inocencio Homes, MD  Pt last seen:03/30/19  Follow up appt scheduled:09/06/19   Pharmacy requesting 1 tablet 4 times a day  Last office note 03/30/19   Assessment and Plan:   Parkinson's disease with Parkinson's disease dementia             -Patient is to increase Rytary 145 mg, 2 tablets in the morning, 2 in the afternoon and 1 in evening.  We have discussed this the last several visits.  The only reason for the increase this to see if we can control the restless leg.  If hallucinations increase, we will obviously decrease the Rytary.             Need dose clarification

## 2019-05-20 NOTE — Telephone Encounter (Addendum)
Rx called in for RYTARY 145 MG 2 tablet in the morning, 2 in the afternoon, and 1 in evening.  #420 1 refill  Left on pharmacy line

## 2019-05-20 NOTE — Telephone Encounter (Signed)
lvm advising ok order for PT week extension

## 2019-05-25 DIAGNOSIS — M4316 Spondylolisthesis, lumbar region: Secondary | ICD-10-CM | POA: Diagnosis not present

## 2019-05-25 DIAGNOSIS — G20 Parkinson's disease: Secondary | ICD-10-CM | POA: Diagnosis not present

## 2019-05-25 DIAGNOSIS — K581 Irritable bowel syndrome with constipation: Secondary | ICD-10-CM | POA: Diagnosis not present

## 2019-05-25 DIAGNOSIS — E440 Moderate protein-calorie malnutrition: Secondary | ICD-10-CM | POA: Diagnosis not present

## 2019-05-25 DIAGNOSIS — Z792 Long term (current) use of antibiotics: Secondary | ICD-10-CM | POA: Diagnosis not present

## 2019-05-25 DIAGNOSIS — M810 Age-related osteoporosis without current pathological fracture: Secondary | ICD-10-CM | POA: Diagnosis not present

## 2019-05-25 DIAGNOSIS — M47812 Spondylosis without myelopathy or radiculopathy, cervical region: Secondary | ICD-10-CM | POA: Diagnosis not present

## 2019-05-25 DIAGNOSIS — M216X9 Other acquired deformities of unspecified foot: Secondary | ICD-10-CM | POA: Diagnosis not present

## 2019-05-25 DIAGNOSIS — Z9181 History of falling: Secondary | ICD-10-CM | POA: Diagnosis not present

## 2019-05-25 DIAGNOSIS — M8580 Other specified disorders of bone density and structure, unspecified site: Secondary | ICD-10-CM | POA: Diagnosis not present

## 2019-05-25 DIAGNOSIS — G2581 Restless legs syndrome: Secondary | ICD-10-CM | POA: Diagnosis not present

## 2019-05-25 DIAGNOSIS — E785 Hyperlipidemia, unspecified: Secondary | ICD-10-CM | POA: Diagnosis not present

## 2019-05-25 DIAGNOSIS — D510 Vitamin B12 deficiency anemia due to intrinsic factor deficiency: Secondary | ICD-10-CM | POA: Diagnosis not present

## 2019-05-25 DIAGNOSIS — E034 Atrophy of thyroid (acquired): Secondary | ICD-10-CM | POA: Diagnosis not present

## 2019-05-25 DIAGNOSIS — Z8673 Personal history of transient ischemic attack (TIA), and cerebral infarction without residual deficits: Secondary | ICD-10-CM | POA: Diagnosis not present

## 2019-05-25 DIAGNOSIS — D181 Lymphangioma, any site: Secondary | ICD-10-CM | POA: Diagnosis not present

## 2019-05-25 DIAGNOSIS — M1612 Unilateral primary osteoarthritis, left hip: Secondary | ICD-10-CM | POA: Diagnosis not present

## 2019-06-01 DIAGNOSIS — E440 Moderate protein-calorie malnutrition: Secondary | ICD-10-CM | POA: Diagnosis not present

## 2019-06-01 DIAGNOSIS — E034 Atrophy of thyroid (acquired): Secondary | ICD-10-CM | POA: Diagnosis not present

## 2019-06-01 DIAGNOSIS — Z792 Long term (current) use of antibiotics: Secondary | ICD-10-CM | POA: Diagnosis not present

## 2019-06-01 DIAGNOSIS — G20 Parkinson's disease: Secondary | ICD-10-CM | POA: Diagnosis not present

## 2019-06-01 DIAGNOSIS — G2581 Restless legs syndrome: Secondary | ICD-10-CM | POA: Diagnosis not present

## 2019-06-01 DIAGNOSIS — D181 Lymphangioma, any site: Secondary | ICD-10-CM | POA: Diagnosis not present

## 2019-06-01 DIAGNOSIS — Z8673 Personal history of transient ischemic attack (TIA), and cerebral infarction without residual deficits: Secondary | ICD-10-CM | POA: Diagnosis not present

## 2019-06-01 DIAGNOSIS — M8580 Other specified disorders of bone density and structure, unspecified site: Secondary | ICD-10-CM | POA: Diagnosis not present

## 2019-06-01 DIAGNOSIS — E785 Hyperlipidemia, unspecified: Secondary | ICD-10-CM | POA: Diagnosis not present

## 2019-06-01 DIAGNOSIS — M1612 Unilateral primary osteoarthritis, left hip: Secondary | ICD-10-CM | POA: Diagnosis not present

## 2019-06-01 DIAGNOSIS — K581 Irritable bowel syndrome with constipation: Secondary | ICD-10-CM | POA: Diagnosis not present

## 2019-06-01 DIAGNOSIS — M4316 Spondylolisthesis, lumbar region: Secondary | ICD-10-CM | POA: Diagnosis not present

## 2019-06-01 DIAGNOSIS — Z9181 History of falling: Secondary | ICD-10-CM | POA: Diagnosis not present

## 2019-06-01 DIAGNOSIS — M47812 Spondylosis without myelopathy or radiculopathy, cervical region: Secondary | ICD-10-CM | POA: Diagnosis not present

## 2019-06-01 DIAGNOSIS — M810 Age-related osteoporosis without current pathological fracture: Secondary | ICD-10-CM | POA: Diagnosis not present

## 2019-06-01 DIAGNOSIS — M216X9 Other acquired deformities of unspecified foot: Secondary | ICD-10-CM | POA: Diagnosis not present

## 2019-06-01 DIAGNOSIS — D510 Vitamin B12 deficiency anemia due to intrinsic factor deficiency: Secondary | ICD-10-CM | POA: Diagnosis not present

## 2019-06-10 DIAGNOSIS — K581 Irritable bowel syndrome with constipation: Secondary | ICD-10-CM | POA: Diagnosis not present

## 2019-06-10 DIAGNOSIS — E785 Hyperlipidemia, unspecified: Secondary | ICD-10-CM | POA: Diagnosis not present

## 2019-06-10 DIAGNOSIS — D510 Vitamin B12 deficiency anemia due to intrinsic factor deficiency: Secondary | ICD-10-CM | POA: Diagnosis not present

## 2019-06-10 DIAGNOSIS — M810 Age-related osteoporosis without current pathological fracture: Secondary | ICD-10-CM | POA: Diagnosis not present

## 2019-06-10 DIAGNOSIS — M8580 Other specified disorders of bone density and structure, unspecified site: Secondary | ICD-10-CM | POA: Diagnosis not present

## 2019-06-10 DIAGNOSIS — E034 Atrophy of thyroid (acquired): Secondary | ICD-10-CM | POA: Diagnosis not present

## 2019-06-10 DIAGNOSIS — M4316 Spondylolisthesis, lumbar region: Secondary | ICD-10-CM | POA: Diagnosis not present

## 2019-06-10 DIAGNOSIS — Z9181 History of falling: Secondary | ICD-10-CM | POA: Diagnosis not present

## 2019-06-10 DIAGNOSIS — E440 Moderate protein-calorie malnutrition: Secondary | ICD-10-CM | POA: Diagnosis not present

## 2019-06-10 DIAGNOSIS — G20 Parkinson's disease: Secondary | ICD-10-CM | POA: Diagnosis not present

## 2019-06-10 DIAGNOSIS — M47812 Spondylosis without myelopathy or radiculopathy, cervical region: Secondary | ICD-10-CM | POA: Diagnosis not present

## 2019-06-10 DIAGNOSIS — G2581 Restless legs syndrome: Secondary | ICD-10-CM | POA: Diagnosis not present

## 2019-06-10 DIAGNOSIS — M216X9 Other acquired deformities of unspecified foot: Secondary | ICD-10-CM | POA: Diagnosis not present

## 2019-06-10 DIAGNOSIS — Z792 Long term (current) use of antibiotics: Secondary | ICD-10-CM | POA: Diagnosis not present

## 2019-06-10 DIAGNOSIS — D181 Lymphangioma, any site: Secondary | ICD-10-CM | POA: Diagnosis not present

## 2019-06-10 DIAGNOSIS — Z8673 Personal history of transient ischemic attack (TIA), and cerebral infarction without residual deficits: Secondary | ICD-10-CM | POA: Diagnosis not present

## 2019-06-10 DIAGNOSIS — M1612 Unilateral primary osteoarthritis, left hip: Secondary | ICD-10-CM | POA: Diagnosis not present

## 2019-06-15 DIAGNOSIS — Z792 Long term (current) use of antibiotics: Secondary | ICD-10-CM | POA: Diagnosis not present

## 2019-06-15 DIAGNOSIS — Z8673 Personal history of transient ischemic attack (TIA), and cerebral infarction without residual deficits: Secondary | ICD-10-CM | POA: Diagnosis not present

## 2019-06-15 DIAGNOSIS — M810 Age-related osteoporosis without current pathological fracture: Secondary | ICD-10-CM | POA: Diagnosis not present

## 2019-06-15 DIAGNOSIS — G20 Parkinson's disease: Secondary | ICD-10-CM | POA: Diagnosis not present

## 2019-06-15 DIAGNOSIS — K581 Irritable bowel syndrome with constipation: Secondary | ICD-10-CM | POA: Diagnosis not present

## 2019-06-15 DIAGNOSIS — M216X9 Other acquired deformities of unspecified foot: Secondary | ICD-10-CM | POA: Diagnosis not present

## 2019-06-15 DIAGNOSIS — G2581 Restless legs syndrome: Secondary | ICD-10-CM | POA: Diagnosis not present

## 2019-06-15 DIAGNOSIS — M47812 Spondylosis without myelopathy or radiculopathy, cervical region: Secondary | ICD-10-CM | POA: Diagnosis not present

## 2019-06-15 DIAGNOSIS — Z9181 History of falling: Secondary | ICD-10-CM | POA: Diagnosis not present

## 2019-06-15 DIAGNOSIS — E785 Hyperlipidemia, unspecified: Secondary | ICD-10-CM | POA: Diagnosis not present

## 2019-06-15 DIAGNOSIS — E440 Moderate protein-calorie malnutrition: Secondary | ICD-10-CM | POA: Diagnosis not present

## 2019-06-15 DIAGNOSIS — M4316 Spondylolisthesis, lumbar region: Secondary | ICD-10-CM | POA: Diagnosis not present

## 2019-06-15 DIAGNOSIS — D181 Lymphangioma, any site: Secondary | ICD-10-CM | POA: Diagnosis not present

## 2019-06-15 DIAGNOSIS — M1612 Unilateral primary osteoarthritis, left hip: Secondary | ICD-10-CM | POA: Diagnosis not present

## 2019-06-15 DIAGNOSIS — D510 Vitamin B12 deficiency anemia due to intrinsic factor deficiency: Secondary | ICD-10-CM | POA: Diagnosis not present

## 2019-06-15 DIAGNOSIS — M8580 Other specified disorders of bone density and structure, unspecified site: Secondary | ICD-10-CM | POA: Diagnosis not present

## 2019-06-15 DIAGNOSIS — E034 Atrophy of thyroid (acquired): Secondary | ICD-10-CM | POA: Diagnosis not present

## 2019-06-24 DIAGNOSIS — E785 Hyperlipidemia, unspecified: Secondary | ICD-10-CM | POA: Diagnosis not present

## 2019-06-24 DIAGNOSIS — M810 Age-related osteoporosis without current pathological fracture: Secondary | ICD-10-CM | POA: Diagnosis not present

## 2019-06-24 DIAGNOSIS — M47812 Spondylosis without myelopathy or radiculopathy, cervical region: Secondary | ICD-10-CM | POA: Diagnosis not present

## 2019-06-24 DIAGNOSIS — E440 Moderate protein-calorie malnutrition: Secondary | ICD-10-CM | POA: Diagnosis not present

## 2019-06-24 DIAGNOSIS — D510 Vitamin B12 deficiency anemia due to intrinsic factor deficiency: Secondary | ICD-10-CM | POA: Diagnosis not present

## 2019-06-24 DIAGNOSIS — G20 Parkinson's disease: Secondary | ICD-10-CM | POA: Diagnosis not present

## 2019-06-24 DIAGNOSIS — Z8673 Personal history of transient ischemic attack (TIA), and cerebral infarction without residual deficits: Secondary | ICD-10-CM | POA: Diagnosis not present

## 2019-06-24 DIAGNOSIS — Z9181 History of falling: Secondary | ICD-10-CM | POA: Diagnosis not present

## 2019-06-24 DIAGNOSIS — D181 Lymphangioma, any site: Secondary | ICD-10-CM | POA: Diagnosis not present

## 2019-06-24 DIAGNOSIS — M4316 Spondylolisthesis, lumbar region: Secondary | ICD-10-CM | POA: Diagnosis not present

## 2019-06-24 DIAGNOSIS — E034 Atrophy of thyroid (acquired): Secondary | ICD-10-CM | POA: Diagnosis not present

## 2019-06-24 DIAGNOSIS — K581 Irritable bowel syndrome with constipation: Secondary | ICD-10-CM | POA: Diagnosis not present

## 2019-06-24 DIAGNOSIS — G2581 Restless legs syndrome: Secondary | ICD-10-CM | POA: Diagnosis not present

## 2019-06-24 DIAGNOSIS — M8580 Other specified disorders of bone density and structure, unspecified site: Secondary | ICD-10-CM | POA: Diagnosis not present

## 2019-06-24 DIAGNOSIS — M1612 Unilateral primary osteoarthritis, left hip: Secondary | ICD-10-CM | POA: Diagnosis not present

## 2019-06-24 DIAGNOSIS — M216X9 Other acquired deformities of unspecified foot: Secondary | ICD-10-CM | POA: Diagnosis not present

## 2019-06-24 DIAGNOSIS — Z792 Long term (current) use of antibiotics: Secondary | ICD-10-CM | POA: Diagnosis not present

## 2019-06-24 NOTE — Progress Notes (Signed)
Test performed on 03/28/19 by Colon Branch

## 2019-06-28 ENCOUNTER — Telehealth: Payer: Self-pay | Admitting: Internal Medicine

## 2019-06-28 NOTE — Telephone Encounter (Signed)
Gave ok for orders per Dr. Burns.  

## 2019-06-28 NOTE — Telephone Encounter (Signed)
Caller name: Flor  Relation to pt:PT  Call back number: [phone]  Reason for call:  Requesting verbal orders for additional PT visit for 1x 3

## 2019-07-01 DIAGNOSIS — M4316 Spondylolisthesis, lumbar region: Secondary | ICD-10-CM | POA: Diagnosis not present

## 2019-07-01 DIAGNOSIS — K581 Irritable bowel syndrome with constipation: Secondary | ICD-10-CM | POA: Diagnosis not present

## 2019-07-01 DIAGNOSIS — M8580 Other specified disorders of bone density and structure, unspecified site: Secondary | ICD-10-CM | POA: Diagnosis not present

## 2019-07-01 DIAGNOSIS — M47812 Spondylosis without myelopathy or radiculopathy, cervical region: Secondary | ICD-10-CM | POA: Diagnosis not present

## 2019-07-01 DIAGNOSIS — D510 Vitamin B12 deficiency anemia due to intrinsic factor deficiency: Secondary | ICD-10-CM | POA: Diagnosis not present

## 2019-07-01 DIAGNOSIS — D181 Lymphangioma, any site: Secondary | ICD-10-CM | POA: Diagnosis not present

## 2019-07-01 DIAGNOSIS — G2581 Restless legs syndrome: Secondary | ICD-10-CM | POA: Diagnosis not present

## 2019-07-01 DIAGNOSIS — Z9181 History of falling: Secondary | ICD-10-CM | POA: Diagnosis not present

## 2019-07-01 DIAGNOSIS — E785 Hyperlipidemia, unspecified: Secondary | ICD-10-CM | POA: Diagnosis not present

## 2019-07-01 DIAGNOSIS — Z792 Long term (current) use of antibiotics: Secondary | ICD-10-CM | POA: Diagnosis not present

## 2019-07-01 DIAGNOSIS — E034 Atrophy of thyroid (acquired): Secondary | ICD-10-CM | POA: Diagnosis not present

## 2019-07-01 DIAGNOSIS — E440 Moderate protein-calorie malnutrition: Secondary | ICD-10-CM | POA: Diagnosis not present

## 2019-07-01 DIAGNOSIS — Z8673 Personal history of transient ischemic attack (TIA), and cerebral infarction without residual deficits: Secondary | ICD-10-CM | POA: Diagnosis not present

## 2019-07-01 DIAGNOSIS — G20 Parkinson's disease: Secondary | ICD-10-CM | POA: Diagnosis not present

## 2019-07-01 DIAGNOSIS — M1612 Unilateral primary osteoarthritis, left hip: Secondary | ICD-10-CM | POA: Diagnosis not present

## 2019-07-01 DIAGNOSIS — M810 Age-related osteoporosis without current pathological fracture: Secondary | ICD-10-CM | POA: Diagnosis not present

## 2019-07-01 DIAGNOSIS — M216X9 Other acquired deformities of unspecified foot: Secondary | ICD-10-CM | POA: Diagnosis not present

## 2019-07-06 DIAGNOSIS — Z792 Long term (current) use of antibiotics: Secondary | ICD-10-CM | POA: Diagnosis not present

## 2019-07-06 DIAGNOSIS — Z8673 Personal history of transient ischemic attack (TIA), and cerebral infarction without residual deficits: Secondary | ICD-10-CM | POA: Diagnosis not present

## 2019-07-06 DIAGNOSIS — G2581 Restless legs syndrome: Secondary | ICD-10-CM | POA: Diagnosis not present

## 2019-07-06 DIAGNOSIS — M810 Age-related osteoporosis without current pathological fracture: Secondary | ICD-10-CM | POA: Diagnosis not present

## 2019-07-06 DIAGNOSIS — M1612 Unilateral primary osteoarthritis, left hip: Secondary | ICD-10-CM | POA: Diagnosis not present

## 2019-07-06 DIAGNOSIS — Z9181 History of falling: Secondary | ICD-10-CM | POA: Diagnosis not present

## 2019-07-06 DIAGNOSIS — D510 Vitamin B12 deficiency anemia due to intrinsic factor deficiency: Secondary | ICD-10-CM | POA: Diagnosis not present

## 2019-07-06 DIAGNOSIS — E785 Hyperlipidemia, unspecified: Secondary | ICD-10-CM | POA: Diagnosis not present

## 2019-07-06 DIAGNOSIS — K581 Irritable bowel syndrome with constipation: Secondary | ICD-10-CM | POA: Diagnosis not present

## 2019-07-06 DIAGNOSIS — E440 Moderate protein-calorie malnutrition: Secondary | ICD-10-CM | POA: Diagnosis not present

## 2019-07-06 DIAGNOSIS — M47812 Spondylosis without myelopathy or radiculopathy, cervical region: Secondary | ICD-10-CM | POA: Diagnosis not present

## 2019-07-06 DIAGNOSIS — M8580 Other specified disorders of bone density and structure, unspecified site: Secondary | ICD-10-CM | POA: Diagnosis not present

## 2019-07-06 DIAGNOSIS — M216X9 Other acquired deformities of unspecified foot: Secondary | ICD-10-CM | POA: Diagnosis not present

## 2019-07-06 DIAGNOSIS — M4316 Spondylolisthesis, lumbar region: Secondary | ICD-10-CM | POA: Diagnosis not present

## 2019-07-06 DIAGNOSIS — D181 Lymphangioma, any site: Secondary | ICD-10-CM | POA: Diagnosis not present

## 2019-07-06 DIAGNOSIS — E034 Atrophy of thyroid (acquired): Secondary | ICD-10-CM | POA: Diagnosis not present

## 2019-07-06 DIAGNOSIS — G20 Parkinson's disease: Secondary | ICD-10-CM | POA: Diagnosis not present

## 2019-07-15 DIAGNOSIS — M810 Age-related osteoporosis without current pathological fracture: Secondary | ICD-10-CM | POA: Diagnosis not present

## 2019-07-15 DIAGNOSIS — Z9181 History of falling: Secondary | ICD-10-CM | POA: Diagnosis not present

## 2019-07-15 DIAGNOSIS — M4316 Spondylolisthesis, lumbar region: Secondary | ICD-10-CM | POA: Diagnosis not present

## 2019-07-15 DIAGNOSIS — G2581 Restless legs syndrome: Secondary | ICD-10-CM | POA: Diagnosis not present

## 2019-07-15 DIAGNOSIS — E034 Atrophy of thyroid (acquired): Secondary | ICD-10-CM | POA: Diagnosis not present

## 2019-07-15 DIAGNOSIS — G20 Parkinson's disease: Secondary | ICD-10-CM | POA: Diagnosis not present

## 2019-07-15 DIAGNOSIS — M47812 Spondylosis without myelopathy or radiculopathy, cervical region: Secondary | ICD-10-CM | POA: Diagnosis not present

## 2019-07-15 DIAGNOSIS — M216X9 Other acquired deformities of unspecified foot: Secondary | ICD-10-CM | POA: Diagnosis not present

## 2019-07-15 DIAGNOSIS — Z8673 Personal history of transient ischemic attack (TIA), and cerebral infarction without residual deficits: Secondary | ICD-10-CM | POA: Diagnosis not present

## 2019-07-15 DIAGNOSIS — E440 Moderate protein-calorie malnutrition: Secondary | ICD-10-CM | POA: Diagnosis not present

## 2019-07-15 DIAGNOSIS — M1612 Unilateral primary osteoarthritis, left hip: Secondary | ICD-10-CM | POA: Diagnosis not present

## 2019-07-15 DIAGNOSIS — K581 Irritable bowel syndrome with constipation: Secondary | ICD-10-CM | POA: Diagnosis not present

## 2019-07-15 DIAGNOSIS — M8580 Other specified disorders of bone density and structure, unspecified site: Secondary | ICD-10-CM | POA: Diagnosis not present

## 2019-07-15 DIAGNOSIS — Z792 Long term (current) use of antibiotics: Secondary | ICD-10-CM | POA: Diagnosis not present

## 2019-07-15 DIAGNOSIS — E785 Hyperlipidemia, unspecified: Secondary | ICD-10-CM | POA: Diagnosis not present

## 2019-07-15 DIAGNOSIS — D510 Vitamin B12 deficiency anemia due to intrinsic factor deficiency: Secondary | ICD-10-CM | POA: Diagnosis not present

## 2019-07-15 DIAGNOSIS — D181 Lymphangioma, any site: Secondary | ICD-10-CM | POA: Diagnosis not present

## 2019-08-02 ENCOUNTER — Telehealth: Payer: Self-pay | Admitting: Internal Medicine

## 2019-08-02 NOTE — Telephone Encounter (Signed)
Access[facility]urse Call: 07/30/2019 at 1:44pm  Patient called stating that she take OTC B12 and uses Magnesium Cream on her legs and would like to know if she can take a medication for her legs. She said that both legs feel numb but this has been going on for a few years now. (also asked is she could get a Multi-Vitamin?)

## 2019-08-02 NOTE — Telephone Encounter (Signed)
Would you like an OV? 

## 2019-08-03 NOTE — Telephone Encounter (Signed)
Is she talking about her RLS?

## 2019-08-03 NOTE — Telephone Encounter (Signed)
LVM for pt to call back.

## 2019-08-04 NOTE — Telephone Encounter (Signed)
Yes ok to take magnesium in addition to the creams

## 2019-08-04 NOTE — Telephone Encounter (Signed)
Pt aware.

## 2019-08-04 NOTE — Telephone Encounter (Signed)
Pt just wanted to know if she could take a magnesium supplement as well as the cream. She was not interested in meds right now. She did say it was her RLS

## 2019-08-29 ENCOUNTER — Other Ambulatory Visit: Payer: Self-pay | Admitting: Internal Medicine

## 2019-09-05 NOTE — Progress Notes (Signed)
Pt scheduled for visit today.  Pt thought it was a phone visit so didn't show up.  Did call during visit time to see if phone visit could be done but was asked to schedule in person where POA was present given issues with dementia, confusion, and hallucinations.  Scheduled for next week

## 2019-09-06 ENCOUNTER — Encounter: Payer: Self-pay | Admitting: Neurology

## 2019-09-06 ENCOUNTER — Telehealth: Payer: Self-pay

## 2019-09-06 ENCOUNTER — Other Ambulatory Visit: Payer: Self-pay

## 2019-09-06 ENCOUNTER — Encounter: Payer: Medicare Other | Admitting: Neurology

## 2019-09-06 VITALS — Ht 60.0 in | Wt 95.0 lb

## 2019-09-06 NOTE — Telephone Encounter (Signed)
Appointment set up for tomorrow

## 2019-09-06 NOTE — Progress Notes (Signed)
Virtual Visit via Video Note  I connected with Elizabeth [name] on 09/07/19 at  2:45 PM EDT by a video enabled telemedicine application and verified that I am speaking with the correct person using two identifiers.   I discussed the limitations of evaluation and management by telemedicine and the availability of in person appointments. The patient expressed understanding and agreed to proceed.  The patient is currently at home and I am in the office.  Her friend [name] is with her at home and helps with the visit.  No referring provider.    History of Present Illness: This is an acute visit for foot pain.  Bilateral foot pain, right worse than left.  She has chronic bilateral foot pain.  She states the foot pain has been going on for a while, but she had a couple of episodes of more severe foot pain last week, which is what triggered this visit.  Most of the right foot hurts, except for the heel.  She states she has pain on the top of the foot and the bottom.  She even has pain in between the toes.  She describes the pain as a sharp pain, achy pain and burning pain.  The pain makes it hard to walk on and increases the pain when she is walking.  She does have pain at rest.  She does follow with podiatry and the last time she saw Dr. Paulla Dolly was in January of this year.  She was not sure if he was open or not.  She also gets a pedicure once a month.  The podiatrist shaved off a callus on the left foot and a mild callus on the right foot.  She has taken some Tylenol but it is not that effective.  Tramadol does help.  She had some leftover from rehab when she was experiencing back pain.  Anxiety: She has a history of generalized anxiety.  She was on medication in the past, which she does not recall, but it is in her chart.  She does worry a lot about things and she wonders if anything would help.     Social History   Socioeconomic History  . Marital status: Single    Spouse name: Not on file  .  Number of children: 0  . Years of education: Not on file  . Highest education level: Some college, no degree  Occupational History  . Not on file  Social Needs  . Financial resource strain: Not on file  . Food insecurity    Worry: Not on file    Inability: Not on file  . Transportation needs    Medical: Not on file    Non-medical: Not on file  Tobacco Use  . Smoking status: Never Smoker  . Smokeless tobacco: Never Used  Substance and Sexual Activity  . Alcohol use: Yes    Alcohol/week: 1.0 standard drinks    Types: 1 Glasses of wine per week    Comment: 0.6 oz per week  . Drug use: No  . Sexual activity: Not on file  Lifestyle  . Physical activity    Days per week: Not on file    Minutes per session: Not on file  . Stress: Not on file  Relationships  . Social Herbalist on phone: Not on file    Gets together: Not on file    Attends religious service: Not on file    Active member of club or organization: Not on  file    Attends meetings of clubs or organizations: Not on file    Relationship status: Not on file  Other Topics Concern  . Not on file  Social History Narrative  . Not on file     Observations/Objective: Appears well in NAD Breathing normally Mood normal  Assessment and Plan:  See Problem List for Assessment and Plan of chronic medical problems.   Follow Up Instructions:    I discussed the assessment and treatment plan with the patient. The patient was provided an opportunity to ask questions and all were answered. The patient agreed with the plan and demonstrated an understanding of the instructions.   The patient was advised to call back or seek an in-person evaluation if the symptoms worsen or if the condition fails to improve as anticipated.    Binnie Rail, MD

## 2019-09-06 NOTE — Telephone Encounter (Signed)
Copied from [place] [id number]. Topic: General - Inquiry >> Sep 05, 2019  2:44 PM Virl Axe D wrote: Reason for CRM: Pt complained of ongoing foot pain. Stated she is unable to move across the room. She would like to know what Dr. Quay Burow suggests. Please advise.

## 2019-09-07 ENCOUNTER — Telehealth: Payer: Medicare Other | Admitting: Internal Medicine

## 2019-09-07 ENCOUNTER — Encounter: Payer: Self-pay | Admitting: Internal Medicine

## 2019-09-07 ENCOUNTER — Ambulatory Visit (INDEPENDENT_AMBULATORY_CARE_PROVIDER_SITE_OTHER): Payer: Medicare Other | Admitting: Internal Medicine

## 2019-09-07 DIAGNOSIS — M79671 Pain in right foot: Secondary | ICD-10-CM | POA: Diagnosis not present

## 2019-09-07 DIAGNOSIS — M79672 Pain in left foot: Secondary | ICD-10-CM

## 2019-09-07 DIAGNOSIS — F419 Anxiety disorder, unspecified: Secondary | ICD-10-CM

## 2019-09-07 MED ORDER — SERTRALINE HCL 25 MG PO TABS: 25.0000 mg | ORAL_TABLET | Freq: Every day | ORAL | 5 refills | Status: DC

## 2019-09-07 NOTE — Assessment & Plan Note (Signed)
Chronic in nature, but has had a couple of episodes of more severe pain last week I did not attempt to look at her feet virtually because I do not think that would help me assess the problem She does follow with podiatry and I advised that they are open and that she should call them for an appointment ?  Neuropathy-I am unsure if she has had an EMG in the past, but we could consider this.  We will have her follow-up with podiatry first She had leftover tramadol from rehab and she is now out of it.  I advised that we will not refill because of the increased risk of causing hallucinations Advised Tylenol 3 times daily-advised her to take this on a regular basis

## 2019-09-07 NOTE — Assessment & Plan Note (Signed)
Chronic in nature She has been on sertraline 25 mg nightly in the past-I am unsure why this was discontinued She is experiencing increased anxiety and she would benefit from a low-dose SSRI Will try sertraline 25 mg at bedtime again since she did seem to tolerate this Advised her to contact us if she has any side effects or concerns

## 2019-09-09 NOTE — Progress Notes (Signed)
Elizabeth [name] was seen today in follow up for Parkinsons disease with associated Parkinson's dementia.  Her nephew and POA is present and supplements the history. She is on Rytary, 145 mg, and is supposed to be taking 2 tablets in the morning, 2 in the afternoon (taking that at bedtime) and 1 in the evening (she is taking that at bedtime).    pt denies falls.  Pt denies lightheadedness, near syncope.  I expressed serious concerns last visit about her living situation, specifically that she was living alone.  She has had confusion and hallucinations.  She does not recognize that she is doing so.  She called [place] on May 27 requesting that the statement of her hallucinating be taken from her medical records.  She stated that "2 guys that I do not know are putting things on the walls and one guy wants to buy the house and the other guy wants to go out with her."  I also expressed concerns last visit about her tramadol, which could be contributing.  She did call Dr. Quay Burow office not long after our last visit asking for tramadol, but the refill was declined, and she was told to take Tylenol.  She reports "I don't have many of those anymore."  States that she will sometimes see people in Lynn's car and they aren't.  [name] is her neighbor.  She has 3 hours of comfort keeper daily 7 days per week, 10am-1pm.      ALLERGIES:   Allergies  Allergen Reactions  . Atorvastatin Other (See Comments)    REACTION: Felt funny (only way patient could describe)  . Crestor [Rosuvastatin Calcium] Other (See Comments)    Leg cramps, muscle aches  . Vesicare [Solifenacin]     constipation    CURRENT MEDICATIONS:  Outpatient Encounter Medications as of 09/13/2019  Medication Sig  . Carbidopa-Levodopa ER (RYTARY) 36.25-145 MG CPCR Take 1 tablet by mouth 4 (four) times daily. (Patient taking differently: Take 1 tablet by mouth 3 (three) times daily. 2 in AM, 2 AFTERNOON, 1 in PM)  . Cholecalciferol (VITAMIN D) 125 MCG (5000  UT) CAPS Take by mouth.  . levothyroxine (SYNTHROID, LEVOTHROID) 88 MCG tablet Take 1 tablet (88 mcg total) by mouth daily.  . sertraline (ZOLOFT) 25 MG tablet Take 1 tablet (25 mg total) by mouth at bedtime.  . vitamin B-12 (CYANOCOBALAMIN) 1000 MCG tablet Take 1 tablet (1,000 mcg total) by mouth daily.  . [DISCONTINUED] cephALEXin (KEFLEX) 250 MG capsule Take 1 capsule (250 mg total) by mouth 2 (two) times daily.   No facility-administered encounter medications on file as of 09/13/2019.     PAST MEDICAL HISTORY:   Past Medical History:  Diagnosis Date  . Abnormal Pap smear of vagina 09/2006   Dr Nori Riis  . Abnormality of gait 05/24/2015   PMH R occipital infarct B12 deficiency H/o polio affected left leg  In Physical Therapy to improve balance & strength through [facility]  . Arthritis 12/25/2015  . Cervicalgia 01/17/2009   MRI 03/27/16:  IMPRESSION: 1. At C3-4 there is a mild broad-based disc bulge. Moderate bilateral facet arthropathy and uncovertebral degenerative changes resulting in moderate bilateral foraminal stenosis. 2. At C5-6 there is a broad-based disc osteophyte complex. Bilateral uncovertebral degenerative changes and facet arthropathy resulting in moderate bilateral foraminal stenosis.  . Fall 09/11/2017  . History of CVA (cerebrovascular accident) 07/20/2012   Chronic R occipital infarct ; stable on CT 05/21/2012 .Dr Jannifer Franklin , Neurology   .  History of TIA (transient ischemic attack) 01/26/2008  . HYPERLIPIDEMIA 01/26/2008   NMR Lipoprofile 2009: LDL 135 (1704/911), HDL 62, TG 131. LDL goal = < 100; ideally < 70. MGF MI in 48s No FH CVA  . Hypothyroidism   . Intention tremor 12/08/2013   Onset after fall 04/2012   . Irritable bowel syndrome (IBS) 01/17/2009   Dr. Unice Cobble  . Osteopenia    PMH fracture heel, wrist  . Parkinson disease ([place]) 06/25/2016   Dr Tat  . Pernicious anemia   . Post-polio syndrome    minor problems L leg  . Restless leg syndrome 12/25/2015   Dr Tat   . Subdural hygroma 09/11/2017  . Swallowing disorder 03/04/2017  . Urinary incontinence 09/07/2008   Dr McDiarmid, Urology   . Vitamin B12 deficiency   . Weakness 06/24/2017    PAST SURGICAL HISTORY:   Past Surgical History:  Procedure Laterality Date  . BLADDER SUSPENSION  2000  . CATARACT EXTRACTION, BILATERAL     Dr. Gershon Crane  . no colonoscopy     "I never felt I needed one "  . YAG LASER APPLICATION Left    Dr. Gershon Crane    SOCIAL HISTORY:   Social History   Socioeconomic History  . Marital status: Single    Spouse name: Not on file  . Number of children: 0  . Years of education: Not on file  . Highest education level: Some college, no degree  Occupational History  . Not on file  Social Needs  . Financial resource strain: Not on file  . Food insecurity    Worry: Not on file    Inability: Not on file  . Transportation needs    Medical: Not on file    Non-medical: Not on file  Tobacco Use  . Smoking status: Never Smoker  . Smokeless tobacco: Never Used  Substance and Sexual Activity  . Alcohol use: Yes    Alcohol/week: 1.0 standard drinks    Types: 1 Glasses of wine per week    Comment: 0.6 oz per week  . Drug use: No  . Sexual activity: Not on file  Lifestyle  . Physical activity    Days per week: Not on file    Minutes per session: Not on file  . Stress: Not on file  Relationships  . Social Herbalist on phone: Not on file    Gets together: Not on file    Attends religious service: Not on file    Active member of club or organization: Not on file    Attends meetings of clubs or organizations: Not on file    Relationship status: Not on file  . Intimate partner violence    Fear of current or ex partner: Not on file    Emotionally abused: Not on file    Physically abused: Not on file    Forced sexual activity: Not on file  Other Topics Concern  . Not on file  Social History Narrative   One level home - alone   R handed   Caffeine - cup of  coffee every am / tea at night     FAMILY HISTORY:   Family Status  Relation Name Status  . MGF  Deceased at age 42  . Mother  Deceased       emphysema, HTN  . Father  Deceased       alzheimer's  . Brother  Deceased  drown  . MGM  (Not Specified)  . Neg Hx  (Not Specified)    ROS:  Review of Systems  Constitutional: Negative.   HENT: Negative.   Eyes: Negative.   Respiratory: Negative.   Cardiovascular: Negative.   Gastrointestinal: Negative.   Skin: Negative.     PHYSICAL EXAMINATION:    VITALS:   Vitals:   09/13/19 1501  BP: (!) 155/70  Pulse: 99  Temp: (!) 97 F (36.1 C)  SpO2: 96%  Weight: 78 lb (35.4 kg)  Height: 5' (1.524 m)   Wt Readings from Last 3 Encounters:  09/13/19 78 lb (35.4 kg)  09/06/19 95 lb (43.1 kg)  01/21/19 78 lb (35.4 kg)     GEN:  The patient appears stated age and is in NAD.  She is in 2 different slippers, one of which has a large hole in it and the toes are sticking out. HEENT:  Normocephalic, atraumatic.  The mucous membranes are moist. The superficial temporal arteries are without ropiness or tenderness. CV:  RRR Lungs:  CTAB Neck/HEME:  There are no carotid bruits bilaterally.  Neurological examination:  Orientation: The patient is alert and oriented x3. Cranial nerves: There is good facial symmetry with facial hypomimia. The speech is fluent and clear. Soft palate rises symmetrically and there is no tongue deviation. Hearing is intact to conversational tone. Sensation: Sensation is intact to light touch throughout Motor: Strength is at least antigravity x4.  Movement examination: Tone: There is normal tone in the upper and lower extremities. Abnormal movements: there is LE dyskinesia bilaterally Coordination:  There is no decremation with RAM's, with hand opening and closing her finger taps bilaterally. Gait and Station: The patient pushes off of the chair to arise.  She is given her walker.  She has a very stooped  posture because of significant thoracic arthritis.  She walks fairly well with the walker.   ASSESSMENT/PLAN:  1.  Parkinson's disease with Parkinson's disease dementia and Parkinson's related hallucinations/psychosis  -Patient will continue Rytary 145 mg, 2 tablets in the morning, 2 in the afternoon and 1 in the evening  -Patient again asks about going back on pramipexole.  She was told again, as she has been many previous visits that this would not be appropriate at her stage of Parkinson's.  Explained again that this increases confusion and hallucinations.  -Explained again that the patient should not be living on her own.  Her power of attorney was with her.  Explained that if the patient is in her own home, she needs caregivers more than 3 hours/day.  She freely admits that she often times will send them home on the weekend.  Patient should not be able to make these decisions on her own.  Nephew/power of attorney states that her neighbor has cameras in the patient's home and will watch over her.  Explained that this is just not enough, and patient needs 24 hour/day care.  -Patient having hallucinations with Parkinson's related psychosis.  She was agreeable to Nuplazid today.  Reviewed her last EKG.  Discussed risk, benefits, and side effects, including risk of prolongation of QT interval.  Patient and nephew expressed understanding.  She was given samples today.  Expresses that this could take up to 6 weeks to work.  They signed an access form today.  -DPR signed for neighbor [name] at patient/nephew request   2.  Follow up is anticipated in the next 4-6 months, sooner should new neurologic issues arise.  Much greater  than 50% of this visit was spent in counseling and coordinating care.  Total face to face time:  30 min  Cc:  Binnie Rail, MD

## 2019-09-13 ENCOUNTER — Encounter: Payer: Self-pay | Admitting: Neurology

## 2019-09-13 ENCOUNTER — Ambulatory Visit: Payer: Medicare Other | Admitting: Neurology

## 2019-09-13 ENCOUNTER — Other Ambulatory Visit: Payer: Self-pay

## 2019-09-13 VITALS — BP 155/70 | HR 99 | Temp 97.0°F | Ht 60.0 in | Wt 78.0 lb

## 2019-09-13 DIAGNOSIS — F0280 Dementia in other diseases classified elsewhere without behavioral disturbance: Secondary | ICD-10-CM

## 2019-09-13 DIAGNOSIS — R441 Visual hallucinations: Secondary | ICD-10-CM

## 2019-09-13 DIAGNOSIS — G20 Parkinson's disease: Secondary | ICD-10-CM

## 2019-09-13 DIAGNOSIS — G2581 Restless legs syndrome: Secondary | ICD-10-CM

## 2019-09-14 ENCOUNTER — Encounter: Payer: Self-pay | Admitting: *Deleted

## 2019-09-14 ENCOUNTER — Telehealth: Payer: Self-pay | Admitting: Neurology

## 2019-09-14 MED ORDER — NUPLAZID 34 MG PO CAPS: 34.0000 mg | ORAL_CAPSULE | Freq: Every day | ORAL | 0 refills | Status: DC

## 2019-09-14 NOTE — Progress Notes (Signed)
Fax sent to [facility] for drug prescribed by Dr. Alfonso Patten. Tat. Faxed to [phone]. Signed by patient's nephew who is her 1.

## 2019-09-14 NOTE — Addendum Note (Signed)
Addended by: Jesse Fall on: 09/14/2019 05:00 PM   Modules accepted: Orders

## 2019-09-14 NOTE — Telephone Encounter (Signed)
Patient left msg with after hours about requesting to speak with a nurse about some forms. Thanks!

## 2019-09-14 NOTE — Telephone Encounter (Signed)
[employer] do you know anything about this?

## 2019-09-14 NOTE — Telephone Encounter (Signed)
Spoke with patient this was already taken care of.

## 2019-09-14 NOTE — Telephone Encounter (Signed)
No

## 2019-10-19 ENCOUNTER — Telehealth: Payer: Self-pay | Admitting: Neurology

## 2019-10-19 NOTE — Telephone Encounter (Signed)
Patient said she saw a medication on TV the other day and wanted to know if she could try it. She said it went on the screen quickly so she doesn't know the name. Pharm on file is correct.Thanks!

## 2019-10-19 NOTE — Telephone Encounter (Signed)
Advised patient to find name for medication and reach back out to [place] as we can not advise on new medications with out the name of it. Patient agreed

## 2019-11-04 ENCOUNTER — Other Ambulatory Visit: Payer: Self-pay | Admitting: Neurology

## 2019-11-21 ENCOUNTER — Encounter: Payer: Self-pay | Admitting: *Deleted

## 2019-11-21 NOTE — Progress Notes (Addendum)
[name] KeyJacqulyn Bath - PA Case [id number] Need help? Call us at [phone] Status Sent to [facility] 36.25-145MG  er capsules Form [employer] Medicare Part D Electronic Prior Authorization Form (2017 NCPDP)  N/Aon December 28 This medication or product is on your plan's list of covered drugs. Prior authorization is not required at this time. If your pharmacy has questions regarding the processing of your prescription, please have them call the [employer] pharmacy help desk at [phone]. **Please note: Formulary lowering, tiering exception, cost reduction and prospective Medicare hospice reviews cannot be requested using this method of submission. Please contact us at [phone] instead.

## 2019-11-26 ENCOUNTER — Other Ambulatory Visit: Payer: Self-pay | Admitting: Internal Medicine

## 2019-11-28 ENCOUNTER — Other Ambulatory Visit: Payer: Self-pay | Admitting: Internal Medicine

## 2019-12-09 ENCOUNTER — Other Ambulatory Visit: Payer: Self-pay | Admitting: Internal Medicine

## 2019-12-15 ENCOUNTER — Telehealth: Payer: Self-pay | Admitting: Neurology

## 2019-12-15 ENCOUNTER — Telehealth: Payer: Self-pay

## 2019-12-15 NOTE — Telephone Encounter (Signed)
New message    Friend Rushie Nyhan calling    Why Dr. Quay Burow refuse medication Cephalexin to prevent UTI low dosage antibiotic?

## 2019-12-15 NOTE — Telephone Encounter (Signed)
Nuplazid has to go to a [facility] pharmacy, not [place] [facility].  Form is in the desk.  And nothing is going to help if meds are not directly given to her.  We have discussed with them many times that patient should not be living alone and should not be administering own meds.  Elizabeth [name], I think that perhaps we need to get APS involved unless you know of another method to get her some help.  I am worried about her living situation.  Can you assist?

## 2019-12-15 NOTE — Telephone Encounter (Signed)
I have never prescribed this for her - she was seeing urology  - they probably prescribed it for her.

## 2019-12-15 NOTE — Telephone Encounter (Signed)
Per [name] pt never started on the Nuplazid. [name] had no idea that she had been prescribed it. She believes may be the nephew was with pt at the last appt. Anyway, she is fine with starting on the medication. [name] would like to make Dr. Carles Collet aware that the pt has been having a hard time taking the nigh time dose of Rytary. Few reasons, pt don't want to take it, its a large pill, pt forgets. [name] does manage the meds for pt and does remind pt to take Rytary at bedtime. Pt just doesn't do it. Pt has not been taking the 4th dose in a while. Sent new prescription of Nuplazid to [facility]. Looks like samples were left for pt, but obviously never received them. Instructed [name] that Dr. Carles Collet would be made aware of our conversation and if there is anything she needed to add that I would call back. Otherwise, move forward with new Nuplazid med for hallucinations.

## 2019-12-15 NOTE — Telephone Encounter (Signed)
She should be on nuplazid (was supposed to be started after last visit) for hallucinations.  Is she taking that?  Is someone monitoring her meds (giving them directly to her)?

## 2019-12-15 NOTE — Telephone Encounter (Signed)
I did deny stating that pt should have an appointment since this is an antibiotic and not on her current med list. You would prefer appointment correct?

## 2019-12-15 NOTE — Telephone Encounter (Signed)
Elizabeth [name] [phone] called regarding this patient and her having more Hallucinations. She would like to speak with the Nurse regarding the Rytary medication and the Dosage. Please Call. Thank you

## 2019-12-16 NOTE — Telephone Encounter (Signed)
Will reach out to urology

## 2019-12-18 ENCOUNTER — Ambulatory Visit: Payer: Medicare Other

## 2019-12-20 NOTE — Telephone Encounter (Signed)
Please continue to follow and provide appropriate, safe recommendations

## 2019-12-20 NOTE — Telephone Encounter (Signed)
Sorry for delay, I spoke with nephew who isnt very involved with care but did say she receives some in-home care with [facility], Babette Relic supervising RN at [phone], he notes there are some days they dont provide care and the neighbor [name] goes on those days.  Nephew says that he and his aunt have never discussed her long term plans for care but he thinks she wants to remain home, I brought up the need for supportive care to help her with meds which will keep her home and he mentions that she does not want to pay for more in-home hours, I assured him this is the common dilemma.     I called [facility] for information about their services, they offer companion care not skilled nursing so they can't give meds but can give 'med reminders'. They also have the capacity to provide up to 24/7 companion care . I did not discuss pt with them b/c I was unsure of disclosure.   It seems pt option if remaining home is a higher level of in-home care to ensure medication is distributed, perhaps a different provider if necessary given constraints of companion care vs assisted living. I called neighbor [name] to try to gather more info to assess but she didn't answer, I LVM requesting a return call.

## 2019-12-21 ENCOUNTER — Telehealth: Payer: Self-pay | Admitting: Clinical

## 2019-12-21 NOTE — Telephone Encounter (Signed)
  12/19/19  I spoke with nephew who isnt very involved with care but did say she receives some in-home care with [facility], Babette Relic supervising RN at [phone], he notes there are some days they dont provide care and the neighbor [name] goes on those days.  Nephew says that he and his aunt have never discussed her long term plans for care but he thinks she wants to remain home, I brought up the need for supportive care to help her with meds which will keep her home and he mentions that she does not want to pay for more in-home hours, I assured him this is the common dilemma.      I called [facility] for information about their services, they offer companion care not skilled nursing so they can't give meds but can give 'med reminders'. They also have the capacity to provide up to 24/7 companion care . I did not discuss pt with them b/c I was unsure of disclosure.   It seems pt option if remaining home is a higher level of in-home care to ensure medication is distributed, perhaps a different provider if necessary given constraints of companion care vs assisted living. I called neighbor [name] to try to gather more info to assess but she didn't answer, I LVM requesting a return call.     12/21/19 I spoke with [name] who reports pt said that her (the pt) and nephew threw away the nuplazid samples. [name] reports that pt not willing to take med due to cost, reports that pt has financial resources but does not want to spend money due to wanting to leave money for nephews when she dies. [name] reports that pt does not take rytary as directed. [name] says that she has brought up moving to assisted living but that pt declines because she does not want the men living in the attic to take over the house so that the nephew won't inherit (this is delusional thinking, there aren't men living in the attic). [name] does not think that pt will agree to increased level of care in the home due to cost. I spoke with [name]  about value of APS to intervene help get pt the services she needs, whether it is to remain at home or move to assisted living. [name] agrees with referral and welcomes help. I will place referral to APS

## 2019-12-22 ENCOUNTER — Telehealth: Payer: Self-pay | Admitting: Clinical

## 2019-12-22 ENCOUNTER — Other Ambulatory Visit: Payer: Self-pay | Admitting: Neurology

## 2019-12-22 NOTE — Telephone Encounter (Signed)
LCSW completed request for investigation with Adult Protective Services, requested notice of outcome

## 2020-01-09 ENCOUNTER — Telehealth: Payer: Self-pay | Admitting: Clinical

## 2020-01-09 NOTE — Telephone Encounter (Signed)
I called pt, she can't remember why she called. I told her this is Dr Tat, her PD doctors office and asked if she needed something but she was confused and unable to articulate any concern/request and said that she will call back tomorrow. I will call APS to f/u on investigation

## 2020-01-09 NOTE — Telephone Encounter (Signed)
01/09/20 LCSW called [employer] to f/u on request for investigation, the case was accepted for investigation and assigned to [place] (SP?), I LVM on SW VM requesting return call.

## 2020-01-09 NOTE — Telephone Encounter (Signed)
Patient returned call to Sarah

## 2020-01-10 ENCOUNTER — Telehealth: Payer: Self-pay | Admitting: Clinical

## 2020-01-10 DIAGNOSIS — R441 Visual hallucinations: Secondary | ICD-10-CM

## 2020-01-10 DIAGNOSIS — F0280 Dementia in other diseases classified elsewhere without behavioral disturbance: Secondary | ICD-10-CM

## 2020-01-10 DIAGNOSIS — G20 Parkinson's disease: Secondary | ICD-10-CM

## 2020-01-10 NOTE — Telephone Encounter (Signed)
Neighbor [name] called to f/u on Nuplazid Rx, she tried to pick it up from [facility]. I reviewed notes to see it needs sent to [facility], contacted pharm rep to see if form has been submitted, awaiting reply

## 2020-01-10 NOTE — Telephone Encounter (Signed)
LCSW spoke with APS Intake, confirmed APS accepted the report and SW is assigned to the pt. Neighbor [name] confirmed the APS SW has visited pt. LVM with APS SW to discuss report findings.

## 2020-01-10 NOTE — Telephone Encounter (Signed)
Please follow. thanks

## 2020-01-10 NOTE — Telephone Encounter (Signed)
01/10/20 LCM with APS requesting a rtc

## 2020-01-11 NOTE — Telephone Encounter (Signed)
Did rep receive form?

## 2020-01-13 ENCOUNTER — Encounter: Payer: Self-pay | Admitting: Neurology

## 2020-01-13 NOTE — Telephone Encounter (Signed)
I spoke with the SW assigned to the case, she would not provide any information regarding intervention plan but she did request documentation that provider has recommended increased level of care, will forward request to Dr Tat

## 2020-01-13 NOTE — Telephone Encounter (Signed)
Faxed letter stating need for 24/7 care from Dr Tat to Jerilynn Mages, [facility] Social Worker   (fax [phone] [place] office,  [phone] [place] office)

## 2020-01-13 NOTE — Telephone Encounter (Signed)
Spoke with patient she is going to have Elizabeth [name] call the office back. Judson Roch has made the Rep aware the form has been sent over again

## 2020-01-13 NOTE — Telephone Encounter (Signed)
No he did not receive form

## 2020-01-13 NOTE — Telephone Encounter (Signed)
Please advise if rep lets you know that form is received

## 2020-01-16 NOTE — Telephone Encounter (Signed)
Patient's friend called to check on the status of this.

## 2020-01-16 NOTE — Telephone Encounter (Signed)
Got it! I started filling out the treatment plan ill place it in the folder for you to complete and sign.

## 2020-01-16 NOTE — Telephone Encounter (Signed)
Spoke with representative at [facility] and she confirmed that they have received the [place] form but needs to have a treatment plan completed.

## 2020-01-16 NOTE — Telephone Encounter (Signed)
I let him know on 2/19 that you faxed it, he hasn't yet confirmed receipt

## 2020-01-16 NOTE — Telephone Encounter (Signed)
Elizabeth [name] doesn't do these kind of things.  If you get a chance, call Gerald Stabs (or just email him - I have his email) and ask him exactly what is needed)

## 2020-01-17 NOTE — Telephone Encounter (Signed)
Spoke with patients nephew per DPR, he stated he completed forms back in October and a representative never called him back. He states he does not mind completing the forms but he does not know if he can afford the medication because Dr Tat told him it was expensive. He stated things seemed unorganized but if this medication will help his aunt then he will come and sign the forms for her. I advised him that I would leave them upfront for him and I will fax them once they are completed. I advised him that I will follow up with the paperwork and keep him informed as I hear back from from the company. He voiced understanding and stated he would come complete the forms.

## 2020-01-18 NOTE — Telephone Encounter (Signed)
Forms faxed

## 2020-01-20 ENCOUNTER — Other Ambulatory Visit: Payer: Self-pay | Admitting: Internal Medicine

## 2020-01-20 MED ORDER — NUPLAZID 34 MG PO CAPS: 34.0000 mg | ORAL_CAPSULE | Freq: Every day | ORAL | 0 refills | Status: DC

## 2020-01-20 NOTE — Telephone Encounter (Signed)
Checked the box and refaxed the form.

## 2020-01-20 NOTE — Telephone Encounter (Signed)
Hey, the rep msg this morning  "the form is missing the diagnosis box checked and a signature/ date in section 3.  I think my Patient access manager Jim left a message for the office"

## 2020-01-20 NOTE — Addendum Note (Signed)
Addended by: Ulice Brilliant T on: 01/20/2020 03:19 PM   Modules accepted: Orders

## 2020-01-20 NOTE — Telephone Encounter (Signed)
Contacted patients friend [name] and let her know that I have faxed over the patient assistance form to Nuplazid and I'm waiting to hear back. Also informed her that I sent rx of Nuplazid to [facility].   She voiced understanding. And stated the patient wanted to know how much the medication would cost her.

## 2020-01-24 ENCOUNTER — Telehealth: Payer: Self-pay | Admitting: Neurology

## 2020-01-24 NOTE — Telephone Encounter (Signed)
Left message with the after hour service on 01-24-20 @ 12:28 pm  Caller states she need to speak to the nurse in reference to a DX missing on the enrollment form from the patient  The fax number is [phone]   They did not get a telephone number

## 2020-01-24 NOTE — Telephone Encounter (Signed)
Faxed form this morning at 9am and will fax again.

## 2020-01-27 ENCOUNTER — Encounter: Payer: Self-pay | Admitting: Neurology

## 2020-01-27 NOTE — Progress Notes (Addendum)
Elizabeth [name] (Key: [id number]) Rx #[id number] Nuplazid 34MG  capsules   Form [employer] Medicare Part D Electronic Prior Authorization Form (2017 NCPDP) Created 16 hours ago Sent to Plan 2 hours ago Plan Response 2 hours ago Submit Clinical Questions 2 hours ago Determination Favorable 2 hours ago Per the Manufacturer, NUPLAZID (pimavanserin) is available through five specialty pharmacies: [place]  These pharmacies can offer guidance on financial and prior authorization assistance. If you need additional assistance please call [phone]. Please read the Full Prescribing Information, including Boxed WARNING.  Message from plan: Request Reference Number: [id number]. NUPLAZID CAP 34MG  is approved through 11/23/2020. Your patient may now fill this prescription and it will be covered.

## 2020-01-31 ENCOUNTER — Emergency Department (HOSPITAL_COMMUNITY): Payer: Medicare Other

## 2020-01-31 ENCOUNTER — Encounter (HOSPITAL_COMMUNITY): Payer: Self-pay | Admitting: Internal Medicine

## 2020-01-31 ENCOUNTER — Telehealth: Payer: Self-pay | Admitting: Neurology

## 2020-01-31 ENCOUNTER — Inpatient Hospital Stay (HOSPITAL_COMMUNITY)
Admission: EM | Admit: 2020-01-31 | Discharge: 2020-02-03 | DRG: 871 | Disposition: A | Discharge status: Home Health | Payer: Medicare Other | Attending: Internal Medicine | Admitting: Internal Medicine

## 2020-01-31 DIAGNOSIS — Z8673 Personal history of transient ischemic attack (TIA), and cerebral infarction without residual deficits: Secondary | ICD-10-CM

## 2020-01-31 DIAGNOSIS — R54 Age-related physical debility: Secondary | ICD-10-CM | POA: Diagnosis present

## 2020-01-31 DIAGNOSIS — N3000 Acute cystitis without hematuria: Secondary | ICD-10-CM | POA: Diagnosis not present

## 2020-01-31 DIAGNOSIS — I252 Old myocardial infarction: Secondary | ICD-10-CM | POA: Diagnosis not present

## 2020-01-31 DIAGNOSIS — E871 Hypo-osmolality and hyponatremia: Secondary | ICD-10-CM | POA: Diagnosis present

## 2020-01-31 DIAGNOSIS — Z79890 Hormone replacement therapy (postmenopausal): Secondary | ICD-10-CM

## 2020-01-31 DIAGNOSIS — G9341 Metabolic encephalopathy: Secondary | ICD-10-CM | POA: Diagnosis present

## 2020-01-31 DIAGNOSIS — D696 Thrombocytopenia, unspecified: Secondary | ICD-10-CM | POA: Diagnosis present

## 2020-01-31 DIAGNOSIS — G14 Postpolio syndrome: Secondary | ICD-10-CM | POA: Diagnosis present

## 2020-01-31 DIAGNOSIS — A4159 Other Gram-negative sepsis: Principal | ICD-10-CM | POA: Diagnosis present

## 2020-01-31 DIAGNOSIS — Z888 Allergy status to other drugs, medicaments and biological substances status: Secondary | ICD-10-CM | POA: Diagnosis not present

## 2020-01-31 DIAGNOSIS — M8580 Other specified disorders of bone density and structure, unspecified site: Secondary | ICD-10-CM | POA: Diagnosis present

## 2020-01-31 DIAGNOSIS — E440 Moderate protein-calorie malnutrition: Secondary | ICD-10-CM | POA: Diagnosis present

## 2020-01-31 DIAGNOSIS — G2581 Restless legs syndrome: Secondary | ICD-10-CM | POA: Diagnosis present

## 2020-01-31 DIAGNOSIS — E860 Dehydration: Secondary | ICD-10-CM | POA: Diagnosis present

## 2020-01-31 DIAGNOSIS — D649 Anemia, unspecified: Secondary | ICD-10-CM

## 2020-01-31 DIAGNOSIS — N390 Urinary tract infection, site not specified: Secondary | ICD-10-CM

## 2020-01-31 DIAGNOSIS — D510 Vitamin B12 deficiency anemia due to intrinsic factor deficiency: Secondary | ICD-10-CM | POA: Diagnosis present

## 2020-01-31 DIAGNOSIS — A419 Sepsis, unspecified organism: Secondary | ICD-10-CM | POA: Diagnosis not present

## 2020-01-31 DIAGNOSIS — D638 Anemia in other chronic diseases classified elsewhere: Secondary | ICD-10-CM | POA: Diagnosis present

## 2020-01-31 DIAGNOSIS — G20A1 Parkinson's disease without dyskinesia, without mention of fluctuations: Secondary | ICD-10-CM | POA: Diagnosis present

## 2020-01-31 DIAGNOSIS — Z20822 Contact with and (suspected) exposure to covid-19: Secondary | ICD-10-CM | POA: Diagnosis present

## 2020-01-31 DIAGNOSIS — E034 Atrophy of thyroid (acquired): Secondary | ICD-10-CM | POA: Diagnosis not present

## 2020-01-31 DIAGNOSIS — Z1624 Resistance to multiple antibiotics: Secondary | ICD-10-CM | POA: Diagnosis present

## 2020-01-31 DIAGNOSIS — Z681 Body mass index (BMI) 19 or less, adult: Secondary | ICD-10-CM | POA: Diagnosis not present

## 2020-01-31 DIAGNOSIS — Z803 Family history of malignant neoplasm of breast: Secondary | ICD-10-CM

## 2020-01-31 DIAGNOSIS — Z820 Family history of epilepsy and other diseases of the nervous system: Secondary | ICD-10-CM

## 2020-01-31 DIAGNOSIS — G20 Parkinson's disease: Secondary | ICD-10-CM | POA: Diagnosis present

## 2020-01-31 DIAGNOSIS — E876 Hypokalemia: Secondary | ICD-10-CM | POA: Diagnosis present

## 2020-01-31 DIAGNOSIS — Z79899 Other long term (current) drug therapy: Secondary | ICD-10-CM

## 2020-01-31 DIAGNOSIS — R269 Unspecified abnormalities of gait and mobility: Secondary | ICD-10-CM | POA: Diagnosis present

## 2020-01-31 DIAGNOSIS — E785 Hyperlipidemia, unspecified: Secondary | ICD-10-CM | POA: Diagnosis present

## 2020-01-31 DIAGNOSIS — E039 Hypothyroidism, unspecified: Secondary | ICD-10-CM | POA: Diagnosis present

## 2020-01-31 DIAGNOSIS — K589 Irritable bowel syndrome without diarrhea: Secondary | ICD-10-CM | POA: Diagnosis present

## 2020-01-31 DIAGNOSIS — Z825 Family history of asthma and other chronic lower respiratory diseases: Secondary | ICD-10-CM

## 2020-01-31 DIAGNOSIS — Z8249 Family history of ischemic heart disease and other diseases of the circulatory system: Secondary | ICD-10-CM

## 2020-01-31 LAB — COMPREHENSIVE METABOLIC PANEL
ALT: 9 U/L (ref 0–44)
AST: 26 U/L (ref 15–41)
Albumin: 3 g/dL — ABNORMAL LOW (ref 3.5–5.0)
Alkaline Phosphatase: 62 U/L (ref 38–126)
Anion gap: 7 (ref 5–15)
BUN: 20 mg/dL (ref 8–23)
CO2: 23 mmol/L (ref 22–32)
Calcium: 8.2 mg/dL — ABNORMAL LOW (ref 8.9–10.3)
Chloride: 102 mmol/L (ref 98–111)
Creatinine, Ser: 0.82 mg/dL (ref 0.44–1.00)
GFR calc Af Amer: >60 mL/min (ref >60)
GFR calc non Af Amer: >60 mL/min (ref >60)
Glucose, Bld: 126 mg/dL — ABNORMAL HIGH (ref 70–99)
Potassium: 3.3 mmol/L — ABNORMAL LOW (ref 3.5–5.1)
Sodium: 132 mmol/L — ABNORMAL LOW (ref 135–145)
Total Bilirubin: 1.7 mg/dL — ABNORMAL HIGH (ref 0.3–1.2)
Total Protein: 5.5 g/dL — ABNORMAL LOW (ref 6.5–8.1)

## 2020-01-31 LAB — CBC WITH DIFFERENTIAL/PLATELET
Abs Immature Granulocytes: 0.04 10*3/uL (ref 0.00–0.07)
Basophils Absolute: 0 10*3/uL (ref 0.0–0.1)
Basophils Relative: 0 %
Eosinophils Absolute: 0 10*3/uL (ref 0.0–0.5)
Eosinophils Relative: 0 %
HCT: 31.5 % — ABNORMAL LOW (ref 36.0–46.0)
Hemoglobin: 10.5 g/dL — ABNORMAL LOW (ref 12.0–15.0)
Immature Granulocytes: 1 %
Lymphocytes Relative: 4 %
Lymphs Abs: 0.3 10*3/uL — ABNORMAL LOW (ref 0.7–4.0)
MCH: 29.5 pg (ref 26.0–34.0)
MCHC: 33.3 g/dL (ref 30.0–36.0)
MCV: 88.5 fL (ref 80.0–100.0)
Monocytes Absolute: 0.6 10*3/uL (ref 0.1–1.0)
Monocytes Relative: 7 %
Neutro Abs: 7.4 10*3/uL (ref 1.7–7.7)
Neutrophils Relative %: 88 %
Platelets: 146 10*3/uL — ABNORMAL LOW (ref 150–400)
RBC: 3.56 MIL/uL — ABNORMAL LOW (ref 3.87–5.11)
RDW: 14.7 % (ref 11.5–15.5)
WBC: 8.4 10*3/uL (ref 4.0–10.5)
nRBC: 0 % (ref 0.0–0.2)

## 2020-01-31 LAB — URINALYSIS, ROUTINE W REFLEX MICROSCOPIC
Bilirubin Urine: NEGATIVE
Glucose, UA: NEGATIVE mg/dL
Ketones, ur: 5 mg/dL — AB
Nitrite: POSITIVE — AB
Protein, ur: 100 mg/dL — AB
Specific Gravity, Urine: 1.014 (ref 1.005–1.030)
WBC, UA: >50 WBC/hpf — ABNORMAL HIGH (ref 0–5)
pH: 9 — ABNORMAL HIGH (ref 5.0–8.0)

## 2020-01-31 LAB — LACTIC ACID, PLASMA: Lactic Acid, Venous: 1.2 mmol/L (ref 0.5–1.9)

## 2020-01-31 LAB — PROTIME-INR
INR: 1.1 (ref 0.8–1.2)
Prothrombin Time: 14.6 seconds (ref 11.4–15.2)

## 2020-01-31 LAB — POC SARS CORONAVIRUS 2 AG -  ED: SARS Coronavirus 2 Ag: NEGATIVE

## 2020-01-31 LAB — APTT: aPTT: 37 seconds — ABNORMAL HIGH (ref 24–36)

## 2020-01-31 MED ORDER — CARBIDOPA-LEVODOPA ER 36.25-145 MG PO CPCR
1.0000 | ORAL_CAPSULE | ORAL | Status: DC
  Administered 2020-02-01: 1 via ORAL
  Filled 2020-01-31 (×2): qty 1

## 2020-01-31 MED ORDER — LACTATED RINGERS IV BOLUS (SEPSIS)
1000.0000 mL | Freq: Once | INTRAVENOUS | Status: AC
  Administered 2020-01-31: 1000 mL via INTRAVENOUS

## 2020-01-31 MED ORDER — POTASSIUM CHLORIDE IN NACL 20-0.9 MEQ/L-% IV SOLN
INTRAVENOUS | Status: DC
  Filled 2020-01-31 (×2): qty 1000

## 2020-01-31 MED ORDER — ENOXAPARIN SODIUM 300 MG/3ML IJ SOLN
20.0000 mg | INTRAMUSCULAR | Status: DC
  Administered 2020-02-01 – 2020-02-02 (×2): 20 mg via SUBCUTANEOUS
  Filled 2020-01-31 (×4): qty 0.2

## 2020-01-31 MED ORDER — SODIUM CHLORIDE 0.9 % IV SOLN
2.0000 g | INTRAVENOUS | Status: DC
  Administered 2020-02-01: 2 g via INTRAVENOUS
  Filled 2020-01-31 (×2): qty 2

## 2020-01-31 MED ORDER — SERTRALINE HCL 25 MG PO TABS
25.0000 mg | ORAL_TABLET | Freq: Every day | ORAL | Status: DC
  Administered 2020-01-31 – 2020-02-02 (×3): 25 mg via ORAL
  Filled 2020-01-31 (×3): qty 1

## 2020-01-31 MED ORDER — CARBIDOPA-LEVODOPA ER 36.25-145 MG PO CPCR
2.0000 | ORAL_CAPSULE | ORAL | Status: DC
  Administered 2020-02-02 – 2020-02-03 (×3): 2 via ORAL

## 2020-01-31 MED ORDER — PIMAVANSERIN TARTRATE 34 MG PO CAPS
34.0000 mg | ORAL_CAPSULE | Freq: Every day | ORAL | Status: DC
  Administered 2020-02-02 – 2020-02-03 (×2): 34 mg via ORAL
  Filled 2020-01-31 (×3): qty 1

## 2020-01-31 MED ORDER — VITAMIN D 125 MCG (5000 UT) PO CAPS: 1.0000 | ORAL_CAPSULE | Freq: Every day | ORAL | Status: DC

## 2020-01-31 MED ORDER — ACETAMINOPHEN 500 MG PO TABS
500.0000 mg | ORAL_TABLET | Freq: Four times a day (QID) | ORAL | Status: DC | PRN
  Administered 2020-02-01: 500 mg via ORAL
  Filled 2020-01-31: qty 1

## 2020-01-31 MED ORDER — VITAMIN D 25 MCG (1000 UNIT) PO TABS
5000.0000 [IU] | ORAL_TABLET | Freq: Every day | ORAL | Status: DC
  Administered 2020-02-01 – 2020-02-03 (×3): 5000 [IU] via ORAL
  Filled 2020-01-31 (×3): qty 5

## 2020-01-31 MED ORDER — ACETAMINOPHEN 650 MG RE SUPP: 325.0000 mg | Freq: Four times a day (QID) | RECTAL | Status: DC | PRN

## 2020-01-31 MED ORDER — LACTATED RINGERS IV BOLUS (SEPSIS)
250.0000 mL | Freq: Once | INTRAVENOUS | Status: AC
  Administered 2020-01-31: 250 mL via INTRAVENOUS

## 2020-01-31 MED ORDER — SODIUM CHLORIDE 0.9 % IV SOLN
1.0000 g | INTRAVENOUS | Status: DC
  Administered 2020-01-31: 1 g via INTRAVENOUS
  Filled 2020-01-31: qty 10

## 2020-01-31 MED ORDER — LEVOTHYROXINE SODIUM 88 MCG PO TABS
88.0000 ug | ORAL_TABLET | Freq: Every day | ORAL | Status: DC
  Administered 2020-02-01 – 2020-02-03 (×3): 88 ug via ORAL
  Filled 2020-01-31 (×3): qty 1

## 2020-01-31 MED ORDER — SODIUM CHLORIDE 0.9 % IV SOLN
2.0000 g | Freq: Once | INTRAVENOUS | Status: AC
  Administered 2020-01-31: 2 g via INTRAVENOUS
  Filled 2020-01-31: qty 2

## 2020-01-31 NOTE — ED Notes (Signed)
Please call Joey Enberg (Nephew) at [phone] for updates

## 2020-01-31 NOTE — Progress Notes (Signed)
Pharmacy Antibiotic Note  Elizabeth [name] is a 86 y.o. female admitted on 01/31/2020 with evaluation of fever and confusion.  Pt has hx of parkinson's.  Per family pt recently diagnosed with UTI and was on keflex. Pharmacy has been consulted for cefepime dosing.  Plan: Cefepime 2gm IV x 1 in ED then 2gm q24h for CrCl < 80mls/min Follow renal function, cultures and clinical course  Weight: 77 lb 2.6 oz (35 kg)  Temp (24hrs), Avg:98.7 F (37.1 C), Min:98.7 F (37.1 C), Max:98.7 F (37.1 C)  Recent Labs  Lab 01/31/20 1108 01/31/20 1126  WBC  --  8.4  CREATININE  --  0.82  LATICACIDVEN 1.2  --     Estimated Creatinine Clearance: 27.2 mL/min (by C-G formula based on SCr of 0.82 mg/dL).    Allergies  Allergen Reactions  . Atorvastatin Other (See Comments)    REACTION: Felt funny (only way patient could describe)  . Crestor [Rosuvastatin Calcium] Other (See Comments)    Leg cramps, muscle aches  . Vesicare [Solifenacin]     constipation    Antimicrobials this admission: 3/9 CTX x1 3/9 cefepime >>  Dose adjustments this admission:   Microbiology results: 3/9 BCx:  3/9 UCx:  Thank you for allowing pharmacy to be a part of this patient's care.   Dolly Rias RPh 01/31/2020, 3:32 PM

## 2020-01-31 NOTE — ED Provider Notes (Signed)
[facility] DEPT Provider Note   CSN: [id number] Arrival date & time: 01/31/20  1045     History Fever, weakness  Elizabeth [name] is a 86 y.o. female.  HPI   Patient presents the emergency room for evaluation of fever and confusion with progressive weakness.  History was provided by the patient as well as her family member.  Patient lives at home.  She has history of Parkinson's disease causing some gait disturbance and tremulousness.  According to the family member the patient was diagnosed with a urinary tract infection recently.  She was on antibiotics and has continued on another antibiotic.  However in the last day or 2 she has had weakness and confusion.  She had a fever today.  She is feeling weak.  They called the ambulance to bring her in for further evaluation.  Past Medical History:  Diagnosis Date  . Abnormal Pap smear of vagina 09/2006   Dr Nori Riis  . Abnormality of gait 05/24/2015   PMH R occipital infarct B12 deficiency H/o polio affected left leg  In Physical Therapy to improve balance & strength through [facility]  . Arthritis 12/25/2015  . Cervicalgia 01/17/2009   MRI 03/27/16:  IMPRESSION: 1. At C3-4 there is a mild broad-based disc bulge. Moderate bilateral facet arthropathy and uncovertebral degenerative changes resulting in moderate bilateral foraminal stenosis. 2. At C5-6 there is a broad-based disc osteophyte complex. Bilateral uncovertebral degenerative changes and facet arthropathy resulting in moderate bilateral foraminal stenosis.  . Fall 09/11/2017  . History of CVA (cerebrovascular accident) 07/20/2012   Chronic R occipital infarct ; stable on CT 05/21/2012 .Dr Jannifer Franklin , Neurology   . History of TIA (transient ischemic attack) 01/26/2008  . HYPERLIPIDEMIA 01/26/2008   NMR Lipoprofile 2009: LDL 135 (1704/911), HDL 62, TG 131. LDL goal = < 100; ideally < 70. MGF MI in 24s No FH CVA  . Hypothyroidism   . Intention tremor 12/08/2013   Onset  after fall 04/2012   . Irritable bowel syndrome (IBS) 01/17/2009   Dr. Unice Cobble  . Osteopenia    PMH fracture heel, wrist  . Parkinson disease ([place]) 06/25/2016   Dr Tat  . Pernicious anemia   . Post-polio syndrome    minor problems L leg  . Restless leg syndrome 12/25/2015   Dr Tat  . Subdural hygroma 09/11/2017  . Swallowing disorder 03/04/2017  . Urinary incontinence 09/07/2008   Dr McDiarmid, Urology   . Vitamin B12 deficiency   . Weakness 06/24/2017    Patient Active Problem List   Diagnosis Date Noted  . Sepsis secondary to UTI ([place]) 01/31/2020  . Constipation 01/22/2019  . Malnutrition of moderate degree 12/17/2018  . Pars defect of lumbar spine 12/16/2018  . Compression fracture of L3 vertebra ([place]) 11/02/2018  . Right shoulder pain 11/02/2018  . Left groin pain 10/19/2018  . Low back pain 10/19/2018  . Dysuria 10/12/2018  . Osteoporosis 05/30/2018  . Poor posture 04/28/2018  . Physical deconditioning 04/28/2018  . Anxiety 03/02/2018  . Foot pain, bilateral 12/21/2017  . Bilateral leg edema 12/02/2017  . Overactive bladder 09/19/2017  . Post-polio syndrome   . Vitamin B12 deficiency   . Fall 09/11/2017  . Subdural hygroma 09/11/2017  . Swallowing disorder 03/04/2017  . Parkinson disease ([place]) 06/25/2016  . Restless leg syndrome 12/25/2015  . Arthritis 12/25/2015  . Abnormality of gait 05/24/2015  . History of CVA (cerebrovascular accident) 07/20/2012  . Cervicalgia 01/17/2009  . Irritable bowel  syndrome (IBS) 01/17/2009  . Urinary incontinence 09/07/2008  . Anemia 04/26/2008  . HYPERLIPIDEMIA 01/26/2008  . History of TIA (transient ischemic attack) 01/26/2008  . Hypothyroidism 01/04/2008  . Abnormal Pap smear of vagina 09/24/2006    Past Surgical History:  Procedure Laterality Date  . BLADDER SUSPENSION  2000  . CATARACT EXTRACTION, BILATERAL     Dr. Gershon Crane  . no colonoscopy     "I never felt I needed one "  . YAG LASER APPLICATION Left    Dr.  Gershon Crane     OB History   No obstetric history on file.     Family History  Problem Relation Age of Onset  . Heart attack Maternal Grandfather        70s  . Emphysema Mother   . Hypertension Mother   . Alzheimer's disease Father   . Breast cancer Maternal Grandmother   . Cancer Maternal Grandmother        breast  . Diabetes Neg Hx   . Stroke Neg Hx   . Parkinson's disease Neg Hx     Social History   Tobacco Use  . Smoking status: Never Smoker  . Smokeless tobacco: Never Used  Substance Use Topics  . Alcohol use: Yes    Alcohol/week: 1.0 standard drinks    Types: 1 Glasses of wine per week    Comment: 0.6 oz per week  . Drug use: No    Home Medications Prior to Admission medications   Medication Sig Start Date End Date Taking? Authorizing Provider  cephALEXin (KEFLEX) 250 MG capsule Take 250 mg by mouth daily. 01/13/20  Yes [provider]  Cholecalciferol (VITAMIN D) 125 MCG (5000 UT) CAPS Take 1 capsule by mouth daily.    Yes [provider]  levothyroxine (SYNTHROID) 88 MCG tablet TAKE 1 TABLET BY MOUTH DAILY. Patient taking differently: Take 88 mcg by mouth daily before breakfast.  01/20/20  Yes Burns, Claudina Lick, MD  Pimavanserin Tartrate (NUPLAZID) 34 MG CAPS Take 1 capsule (34 mg total) by mouth daily. 01/20/20  Yes Tat, Rebecca S, DO  RYTARY 36.25-145 MG CPCR TAKE 2 TABLETS EACH A.M., 2 IN THE AFTERNOON AND 1 TABLET EACH EVENING. Patient taking differently: Take 1-2 tablets by mouth See admin instructions. 2 tablets in AM, 2 tablets in afternoon and 1 tablet in evening 12/22/19  Yes Tat, Rebecca S, DO  sertraline (ZOLOFT) 25 MG tablet Take 1 tablet (25 mg total) by mouth at bedtime. 09/07/19  Yes Burns, Claudina Lick, MD  vitamin B-12 (CYANOCOBALAMIN) 1000 MCG tablet Take 1 tablet (1,000 mcg total) by mouth daily. 01/10/19  Yes Hennie Duos, MD    Allergies    Atorvastatin, Crestor [rosuvastatin calcium], and Vesicare [solifenacin]  Review of  Systems   Review of Systems  Constitutional: Positive for fever.  Respiratory: Negative for cough and shortness of breath.   Cardiovascular: Negative for chest pain.  Gastrointestinal: Negative for abdominal pain.  All other systems reviewed and are negative.   Physical Exam Updated Vital Signs BP (!) 122/54   Pulse 64   Temp 98.7 F (37.1 C) (Oral)   Resp 16   Wt 35 kg   SpO2 99%   BMI 15.07 kg/m   Physical Exam Vitals and nursing note reviewed.  Constitutional:      Appearance: She is well-developed. She is ill-appearing.     Comments: Frail, elderly  HENT:     Head: Normocephalic and atraumatic.     Right Ear:  External ear normal.     Left Ear: External ear normal.  Eyes:     General: No scleral icterus.       Right eye: No discharge.        Left eye: No discharge.     Conjunctiva/sclera: Conjunctivae normal.  Neck:     Trachea: No tracheal deviation.  Cardiovascular:     Rate and Rhythm: Normal rate and regular rhythm.  Pulmonary:     Effort: Pulmonary effort is normal. No respiratory distress.     Breath sounds: Normal breath sounds. No stridor. No wheezing or rales.  Abdominal:     General: Bowel sounds are normal. There is no distension.     Palpations: Abdomen is soft.     Tenderness: There is no abdominal tenderness. There is no guarding or rebound.  Musculoskeletal:        General: No tenderness.     Cervical back: Neck supple.  Skin:    General: Skin is warm and dry.     Findings: No rash.  Neurological:     Mental Status: She is alert.     Cranial Nerves: No cranial nerve deficit (no facial droop, extraocular movements intact, no slurred speech).     Sensory: No sensory deficit.     Motor: Weakness and tremor present. No abnormal muscle tone or seizure activity.     Coordination: Coordination normal.     Comments: Generalized weakness     ED Results / Procedures / Treatments   Labs (all labs ordered are listed, but only abnormal results are  displayed) Labs Reviewed  COMPREHENSIVE METABOLIC PANEL - Abnormal; Notable for the following components:      Result Value   Sodium 132 (*)    Potassium 3.3 (*)    Glucose, Bld 126 (*)    Calcium 8.2 (*)    Total Protein 5.5 (*)    Albumin 3.0 (*)    Total Bilirubin 1.7 (*)    All other components within normal limits  CBC WITH DIFFERENTIAL/PLATELET - Abnormal; Notable for the following components:   RBC 3.56 (*)    Hemoglobin 10.5 (*)    HCT 31.5 (*)    Platelets 146 (*)    Lymphs Abs 0.3 (*)    All other components within normal limits  APTT - Abnormal; Notable for the following components:   aPTT 37 (*)    All other components within normal limits  URINALYSIS, ROUTINE W REFLEX MICROSCOPIC - Abnormal; Notable for the following components:   Color, Urine AMBER (*)    APPearance CLOUDY (*)    pH 9.0 (*)    Hgb urine dipstick SMALL (*)    Ketones, ur 5 (*)    Protein, ur 100 (*)    Nitrite POSITIVE (*)    Leukocytes,Ua LARGE (*)    WBC, UA >50 (*)    Bacteria, UA RARE (*)    Non Squamous Epithelial 0-5 (*)    All other components within normal limits  CULTURE, BLOOD (ROUTINE X 2)  CULTURE, BLOOD (ROUTINE X 2)  URINE CULTURE  LACTIC ACID, PLASMA  PROTIME-INR  POC SARS CORONAVIRUS 2 AG -  ED    EKG EKG Interpretation  Date/Time:  Tuesday January 31 2020 11:30:00 EST Ventricular Rate:  84 PR Interval:    QRS Duration: 92 QT Interval:  361 QTC Calculation: 427 R Axis:   -23 Text Interpretation: Sinus rhythm Borderline left axis deviation No significant change since last tracing Confirmed by  Dorie Rank [id number]) on 01/31/2020 11:44:24 AM   Radiology DG Chest Port 1 View  Result Date: 01/31/2020 CLINICAL DATA:  Weakness, confusion EXAM: PORTABLE CHEST 1 VIEW COMPARISON:  12/16/2018 FINDINGS: The heart size and mediastinal contours are stable. No focal airspace consolidation, pleural effusion, or pneumothorax. Diffuse bony demineralization. No acute osseous findings.  IMPRESSION: No active cardiopulmonary disease. Electronically Signed   By: Davina Poke D.O.   On: 01/31/2020 12:18    Procedures Procedures (including critical care time)  Medications Ordered in ED Medications  lactated ringers bolus 1,000 mL (has no administration in time range)    And  lactated ringers bolus 250 mL (has no administration in time range)  cefTRIAXone (ROCEPHIN) 1 g in sodium chloride 0.9 % 100 mL IVPB (0 g Intravenous Stopped 01/31/20 1502)    ED Course  I have reviewed the triage vital signs and the nursing notes.  Pertinent labs & imaging results that were available during my care of the patient were reviewed by me and considered in my medical decision making (see chart for details).  Clinical Course as of Jan 31 1515  Tue Jan 31, 2020  1409 Pt more somnolent.  Difficult to arouse.   Vitals remain stable.  Will ct head.  Plan on admission for her UTI, ams.   [JK]    Clinical Course User Index [JK] Dorie Rank, MD   MDM Rules/Calculators/A&P                      Pt presented with confusion, weakness.  Labs notable for uti.  No signs of sepsis.  Pt started on iv fluids, abx.  Still somewhat somnolent so will add on head ct but suspect related to her uti.  Admit for further treatment. Final Clinical Impression(s) / ED Diagnoses Final diagnoses:  Acute cystitis without hematuria      Dorie Rank, MD 01/31/20 [phone]

## 2020-01-31 NOTE — H&P (Signed)
History and Physical    Elizabeth [name] [id number] DOB: [birth year] DOA: 01/31/2020  PCP: Binnie Rail, MD   Patient coming from: Home.  I have personally briefly reviewed patient's old medical records in [place]  Chief Complaint: Weakness, fever and confusion.  HPI: Elizabeth [name] is a 86 y.o. female with medical history significant of abnormal Pap smear, normal gait, osteoarthritis, cervicalgia, history of CVA, history of TIA, hyperlipidemia, hypothyroidism, post polio syndrome, intention tremor, Parkinson's disease, subdural hygroma, dysphagia, IBS, pernicious anemia, vitamin B12 deficiency who was brought by EMS today with a history of recently being treated with high-dose antibiotics and currently on an unknown UTI prophylactic antibiotic who was brought via EMS to the ED after her relative noticed that she was weak and confused.  She also had a fever earlier in the day.  She knows that she is at [facility], but has some trouble remembering the date and other details.  She denied having headache, sore throat, dyspnea, chest pain, back or abdominal pain, flank tenderness, nausea or vomiting.  ED Course: Initial vital signs were temperature 98.7, pulse 88, respirations 28, BP 113/56 mmHg and O2 sat 94% on room air.  The patient became hypotensive after arrival and was given 250 mL of LR bolus.  She was started on IV antibiotics after blood cultures were drawn.  Her urinalysis was cloudy with positive nitrates, large leukocyte esterase, WBC more than 50 with rare bacteria.  CBC showed a white count of 8.4 with 88% neutrophils, hemoglobin 10.5 g/dL and platelets 146.  PT was 14.6, INR 1.1 and APTT 37.  Sodium 132, potassium 3.3 mmol/L.  [name] other electrolytes are within normal limits when calcium is corrected to albumin.  Renal function was normal.  Bilirubin was 1.7 and glucose 126 mg/dL.  Liver enzymes are normal.  Total protein was 5.5 and albumin 3.0 g/dL.  Lactic acid was  normal.  Her SARS antigen was negative.  Review of Systems: As per HPI otherwise 10 point review of systems negative.   Past Medical History:  Diagnosis Date  . Abnormal Pap smear of vagina 09/2006   Dr Nori Riis  . Abnormality of gait 05/24/2015   PMH R occipital infarct B12 deficiency H/o polio affected left leg  In Physical Therapy to improve balance & strength through [facility]  . Arthritis 12/25/2015  . Cervicalgia 01/17/2009   MRI 03/27/16:  IMPRESSION: 1. At C3-4 there is a mild broad-based disc bulge. Moderate bilateral facet arthropathy and uncovertebral degenerative changes resulting in moderate bilateral foraminal stenosis. 2. At C5-6 there is a broad-based disc osteophyte complex. Bilateral uncovertebral degenerative changes and facet arthropathy resulting in moderate bilateral foraminal stenosis.  . Fall 09/11/2017  . History of CVA (cerebrovascular accident) 07/20/2012   Chronic R occipital infarct ; stable on CT 05/21/2012 .Dr Jannifer Franklin , Neurology   . History of TIA (transient ischemic attack) 01/26/2008  . HYPERLIPIDEMIA 01/26/2008   NMR Lipoprofile 2009: LDL 135 (1704/911), HDL 62, TG 131. LDL goal = < 100; ideally < 70. MGF MI in 57s No FH CVA  . Hypothyroidism   . Intention tremor 12/08/2013   Onset after fall 04/2012   . Irritable bowel syndrome (IBS) 01/17/2009   Dr. Unice Cobble  . Osteopenia    PMH fracture heel, wrist  . Parkinson disease ([place]) 06/25/2016   Dr Tat  . Pernicious anemia   . Post-polio syndrome    minor problems L leg  . Restless leg syndrome  12/25/2015   Dr Tat  . Subdural hygroma 09/11/2017  . Swallowing disorder 03/04/2017  . Urinary incontinence 09/07/2008   Dr McDiarmid, Urology   . Vitamin B12 deficiency   . Weakness 06/24/2017    Past Surgical History:  Procedure Laterality Date  . BLADDER SUSPENSION  2000  . CATARACT EXTRACTION, BILATERAL     Dr. Gershon Crane  . no colonoscopy     "I never felt I needed one "  . YAG LASER APPLICATION Left    Dr.  Gershon Crane     reports that she has never smoked. She has never used smokeless tobacco. She reports current alcohol use of about 1.0 standard drinks of alcohol per week. She reports that she does not use drugs.  Allergies  Allergen Reactions  . Atorvastatin Other (See Comments)    REACTION: Felt funny (only way patient could describe)  . Crestor [Rosuvastatin Calcium] Other (See Comments)    Leg cramps, muscle aches  . Vesicare [Solifenacin]     constipation    Family History  Problem Relation Age of Onset  . Heart attack Maternal Grandfather        70s  . Emphysema Mother   . Hypertension Mother   . Alzheimer's disease Father   . Breast cancer Maternal Grandmother   . Cancer Maternal Grandmother        breast  . Diabetes Neg Hx   . Stroke Neg Hx   . Parkinson's disease Neg Hx    Prior to Admission medications   Medication Sig Start Date End Date Taking? Authorizing Provider  cephALEXin (KEFLEX) 250 MG capsule Take 250 mg by mouth daily. 01/13/20  Yes [provider]  Cholecalciferol (VITAMIN D) 125 MCG (5000 UT) CAPS Take 1 capsule by mouth daily.    Yes [provider]  levothyroxine (SYNTHROID) 88 MCG tablet TAKE 1 TABLET BY MOUTH DAILY. Patient taking differently: Take 88 mcg by mouth daily before breakfast.  01/20/20  Yes Burns, Claudina Lick, MD  Pimavanserin Tartrate (NUPLAZID) 34 MG CAPS Take 1 capsule (34 mg total) by mouth daily. 01/20/20  Yes Tat, Rebecca S, DO  RYTARY 36.25-145 MG CPCR TAKE 2 TABLETS EACH A.M., 2 IN THE AFTERNOON AND 1 TABLET EACH EVENING. Patient taking differently: Take 1-2 tablets by mouth See admin instructions. 2 tablets in AM, 2 tablets in afternoon and 1 tablet in evening 12/22/19  Yes Tat, Rebecca S, DO  sertraline (ZOLOFT) 25 MG tablet Take 1 tablet (25 mg total) by mouth at bedtime. 09/07/19  Yes Burns, Claudina Lick, MD  vitamin B-12 (CYANOCOBALAMIN) 1000 MCG tablet Take 1 tablet (1,000 mcg total) by mouth daily. 01/10/19  Yes Hennie Duos, MD    Physical Exam: Vitals:   01/31/20 1415 01/31/20 1430 01/31/20 1506 01/31/20 1543  BP:  (!) 122/54  122/67  Pulse: 69 64  79  Resp: 18 16  18   Temp:      TempSrc:      SpO2: 98% 99%  93%  Weight:   35 kg     Constitutional: Frail, under nourished and ill looking elderly female. Eyes: PERRL, lids and conjunctivae are mildly injected. ENMT: Mucous membranes are dry. Posterior pharynx clear of any exudate or lesions.Normal dentition.  Neck: normal, supple, no masses, no thyromegaly Respiratory: clear to auscultation bilaterally, no wheezing, no crackles. Normal respiratory effort. No accessory muscle use.  Cardiovascular: Regular rate and rhythm, no murmurs / rubs / gallops. No extremity edema. 2+ pedal pulses. No carotid  bruits.  Abdomen: Nondistended.  BS positive, soft, no tenderness, no masses palpated. No hepatosplenomegaly. Musculoskeletal: Generalized weakness.  No clubbing / cyanosis.  Good ROM, no contractures. Normal muscle tone.  Skin: Some areas of ecchymosis on extremities. Neurologic: CN 2-12 grossly intact. Sensation intact, DTR normal. Strength 5/5 in [name] 4.  Psychiatric: Alert and oriented x 2, disoriented to date and partially situation..  Labs on Admission: I have personally reviewed following labs and imaging studies  CBC: Recent Labs  Lab 01/31/20 1126  WBC 8.4  NEUTROABS 7.4  HGB 10.5*  HCT 31.5*  MCV 88.5  PLT 123456*   Basic Metabolic Panel: Recent Labs  Lab 01/31/20 1126  NA 132*  K 3.3*  CL 102  CO2 23  GLUCOSE 126*  BUN 20  CREATININE 0.82  CALCIUM 8.2*   GFR: Estimated Creatinine Clearance: 27.2 mL/min (by C-G formula based on SCr of 0.82 mg/dL). Liver Function Tests: Recent Labs  Lab 01/31/20 1126  AST 26  ALT 9  ALKPHOS 62  BILITOT 1.7*  PROT 5.5*  ALBUMIN 3.0*   No results for input(s): LIPASE, AMYLASE in the last 168 hours. No results for input(s): AMMONIA in the last 168 hours. Coagulation Profile: Recent  Labs  Lab 01/31/20 1126  INR 1.1   Cardiac Enzymes: No results for input(s): CKTOTAL, CKMB, CKMBINDEX, TROPONINI in the last 168 hours. BNP (last 3 results) No results for input(s): PROBNP in the last 8760 hours. HbA1C: No results for input(s): HGBA1C in the last 72 hours. CBG: No results for input(s): GLUCAP in the last 168 hours. Lipid Profile: No results for input(s): CHOL, HDL, LDLCALC, TRIG, CHOLHDL, LDLDIRECT in the last 72 hours. Thyroid Function Tests: No results for input(s): TSH, T4TOTAL, FREET4, T3FREE, THYROIDAB in the last 72 hours. Anemia Panel: No results for input(s): VITAMINB12, FOLATE, FERRITIN, TIBC, IRON, RETICCTPCT in the last 72 hours. Urine analysis:    Component Value Date/Time   COLORURINE AMBER (A) 01/31/2020 1126   APPEARANCEUR CLOUDY (A) 01/31/2020 1126   LABSPEC 1.014 01/31/2020 1126   PHURINE 9.0 (H) 01/31/2020 1126   GLUCOSEU NEGATIVE 01/31/2020 1126   GLUCOSEU NEGATIVE 11/15/2014 1537   HGBUR SMALL (A) 01/31/2020 1126   BILIRUBINUR NEGATIVE 01/31/2020 1126   BILIRUBINUR negative 10/12/2018 1115   KETONESUR 5 (A) 01/31/2020 1126   PROTEINUR 100 (A) 01/31/2020 1126   UROBILINOGEN negative (A) 10/12/2018 1115   UROBILINOGEN 0.2 11/15/2014 1537   NITRITE POSITIVE (A) 01/31/2020 1126   LEUKOCYTESUR LARGE (A) 01/31/2020 1126    Radiological Exams on Admission: CT Head Wo Contrast  Result Date: 01/31/2020 CLINICAL DATA:  Mental status change. Fever and confusion. Progressive weakness. EXAM: CT HEAD WITHOUT CONTRAST TECHNIQUE: Contiguous axial images were obtained from the base of the skull through the vertex without intravenous contrast. COMPARISON:  Head CT 12/16/2018 FINDINGS: Brain: No evidence of acute infarction, hemorrhage, hydrocephalus, extra-axial collection or mass lesion/mass effect. Stable degree of atrophy. Advanced periventricular and deep white matter hypodensity consistent with chronic small vessel ischemia, unchanged. Remote lacunar  infarcts in the bilateral caudate and left basal ganglia. Remote right occipital infarct. Vascular: Atherosclerosis of skullbase vasculature without hyperdense vessel or abnormal calcification. Skull: No fracture or focal lesion. Sinuses/Orbits: Paranasal sinuses and mastoid air cells are clear. The visualized orbits are unremarkable. Bilateral cataract resection. Other: None. IMPRESSION: 1. No acute intracranial abnormality. 2. Stable atrophy, chronic vessel ischemia and remote infarcts. Electronically Signed   By: Keith Rake M.D.   On: 01/31/2020 15:44  DG Chest Port 1 View  Result Date: 01/31/2020 CLINICAL DATA:  Weakness, confusion EXAM: PORTABLE CHEST 1 VIEW COMPARISON:  12/16/2018 FINDINGS: The heart size and mediastinal contours are stable. No focal airspace consolidation, pleural effusion, or pneumothorax. Diffuse bony demineralization. No acute osseous findings. IMPRESSION: No active cardiopulmonary disease. Electronically Signed   By: Davina Poke D.O.   On: 01/31/2020 12:18    EKG: Independently reviewed.  Vent. rate 84 BPM PR interval * ms QRS duration 92 ms QT/QTc 361/427 ms P-R-T axes 70 -23 41 Sinus rhythm Borderline left axis deviation No essential changes since previous ECG.  Assessment/Plan Principal Problem:   Sepsis secondary to UTI ([place]) Admit to telemetry/inpatient. Continue gentle IV hydration. Cefepime per pharmacy. CBC and CMP in a.m. Follow-up blood culture and sensitivity. Follow-up urine culture and sensitivity.  Active Problems:   Hypothyroidism Continue levothyroxine 88 mcg p.o. daily.    Normocytic anemia Monitor H&H. Transfuse as needed.    Parkinson disease ([place]) Continue carbidopa/levodopa. Continue Nuplazid.    Malnutrition of moderate degree Consult nutritional services.    Hypokalemia Supplementing through IV. Also received IV supplementation. Follow-up potassium level in a.m.    Hyponatremia Replacing. Follow sodium  level in a.m.   DVT prophylaxis: Lovenox SQ. Code Status: Full. Family Communication: Disposition Plan: Admit for IV antibiotic therapy for 2 to 3 days. Consults called: Admission status: Inpatient/telemetry.   Reubin Milan MD [facility]  If 7PM-7AM, please contact night-coverage [email or web address]  01/31/2020, 4:03 PM   This document was prepared using Dragon voice recognition software and may contain some unintended transcription errors.

## 2020-01-31 NOTE — Telephone Encounter (Signed)
Pt has been on the Nuplazid for about 3 days and she states that she does not know where she is or can not stand they want to speak to someone about this

## 2020-01-31 NOTE — Telephone Encounter (Signed)
Spoke with Dr Tat and she states that it sounds like the patient has a urinary tract infection and the patient should follow the instructions of the urologist. And her symptoms are not from [place].   [name] voiced understanding and stated the patient is on her way to the ED right now. And thanked me for calling.

## 2020-01-31 NOTE — Telephone Encounter (Signed)
Spoke with patients friend [name], she states patient started Nuplazid on Saturday. Since taking three doses of this medication patient has been shaking real bad an unable to stand. She states the patient lives alone and had a home health aid take care of her for 3 hours a day. Caregiver stated patients urine is dark and urologist suggest the patient come in to do a urine sample or go to the ED. Advised patients friend that or office does not treat urine symptoms and they should do what the urologist recommends. She states the patient is confused and has a lot of weakness.  [name] states she understands, but wanted to know what Dr Tat suggests. She also wanted to know if this was a side effect of Nuplazid.

## 2020-01-31 NOTE — ED Triage Notes (Signed)
86 yo female from home recently treated with "high dose antibiotics" per EMS. Currently on maintenance dose of unknown antibiotic. Hx of UTI and Parkinson's. Pt arrives to ED today via EMS with increased weakness and confusion.

## 2020-02-01 ENCOUNTER — Other Ambulatory Visit: Payer: Self-pay

## 2020-02-01 DIAGNOSIS — E876 Hypokalemia: Secondary | ICD-10-CM

## 2020-02-01 DIAGNOSIS — E034 Atrophy of thyroid (acquired): Secondary | ICD-10-CM

## 2020-02-01 DIAGNOSIS — E871 Hypo-osmolality and hyponatremia: Secondary | ICD-10-CM

## 2020-02-01 LAB — COMPREHENSIVE METABOLIC PANEL
ALT: 17 U/L (ref 0–44)
AST: 24 U/L (ref 15–41)
Albumin: 2.8 g/dL — ABNORMAL LOW (ref 3.5–5.0)
Alkaline Phosphatase: 55 U/L (ref 38–126)
Anion gap: 8 (ref 5–15)
BUN: 19 mg/dL (ref 8–23)
CO2: 21 mmol/L — ABNORMAL LOW (ref 22–32)
Calcium: 8.2 mg/dL — ABNORMAL LOW (ref 8.9–10.3)
Chloride: 108 mmol/L (ref 98–111)
Creatinine, Ser: 0.74 mg/dL (ref 0.44–1.00)
GFR calc Af Amer: >60 mL/min (ref >60)
GFR calc non Af Amer: >60 mL/min (ref >60)
Glucose, Bld: 101 mg/dL — ABNORMAL HIGH (ref 70–99)
Potassium: 3.2 mmol/L — ABNORMAL LOW (ref 3.5–5.1)
Sodium: 137 mmol/L (ref 135–145)
Total Bilirubin: 1.3 mg/dL — ABNORMAL HIGH (ref 0.3–1.2)
Total Protein: 5.1 g/dL — ABNORMAL LOW (ref 6.5–8.1)

## 2020-02-01 LAB — CBC WITH DIFFERENTIAL/PLATELET
Abs Immature Granulocytes: 0.04 10*3/uL (ref 0.00–0.07)
Basophils Absolute: 0 10*3/uL (ref 0.0–0.1)
Basophils Relative: 0 %
Eosinophils Absolute: 0 10*3/uL (ref 0.0–0.5)
Eosinophils Relative: 0 %
HCT: 30.7 % — ABNORMAL LOW (ref 36.0–46.0)
Hemoglobin: 9.9 g/dL — ABNORMAL LOW (ref 12.0–15.0)
Immature Granulocytes: 1 %
Lymphocytes Relative: 7 %
Lymphs Abs: 0.5 10*3/uL — ABNORMAL LOW (ref 0.7–4.0)
MCH: 28.6 pg (ref 26.0–34.0)
MCHC: 32.2 g/dL (ref 30.0–36.0)
MCV: 88.7 fL (ref 80.0–100.0)
Monocytes Absolute: 0.5 10*3/uL (ref 0.1–1.0)
Monocytes Relative: 8 %
Neutro Abs: 6.1 10*3/uL (ref 1.7–7.7)
Neutrophils Relative %: 84 %
Platelets: 142 10*3/uL — ABNORMAL LOW (ref 150–400)
RBC: 3.46 MIL/uL — ABNORMAL LOW (ref 3.87–5.11)
RDW: 14.9 % (ref 11.5–15.5)
WBC: 7.2 10*3/uL (ref 4.0–10.5)
nRBC: 0 % (ref 0.0–0.2)

## 2020-02-01 LAB — MAGNESIUM: Magnesium: 1.8 mg/dL (ref 1.7–2.4)

## 2020-02-01 MED ORDER — POTASSIUM CHLORIDE IN NACL 20-0.9 MEQ/L-% IV SOLN
INTRAVENOUS | Status: DC
  Filled 2020-02-01: qty 1000

## 2020-02-01 MED ORDER — POTASSIUM CHLORIDE CRYS ER 20 MEQ PO TBCR
40.0000 meq | EXTENDED_RELEASE_TABLET | Freq: Once | ORAL | Status: AC
  Administered 2020-02-01: 40 meq via ORAL
  Filled 2020-02-01: qty 2

## 2020-02-01 NOTE — Progress Notes (Signed)
PROGRESS NOTE   Elizabeth [name]  EXH:[id number]    DOB: 1931/09/28    DOA: 01/31/2020  PCP: Binnie Rail, MD   I have briefly reviewed patients previous medical records in [facility].  Chief Complaint:   Chief Complaint  Patient presents with  . Weakness    Brief Narrative:  86 year old female with PMH of CVA, HLD, hypothyroid, Parkinson's disease, intention tremor, post polio syndrome, pernicious anemia, B12 deficiency presented to [facility] ED by EMS on 3/9 due to weakness, fever and confusion.  History of recently being treated with high-dose antibiotics and was on unknown prophylactic UTI antibiotic.  Vital signs stable initially in the ED but then became hypotensive and was bolused with IV fluids.  She was admitted for suspected sepsis due to partially treated UTI.  Improving pain   Assessment & Plan:  Principal Problem:   Sepsis secondary to UTI [facility]) Active Problems:   Hypothyroidism   Normocytic anemia   Parkinson disease ([facility])   Malnutrition of moderate degree   Hypokalemia   Hyponatremia   Sepsis secondary to partially treated UTI: Met sepsis criteria on admission.  Blood cultures x2: Negative to date.  Urine culture shows Proteus Penneri, sensitivities pending.  Continue empirically started IV cefepime until final culture results are back.  Continue gentle IV fluid hydration.  Acute metabolic encephalopathy: Likely secondary to UTI sepsis.  No focal deficits.  Seems to have resolved.  Monitor.  Hypokalemia: Replace and follow.  Magnesium normal.  Anemia: Suspect chronic disease.  Presented with hemoglobin of 10.5 which is dropped to 9.9 post hydration.  Follow CBC.  Thrombocytopenia: May be due to acute illness/infectious etiology.  Follow CBC in a.m.  Hypothyroid: Continue levothyroxine 88 mcg daily.  Parkinson's disease: Continue prior home medications including carbidopa/levodopa and Nuplazid.  Hyponatremia: Likely related to dehydration.   Resolved.  Body mass index is 14.16 kg/m.   DVT prophylaxis: Lovenox Code Status: Full Family Communication: None at bedside Disposition:  . Patient came from: Home           . Anticipated d/c place: Home . Barriers to d/c: Pending further clinical improvement and final urine culture sensitivity results to tailor antibiotics appropriately.  Moreover patient recently treated and failed outpatient UTI treatment hence may require inpatient IV treatment for a couple more days.   Consultants:   None  Procedures:   None  Antimicrobials:   IV cefepime 3/9 >   Subjective:  Patient interviewed and examined along with her female RN in room.  Reports that overall she feels much better.  Feels stronger.  Volunteers to urinary frequency but denies urgency, dysuria.  Did say that she had fever yesterday.  No pain reported  Objective:   Vitals:   02/01/20 0516 02/01/20 1200 02/01/20 1350 02/01/20 1623  BP: (!) 116/42  (!) 106/50   Pulse: 79  80   Resp: 20 (!) 21 (!) 22   Temp: 99.1 F (37.3 C)  99 F (37.2 C) 99.3 F (37.4 C)  TempSrc: Oral  Oral   SpO2: 95%  96%   Weight:      Height:        General exam: Pleasant elderly female, small built and thinly nourished, lying comfortably propped up in bed without distress. Respiratory system: Clear to auscultation. Respiratory effort normal. Cardiovascular system: S1 & S2 heard, RRR. No JVD, murmurs, rubs, gallops or clicks. No pedal edema.  Telemetry personally reviewed: Sinus rhythm.  Brief episode of NSVT  noted on 3/10 at 11:17 AM. Gastrointestinal system: Abdomen is nondistended, soft and nontender. No organomegaly or masses felt. Normal bowel sounds heard. Central nervous system: Alert and oriented x3. No focal neurological deficits. Extremities: Symmetric 5 x 5 power.  Left upper extremity parkinsonian tremor noted. Skin: No rashes, lesions or ulcers Psychiatry: Judgement and insight appear normal. Mood & affect appropriate.      Data Reviewed:   I have personally reviewed following labs and imaging studies   CBC: Recent Labs  Lab 01/31/20 1126 02/01/20 0452  WBC 8.4 7.2  NEUTROABS 7.4 6.1  HGB 10.5* 9.9*  HCT 31.5* 30.7*  MCV 88.5 88.7  PLT 146* 142*    Basic Metabolic Panel: Recent Labs  Lab 01/31/20 1126 02/01/20 0452  NA 132* 137  K 3.3* 3.2*  CL 102 108  CO2 23 21*  GLUCOSE 126* 101*  BUN 20 19  CREATININE 0.82 0.74  CALCIUM 8.2* 8.2*  MG  --  1.8    Liver Function Tests: Recent Labs  Lab 01/31/20 1126 02/01/20 0452  AST 26 24  ALT 9 17  ALKPHOS 62 55  BILITOT 1.7* 1.3*  PROT 5.5* 5.1*  ALBUMIN 3.0* 2.8*    CBG: No results for input(s): GLUCAP in the last 168 hours.  Microbiology Studies:   Recent Results (from the past 240 hour(s))  Blood Culture (routine x 2)     Status: None (Preliminary result)   Collection Time: 01/31/20 11:08 AM   Specimen: BLOOD  Result Value Ref Range Status   Specimen Description   Final    BLOOD LEFT ANTECUBITAL Performed at [facility] [address] [place], [place] [address]    Special Requests   Final    BOTTLES DRAWN AEROBIC AND ANAEROBIC Blood Culture adequate volume Performed at [facility] [address] [place], [place] [address]    Culture   Final    NO GROWTH < 24 HOURS Performed at [facility] [address] [place], [place] [address]    Report Status PENDING  Incomplete  Urine culture     Status: Abnormal (Preliminary result)   Collection Time: 01/31/20 11:26 AM   Specimen: In/Out Cath Urine  Result Value Ref Range Status   Specimen Description   Final    IN/OUT CATH URINE Performed at [facility] [address], [place], [place] [address]    Special Requests   Final    NONE Performed at [facility], [place] [address], [place], [place] [address]    Culture (A)  Final    >=100,000 COLONIES/mL PROTEUS  PENNERI SUSCEPTIBILITIES TO FOLLOW Performed at [facility], [place] [address] [place], [place] [address]    Report Status PENDING  Incomplete  Blood Culture (routine x 2)     Status: None (Preliminary result)   Collection Time: 01/31/20  3:19 PM   Specimen: BLOOD  Result Value Ref Range Status   Specimen Description   Final    BLOOD LEFT ANTECUBITAL Performed at [facility] [address], [place], [place] [address]    Special Requests   Final    BOTTLES DRAWN AEROBIC AND ANAEROBIC Blood Culture adequate volume Performed at [facility] [address], [place], [place] [address]    Culture   Final    NO GROWTH < 24 HOURS Performed at [facility] [address] [place], [place] [address]  Report Status PENDING  Incomplete     Radiology Studies:  No results found.   Scheduled Meds:   . Carbidopa-Levodopa ER  1 capsule Oral Q24H  . Carbidopa-Levodopa ER  2 capsule Oral 2 times per day  . cholecalciferol  5,000 Units Oral Daily  . enoxaparin (LOVENOX) injection  20 mg Subcutaneous Q24H  . levothyroxine  88 mcg Oral QAC breakfast  . Pimavanserin Tartrate  34 mg Oral Daily  . sertraline  25 mg Oral QHS    Continuous Infusions:   . 0.9 % NaCl with KCl 20 mEq / L 50 mL/hr at 01/31/20 1803  . ceFEPime (MAXIPIME) IV       LOS: 1 day     [place]ell Leep, MD, [place], [facility]    To contact the attending provider between 7A-7P or the covering provider during after hours 7P-7A, please log into the web site [email or web address] and access using universal [place] password for that web site. If you do not have the password, please call the hospital operator.  02/01/2020, 5:45 PM

## 2020-02-01 NOTE — Plan of Care (Signed)
  Problem: Nutrition: Goal: Adequate nutrition will be maintained Outcome: Progressing   Problem: Coping: Goal: Level of anxiety will decrease Outcome: Progressing   

## 2020-02-02 LAB — CBC WITH DIFFERENTIAL/PLATELET
Abs Immature Granulocytes: 0.07 10*3/uL (ref 0.00–0.07)
Basophils Absolute: 0 10*3/uL (ref 0.0–0.1)
Basophils Relative: 0 %
Eosinophils Absolute: 0.1 10*3/uL (ref 0.0–0.5)
Eosinophils Relative: 1 %
HCT: 30.6 % — ABNORMAL LOW (ref 36.0–46.0)
Hemoglobin: 9.7 g/dL — ABNORMAL LOW (ref 12.0–15.0)
Immature Granulocytes: 1 %
Lymphocytes Relative: 9 %
Lymphs Abs: 0.7 10*3/uL (ref 0.7–4.0)
MCH: 28.4 pg (ref 26.0–34.0)
MCHC: 31.7 g/dL (ref 30.0–36.0)
MCV: 89.5 fL (ref 80.0–100.0)
Monocytes Absolute: 0.7 10*3/uL (ref 0.1–1.0)
Monocytes Relative: 9 %
Neutro Abs: 6.4 10*3/uL (ref 1.7–7.7)
Neutrophils Relative %: 80 %
Platelets: 147 10*3/uL — ABNORMAL LOW (ref 150–400)
RBC: 3.42 MIL/uL — ABNORMAL LOW (ref 3.87–5.11)
RDW: 15.1 % (ref 11.5–15.5)
WBC: 8 10*3/uL (ref 4.0–10.5)
nRBC: 0 % (ref 0.0–0.2)

## 2020-02-02 LAB — BASIC METABOLIC PANEL
Anion gap: 8 (ref 5–15)
BUN: 18 mg/dL (ref 8–23)
CO2: 20 mmol/L — ABNORMAL LOW (ref 22–32)
Calcium: 8.2 mg/dL — ABNORMAL LOW (ref 8.9–10.3)
Chloride: 105 mmol/L (ref 98–111)
Creatinine, Ser: 0.68 mg/dL (ref 0.44–1.00)
GFR calc Af Amer: >60 mL/min (ref >60)
GFR calc non Af Amer: >60 mL/min (ref >60)
Glucose, Bld: 109 mg/dL — ABNORMAL HIGH (ref 70–99)
Potassium: 4.5 mmol/L (ref 3.5–5.1)
Sodium: 133 mmol/L — ABNORMAL LOW (ref 135–145)

## 2020-02-02 LAB — URINE CULTURE: Culture: >=100000 — AB

## 2020-02-02 MED ORDER — SULFAMETHOXAZOLE-TRIMETHOPRIM 400-80 MG PO TABS
1.0000 | ORAL_TABLET | Freq: Two times a day (BID) | ORAL | Status: DC
  Administered 2020-02-02 – 2020-02-03 (×2): 1 via ORAL
  Filled 2020-02-02 (×3): qty 1

## 2020-02-02 MED ORDER — CARBIDOPA-LEVODOPA ER 36.25-145 MG PO CPCR
1.0000 | ORAL_CAPSULE | Freq: Every day | ORAL | Status: DC
  Administered 2020-02-02: 1 via ORAL

## 2020-02-02 MED ORDER — SULFAMETHOXAZOLE-TRIMETHOPRIM 800-160 MG PO TABS: 1.0000 | ORAL_TABLET | Freq: Two times a day (BID) | ORAL | Status: DC

## 2020-02-02 NOTE — Evaluation (Addendum)
Physical Therapy Evaluation Patient Details Name: Elizabeth [name] MRN: [id number] DOB: 10/31/1931 Today's Date: 02/02/2020   History of Present Illness  86 year old female with PMH of CVA, HLD, hypothyroid, Parkinson's disease, intention tremor, post polio syndrome, pernicious anemia, B12 deficiency presented to [facility] ED by EMS on 3/9 due to weakness, fever and confusion.  History of recently being treated for UTI.  Vital signs stable initially in the ED but then became hypotensive .  She was admitted for suspected sepsis due to partially treated UTI  Clinical Impression  The patient  presents with decrease in functional independence as patient lived alone with PCA 3 hours/day. Patient did ambulate in room with RW and min to mod assist using RW. Patient required steady assist to balance while  Manipulating  briefs after toileting. A friend present to vouch that patient was essentially modified independent(does not drive). The patient reports having comfort keepers before and has gone to [facility].  Pt admitted with above diagnosis.  Pt currently with functional limitations due to the deficits listed below (see PT Problem List). Pt will benefit from skilled PT to increase their independence and safety with mobility to allow discharge to the venue listed below.   . Patient my benefit from a short term rehab prior to Dc. Recommend  Case management get involved to assist with DC planning.     Follow Up Recommendations SNF vs HHPT with 24/7 caregivers.    Equipment Recommendations  None recommended by PT    Recommendations for Other Services OT consult     Precautions / Restrictions Precautions Precautions: Fall      Mobility  Bed Mobility Overal bed mobility: Needs Assistance Bed Mobility: Supine to Sit     Supine to sit: Min assist     General bed mobility comments: extra time, assist with trunk  Transfers Overall transfer level: Needs assistance Equipment  used: Rolling walker (2 wheeled) Transfers: Sit to/from [facility] Sit to Stand: Mod assist Stand pivot transfers: Mod assist       General transfer comment: Patient stood and need BSc, BSC brought up. patient required mod assist to steday patient  while pulling up briefs.  Ambulation/Gait Ambulation/Gait assistance: Min assist;Mod assist Gait Distance (Feet): 40 Feet Assistive device: Rolling walker (2 wheeled) Gait Pattern/deviations: Step-to pattern;Staggering left;Staggering right Gait velocity: decr   General Gait Details: steady assist initially for balance and turning with RW, multimodal cues for safety and backing up. patient has severe kyphosis and is  looking at the floor.  Stairs            Wheelchair Mobility    Modified Rankin (Stroke Patients Only)       Balance Overall balance assessment: History of Falls;Needs assistance Sitting-balance support: Bilateral upper extremity supported;Feet supported Sitting balance-Leahy Scale: Fair     Standing balance support: Single extremity supported Standing balance-Leahy Scale: Poor Standing balance comment: requires steady assist when dynamic                             Pertinent Vitals/Pain Pain Assessment: No/denies pain    Home Living Family/patient expects to be discharged to:: Private residence Living Arrangements: Alone Available Help at Discharge: Personal care attendant;Friend(s);Available PRN/intermittently Type of Home: House Home Access: Stairs to enter   Entrance Stairs-Number of Steps: 1 Home Layout: One level Home Equipment: Walker - 4 wheels;Walker - 2 wheels;Tub bench Additional Comments: PCA 3 hours /day  x 7    Prior Function Level of Independence: Independent with assistive device(s)         Comments: does not drive,     Hand Dominance        Extremity/Trunk Assessment   Upper Extremity Assessment Upper Extremity Assessment: Defer to OT  evaluation    Lower Extremity Assessment Lower Extremity Assessment: RLE deficits/detail;LLE deficits/detail RLE Deficits / Details: slightly shorter, ankle varus LLE Deficits / Details: ankle varus    Cervical / Trunk Assessment Cervical / Trunk Assessment: Kyphotic(severe)  Communication   Communication: No difficulties  Cognition Arousal/Alertness: Awake/alert Behavior During Therapy: WFL for tasks assessed/performed Overall Cognitive Status: Impaired/Different from baseline Area of Impairment: Safety/judgement;Memory                     Memory: Decreased recall of precautions   Safety/Judgement: Decreased awareness of safety;Decreased awareness of deficits     General Comments: friend came to visit , patient did not have memory of events leading to need  to come tohospital      General Comments      Exercises     Assessment/Plan    PT Assessment Patient needs continued PT services  PT Problem List Decreased strength;Decreased balance;Decreased cognition;Decreased range of motion;Decreased mobility;Decreased knowledge of use of DME;Decreased activity tolerance;Decreased safety awareness       PT Treatment Interventions DME instruction;Functional mobility training;Patient/family education;Gait training;Therapeutic activities;Therapeutic exercise    PT Goals (Current goals can be found in the Care Plan section)  Acute Rehab PT Goals Patient Stated Goal: to go home PT Goal Formulation: With patient Time For Goal Achievement: 02/16/20 Potential to Achieve Goals: Good    Frequency Min 3X/week   Barriers to discharge Decreased caregiver support      Co-evaluation               AM-PAC PT "6 Clicks" Mobility  Outcome Measure Help needed turning from your back to your side while in a flat bed without using bedrails?: A Lot Help needed moving from lying on your back to sitting on the side of a flat bed without using bedrails?: A Lot Help needed  moving to and from a bed to a chair (including a wheelchair)?: A Lot Help needed standing up from a chair using your arms (e.g., wheelchair or bedside chair)?: A Lot Help needed to walk in hospital room?: A Lot Help needed climbing 3-5 steps with a railing? : A Lot 6 Click Score: 12    End of Session Equipment Utilized During Treatment: Gait belt Activity Tolerance: Patient tolerated treatment well Patient left: in chair;with call bell/phone within reach;with chair alarm set;with family/visitor present;with nursing/sitter in room Nurse Communication: Mobility status PT Visit Diagnosis: Unsteadiness on feet (R26.81);Muscle weakness (generalized) (M62.81)    Time: [id number] PT Time Calculation (min) (ACUTE ONLY): 44 min   Charges:   PT Evaluation $PT Eval Moderate Complexity: 1 Mod PT Treatments $Gait Training: 8-22 mins $Self Care/Home Management: Paul Pager [phone] Office [phone]   Claretha Cooper 02/02/2020, 5:43 PM

## 2020-02-02 NOTE — Care Management Important Message (Signed)
Important Message  Patient Details IM Letter given to Gabriel Earing RN Case Manager to present to the Patient Name: Elizabeth [name] MRN: [id number] Date of Birth: 1930-12-12   Medicare Important Message Given:  Yes     Kerin Salen 02/02/2020, 11:32 AM

## 2020-02-02 NOTE — Progress Notes (Addendum)
PROGRESS NOTE   Elizabeth [name]  [id number]    DOB: 1931/05/07    DOA: 01/31/2020  PCP: Binnie Rail, MD   I have briefly reviewed patients previous medical records in [facility].  Chief Complaint:   Chief Complaint  Patient presents with  . Weakness    Brief Narrative:  86 year old female with PMH of CVA, HLD, hypothyroid, Parkinson's disease, intention tremor, post polio syndrome, pernicious anemia, B12 deficiency presented to [facility] ED by EMS on 3/9 due to weakness, fever and confusion.  History of recently being treated with high-dose antibiotics and was on unknown prophylactic UTI antibiotic.  Vital signs stable initially in the ED but then became hypotensive and was bolused with IV fluids.  She was admitted for suspected sepsis due to partially treated UTI.  Continues to gradually improve.   Assessment & Plan:  Principal Problem:   Sepsis secondary to UTI [facility]) Active Problems:   Hypothyroidism   Normocytic anemia   Parkinson disease (HCC)   Malnutrition of moderate degree   Hypokalemia   Hyponatremia   Sepsis secondary to partially treated UTI /Proteus Penneri UTI: Met sepsis criteria on admission.  Blood cultures x2: Negative to date.  Urine culture shows Proteus Penneri: Multidrug-resistant (ampicillin, ampicillin/sulbactam, cefazolin, ceftriaxone, nitrofurantoin and intermediate to imipenem).  Sensitive to Cipro, gentamicin, Zosyn and Bactrim).  Was treated initially with empiric IV Zosyn.  Discussed in detail with pharmacy, please see their note from 3/11.  Patient had been on nitrofurantoin for 7 days from 2/19 and cephalexin 250 mg daily for chronic suppressive therapy.  Her home medication pimavanserin along with quinolone can prolong QTC and hence will avoid using.  As per pharmacy recommendation, will change IV cefepime (which may have provided some coverage) to Bactrim DS 1 tab twice daily x3 days.  Monitor overnight and if remains stable then  likely discharge home tomorrow.  Acute metabolic encephalopathy: Likely secondary to UTI sepsis.  No focal deficits.  Seems to have resolved.  Monitor.  Hypokalemia: Replaced.  Magnesium normal.  Follow BMP in a.m. due to new Bactrim.  Anemia: Suspect chronic disease.  Presented with hemoglobin of 10.5 which is dropped to 9.9 post hydration.  Hemoglobin stable in the 9 range for the last 2 days.  Thrombocytopenia: May be due to acute illness/infectious etiology.  Stable.  Follow CBC in a.m.  Hypothyroid: Continue levothyroxine 88 mcg daily.  Parkinson's disease: Continue prior home medications including carbidopa/levodopa and Nuplazid.  Hyponatremia: Likely related to dehydration.  Dehydration clinically resolved.  Mild and stable.  Body mass index is 14.16 kg/m.   DVT prophylaxis: Lovenox Code Status: Full Family Communication: None at bedside. I was unable to reach patient's Nephew via phone. I called and updated her friend [name], updated care and answered questions. Disposition:  . Patient came from: Home           . Anticipated d/c place: Home . Barriers to d/c: Further clinical improvement, monitor how she does on oral Bactrim prior to likely discharge home 3/12.   Consultants:   None  Procedures:   None  Antimicrobials:   IV cefepime 3/9 > 3/11 Oral Bactrim 3/11 >   Subjective:  Reports that she continues to gradually feel better.  Complains of some stiffness in her left fingers but not right.  Denies any other complaints.  Wants to get out of bed and work with therapies.  No acute issues reported by RN.  Objective:   Vitals:  02/01/20 1623 02/01/20 2053 02/02/20 0512 02/02/20 1333  BP:  (!) 117/49 (!) 142/56 (!) 143/65  Pulse:  70 77 79  Resp:  17 18 18   Temp: 99.3 F (37.4 C) 97.7 F (36.5 C) 99.1 F (37.3 C) 97.9 F (36.6 C)  TempSrc:  Oral Oral Oral  SpO2:  97% 95% 97%  Weight:      Height:        General exam: Pleasant elderly female, small  built and thinly nourished, sitting up comfortably in bed eating lunch by herself. Respiratory system: Clear to auscultation.  No increased work of breathing. Cardiovascular system: S1 and S2 heard, RRR.  No JVD, murmurs or pedal edema.  Telemetry personally reviewed: Sinus rhythm.  No further NSVT is noted. Gastrointestinal system: Abdomen is nondistended, soft and nontender. No organomegaly or masses felt. Normal bowel sounds heard. Central nervous system: Alert and oriented x3. No focal neurological deficits. Extremities: Symmetric 5 x 5 power.  Not much tremor in her upper extremities today.  Significant arthritic changes of small joints of both hands but no acute findings. Skin: No rashes, lesions or ulcers Psychiatry: Judgement and insight appear normal. Mood & affect appropriate.     Data Reviewed:   I have personally reviewed following labs and imaging studies   CBC: Recent Labs  Lab 01/31/20 1126 02/01/20 0452 02/02/20 0307  WBC 8.4 7.2 8.0  NEUTROABS 7.4 6.1 6.4  HGB 10.5* 9.9* 9.7*  HCT 31.5* 30.7* 30.6*  MCV 88.5 88.7 89.5  PLT 146* 142* 147*    Basic Metabolic Panel: Recent Labs  Lab 01/31/20 1126 02/01/20 0452 02/02/20 0307  NA 132* 137 133*  K 3.3* 3.2* 4.5  CL 102 108 105  CO2 23 21* 20*  GLUCOSE 126* 101* 109*  BUN 20 19 18   CREATININE 0.82 0.74 0.68  CALCIUM 8.2* 8.2* 8.2*  MG  --  1.8  --     Liver Function Tests: Recent Labs  Lab 01/31/20 1126 02/01/20 0452  AST 26 24  ALT 9 17  ALKPHOS 62 55  BILITOT 1.7* 1.3*  PROT 5.5* 5.1*  ALBUMIN 3.0* 2.8*    CBG: No results for input(s): GLUCAP in the last 168 hours.  Microbiology Studies:   Recent Results (from the past 240 hour(s))  Blood Culture (routine x 2)     Status: None (Preliminary result)   Collection Time: 01/31/20 11:08 AM   Specimen: BLOOD  Result Value Ref Range Status   Specimen Description   Final    BLOOD LEFT ANTECUBITAL Performed at [facility] [address] [place], [place] [address]    Special Requests   Final    BOTTLES DRAWN AEROBIC AND ANAEROBIC Blood Culture adequate volume Performed at [facility] [address] [place], [place] [address]    Culture   Final    NO GROWTH 2 DAYS Performed at [facility] [address], [place], [place] [address]    Report Status PENDING  Incomplete  Urine culture     Status: Abnormal   Collection Time: 01/31/20 11:26 AM   Specimen: In/Out Cath Urine  Result Value Ref Range Status   Specimen Description   Final    IN/OUT CATH URINE Performed at [facility] [address], [place], [place] [address]    Special Requests   Final    NONE Performed at [facility], [place] [address] [place], [place] [address]  Culture >=100,000 COLONIES/mL PROTEUS PENNERI (A)  Final   Report Status 02/02/2020 FINAL  Final   Organism ID, Bacteria PROTEUS PENNERI (A)  Final      Susceptibility   Proteus penneri - MIC*    AMPICILLIN >=32 RESISTANT Resistant     CEFAZOLIN >=64 RESISTANT Resistant     CEFTRIAXONE 16 RESISTANT Resistant     CIPROFLOXACIN <=0.25 SENSITIVE Sensitive     GENTAMICIN <=1 SENSITIVE Sensitive     IMIPENEM 8 INTERMEDIATE Intermediate     NITROFURANTOIN 128 RESISTANT Resistant     TRIMETH/SULFA <=20 SENSITIVE Sensitive     AMPICILLIN/SULBACTAM >=32 RESISTANT Resistant     PIP/TAZO <=4 SENSITIVE Sensitive     * >=100,000 COLONIES/mL PROTEUS PENNERI  Blood Culture (routine x 2)     Status: None (Preliminary result)   Collection Time: 01/31/20  3:19 PM   Specimen: BLOOD  Result Value Ref Range Status   Specimen Description   Final    BLOOD LEFT ANTECUBITAL Performed at [facility], [place] [address] [place], [place] [address]    Special Requests   Final    BOTTLES DRAWN AEROBIC AND ANAEROBIC Blood Culture adequate volume Performed at [facility] [address],  [place], [place] [address]    Culture   Final    NO GROWTH 2 DAYS Performed at [facility] [address] [place] [address], [place], [place] [address]    Report Status PENDING  Incomplete     Radiology Studies:  No results found.   Scheduled Meds:   . Carbidopa-Levodopa ER  1 capsule Oral Q24H  . Carbidopa-Levodopa ER  2 capsule Oral 2 times per day  . cholecalciferol  5,000 Units Oral Daily  . enoxaparin (LOVENOX) injection  20 mg Subcutaneous Q24H  . levothyroxine  88 mcg Oral QAC breakfast  . Pimavanserin Tartrate  34 mg Oral Daily  . sertraline  25 mg Oral QHS    Continuous Infusions:   . ceFEPime (MAXIPIME) IV Stopped (02/01/20 1827)     LOS: 2 days     Vernell Leep, MD, [place], [facility]. [facility]    To contact the attending provider between 7A-7P or the covering provider during after hours 7P-7A, please log into the web site [email or web address] and access using universal [place] password for that web site. If you do not have the password, please call the hospital operator.  02/02/2020, 3:32 PM

## 2020-02-02 NOTE — Progress Notes (Addendum)
Antimicrobial stewardship - Brief Note  Patient with Proteus Penneri in Urine cx - this indole positive Proteus species is typically more resistant as seen in this patient.  Patient has received 2 days of antibiotics (ceftriaxone to cefepime)  Per outpatient prescribing records, she was prescribed nitrofurantoin starting 2/19 for 7 days.  She also appears to be on cephalexin 250mg  daily for chronic suppressive therapy   Note: Pimavanserin and quinolones can both prolong QTc so use caution with that combination if chosen.   The other oral option appears to be trimethoprim/sulfamethoxazole x 3 days, considering low total body weight - single strength or double strength likely appropriate (SS BID -  ~5mg /kg/day TMP and DS BID - 9mg /kg/day TMP)    Doreene Eland, PharmD, BCPS.   Work Cell: [phone] 02/02/2020 11:25 AM

## 2020-02-02 NOTE — Progress Notes (Signed)
PHARMACY NOTE:  ANTIMICROBIAL RENAL DOSAGE ADJUSTMENT  Current antimicrobial regimen includes a mismatch between antimicrobial dosage and estimated renal function.  As per policy approved by the Pharmacy & Therapeutics and Medical Executive Committees, the antimicrobial dosage will be adjusted accordingly.  Current antimicrobial dosage:  Bactrim DS po BID   Renal Function:  Estimated Creatinine Clearance: 27.1 mL/min (by C-G formula based on SCr of 0.68 mg/dL). []      On intermittent HD, scheduled: []      On CRRT    Antimicrobial dosage has been changed to:  Single strength bactrim po BID  Additional comments:   Thank you for allowing pharmacy to be a part of this patient's care.  Dolly Rias RPh 02/02/2020, 3:49 PM

## 2020-02-03 ENCOUNTER — Telehealth: Payer: Self-pay | Admitting: Neurology

## 2020-02-03 ENCOUNTER — Other Ambulatory Visit: Payer: Self-pay | Admitting: Neurology

## 2020-02-03 LAB — CBC WITH DIFFERENTIAL/PLATELET
Abs Immature Granulocytes: 0.08 10*3/uL — ABNORMAL HIGH (ref 0.00–0.07)
Basophils Absolute: 0 10*3/uL (ref 0.0–0.1)
Basophils Relative: 1 %
Eosinophils Absolute: 0.2 10*3/uL (ref 0.0–0.5)
Eosinophils Relative: 3 %
HCT: 30.7 % — ABNORMAL LOW (ref 36.0–46.0)
Hemoglobin: 10 g/dL — ABNORMAL LOW (ref 12.0–15.0)
Immature Granulocytes: 1 %
Lymphocytes Relative: 11 %
Lymphs Abs: 0.7 10*3/uL (ref 0.7–4.0)
MCH: 28.8 pg (ref 26.0–34.0)
MCHC: 32.6 g/dL (ref 30.0–36.0)
MCV: 88.5 fL (ref 80.0–100.0)
Monocytes Absolute: 0.6 10*3/uL (ref 0.1–1.0)
Monocytes Relative: 10 %
Neutro Abs: 5 10*3/uL (ref 1.7–7.7)
Neutrophils Relative %: 74 %
Platelets: 157 10*3/uL (ref 150–400)
RBC: 3.47 MIL/uL — ABNORMAL LOW (ref 3.87–5.11)
RDW: 14.6 % (ref 11.5–15.5)
WBC: 6.6 10*3/uL (ref 4.0–10.5)
nRBC: 0 % (ref 0.0–0.2)

## 2020-02-03 LAB — BASIC METABOLIC PANEL
Anion gap: 7 (ref 5–15)
BUN: 13 mg/dL (ref 8–23)
CO2: 23 mmol/L (ref 22–32)
Calcium: 8.1 mg/dL — ABNORMAL LOW (ref 8.9–10.3)
Chloride: 102 mmol/L (ref 98–111)
Creatinine, Ser: 0.57 mg/dL (ref 0.44–1.00)
GFR calc Af Amer: >60 mL/min (ref >60)
GFR calc non Af Amer: >60 mL/min (ref >60)
Glucose, Bld: 105 mg/dL — ABNORMAL HIGH (ref 70–99)
Potassium: 4 mmol/L (ref 3.5–5.1)
Sodium: 132 mmol/L — ABNORMAL LOW (ref 135–145)

## 2020-02-03 MED ORDER — SULFAMETHOXAZOLE-TRIMETHOPRIM 400-80 MG PO TABS: 1.0000 | ORAL_TABLET | Freq: Two times a day (BID) | ORAL | 0 refills | Status: DC

## 2020-02-03 NOTE — TOC Progression Note (Signed)
Transition of Care [facility]) - Progression Note    Patient Details  Name: Elizabeth [name] MRN: [id number] Date of Birth: [birth year]  [facility] CM/SW Contact  Purcell Mouton, RN Phone Number: 02/03/2020, 1:18 PM  Clinical Narrative:    [place] at home with follow pt at home. Start up care Thurs 02/09/20.        Expected Discharge Plan and Services           Expected Discharge Date: 02/03/20                                     Social Determinants of Health (SDOH) Interventions    Readmission Risk Interventions No flowsheet data found.

## 2020-02-03 NOTE — Discharge Summary (Signed)
Physician Discharge Summary  Hargun Spurling Garman YBO:[id number] DOB: [birth year]  PCP: Binnie Rail, MD  Admitted from: Home Discharged to: Home  Admit date: 01/31/2020 Discharge date: 02/03/2020  Recommendations for Outpatient Follow-up:   Follow-up Information    Binnie Rail, MD. Schedule an appointment as soon as possible for a visit in 1 week(s).   Specialty: Internal Medicine Why: To be seen with repeat labs (CBC & BMP). Contact information: [place] [address] [phone]            Home Health: PT and OT Equipment/Devices: 3 n 1  Discharge Condition: Improved and stable CODE STATUS: Full Diet recommendation: Heart healthy diet.  Discharge Diagnoses:  Principal Problem:   Sepsis secondary to UTI [facility]) Active Problems:   Hypothyroidism   Normocytic anemia   Parkinson disease (HCC)   Malnutrition of moderate degree   Hypokalemia   Hyponatremia   Brief Summary: 86 year old female with PMH of CVA, HLD, hypothyroid, Parkinson's disease, intention tremor, post polio syndrome, pernicious anemia, B12 deficiency presented to [facility] ED by EMS on 3/9 due to weakness, fever and confusion.  History of recently being treated antibiotics for UTI.  Vital signs stable initially in the ED but then became hypotensive and was bolused with IV fluids.  She was admitted for suspected sepsis due to partially treated UTI.    Assessment & Plan:   Sepsis secondary to partially treated UTI /Proteus Penneri UTI: Met sepsis criteria on admission.  Blood cultures x2: Negative to date.  Urine culture shows Proteus Penneri: Multidrug-resistant (ampicillin, ampicillin/sulbactam, cefazolin, ceftriaxone, nitrofurantoin and intermediate to imipenem).  Sensitive to Cipro, gentamicin, Zosyn and Bactrim).  Was treated initially with empiric IV Zosyn.  Discussed in detail with pharmacy, please see their note from 3/11.  Patient had been on nitrofurantoin for 7 days  from 2/19 followed by cephalexin 250 mg daily for chronic suppressive therapy.  Her home medication pimavanserin along with quinolone can prolong QTC and hence avoided using quinolones.  As per pharmacy recommendation, changed IV cefepime (which may have provided some coverage) to Bactrim DS 1 tab twice daily x3 days.    Patient tolerated Bactrim without complaints.  Labs are stable.  She has completed 1 day of Bactrim and will complete an additional 2 days to complete course.  No fever since 3/9.  Discontinued Keflex prophylaxis that she was on PTA which is only likely to worsen her multidrug-resistant situation.  Close outpatient follow-up with PCP.  Consider repeating urine microscopy in a couple of weeks.  May also consider Urology consultation if deemed necessary for possible mechanical issues for recurrent UTI.  Acute metabolic encephalopathy: Likely secondary to UTI sepsis.  No focal deficits.    Resolved.  Hypokalemia: Replaced.  Magnesium normal.   Anemia: Suspect chronic disease.  Presented with hemoglobin of 10.5 which is dropped to 9.9 post hydration.  Hemoglobin stable in the 9 range for the last 2 days.  Outpatient follow-up.  Thrombocytopenia: May be due to acute illness/infectious etiology.   Resolved today.  Hypothyroid: Continue levothyroxine 88 mcg daily.  Parkinson's disease: Continue prior home medications including carbidopa/levodopa and Nuplazid.  Hyponatremia: Likely related to dehydration.  Dehydration clinically resolved.  Mild and stable.  Body mass index is 14.16 kg/m.  Advanced age, frail physical status and gait instability: PT and OT evaluated.  As per their recommendations, patient and her friend were able to arrange for 24/7 supervision and care at home along with home health  therapies as above and DME.   Consultants:   None  Procedures:   None   Discharge Instructions  Discharge Instructions    Call MD for:   Complete by: As directed     Recurrent confusion or altered mental status.   Call MD for:  difficulty breathing, headache or visual disturbances   Complete by: As directed    Call MD for:  extreme fatigue   Complete by: As directed    Call MD for:  persistant dizziness or light-headedness   Complete by: As directed    Call MD for:  persistant nausea and vomiting   Complete by: As directed    Call MD for:  severe uncontrolled pain   Complete by: As directed    Call MD for:  temperature >100.4   Complete by: As directed    Diet - low sodium heart healthy   Complete by: As directed    Increase activity slowly   Complete by: As directed        Medication List    STOP taking these medications   cephALEXin 250 MG capsule Commonly known as: KEFLEX     TAKE these medications   levothyroxine 88 MCG tablet Commonly known as: SYNTHROID TAKE 1 TABLET BY MOUTH DAILY. What changed: when to take this   Nuplazid 34 MG Caps Generic drug: Pimavanserin Tartrate Take 1 capsule (34 mg total) by mouth daily.   Rytary 36.25-145 MG Cpcr Generic drug: Carbidopa-Levodopa ER TAKE 2 TABLETS EACH A.M., 2 IN THE AFTERNOON AND 1 TABLET EACH EVENING. What changed: See the new instructions.   sertraline 25 MG tablet Commonly known as: Zoloft Take 1 tablet (25 mg total) by mouth at bedtime.   sulfamethoxazole-trimethoprim 400-80 MG tablet Commonly known as: BACTRIM Take 1 tablet by mouth 2 (two) times daily for 2 days.   vitamin B-12 1000 MCG tablet Commonly known as: CYANOCOBALAMIN Take 1 tablet (1,000 mcg total) by mouth daily.   Vitamin D 125 MCG (5000 UT) Caps Take 1 capsule by mouth daily.      Allergies  Allergen Reactions  . Atorvastatin Other (See Comments)    REACTION: Felt funny (only way patient could describe)  . Crestor [Rosuvastatin Calcium] Other (See Comments)    Leg cramps, muscle aches  . Vesicare [Solifenacin]     constipation      Procedures/Studies: CT Head Wo Contrast  Result Date:  01/31/2020 CLINICAL DATA:  Mental status change. Fever and confusion. Progressive weakness. EXAM: CT HEAD WITHOUT CONTRAST TECHNIQUE: Contiguous axial images were obtained from the base of the skull through the vertex without intravenous contrast. COMPARISON:  Head CT 12/16/2018 FINDINGS: Brain: No evidence of acute infarction, hemorrhage, hydrocephalus, extra-axial collection or mass lesion/mass effect. Stable degree of atrophy. Advanced periventricular and deep white matter hypodensity consistent with chronic small vessel ischemia, unchanged. Remote lacunar infarcts in the bilateral caudate and left basal ganglia. Remote right occipital infarct. Vascular: Atherosclerosis of skullbase vasculature without hyperdense vessel or abnormal calcification. Skull: No fracture or focal lesion. Sinuses/Orbits: Paranasal sinuses and mastoid air cells are clear. The visualized orbits are unremarkable. Bilateral cataract resection. Other: None. IMPRESSION: 1. No acute intracranial abnormality. 2. Stable atrophy, chronic vessel ischemia and remote infarcts. Electronically Signed   By: Keith Rake M.D.   On: 01/31/2020 15:44   DG Chest Port 1 View  Result Date: 01/31/2020 CLINICAL DATA:  Weakness, confusion EXAM: PORTABLE CHEST 1 VIEW COMPARISON:  12/16/2018 FINDINGS: The heart size and mediastinal contours are  stable. No focal airspace consolidation, pleural effusion, or pneumothorax. Diffuse bony demineralization. No acute osseous findings. IMPRESSION: No active cardiopulmonary disease. Electronically Signed   By: Davina Poke D.O.   On: 01/31/2020 12:18      Subjective: "I am fine".  Denies complaints.  Denies fever, chills, dysuria, urinary frequency, pain issues.  As per RN, no acute issues noted.  Discharge Exam:  Vitals:   02/02/20 0512 02/02/20 1333 02/02/20 2048 02/03/20 0543  BP: (!) 142/56 (!) 143/65 (!) 135/59 138/65  Pulse: 77 79 69 66  Resp: _0 Temp: 99.1 F (37.3 C) 97.9 F (36.6  C) 97.8 F (36.6 C) 97.6 F (36.4 C)  TempSrc: Oral Oral Oral Oral  SpO2: 95% 97% 97% 96%  Weight:      Height:       General exam: Pleasant elderly female, small built and thinly nourished, sitting up comfortably in bed without distress Respiratory system: Clear to auscultation.  No increased work of breathing. Cardiovascular system: S1 and S2 heard, RRR.  No JVD, murmurs or pedal edema.  Gastrointestinal system: Abdomen is nondistended, soft and nontender. No organomegaly or masses felt. Normal bowel sounds heard. Central nervous system: Alert and oriented x3. No focal neurological deficits. Extremities: Symmetric 5 x 5 power.  Not much tremor in her upper extremities today.  Significant arthritic changes of small joints of both hands but no acute findings. Skin: No rashes, lesions or ulcers Psychiatry: Judgement and insight appear normal. Mood & affect appropriate.    The results of significant diagnostics from this hospitalization (including imaging, microbiology, ancillary and laboratory) are listed below for reference.     Microbiology: Recent Results (from the past 240 hour(s))  Blood Culture (routine x 2)     Status: None (Preliminary result)   Collection Time: 01/31/20 11:08 AM   Specimen: BLOOD  Result Value Ref Range Status   Specimen Description   Final    BLOOD LEFT ANTECUBITAL Performed at [facility] [address], [place], [place] [address]    Special Requests   Final    BOTTLES DRAWN AEROBIC AND ANAEROBIC Blood Culture adequate volume Performed at [facility] [address], [place], [place] [address]    Culture   Final    NO GROWTH 3 DAYS Performed at [facility], [place] [address] [place], [place] [address]    Report Status PENDING  Incomplete  Urine culture     Status: Abnormal   Collection Time: 01/31/20 11:26 AM   Specimen: In/Out Cath Urine  Result Value Ref Range Status   Specimen Description   Final     IN/OUT CATH URINE Performed at [facility] [address], [place], [place] [address]    Special Requests   Final    NONE Performed at [facility], [place] [address], [place],  [address]    Culture >=100,000 COLONIES/mL PROTEUS PENNERI (A)  Final   Report Status 02/02/2020 FINAL  Final   Organism ID, Bacteria PROTEUS PENNERI (A)  Final      Susceptibility   Proteus penneri - MIC*    AMPICILLIN >=32 RESISTANT Resistant     CEFAZOLIN >=64 RESISTANT Resistant     CEFTRIAXONE 16 RESISTANT Resistant     CIPROFLOXACIN <=0.25 SENSITIVE Sensitive     GENTAMICIN <=1 SENSITIVE Sensitive     IMIPENEM 8 INTERMEDIATE Intermediate     NITROFURANTOIN 128 RESISTANT Resistant  TRIMETH/SULFA <=20 SENSITIVE Sensitive     AMPICILLIN/SULBACTAM >=32 RESISTANT Resistant     PIP/TAZO <=4 SENSITIVE Sensitive     * >=100,000 COLONIES/mL PROTEUS PENNERI  Blood Culture (routine x 2)     Status: None (Preliminary result)   Collection Time: 01/31/20  3:19 PM   Specimen: BLOOD  Result Value Ref Range Status   Specimen Description   Final    BLOOD LEFT ANTECUBITAL Performed at [facility], [place] [address], [place], [place] [address]    Special Requests   Final    BOTTLES DRAWN AEROBIC AND ANAEROBIC Blood Culture adequate volume Performed at [facility] [address] [place], [place] [address]    Culture   Final    NO GROWTH 3 DAYS Performed at [facility], [place] [address], [place], [place] [address]    Report Status PENDING  Incomplete     Labs: CBC: Recent Labs  Lab 01/31/20 1126 02/01/20 0452 02/02/20 0307 02/03/20 0313  WBC 8.4 7.2 8.0 6.6  NEUTROABS 7.4 6.1 6.4 5.0  HGB 10.5* 9.9* 9.7* 10.0*  HCT 31.5* 30.7* 30.6* 30.7*  MCV 88.5 88.7 89.5 88.5  PLT 146* 142* 147* 481    Basic Metabolic Panel: Recent Labs  Lab 01/31/20 1126 02/01/20 0452 02/02/20 0307 02/03/20 0313  NA 132* 137  133* 132*  K 3.3* 3.2* 4.5 4.0  CL 102 108 105 102  CO2 23 21* 20* 23  GLUCOSE 126* 101* 109* 105*  BUN _0 CREATININE 0.82 0.74 0.68 0.57  CALCIUM 8.2* 8.2* 8.2* 8.1*  MG  --  1.8  --   --     Liver Function Tests: Recent Labs  Lab 01/31/20 1126 02/01/20 0452  AST 26 24  ALT 9 17  ALKPHOS 62 55  BILITOT 1.7* 1.3*  PROT 5.5* 5.1*  ALBUMIN 3.0* 2.8*     Urinalysis    Component Value Date/Time   COLORURINE AMBER (A) 01/31/2020 1126   APPEARANCEUR CLOUDY (A) 01/31/2020 1126   LABSPEC 1.014 01/31/2020 1126   PHURINE 9.0 (H) 01/31/2020 1126   GLUCOSEU NEGATIVE 01/31/2020 1126   GLUCOSEU NEGATIVE 11/15/2014 1537   HGBUR SMALL (A) 01/31/2020 1126   BILIRUBINUR NEGATIVE 01/31/2020 1126   BILIRUBINUR negative 10/12/2018 1115   KETONESUR 5 (A) 01/31/2020 1126   PROTEINUR 100 (A) 01/31/2020 1126   UROBILINOGEN negative (A) 10/12/2018 1115   UROBILINOGEN 0.2 11/15/2014 1537   NITRITE POSITIVE (A) 01/31/2020 1126   LEUKOCYTESUR LARGE (A) 01/31/2020 1126   I attempted to reach patient's friend Ms. Rushie Nyhan but was unable and hence left VM message.  I had already discussed with her in detail on 3/11 and had updated her regarding patient's care including discharge plans for today.  Time coordinating discharge: 35 minutes  SIGNED:  Vernell Leep, MD, [place], [facility]. [facility]  To contact the attending provider between 7A-7P or the covering provider during after hours 7P-7A, please log into the web site [email or web address] and access using universal [place] password for that web site. If you do not have the password, please call the hospital operator.

## 2020-02-03 NOTE — Discharge Instructions (Signed)

## 2020-02-03 NOTE — TOC Progression Note (Signed)
Transition of Care [facility]) - Progression Note    Patient Details  Name: Elizabeth [name] MRN: [id number] Date of Birth: June 14, 1931  Transition of Care [facility]) CM/SW Contact  Purcell Mouton, RN Phone Number: 02/03/2020, 11:35 AM  Clinical Narrative:    Pt will discharge with [facility] for 24/7 care sitters.        Expected Discharge Plan and Services                                                 Social Determinants of Health (SDOH) Interventions    Readmission Risk Interventions No flowsheet data found.

## 2020-02-03 NOTE — Evaluation (Signed)
Occupational Therapy Evaluation Patient Details Name: Elizabeth [name] MRN: [id number] DOB: 03/13/1931 Today's Date: 02/03/2020    History of Present Illness 86 year-old female with PMH of CVA, HLD, hypothyroid, Parkinson's disease, intention tremor, post polio syndrome, pernicious anemia, B12 deficiency presented to [facility] ED by EMS on 3/9 due to weakness, fever and confusion.  History of recently being treated for UTI.  Vital signs stable initially in the ED but then became hypotensive .  She was admitted for suspected sepsis due to partially treated UTI   Clinical Impression   Pt was admitted for the above.  At baseline, she is mod I for adls and has a PCA from [facility] for IADLs 3 hours a day.  [facility] came at the end of my session; they will be providing 24/7. Will follow in acute setting with min guard level goals.  Pt did demonstrate decreased memory during session; some of this may be due to unfamiliar environment.    Follow Up Recommendations  Home health OT;Supervision/Assistance - 24 hour    Equipment Recommendations  3 in 1 bedside commode    Recommendations for Other Services       Precautions / Restrictions Precautions Precautions: Fall Restrictions Weight Bearing Restrictions: No      Mobility Bed Mobility         Supine to sit: Min assist     General bed mobility comments: to scoot to EOB, from [facility] raised  Transfers                 General transfer comment: did not perform today with OT; mod A yesterday with PT    Balance                                           ADL either performed or assessed with clinical judgement   ADL Overall ADL's : Needs assistance/impaired Eating/Feeding: Set up   Grooming: Oral care;Minimal assistance   Upper Body Bathing: Set up   Lower Body Bathing: Moderate assistance   Upper Body Dressing : Minimal assistance   Lower Body Dressing: Moderate  assistance                 General ADL Comments: sat EOB for an extended time to brush teeth. Pt fatiques quickly.  Confused about phone/call bell.  Changed gown after oral care due to spillage     Vision         Perception     Praxis      Pertinent Vitals/Pain Pain Assessment: No/denies pain     Hand Dominance     Extremity/Trunk Assessment Upper Extremity Assessment Upper Extremity Assessment: Generalized weakness(LUE limited ROM to approximately 70)           Communication Communication Communication: No difficulties   Cognition Arousal/Alertness: Awake/alert Behavior During Therapy: WFL for tasks assessed/performed Overall Cognitive Status: Impaired/Different from baseline                       Memory: Decreased short-term memory   Safety/Judgement: Decreased awareness of safety;Decreased awareness of deficits         General Comments       Exercises     Shoulder Instructions      Home Living Family/patient expects to be discharged to:: Private residence Living Arrangements: Alone  Bathroom Shower/Tub: Teacher, early years/pre: Handicapped height     Home Equipment: Environmental consultant - 4 wheels;Walker - 2 wheels;Tub bench   Additional Comments: will have [facility] 24/7      Prior Functioning/Environment Level of Independence: Independent with assistive device(s)        Comments: does not drive,  Had PCA who did IADLs PTA        OT Problem List: Decreased strength;Decreased activity tolerance;Impaired balance (sitting and/or standing);Decreased safety awareness;Decreased cognition;Decreased knowledge of use of DME or AE      OT Treatment/Interventions: Self-care/ADL training;Energy conservation;DME and/or AE instruction;Therapeutic activities;Cognitive remediation/compensation;Balance training;Patient/family education    OT Goals(Current goals can be found in the care plan section) Acute Rehab  OT Goals Patient Stated Goal: to go home OT Goal Formulation: With patient Time For Goal Achievement: 02/17/20 Potential to Achieve Goals: Good ADL Goals Pt Will Transfer to Toilet: with min guard assist;ambulating;bedside commode Pt Will Perform Toileting - Clothing Manipulation and hygiene: with min guard assist;sit to/from stand;sitting/lateral leans Additional ADL Goal #1: pt will perform adl with min guard and set up  OT Frequency: Min 2X/week   Barriers to D/C:            Co-evaluation              AM-PAC OT "6 Clicks" Daily Activity     Outcome Measure Help from another person eating meals?: A Little Help from another person taking care of personal grooming?: A Little Help from another person toileting, which includes using toliet, bedpan, or urinal?: A Lot Help from another person bathing (including washing, rinsing, drying)?: A Lot Help from another person to put on and taking off regular upper body clothing?: A Little Help from another person to put on and taking off regular lower body clothing?: A Lot 6 Click Score: 15   End of Session    Activity Tolerance: Patient tolerated treatment well Patient left: in bed;with call bell/phone within reach;with bed alarm set  OT Visit Diagnosis: Muscle weakness (generalized) (M62.81)                Time: [id number] OT Time Calculation (min): 24 min Charges:  OT General Charges $OT Visit: 1 Visit OT Evaluation $OT Eval Moderate Complexity: 1 Mod OT Treatments $Self Care/Home Management : 8-22 mins  Elizabeth [name] S, OTR/L Acute Rehabilitation Services 02/03/2020  [place] 02/03/2020, 12:05 PM

## 2020-02-03 NOTE — Progress Notes (Signed)
Physical Therapy Treatment Patient Details Name: Elizabeth [name] MRN: [id number] DOB: Jan 26, 1931 Today's Date: 02/03/2020    History of Present Illness 86 year-old female with PMH of CVA, HLD, hypothyroid, Parkinson's disease, intention tremor, post polio syndrome, pernicious anemia, B12 deficiency presented to [facility] ED by EMS on 3/9 due to weakness, fever and confusion.  History of recently being treated for UTI.  Vital signs stable initially in the ED but then became hypotensive .  She was admitted for suspected sepsis due to partially treated UTI    PT Comments    Pt in bed found to be incont of urine.  Pt stated she wears briefs at home.  Required max encouragement to get OOB to bathroom and get cleaned up.  Pt fixated on having breakfast first but has yet to arrive.  Did assist her OOB to amb to bathroom.   General bed mobility comments: assist o scoot to EOB and required increased time.  "I need my shoes"..."I don't walk barefoot".  Assisted with applying slippers.  General transfer comment: pt able to self rise but present with kyphosis.  Also assisted with toilet transfer. General Gait Details: slow but steady gait to and from bathroom.  Poor forward flex Kyphotic posture.  Short, shuffled steps. Assisted back to bed per pt request then breakfast arrived.  "good my coffee is here".   "take my shoes off".   Follow Up Recommendations  SNF(per chart review, pt plans to D/C back home with increased home help.)     Equipment Recommendations  None recommended by PT    Recommendations for Other Services       Precautions / Restrictions Precautions Precautions: Fall Precaution Comments: Kyphotic Restrictions Weight Bearing Restrictions: No    Mobility  Bed Mobility Overal bed mobility: Needs Assistance Bed Mobility: Supine to Sit;Sit to Supine     Supine to sit: Supervision;Min guard Sit to supine: Min guard   General bed mobility comments: assist o scoot to EOB and  required increased time.  "I need my shoes"..."I don't walk barefoot".  Assisted with applying slippers  Transfers Overall transfer level: Needs assistance Equipment used: Rolling walker (2 wheeled) Transfers: Sit to/from [facility] Sit to Stand: Supervision Stand pivot transfers: Supervision;Min guard       General transfer comment: pt able to self rise but present with kyphosis.  Also assisted with toilet transfer.  Ambulation/Gait Ambulation/Gait assistance: Supervision;Min guard Gait Distance (Feet): 22 Feet(to and from bathroom was all she would agree to do) Assistive device: Rolling walker (2 wheeled)(youth) Gait Pattern/deviations: Step-to pattern;Staggering left;Staggering right Gait velocity: decr   General Gait Details: slow but steady gait to and from bathroom.  Poor forward flex Kyphotic posture.  Short, shuffled steps.   Stairs             Wheelchair Mobility    Modified Rankin (Stroke Patients Only)       Balance                                            Cognition Arousal/Alertness: Awake/alert Behavior During Therapy: WFL for tasks assessed/performed Overall Cognitive Status: Within Functional Limits for tasks assessed                       Memory: Decreased short-term memory   Safety/Judgement: Decreased awareness of safety;Decreased awareness of  deficits     General Comments: AxO x 4      Exercises      General Comments        Pertinent Vitals/Pain Pain Assessment: No/denies pain    Home Living Family/patient expects to be discharged to:: Private residence Living Arrangements: Alone           Home Equipment: Walker - 4 wheels;Walker - 2 wheels;Tub bench Additional Comments: will have comfort keepers 24/7    Prior Function Level of Independence: Independent with assistive device(s)      Comments: does not drive,  Had PCA who did IADLs PTA   PT Goals (current goals can now be  found in the care plan section) Acute Rehab PT Goals Patient Stated Goal: to go home Progress towards PT goals: Progressing toward goals    Frequency    Min 3X/week      PT Plan Current plan remains appropriate    Co-evaluation              AM-PAC PT "6 Clicks" Mobility   Outcome Measure  Help needed turning from your back to your side while in a flat bed without using bedrails?: A Little Help needed moving from lying on your back to sitting on the side of a flat bed without using bedrails?: A Little Help needed moving to and from a bed to a chair (including a wheelchair)?: A Little Help needed standing up from a chair using your arms (e.g., wheelchair or bedside chair)?: A Little Help needed to walk in hospital room?: A Little Help needed climbing 3-5 steps with a railing? : A Little 6 Click Score: 18    End of Session Equipment Utilized During Treatment: Gait belt Activity Tolerance: Patient tolerated treatment well Patient left: in bed;with call bell/phone within reach Nurse Communication: Mobility status PT Visit Diagnosis: Unsteadiness on feet (R26.81);Muscle weakness (generalized) (M62.81)     Time: [id number] PT Time Calculation (min) (ACUTE ONLY): 25 min  Charges:  $Gait Training: 8-22 mins $Therapeutic Activity: 8-22 mins                     Rica Koyanagi  PTA Acute  Rehabilitation Services Pager      [phone] Office      [phone]

## 2020-02-03 NOTE — Telephone Encounter (Signed)
Patient was admitted to the hospital due to sepsis from a urinary infection she needs to talk to someone about the nuplazid

## 2020-02-03 NOTE — Telephone Encounter (Signed)
Spoke with patients friend [name] and patient is going to continue medication for now and see what cost is at [facility].

## 2020-02-05 LAB — CULTURE, BLOOD (ROUTINE X 2)
Culture: NO GROWTH
Culture: NO GROWTH
Special Requests: ADEQUATE
Special Requests: ADEQUATE

## 2020-02-10 ENCOUNTER — Telehealth: Payer: Self-pay

## 2020-02-10 NOTE — Telephone Encounter (Signed)
Okay to give verbal? 

## 2020-02-10 NOTE — Telephone Encounter (Signed)
New message    [facility] calling needs verbal orders for PT eval on Monday 3.22.2021    The patient refuse Physical therapy today

## 2020-02-13 ENCOUNTER — Inpatient Hospital Stay: Payer: Medicare Other | Admitting: Internal Medicine

## 2020-02-13 NOTE — Patient Instructions (Addendum)
  Blood work/urine tests ordered.     Medications reviewed and updated.  Changes include :   none     Please followup in 6 months

## 2020-02-13 NOTE — Assessment & Plan Note (Addendum)
Has 24/7 care now Doing PT Following with Dr Carles Collet

## 2020-02-13 NOTE — Progress Notes (Signed)
Subjective:    Patient ID: Elizabeth [name], female    DOB: [birth year], 86 y.o.   MRN: [id number]  HPI The patient is here for follow up from the hospital.  Her friend [name] is here with her.   Admitted 01/31/20 - 02/03/20 for sepsis due to UTI  She presented to the ED for weakness, fever, and confusion.  VS were initially stable and she then became hypotensive and had IVF.  She was started on antibiotics.     Sepsis secondary to partially treated UTI/proteus penneri UTI: Met sepsis criteria Blood cx x 2 neg ucx - proteus penneri - multidrug resistant Treated with zosyn, the cefepime and d/c''d on bactreim ds x 3 days Labs stable D/c'd keflex prophylaxis Can repeat urine  Consider urology c/s  Acute metabolic encephalopathy: Likely secondary to UTI sepsis No focal deficits Resolved  Hypokalemia: Replaced, mg normal  Anemia: Suspect chronic disease hgb 10.5 - dropped to 9.9 post hydration then stable Follow up  Thrombocytopenia: May be due to acute illness/infectious etiology Resolved  Hypothyroid: Levothyroxine 88 mcg daily  Parkinson's disease: Stable Continued on home meds  Hyponatremia: Likely related to dehydration Resolved  Advanced age, frail, gait instability: PT, OT evaluation Has 24/7 supervision and care at home Home PT, OT  She is getting stronger.   She is doing PT at home now.  She has had one fall recently, but was able to get up on her own.  She still has 24/7 care, but does not think she needs that much.  Once in a while - feels anxious - mostly when she does not feel well.  She is taking sertraline daily - only 1/2 of a pill.  She does not want to take more.   Medications and allergies reviewed with patient and updated if appropriate.  Patient Active Problem List   Diagnosis Date Noted  . Sepsis secondary to UTI ([place]) 01/31/2020  . Hypokalemia 01/31/2020  . Hyponatremia 01/31/2020  . Constipation 01/22/2019  . Malnutrition of moderate  degree 12/17/2018  . Pars defect of lumbar spine 12/16/2018  . Compression fracture of L3 vertebra ([place]) 11/02/2018  . Right shoulder pain 11/02/2018  . Left groin pain 10/19/2018  . Low back pain 10/19/2018  . Dysuria 10/12/2018  . Osteoporosis 05/30/2018  . Poor posture 04/28/2018  . Physical deconditioning 04/28/2018  . Anxiety 03/02/2018  . Foot pain, bilateral 12/21/2017  . Bilateral leg edema 12/02/2017  . Overactive bladder 09/19/2017  . Post-polio syndrome   . Vitamin B12 deficiency   . Fall 09/11/2017  . Subdural hygroma 09/11/2017  . Swallowing disorder 03/04/2017  . Parkinson disease ([place]) 06/25/2016  . Restless leg syndrome 12/25/2015  . Arthritis 12/25/2015  . Abnormality of gait 05/24/2015  . History of CVA (cerebrovascular accident) 07/20/2012  . Cervicalgia 01/17/2009  . Irritable bowel syndrome (IBS) 01/17/2009  . Urinary incontinence 09/07/2008  . Normocytic anemia 04/26/2008  . HYPERLIPIDEMIA 01/26/2008  . History of TIA (transient ischemic attack) 01/26/2008  . Hypothyroidism 01/04/2008  . Abnormal Pap smear of vagina 09/24/2006    Current Outpatient Medications on File Prior to Visit  Medication Sig Dispense Refill  . Cholecalciferol (VITAMIN D) 125 MCG (5000 UT) CAPS Take 1 capsule by mouth daily.     Marland Kitchen levothyroxine (SYNTHROID) 88 MCG tablet TAKE 1 TABLET BY MOUTH DAILY. (Patient taking differently: Take 88 mcg by mouth daily before breakfast. ) 90 tablet 0  . Pimavanserin Tartrate (NUPLAZID) 34 MG CAPS Take 1 capsule (  34 mg total) by mouth daily. 30 capsule 0  . RYTARY 36.25-145 MG CPCR TAKE 2 TABLETS EACH A.M., 2 IN THE AFTERNOON AND 1 TABLET EACH EVENING. 150 capsule 1  . sertraline (ZOLOFT) 25 MG tablet Take 1 tablet (25 mg total) by mouth at bedtime. (Patient taking differently: Take 25 mg by mouth at bedtime. Taking 1/2 daily) 30 tablet 5  . vitamin B-12 (CYANOCOBALAMIN) 1000 MCG tablet Take 1 tablet (1,000 mcg total) by mouth daily. 30 tablet 0     No current facility-administered medications on file prior to visit.    Past Medical History:  Diagnosis Date  . Abnormal Pap smear of vagina 09/2006   Dr Nori Riis  . Abnormality of gait 05/24/2015   PMH R occipital infarct B12 deficiency H/o polio affected left leg  In Physical Therapy to improve balance & strength through Noel Gerold  . Arthritis 12/25/2015  . Cervicalgia 01/17/2009   MRI 03/27/16:  IMPRESSION: 1. At C3-4 there is a mild broad-based disc bulge. Moderate bilateral facet arthropathy and uncovertebral degenerative changes resulting in moderate bilateral foraminal stenosis. 2. At C5-6 there is a broad-based disc osteophyte complex. Bilateral uncovertebral degenerative changes and facet arthropathy resulting in moderate bilateral foraminal stenosis.  . Fall 09/11/2017  . History of CVA (cerebrovascular accident) 07/20/2012   Chronic R occipital infarct ; stable on CT 05/21/2012 .Dr Jannifer Franklin , Neurology   . History of TIA (transient ischemic attack) 01/26/2008  . HYPERLIPIDEMIA 01/26/2008   NMR Lipoprofile 2009: LDL 135 (1704/911), HDL 62, TG 131. LDL goal = < 100; ideally < 70. MGF MI in 31s No FH CVA  . Hypothyroidism   . Intention tremor 12/08/2013   Onset after fall 04/2012   . Irritable bowel syndrome (IBS) 01/17/2009   Dr. Unice Cobble  . Osteopenia    PMH fracture heel, wrist  . Parkinson disease ([place]) 06/25/2016   Dr Tat  . Pernicious anemia   . Post-polio syndrome    minor problems L leg  . Restless leg syndrome 12/25/2015   Dr Tat  . Subdural hygroma 09/11/2017  . Swallowing disorder 03/04/2017  . Urinary incontinence 09/07/2008   Dr McDiarmid, Urology   . Vitamin B12 deficiency   . Weakness 06/24/2017    Past Surgical History:  Procedure Laterality Date  . BLADDER SUSPENSION  2000  . CATARACT EXTRACTION, BILATERAL     Dr. Gershon Crane  . no colonoscopy     "I never felt I needed one "  . YAG LASER APPLICATION Left    Dr. Gershon Crane    Social History   Socioeconomic  History  . Marital status: Single    Spouse name: Not on file  . Number of children: 0  . Years of education: Not on file  . Highest education level: Some college, no degree  Occupational History  . Not on file  Tobacco Use  . Smoking status: Never Smoker  . Smokeless tobacco: Never Used  Substance and Sexual Activity  . Alcohol use: Yes    Alcohol/week: 1.0 standard drinks    Types: 1 Glasses of wine per week    Comment: 0.6 oz per week  . Drug use: No  . Sexual activity: Not on file  Other Topics Concern  . Not on file  Social History Narrative   One level home - alone   R handed   Caffeine - cup of coffee every am / tea at night    Social Determinants of Health   Financial  Resource Strain:   . Difficulty of Paying Living Expenses:   Food Insecurity:   . Worried About Charity fundraiser in the Last Year:   . Arboriculturist in the Last Year:   Transportation Needs:   . Film/video editor (Medical):   Marland Kitchen Lack of Transportation (Non-Medical):   Physical Activity:   . Days of Exercise per Week:   . Minutes of Exercise per Session:   Stress:   . Feeling of Stress :   Social Connections:   . Frequency of Communication with Friends and Family:   . Frequency of Social Gatherings with Friends and Family:   . Attends Religious Services:   . Active Member of Clubs or Organizations:   . Attends Archivist Meetings:   Marland Kitchen Marital Status:     Family History  Problem Relation Age of Onset  . Heart attack Maternal Grandfather        70s  . Emphysema Mother   . Hypertension Mother   . Alzheimer's disease Father   . Breast cancer Maternal Grandmother   . Cancer Maternal Grandmother        breast  . Diabetes Neg Hx   . Stroke Neg Hx   . Parkinson's disease Neg Hx     Review of Systems  Constitutional: Negative for appetite change, chills and fever.  Respiratory: Negative for shortness of breath.   Cardiovascular: Negative for chest pain, palpitations and  leg swelling.  Gastrointestinal: Positive for constipation (taking metamucil). Negative for abdominal pain, diarrhea and nausea.  Genitourinary: Negative for dysuria and hematuria.  Neurological: Negative for light-headedness and headaches.       Objective:   Vitals:   02/14/20 1530  BP: (!) 164/82  Pulse: 72  Resp: 16  Temp: 97.7 F (36.5 C)  SpO2: 98%   BP Readings from Last 3 Encounters:  02/14/20 (!) 164/82  02/03/20 (!) 138/57  09/13/19 (!) 155/70   Wt Readings from Last 3 Encounters:  02/14/20 73 lb (33.1 kg)  01/31/20 74 lb 15.3 oz (34 kg)  09/13/19 78 lb (35.4 kg)   Body mass index is 13.79 kg/m.   Physical Exam    Constitutional: thin, elderly frail female. No distress.  HENT:  Head: Normocephalic and atraumatic.  Neck: Neck supple. No tracheal deviation present. No thyromegaly present.  No cervical lymphadenopathy Cardiovascular: Normal rate, regular rhythm and normal heart sounds.   No murmur heard. No carotid bruit .  No edema Pulmonary/Chest: Effort normal and breath sounds normal. No respiratory distress. No has no wheezes. No rales.  Abd: soft, NT, ND Skin: Skin is warm and dry. Not diaphoretic.  Psychiatric: Normal mood and affect. Behavior is normal.      Assessment & Plan:    See Problem List for Assessment and Plan of chronic medical problems.    This visit occurred during the SARS-CoV-2 public health emergency.  Safety protocols were in place, including screening questions prior to the visit, additional usage of staff PPE, and extensive cleaning of exam room while observing appropriate contact time as indicated for disinfecting solutions.

## 2020-02-13 NOTE — Telephone Encounter (Signed)
LVM letting Elizabeth [name] know response below.

## 2020-02-14 ENCOUNTER — Other Ambulatory Visit: Payer: Self-pay

## 2020-02-14 ENCOUNTER — Encounter: Payer: Self-pay | Admitting: Internal Medicine

## 2020-02-14 ENCOUNTER — Ambulatory Visit: Payer: Medicare Other | Admitting: Internal Medicine

## 2020-02-14 VITALS — BP 164/82 | HR 72 | Temp 97.7°F | Resp 16 | Ht 61.0 in | Wt 73.0 lb

## 2020-02-14 DIAGNOSIS — E871 Hypo-osmolality and hyponatremia: Secondary | ICD-10-CM

## 2020-02-14 DIAGNOSIS — E034 Atrophy of thyroid (acquired): Secondary | ICD-10-CM | POA: Diagnosis not present

## 2020-02-14 DIAGNOSIS — R5381 Other malaise: Secondary | ICD-10-CM

## 2020-02-14 DIAGNOSIS — A419 Sepsis, unspecified organism: Secondary | ICD-10-CM | POA: Diagnosis not present

## 2020-02-14 DIAGNOSIS — N390 Urinary tract infection, site not specified: Secondary | ICD-10-CM

## 2020-02-14 DIAGNOSIS — E876 Hypokalemia: Secondary | ICD-10-CM | POA: Diagnosis not present

## 2020-02-14 DIAGNOSIS — G20 Parkinson's disease: Secondary | ICD-10-CM

## 2020-02-14 DIAGNOSIS — F419 Anxiety disorder, unspecified: Secondary | ICD-10-CM

## 2020-02-14 DIAGNOSIS — D649 Anemia, unspecified: Secondary | ICD-10-CM | POA: Diagnosis not present

## 2020-02-14 NOTE — Assessment & Plan Note (Signed)
Chronic Anemia worsened w hydration in Ed, then stabilized Cbc today

## 2020-02-14 NOTE — Assessment & Plan Note (Signed)
Chronic Not ideally controlled but she does not want to increase dose of medication  Continue sertraline 12.5 mg daily

## 2020-02-14 NOTE — Assessment & Plan Note (Signed)
Resolved with antibiotics Denies UTI like symptoms - will try to get a urine sample today to recheck

## 2020-02-14 NOTE — Assessment & Plan Note (Signed)
repleted in hospital bmp

## 2020-02-14 NOTE — Assessment & Plan Note (Signed)
chronic Has improved with home PT - continue Uses walker

## 2020-02-14 NOTE — Assessment & Plan Note (Signed)
Secondary to dehydration Resolved with IVF bmp

## 2020-02-14 NOTE — Assessment & Plan Note (Signed)
Continue current dose of medication

## 2020-02-15 LAB — CBC WITH DIFFERENTIAL/PLATELET
Basophils Absolute: 0.1 10*3/uL (ref 0.0–0.1)
Basophils Relative: 1.3 % (ref 0.0–3.0)
Eosinophils Absolute: 0.1 10*3/uL (ref 0.0–0.7)
Eosinophils Relative: 2.2 % (ref 0.0–5.0)
HCT: 33.7 % — ABNORMAL LOW (ref 36.0–46.0)
Hemoglobin: 11.1 g/dL — ABNORMAL LOW (ref 12.0–15.0)
Lymphocytes Relative: 22.6 % (ref 12.0–46.0)
Lymphs Abs: 1.2 10*3/uL (ref 0.7–4.0)
MCHC: 33.1 g/dL (ref 30.0–36.0)
MCV: 86.4 fl (ref 78.0–100.0)
Monocytes Absolute: 0.4 10*3/uL (ref 0.1–1.0)
Monocytes Relative: 7 % (ref 3.0–12.0)
Neutro Abs: 3.6 10*3/uL (ref 1.4–7.7)
Neutrophils Relative %: 66.9 % (ref 43.0–77.0)
Platelets: 430 10*3/uL — ABNORMAL HIGH (ref 150.0–400.0)
RBC: 3.9 Mil/uL (ref 3.87–5.11)
RDW: 15.2 % (ref 11.5–15.5)
WBC: 5.4 10*3/uL (ref 4.0–10.5)

## 2020-02-15 LAB — BASIC METABOLIC PANEL
BUN: 23 mg/dL (ref 6–23)
CO2: 26 mEq/L (ref 19–32)
Calcium: 9.4 mg/dL (ref 8.4–10.5)
Chloride: 101 mEq/L (ref 96–112)
Creatinine, Ser: 0.66 mg/dL (ref 0.40–1.20)
GFR: 84.71 mL/min (ref >60.00)
Glucose, Bld: 83 mg/dL (ref 70–99)
Potassium: 4.1 mEq/L (ref 3.5–5.1)
Sodium: 138 mEq/L (ref 135–145)

## 2020-02-15 LAB — URINALYSIS, ROUTINE W REFLEX MICROSCOPIC
Bilirubin Urine: NEGATIVE
Hgb urine dipstick: NEGATIVE
Ketones, ur: NEGATIVE
Nitrite: NEGATIVE
RBC / HPF: NONE SEEN (ref 0–?)
Specific Gravity, Urine: 1.02 (ref 1.000–1.030)
Total Protein, Urine: NEGATIVE
Urine Glucose: NEGATIVE
Urobilinogen, UA: 0.2 (ref 0.0–1.0)
pH: 6 (ref 5.0–8.0)

## 2020-03-07 ENCOUNTER — Telehealth: Payer: Self-pay | Admitting: *Deleted

## 2020-03-07 DIAGNOSIS — A419 Sepsis, unspecified organism: Secondary | ICD-10-CM

## 2020-03-07 NOTE — Telephone Encounter (Signed)
Called pt- no answer. Patient needs to come back to [facility] or [place] lab to collect a urine specimen for a urine culture that was originally ordered on 02/14/20 per PCP. Patient was unable to collect specimen on 02/14/20. Will try calling pt again later.

## 2020-03-07 NOTE — Addendum Note (Signed)
Addended by: Cresenciano Lick on: 03/07/2020 04:00 PM   Modules accepted: Orders

## 2020-03-13 ENCOUNTER — Ambulatory Visit: Payer: Medicare Other | Admitting: Neurology

## 2020-03-13 DIAGNOSIS — G14 Postpolio syndrome: Secondary | ICD-10-CM

## 2020-03-13 DIAGNOSIS — D510 Vitamin B12 deficiency anemia due to intrinsic factor deficiency: Secondary | ICD-10-CM

## 2020-03-13 DIAGNOSIS — K589 Irritable bowel syndrome without diarrhea: Secondary | ICD-10-CM

## 2020-03-13 DIAGNOSIS — B964 Proteus (mirabilis) (morganii) as the cause of diseases classified elsewhere: Secondary | ICD-10-CM

## 2020-03-13 DIAGNOSIS — G20 Parkinson's disease: Secondary | ICD-10-CM

## 2020-03-13 DIAGNOSIS — M810 Age-related osteoporosis without current pathological fracture: Secondary | ICD-10-CM

## 2020-03-13 DIAGNOSIS — E039 Hypothyroidism, unspecified: Secondary | ICD-10-CM

## 2020-03-13 DIAGNOSIS — F419 Anxiety disorder, unspecified: Secondary | ICD-10-CM

## 2020-03-13 DIAGNOSIS — M1991 Primary osteoarthritis, unspecified site: Secondary | ICD-10-CM

## 2020-03-13 DIAGNOSIS — N390 Urinary tract infection, site not specified: Secondary | ICD-10-CM

## 2020-03-13 DIAGNOSIS — G2581 Restless legs syndrome: Secondary | ICD-10-CM

## 2020-03-13 DIAGNOSIS — Z8673 Personal history of transient ischemic attack (TIA), and cerebral infarction without residual deficits: Secondary | ICD-10-CM
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
## 2020-03-15 ENCOUNTER — Encounter: Payer: Self-pay | Admitting: Neurology

## 2020-03-15 ENCOUNTER — Other Ambulatory Visit: Payer: Self-pay

## 2020-03-15 ENCOUNTER — Telehealth (INDEPENDENT_AMBULATORY_CARE_PROVIDER_SITE_OTHER): Payer: Medicare Other | Admitting: Neurology

## 2020-03-15 VITALS — Ht 60.0 in | Wt 80.0 lb

## 2020-03-15 DIAGNOSIS — F0280 Dementia in other diseases classified elsewhere without behavioral disturbance: Secondary | ICD-10-CM

## 2020-03-15 DIAGNOSIS — G20 Parkinson's disease: Secondary | ICD-10-CM | POA: Diagnosis not present

## 2020-03-15 NOTE — Telephone Encounter (Signed)
If I recall she has difficulty giving a urine sample -- ideally she should give a sample but if she can not - an antibiotic is pending.

## 2020-03-15 NOTE — Telephone Encounter (Signed)
Would you like orders put in?

## 2020-03-15 NOTE — Telephone Encounter (Signed)
New message   Elizabeth [name] calling for the patient asking can Dr. Quay Burow called in something for her C/o UTI with a strong odor and urine is yellow.   Offer appt with MD [name] decline appt with MD.

## 2020-03-16 MED ORDER — SULFAMETHOXAZOLE-TRIMETHOPRIM 400-80 MG PO TABS: 1.0000 | ORAL_TABLET | Freq: Two times a day (BID) | ORAL | 0 refills | Status: AC

## 2020-04-09 ENCOUNTER — Telehealth: Payer: Self-pay | Admitting: Internal Medicine

## 2020-04-09 NOTE — Telephone Encounter (Signed)
Since this is neurological she should discuss with her neurologist - I am not sure what can be done.

## 2020-04-09 NOTE — Telephone Encounter (Signed)
    Patient wants to know what can be done about  "side of mouth that is partially closed because of Parkinson's"  Patient declined appointment

## 2020-04-09 NOTE — Telephone Encounter (Signed)
Pt aware of response below.  

## 2020-04-17 ENCOUNTER — Emergency Department (HOSPITAL_COMMUNITY): Payer: Medicare Other

## 2020-04-17 ENCOUNTER — Encounter (HOSPITAL_COMMUNITY): Payer: Self-pay | Admitting: Emergency Medicine

## 2020-04-17 ENCOUNTER — Emergency Department (HOSPITAL_COMMUNITY)
Admission: EM | Admit: 2020-04-17 | Discharge: 2020-04-17 | Disposition: A | Discharge status: Home / Self Care | Payer: Medicare Other | Attending: Emergency Medicine | Admitting: Emergency Medicine

## 2020-04-17 DIAGNOSIS — Y999 Unspecified external cause status: Secondary | ICD-10-CM | POA: Diagnosis not present

## 2020-04-17 DIAGNOSIS — S52692A Other fracture of lower end of left ulna, initial encounter for closed fracture: Secondary | ICD-10-CM | POA: Diagnosis not present

## 2020-04-17 DIAGNOSIS — Y929 Unspecified place or not applicable: Secondary | ICD-10-CM | POA: Diagnosis not present

## 2020-04-17 DIAGNOSIS — S6992XA Unspecified injury of left wrist, hand and finger(s), initial encounter: Secondary | ICD-10-CM | POA: Diagnosis present

## 2020-04-17 DIAGNOSIS — S52592A Other fractures of lower end of left radius, initial encounter for closed fracture: Secondary | ICD-10-CM | POA: Diagnosis not present

## 2020-04-17 DIAGNOSIS — W19XXXA Unspecified fall, initial encounter: Secondary | ICD-10-CM

## 2020-04-17 DIAGNOSIS — Y9389 Activity, other specified: Secondary | ICD-10-CM | POA: Diagnosis not present

## 2020-04-17 DIAGNOSIS — W1839XA Other fall on same level, initial encounter: Secondary | ICD-10-CM | POA: Diagnosis not present

## 2020-04-17 DIAGNOSIS — M859 Disorder of bone density and structure, unspecified: Secondary | ICD-10-CM | POA: Diagnosis not present

## 2020-04-17 DIAGNOSIS — S52502A Unspecified fracture of the lower end of left radius, initial encounter for closed fracture: Secondary | ICD-10-CM

## 2020-04-17 MED ORDER — ACETAMINOPHEN 500 MG PO TABS
1000.0000 mg | ORAL_TABLET | Freq: Once | ORAL | Status: AC
  Administered 2020-04-17: 1000 mg via ORAL
  Filled 2020-04-17: qty 2

## 2020-04-17 NOTE — ED Provider Notes (Signed)
[facility] DEPT Provider Note   CSN: [id number] Arrival date & time: 04/17/20  1800     History Chief Complaint  Patient presents with  . Wrist Pain  . Fall    Elizabeth [name] is a 86 y.o. female.  HPI 86 year old female with a history of hypothyroidism, osteopenia, Parkinson's, hyperlipidemia, pernicious anemia presents to the ER after a mechanical fall which occurred earlier today.  History provided by patient and daughter at bedside.  Patient states that she was reaching up for something in the upper cabinet when she lost her balance and fell backwards.  She reports pain and swelling to the left wrist.  Patient states that she hit her left hand/wrist on a table on her way down.  She denies head injury, LOC, back pain, numbness or tingling to the left hand.  She has been able to move her left hand with pain.  She has no other pains at this time.  She is not on blood thinners.  No dizziness, weakness, abdominal pain, chest pain, shortness of breath.  Patient normally ambulates with a walker, daughter notes that there have been no changes in her ambulation status since the fall.    Past Medical History:  Diagnosis Date  . Abnormal Pap smear of vagina 09/2006   Dr Nori Riis  . Abnormality of gait 05/24/2015   PMH R occipital infarct B12 deficiency H/o polio affected left leg  In Physical Therapy to improve balance & strength through Noel Gerold  . Arthritis 12/25/2015  . Cervicalgia 01/17/2009   MRI 03/27/16:  IMPRESSION: 1. At C3-4 there is a mild broad-based disc bulge. Moderate bilateral facet arthropathy and uncovertebral degenerative changes resulting in moderate bilateral foraminal stenosis. 2. At C5-6 there is a broad-based disc osteophyte complex. Bilateral uncovertebral degenerative changes and facet arthropathy resulting in moderate bilateral foraminal stenosis.  . Fall 09/11/2017  . History of CVA (cerebrovascular accident) 07/20/2012   Chronic R occipital  infarct ; stable on CT 05/21/2012 .Dr Jannifer Franklin , Neurology   . History of TIA (transient ischemic attack) 01/26/2008  . HYPERLIPIDEMIA 01/26/2008   NMR Lipoprofile 2009: LDL 135 (1704/911), HDL 62, TG 131. LDL goal = < 100; ideally < 70. MGF MI in 4s No FH CVA  . Hypothyroidism   . Intention tremor 12/08/2013   Onset after fall 04/2012   . Irritable bowel syndrome (IBS) 01/17/2009   Dr. Unice Cobble  . Osteopenia    PMH fracture heel, wrist  . Parkinson disease ([place]) 06/25/2016   Dr Tat  . Pernicious anemia   . Post-polio syndrome    minor problems L leg  . Restless leg syndrome 12/25/2015   Dr Tat  . Subdural hygroma 09/11/2017  . Swallowing disorder 03/04/2017  . Urinary incontinence 09/07/2008   Dr McDiarmid, Urology   . Vitamin B12 deficiency   . Weakness 06/24/2017    Patient Active Problem List   Diagnosis Date Noted  . Sepsis secondary to UTI ([place]) 01/31/2020  . Hypokalemia 01/31/2020  . Hyponatremia 01/31/2020  . Constipation 01/22/2019  . Malnutrition of moderate degree 12/17/2018  . Pars defect of lumbar spine 12/16/2018  . Compression fracture of L3 vertebra ([place]) 11/02/2018  . Right shoulder pain 11/02/2018  . Left groin pain 10/19/2018  . Low back pain 10/19/2018  . Dysuria 10/12/2018  . Osteoporosis 05/30/2018  . Poor posture 04/28/2018  . Physical deconditioning 04/28/2018  . Anxiety 03/02/2018  . Foot pain, bilateral 12/21/2017  . Bilateral leg edema  12/02/2017  . Overactive bladder 09/19/2017  . Post-polio syndrome   . Vitamin B12 deficiency   . Fall 09/11/2017  . Subdural hygroma 09/11/2017  . Swallowing disorder 03/04/2017  . Parkinson disease ([place]) 06/25/2016  . Restless leg syndrome 12/25/2015  . Arthritis 12/25/2015  . Abnormality of gait 05/24/2015  . History of CVA (cerebrovascular accident) 07/20/2012  . Cervicalgia 01/17/2009  . Irritable bowel syndrome (IBS) 01/17/2009  . Urinary incontinence 09/07/2008  . Normocytic anemia 04/26/2008  .  HYPERLIPIDEMIA 01/26/2008  . History of TIA (transient ischemic attack) 01/26/2008  . Hypothyroidism 01/04/2008  . Abnormal Pap smear of vagina 09/24/2006    Past Surgical History:  Procedure Laterality Date  . BLADDER SUSPENSION  2000  . CATARACT EXTRACTION, BILATERAL     Dr. Gershon Crane  . no colonoscopy     "I never felt I needed one "  . YAG LASER APPLICATION Left    Dr. Gershon Crane     OB History   No obstetric history on file.     Family History  Problem Relation Age of Onset  . Heart attack Maternal Grandfather        70s  . Emphysema Mother   . Hypertension Mother   . Alzheimer's disease Father   . Breast cancer Maternal Grandmother   . Cancer Maternal Grandmother        breast  . Diabetes Neg Hx   . Stroke Neg Hx   . Parkinson's disease Neg Hx     Social History   Tobacco Use  . Smoking status: Never Smoker  . Smokeless tobacco: Never Used  Substance Use Topics  . Alcohol use: Yes    Alcohol/week: 1.0 standard drinks    Types: 1 Glasses of wine per week    Comment: 0.6 oz per week  . Drug use: No    Home Medications Prior to Admission medications   Medication Sig Start Date End Date Taking? Authorizing Provider  Cholecalciferol (VITAMIN D) 125 MCG (5000 UT) CAPS Take 1 capsule by mouth daily.     [provider]  levothyroxine (SYNTHROID) 88 MCG tablet TAKE 1 TABLET BY MOUTH DAILY. 01/20/20   Binnie Rail, MD  Pimavanserin Tartrate (NUPLAZID) 34 MG CAPS Take 1 capsule (34 mg total) by mouth daily. 01/20/20   Tat, Eustace Quail, DO  RYTARY 36.25-145 MG CPCR TAKE 2 TABLETS EACH A.M., 2 IN THE AFTERNOON AND 1 TABLET EACH EVENING. 02/06/20   Tat, Eustace Quail, DO  sertraline (ZOLOFT) 25 MG tablet Take 1 tablet (25 mg total) by mouth at bedtime. Patient taking differently: Take 25 mg by mouth at bedtime. Taking 1/2 daily 09/07/19   Binnie Rail, MD  vitamin B-12 (CYANOCOBALAMIN) 1000 MCG tablet Take 1 tablet (1,000 mcg total) by mouth daily. 01/10/19    Hennie Duos, MD    Allergies    Atorvastatin, Crestor [rosuvastatin calcium], and Vesicare [solifenacin]  Review of Systems   Review of Systems  Respiratory: Negative for shortness of breath.   Cardiovascular: Negative for chest pain.  Musculoskeletal: Positive for arthralgias. Negative for back pain.  Skin: Positive for color change.  Neurological: Negative for dizziness, syncope, weakness, numbness and headaches.  Psychiatric/Behavioral: Negative for confusion.    Physical Exam Updated Vital Signs BP (!) 172/64 (BP Location: Left Arm)   Pulse 72   Temp 98.1 F (36.7 C) (Oral)   Resp 17   SpO2 99%   Physical Exam Vitals and nursing note reviewed.  Constitutional:  General: She is not in acute distress.    Appearance: She is well-developed. She is not ill-appearing or toxic-appearing.  HENT:     Head: Normocephalic and atraumatic.     Comments: No noticeable wounds, crepitus, step-offs    Nose: Nose normal.     Mouth/Throat:     Mouth: Mucous membranes are moist.     Pharynx: Oropharynx is clear.  Eyes:     Extraocular Movements: Extraocular movements intact.     Conjunctiva/sclera: Conjunctivae normal.     Pupils: Pupils are equal, round, and reactive to light.  Cardiovascular:     Rate and Rhythm: Normal rate and regular rhythm.     Pulses: Normal pulses.     Heart sounds: Normal heart sounds. No murmur.  Pulmonary:     Effort: Pulmonary effort is normal. No respiratory distress.     Breath sounds: Normal breath sounds.  Abdominal:     General: Abdomen is flat.     Palpations: Abdomen is soft.     Tenderness: There is no abdominal tenderness.  Musculoskeletal:        General: Swelling, tenderness, deformity and signs of injury present. Normal range of motion.     Cervical back: Normal range of motion and neck supple. No rigidity or tenderness.     Comments: Left wrist with significant swelling, bruising, deformity.  2+ radial pulses bilaterally, good  cap refill, sensation intact, range of motion limited secondary to pain.  Patient able to move all 5 digits, mildly decreased strength secondary to pain.  Full range of motion of elbow.  No midline tenderness to C, T, L-spine.  Patient moving all 4 extremities without difficulty.  5/5 strength in upper and lower extremities.  Skin:    General: Skin is warm and dry.     Capillary Refill: Capillary refill takes less than 2 seconds.     Findings: Bruising present.  Neurological:     General: No focal deficit present.     Mental Status: She is alert and oriented to person, place, and time.     Cranial Nerves: No cranial nerve deficit.     Sensory: No sensory deficit.     Motor: No weakness.  Psychiatric:        Mood and Affect: Mood normal.        Behavior: Behavior normal.     ED Results / Procedures / Treatments   Labs (all labs ordered are listed, but only abnormal results are displayed) Labs Reviewed - No data to display  EKG None  Radiology DG Wrist Complete Left  Result Date: 04/17/2020 CLINICAL DATA:  [place] today and injured left hand and wrist. EXAM: LEFT HAND - COMPLETE 3+ VIEW; LEFT WRIST - COMPLETE 3+ VIEW COMPARISON:  None. FINDINGS: Complex comminuted intra-articular fracture of the distal radius with significant dorsal displacement of 1 shaft width. There is also a comminuted fracture of the distal ulna. The carpal and metacarpal bones are intact. Chondrocalcinosis is noted. Severe degenerative changes at the [facility] joint of the thumb. There are also changes of erosive osteoarthritis involving the interphalangeal joints of the hand. No acute hand fractures. IMPRESSION: 1. Complex comminuted and dorsally displaced intra-articular fracture of the distal radius. 2. Comminuted fracture of the distal ulna. 3. Advanced degenerative changes involving the [facility] joint of the thumb and the interphalangeal joints of the hand. Electronically Signed   By: Marijo Sanes M.D.   On: 04/17/2020 19:12    DG Hand Complete Left  Result  Date: 04/17/2020 CLINICAL DATA:  [place] today and injured left hand and wrist. EXAM: LEFT HAND - COMPLETE 3+ VIEW; LEFT WRIST - COMPLETE 3+ VIEW COMPARISON:  None. FINDINGS: Complex comminuted intra-articular fracture of the distal radius with significant dorsal displacement of 1 shaft width. There is also a comminuted fracture of the distal ulna. The carpal and metacarpal bones are intact. Chondrocalcinosis is noted. Severe degenerative changes at the [facility] joint of the thumb. There are also changes of erosive osteoarthritis involving the interphalangeal joints of the hand. No acute hand fractures. IMPRESSION: 1. Complex comminuted and dorsally displaced intra-articular fracture of the distal radius. 2. Comminuted fracture of the distal ulna. 3. Advanced degenerative changes involving the [facility] joint of the thumb and the interphalangeal joints of the hand. Electronically Signed   By: Marijo Sanes M.D.   On: 04/17/2020 19:12    Procedures Procedures (including critical care time)  Medications Ordered in ED Medications - No data to display  ED Course  I have reviewed the triage vital signs and the nursing notes.  Pertinent labs & imaging results that were available during my care of the patient were reviewed by me and considered in my medical decision making (see chart for details).  Clinical Course as of Apr 17 2041  Tue Apr 17, 2020  2037  DG LEFT HAND AND WRIST  IMPRESSION: 1. Complex comminuted and dorsally displaced intra-articular fracture of the distal radius. 2. Comminuted fracture of the distal ulna. 3. Advanced degenerative changes involving the [facility] joint of the thumb and the interphalangeal joints of the hand.      [MB]    Clinical Course User Index [MB] Lyndel Safe   MDM Rules/Calculators/A&P                     86 year old female presents to the ER after mechanical fall with left wrist pain. On presentation, the patient is  overall well-appearing, no acute distress.  Vital signs overall reassuring, though she is hypertensive which seems to be consistent with her previous ED visits. Plain films of left wrist with complex comminuted and dorsally displaced intra-articular fracture of the distal radius and comminuted fracture of distal ulna.  Advanced generative changes involving the [facility] joint of the thumb and interphalangeal joints were also noted.  Patient with no sensory or strength deficits.  Good radial pulses.  No other evidence of head injury, LOC, no midline tenderness to the back.  Patient moving all 4 extremities without difficulty.  Injury consistent with mechanical fall, patient denies any dizziness, weakness, chest pain at the time of the fall.  Range of motion of left wrist still intact though painful. I do not think any additional work-up is needed at this time.    Consulted Dr. Lorin Mercy with Orthopedics, he suggested a sugar tong splint and follow-up with him in the office.  Patient was placed in sugar tong splint by Ortho tech.  Dr. Maryan Rued also saw and evaluated the patient, she warned the patient at length that she cannot use her walker as she cannot put pressure on her left arm.  Instructed to take Tylenol/ibuprofen for pain.  Stressed phone call to Dr. Narda Amber office in the morning.  Patient and daughter voiced understanding and are agreeable to this plan.  At this stage in the ED course, the patient has been adequately screened and is stable for discharge.  Dr. Maryan Rued is agreeable to the above plan. Final Clinical Impression(s) / ED Diagnoses Final diagnoses:  Closed fracture distal radius and ulna, left, initial encounter  Fall, initial encounter    Rx / DC Orders ED Discharge Orders    None       Lyndel Safe 04/17/20 2042    Blanchie Dessert, MD 04/17/20 2047

## 2020-04-17 NOTE — ED Triage Notes (Signed)
Patient here from home with complaints of fall today. Left wrist deformity and bruising noted.

## 2020-04-17 NOTE — Discharge Instructions (Addendum)
You were seen in the ER after a fall.  Your x-ray showed fractures of your radius and ulna which are 2 bones in your arm.  The fractures were down by her wrist.  You were splinted today, please do not use your walker as you cannot put pressure on your left arm.  Please use a wheelchair to ambulate until you see the orthopedic doctor.  Please call the phone number for Dr. Lorin Mercy tomorrow morning and schedule an appointment.  You may take Tylenol and ibuprofen over-the-counter for pain.  Return to the ER if you have worsening pain, signs of infection which includes increased redness, swelling, numbness, tingling, fevers, chills.

## 2020-04-18 ENCOUNTER — Ambulatory Visit: Payer: Self-pay

## 2020-04-18 ENCOUNTER — Ambulatory Visit (INDEPENDENT_AMBULATORY_CARE_PROVIDER_SITE_OTHER): Payer: Medicare Other | Admitting: Orthopaedic Surgery

## 2020-04-18 ENCOUNTER — Other Ambulatory Visit: Payer: Self-pay

## 2020-04-18 ENCOUNTER — Telehealth: Payer: Self-pay | Admitting: Orthopaedic Surgery

## 2020-04-18 DIAGNOSIS — M25532 Pain in left wrist: Secondary | ICD-10-CM | POA: Diagnosis not present

## 2020-04-18 DIAGNOSIS — S52502A Unspecified fracture of the lower end of left radius, initial encounter for closed fracture: Secondary | ICD-10-CM

## 2020-04-18 MED ORDER — TRAMADOL HCL 50 MG PO TABS: 50.0000 mg | ORAL_TABLET | Freq: Four times a day (QID) | ORAL | 0 refills | Status: DC | PRN

## 2020-04-18 NOTE — Telephone Encounter (Signed)
Please advise 

## 2020-04-18 NOTE — Telephone Encounter (Signed)
LMOM for patient of the below message  

## 2020-04-18 NOTE — Progress Notes (Signed)
Office Visit Note   Patient: Elizabeth [name]           Date of Birth: [birth year]           MRN: [id number] Visit Date: 04/18/2020              Requested by: Binnie Rail, MD [place],  [place] [id number] PCP: Binnie Rail, MD   Assessment & Plan: Visit Diagnoses:  1. Pain in left wrist   2. Closed fracture of distal ends of left radius and ulna, initial encounter     Plan: We reviewed deposition of her wrist and took the splint off performed hematoma block after Betadine prep placed in sugar tong for reduction and proceeded with reduction and sugar tong splint application.  We are unable to get both bones locked on but she did have improvement in the angulation and an improvement in length.  Some correction of dorsal angulation of at least 50%.  We will recheck her in a week we discussed that she may get some slippage.  She is not interested in operative intervention and we discussed problems with severe osteopenia or osteoporosis and hardware failure.  She has had minimal pain is taking Tylenol.  Repeat x-rays on return. Follow-Up Instructions: No follow-ups on file.   Orders:  Orders Placed This Encounter  Procedures  . XR Wrist 2 Views Left  . XR Wrist 2 Views Left   Meds ordered this encounter  Medications  . traMADol (ULTRAM) 50 MG tablet    Sig: Take 1 tablet (50 mg total) by mouth every 6 (six) hours as needed.    Dispense:  30 tablet    Refill:  0      Procedures: No procedures performed   Clinical Data: No additional findings.   Subjective: Chief Complaint  Patient presents with  . Left Wrist - Fracture    Fall 04/17/2020    HPI 86 year old female referred from emergency room after a fall on 04/17/2020 with left distal radius and ulna displaced fractures treated in a sugar tong splint.  She not been taking any medication for pain and x-ray demonstrates 45 degrees angulation and displacement of the fracture.  She has significant history with  gait problems history of fall previous CVA history of TIA hyperlipidemia L3 compression fracture, Parkinson's disease, post polio syndrome.  Review of Systems noncontributory other than as mentioned HPI.   Objective: Vital Signs: There were no vitals taken for this visit.  Physical Exam patient is elderly she has significant thoracic kyphosis.  She is in a sugar tong splint she only has mild finger swelling good finger range of motion.  Ortho Exam distal radius and ulna angulated fracture with radial angulation and dorsal displacement.  Median and ulnar sensation is intact.  Specialty Comments:  No specialty comments available.  Imaging: No results found.   PMFS History: Patient Active Problem List   Diagnosis Date Noted  . Radius and ulna distal fracture 04/24/2020  . Sepsis secondary to UTI ([place]) 01/31/2020  . Hypokalemia 01/31/2020  . Hyponatremia 01/31/2020  . Constipation 01/22/2019  . Malnutrition of moderate degree 12/17/2018  . Pars defect of lumbar spine 12/16/2018  . Compression fracture of L3 vertebra ([place]) 11/02/2018  . Right shoulder pain 11/02/2018  . Left groin pain 10/19/2018  . Low back pain 10/19/2018  . Dysuria 10/12/2018  . Osteoporosis 05/30/2018  . Poor posture 04/28/2018  . Physical deconditioning 04/28/2018  . Anxiety 03/02/2018  .  Foot pain, bilateral 12/21/2017  . Bilateral leg edema 12/02/2017  . Overactive bladder 09/19/2017  . Post-polio syndrome   . Vitamin B12 deficiency   . Fall 09/11/2017  . Subdural hygroma 09/11/2017  . Swallowing disorder 03/04/2017  . Parkinson disease ([place]) 06/25/2016  . Restless leg syndrome 12/25/2015  . Arthritis 12/25/2015  . Abnormality of gait 05/24/2015  . History of CVA (cerebrovascular accident) 07/20/2012  . Cervicalgia 01/17/2009  . Irritable bowel syndrome (IBS) 01/17/2009  . Urinary incontinence 09/07/2008  . Normocytic anemia 04/26/2008  . HYPERLIPIDEMIA 01/26/2008  . History of TIA (transient  ischemic attack) 01/26/2008  . Hypothyroidism 01/04/2008  . Abnormal Pap smear of vagina 09/24/2006   Past Medical History:  Diagnosis Date  . Abnormal Pap smear of vagina 09/2006   Dr Nori Riis  . Abnormality of gait 05/24/2015   PMH R occipital infarct B12 deficiency H/o polio affected left leg  In Physical Therapy to improve balance & strength through Noel Gerold  . Arthritis 12/25/2015  . Cervicalgia 01/17/2009   MRI 03/27/16:  IMPRESSION: 1. At C3-4 there is a mild broad-based disc bulge. Moderate bilateral facet arthropathy and uncovertebral degenerative changes resulting in moderate bilateral foraminal stenosis. 2. At C5-6 there is a broad-based disc osteophyte complex. Bilateral uncovertebral degenerative changes and facet arthropathy resulting in moderate bilateral foraminal stenosis.  . Fall 09/11/2017  . History of CVA (cerebrovascular accident) 07/20/2012   Chronic R occipital infarct ; stable on CT 05/21/2012 .Dr Jannifer Franklin , Neurology   . History of TIA (transient ischemic attack) 01/26/2008  . HYPERLIPIDEMIA 01/26/2008   NMR Lipoprofile 2009: LDL 135 (1704/911), HDL 62, TG 131. LDL goal = < 100; ideally < 70. MGF MI in 43s No FH CVA  . Hypothyroidism   . Intention tremor 12/08/2013   Onset after fall 04/2012   . Irritable bowel syndrome (IBS) 01/17/2009   Dr. Unice Cobble  . Osteopenia    PMH fracture heel, wrist  . Parkinson disease ([place]) 06/25/2016   Dr Tat  . Pernicious anemia   . Post-polio syndrome    minor problems L leg  . Restless leg syndrome 12/25/2015   Dr Tat  . Subdural hygroma 09/11/2017  . Swallowing disorder 03/04/2017  . Urinary incontinence 09/07/2008   Dr McDiarmid, Urology   . Vitamin B12 deficiency   . Weakness 06/24/2017    Family History  Problem Relation Age of Onset  . Heart attack Maternal Grandfather        70s  . Emphysema Mother   . Hypertension Mother   . Alzheimer's disease Father   . Breast cancer Maternal Grandmother   . Cancer Maternal  Grandmother        breast  . Diabetes Neg Hx   . Stroke Neg Hx   . Parkinson's disease Neg Hx     Past Surgical History:  Procedure Laterality Date  . BLADDER SUSPENSION  2000  . CATARACT EXTRACTION, BILATERAL     Dr. Gershon Crane  . no colonoscopy     "I never felt I needed one "  . YAG LASER APPLICATION Left    Dr. Gershon Crane   Social History   Occupational History  . Not on file  Tobacco Use  . Smoking status: Never Smoker  . Smokeless tobacco: Never Used  Substance and Sexual Activity  . Alcohol use: Yes    Alcohol/week: 1.0 standard drinks    Types: 1 Glasses of wine per week    Comment: 0.6 oz per week  .  Drug use: No  . Sexual activity: Not on file       

## 2020-04-18 NOTE — Telephone Encounter (Signed)
Yes OK

## 2020-04-18 NOTE — Telephone Encounter (Signed)
Patient called. She would like to know if she can use her walker to go to the restroom and walk around the house.

## 2020-04-19 ENCOUNTER — Telehealth: Payer: Self-pay

## 2020-04-19 NOTE — Telephone Encounter (Signed)
Appointment scheduled for tomorrow.

## 2020-04-19 NOTE — Progress Notes (Signed)
Subjective:    Patient ID: Elizabeth [name], female    DOB: October 28, 1931, 86 y.o.   MRN: [id number]  HPI The patient is here for follow up of their chronic medical problems, including parkinson's disease, hypothyroidism, anxiety, history of cva, physical deconditioning/history of falls and for a mobility evaluation.   Reason for Visit: Mobility Evaluation   (manual mobility) Patient suffers from (diagnosis) which impairs their ability to perform daily activities like (toileting, feeding, dressing, grooming, bathing). In the home. A (cane, walker, crutch) will not resolve issue with performing activities of daily living. A wheelchair will allow patient to safely perform daily activities. Patient can safely propel the wheel chair in the home or has a caregiver who can provide assistance.   (power mobility) A power wheel chair will assist the patient in the home with ADLs; including cooking, cleaning and toilet ing. A cane, walker or manual wheel chair will not assist patient efficiently due to obesity, upper extremity pain and weakness. In addition, a power scooter will not be adequate due to the patients lack of ability to grip the tiller type steering of the equipment. Patient has the capacity to follow the electric devices safety instructions and is willing to use in the home as well as outside of the home.     ADD the Upper and Lower Extremity Strength Measurement 0-5 scale     Medications and allergies reviewed with patient and updated if appropriate.  Patient Active Problem List   Diagnosis Date Noted  . Sepsis secondary to UTI ([place]) 01/31/2020  . Hypokalemia 01/31/2020  . Hyponatremia 01/31/2020  . Constipation 01/22/2019  . Malnutrition of moderate degree 12/17/2018  . Pars defect of lumbar spine 12/16/2018  . Compression fracture of L3 vertebra ([place]) 11/02/2018  . Right shoulder pain 11/02/2018  . Left groin pain 10/19/2018  . Low back pain 10/19/2018  . Dysuria 10/12/2018  .  Osteoporosis 05/30/2018  . Poor posture 04/28/2018  . Physical deconditioning 04/28/2018  . Anxiety 03/02/2018  . Foot pain, bilateral 12/21/2017  . Bilateral leg edema 12/02/2017  . Overactive bladder 09/19/2017  . Post-polio syndrome   . Vitamin B12 deficiency   . Fall 09/11/2017  . Subdural hygroma 09/11/2017  . Swallowing disorder 03/04/2017  . Parkinson disease ([place]) 06/25/2016  . Restless leg syndrome 12/25/2015  . Arthritis 12/25/2015  . Abnormality of gait 05/24/2015  . History of CVA (cerebrovascular accident) 07/20/2012  . Cervicalgia 01/17/2009  . Irritable bowel syndrome (IBS) 01/17/2009  . Urinary incontinence 09/07/2008  . Normocytic anemia 04/26/2008  . HYPERLIPIDEMIA 01/26/2008  . History of TIA (transient ischemic attack) 01/26/2008  . Hypothyroidism 01/04/2008  . Abnormal Pap smear of vagina 09/24/2006    Current Outpatient Medications on File Prior to Visit  Medication Sig Dispense Refill  . Cholecalciferol (VITAMIN D) 125 MCG (5000 UT) CAPS Take 1 capsule by mouth daily.     Marland Kitchen levothyroxine (SYNTHROID) 88 MCG tablet TAKE 1 TABLET BY MOUTH DAILY. 90 tablet 0  . Pimavanserin Tartrate (NUPLAZID) 34 MG CAPS Take 1 capsule (34 mg total) by mouth daily. 30 capsule 0  . RYTARY 36.25-145 MG CPCR TAKE 2 TABLETS EACH A.M., 2 IN THE AFTERNOON AND 1 TABLET EACH EVENING. 150 capsule 1  . sertraline (ZOLOFT) 25 MG tablet Take 1 tablet (25 mg total) by mouth at bedtime. (Patient taking differently: Take 25 mg by mouth at bedtime. Taking 1/2 daily) 30 tablet 5  . traMADol (ULTRAM) 50 MG tablet Take 1 tablet (  50 mg total) by mouth every 6 (six) hours as needed. 30 tablet 0  . vitamin B-12 (CYANOCOBALAMIN) 1000 MCG tablet Take 1 tablet (1,000 mcg total) by mouth daily. 30 tablet 0   No current facility-administered medications on file prior to visit.    Past Medical History:  Diagnosis Date  . Abnormal Pap smear of vagina 09/2006   Dr Nori Riis  . Abnormality of gait  05/24/2015   PMH R occipital infarct B12 deficiency H/o polio affected left leg  In Physical Therapy to improve balance & strength through Noel Gerold  . Arthritis 12/25/2015  . Cervicalgia 01/17/2009   MRI 03/27/16:  IMPRESSION: 1. At C3-4 there is a mild broad-based disc bulge. Moderate bilateral facet arthropathy and uncovertebral degenerative changes resulting in moderate bilateral foraminal stenosis. 2. At C5-6 there is a broad-based disc osteophyte complex. Bilateral uncovertebral degenerative changes and facet arthropathy resulting in moderate bilateral foraminal stenosis.  . Fall 09/11/2017  . History of CVA (cerebrovascular accident) 07/20/2012   Chronic R occipital infarct ; stable on CT 05/21/2012 .Dr Jannifer Franklin , Neurology   . History of TIA (transient ischemic attack) 01/26/2008  . HYPERLIPIDEMIA 01/26/2008   NMR Lipoprofile 2009: LDL 135 (1704/911), HDL 62, TG 131. LDL goal = < 100; ideally < 70. MGF MI in 37s No FH CVA  . Hypothyroidism   . Intention tremor 12/08/2013   Onset after fall 04/2012   . Irritable bowel syndrome (IBS) 01/17/2009   Dr. Unice Cobble  . Osteopenia    PMH fracture heel, wrist  . Parkinson disease ([place]) 06/25/2016   Dr Tat  . Pernicious anemia   . Post-polio syndrome    minor problems L leg  . Restless leg syndrome 12/25/2015   Dr Tat  . Subdural hygroma 09/11/2017  . Swallowing disorder 03/04/2017  . Urinary incontinence 09/07/2008   Dr McDiarmid, Urology   . Vitamin B12 deficiency   . Weakness 06/24/2017    Past Surgical History:  Procedure Laterality Date  . BLADDER SUSPENSION  2000  . CATARACT EXTRACTION, BILATERAL     Dr. Gershon Crane  . no colonoscopy     "I never felt I needed one "  . YAG LASER APPLICATION Left    Dr. Gershon Crane    Social History   Socioeconomic History  . Marital status: Single    Spouse name: Not on file  . Number of children: 0  . Years of education: Not on file  . Highest education level: Some college, no degree  Occupational  History  . Not on file  Tobacco Use  . Smoking status: Never Smoker  . Smokeless tobacco: Never Used  Substance and Sexual Activity  . Alcohol use: Yes    Alcohol/week: 1.0 standard drinks    Types: 1 Glasses of wine per week    Comment: 0.6 oz per week  . Drug use: No  . Sexual activity: Not on file  Other Topics Concern  . Not on file  Social History Narrative   One level home - alone   R handed   Caffeine - cup of coffee every am / tea at night    Social Determinants of Health   Financial Resource Strain:   . Difficulty of Paying Living Expenses:   Food Insecurity:   . Worried About Charity fundraiser in the Last Year:   . Arboriculturist in the Last Year:   Transportation Needs:   . Film/video editor (Medical):   Marland Kitchen  Lack of Transportation (Non-Medical):   Physical Activity:   . Days of Exercise per Week:   . Minutes of Exercise per Session:   Stress:   . Feeling of Stress :   Social Connections:   . Frequency of Communication with Friends and Family:   . Frequency of Social Gatherings with Friends and Family:   . Attends Religious Services:   . Active Member of Clubs or Organizations:   . Attends Archivist Meetings:   Marland Kitchen Marital Status:     Family History  Problem Relation Age of Onset  . Heart attack Maternal Grandfather        70s  . Emphysema Mother   . Hypertension Mother   . Alzheimer's disease Father   . Breast cancer Maternal Grandmother   . Cancer Maternal Grandmother        breast  . Diabetes Neg Hx   . Stroke Neg Hx   . Parkinson's disease Neg Hx     Review of Systems     Objective:  There were no vitals filed for this visit. BP Readings from Last 3 Encounters:  04/17/20 (!) 142/77  02/14/20 (!) 164/82  02/03/20 (!) 138/57   Wt Readings from Last 3 Encounters:  03/15/20 80 lb (36.3 kg)  02/14/20 73 lb (33.1 kg)  01/31/20 74 lb 15.3 oz (34 kg)   There is no height or weight on file to calculate BMI.   Physical  Exam    Constitutional: Appears well-developed and well-nourished. No distress.  HENT:  Head: Normocephalic and atraumatic.  Neck: Neck supple. No tracheal deviation present. No thyromegaly present.  No cervical lymphadenopathy Cardiovascular: Normal rate, regular rhythm and normal heart sounds.   No murmur heard. No carotid bruit .  No edema Pulmonary/Chest: Effort normal and breath sounds normal. No respiratory distress. No has no wheezes. No rales.  Skin: Skin is warm and dry. Not diaphoretic.  Psychiatric: Normal mood and affect. Behavior is normal.      Assessment & Plan:    See Problem List for Assessment and Plan of chronic medical problems.    This visit occurred during the SARS-CoV-2 public health emergency.  Safety protocols were in place, including screening questions prior to the visit, additional usage of staff PPE, and extensive cleaning of exam room while observing appropriate contact time as indicated for disinfecting solutions.    This encounter was created in error - please disregard.

## 2020-04-19 NOTE — Telephone Encounter (Signed)
New message    What type of DME ([place]) would like your provider to order?  Wheelchair   Who would like to get the DME from? Open to suggestion   Last visit with PCP (>3 months requires and appointment for insurance to cover the cost = please schedule patient for visit to discuss medical necessity for DME)?  3.23.21

## 2020-04-19 NOTE — Telephone Encounter (Signed)
F/u   The patient returning call back to the [place].

## 2020-04-19 NOTE — Patient Instructions (Signed)
  Blood work was ordered.     Medications reviewed and updated.  Changes include :     Your prescription(s) have been submitted to your pharmacy. Please take as directed and contact our office if you believe you are having problem(s) with the medication(s).  A referral was ordered for        Someone will call you to schedule this.    Please followup in 6 months   

## 2020-04-20 ENCOUNTER — Encounter: Payer: Medicare Other | Admitting: Internal Medicine

## 2020-04-20 NOTE — Telephone Encounter (Signed)
Spoke with pt in regards  

## 2020-04-21 ENCOUNTER — Other Ambulatory Visit: Payer: Self-pay | Admitting: Neurology

## 2020-04-21 ENCOUNTER — Other Ambulatory Visit: Payer: Self-pay | Admitting: Internal Medicine

## 2020-04-24 ENCOUNTER — Other Ambulatory Visit: Payer: Self-pay | Admitting: Internal Medicine

## 2020-04-24 DIAGNOSIS — S52509A Unspecified fracture of the lower end of unspecified radius, initial encounter for closed fracture: Secondary | ICD-10-CM | POA: Insufficient documentation

## 2020-04-25 ENCOUNTER — Telehealth: Payer: Self-pay | Admitting: Internal Medicine

## 2020-04-25 NOTE — Telephone Encounter (Signed)
   Bonnee Quin calling on behalf of the patient to request order for rehab at [facility]. Scheduler advised Ms. Graves she is not listed on DPR, she would need to have responsible party contact office to discuss

## 2020-04-25 NOTE — Telephone Encounter (Signed)
F/u    Elizabeth [name] - on the DPR date 10/20   Send a referral to [facility] in [place]   Reason broken wrist   Asking for a call back

## 2020-04-26 NOTE — Telephone Encounter (Signed)
Advised pt to contact orthopedics for a referral.

## 2020-04-30 ENCOUNTER — Telehealth: Payer: Self-pay

## 2020-04-30 NOTE — Telephone Encounter (Signed)
New message    POA Rihana Kiddy is asking can she get into [facility] again S/p recent fall.  POA has not contacted the insurance company nor [facility].   Advise patient to contact the insurance company to see if they will pay and contacted [facility] to see if there any opening beds available.   Patient verbalized understanding will call back

## 2020-05-01 ENCOUNTER — Ambulatory Visit: Payer: Medicare Other | Admitting: Orthopaedic Surgery

## 2020-05-01 ENCOUNTER — Other Ambulatory Visit: Payer: Self-pay

## 2020-05-01 ENCOUNTER — Telehealth: Payer: Self-pay | Admitting: Neurology

## 2020-05-01 ENCOUNTER — Ambulatory Visit (INDEPENDENT_AMBULATORY_CARE_PROVIDER_SITE_OTHER): Payer: Medicare Other

## 2020-05-01 ENCOUNTER — Encounter: Payer: Self-pay | Admitting: Orthopaedic Surgery

## 2020-05-01 VITALS — Ht 60.0 in | Wt 80.0 lb

## 2020-05-01 DIAGNOSIS — S52602A Unspecified fracture of lower end of left ulna, initial encounter for closed fracture: Secondary | ICD-10-CM | POA: Diagnosis not present

## 2020-05-01 DIAGNOSIS — S52502A Unspecified fracture of the lower end of left radius, initial encounter for closed fracture: Secondary | ICD-10-CM | POA: Diagnosis not present

## 2020-05-01 DIAGNOSIS — R29898 Other symptoms and signs involving the musculoskeletal system: Secondary | ICD-10-CM

## 2020-05-01 MED ORDER — RYTARY 36.25-145 MG PO CPCR: ORAL_CAPSULE | ORAL | 1 refills | Status: DC

## 2020-05-01 NOTE — Telephone Encounter (Signed)
Sent refill into pharmacy

## 2020-05-01 NOTE — Progress Notes (Signed)
Post-Op Visit Note   Patient: Elizabeth [name]           Date of Birth: [birth year]           MRN: [id number] Visit Date: 05/01/2020 PCP: Binnie Rail, MD   Assessment & Plan: Post reduction distal radius and ulna left.  Sugar tong removed x-rays demonstrate maintained position but she does have significant shortening with impaction.  Short arm fiberglass cast applied.  She has had problems walking has previous compression fractures and has to be held up when she walks.  We will set up physical therapy at [place] outpatient which is convenient for her for lower extremity strengthening with history of falls and fractures.  Chief Complaint:  Chief Complaint  Patient presents with  . Left Wrist - Fracture, Follow-up    Fall 04/17/2020   Visit Diagnoses:  1. Closed fracture of distal ends of left radius and ulna, initial encounter     Plan: Return 4 weeks for cast off and x-rays out of cast.  Follow-Up Instructions: Return in about 4 weeks (around 05/29/2020).   Orders:  Orders Placed This Encounter  Procedures  . XR Wrist Complete Left   No orders of the defined types were placed in this encounter.   Imaging: XR Wrist Complete Left  Result Date: 05/01/2020 Three-view x-rays left wrist obtained and reviewed.  This shows impacted distal radius and ulnar fracture post reduction maintained in the sugar tong splint.  There is slight angulation and significant impaction of the distal radius.  Position compared to postreduction x-rays has been maintained. Impression: Left distal radius and ulnar fracture post reduction with significant impaction and shortening.   PMFS History: Patient Active Problem List   Diagnosis Date Noted  . Radius and ulna distal fracture 04/24/2020  . Sepsis secondary to UTI ([place]) 01/31/2020  . Hypokalemia 01/31/2020  . Hyponatremia 01/31/2020  . Constipation 01/22/2019  . Malnutrition of moderate degree 12/17/2018  . Pars defect of lumbar spine 12/16/2018    . Compression fracture of L3 vertebra ([place]) 11/02/2018  . Right shoulder pain 11/02/2018  . Left groin pain 10/19/2018  . Low back pain 10/19/2018  . Dysuria 10/12/2018  . Osteoporosis 05/30/2018  . Poor posture 04/28/2018  . Physical deconditioning 04/28/2018  . Anxiety 03/02/2018  . Foot pain, bilateral 12/21/2017  . Bilateral leg edema 12/02/2017  . Overactive bladder 09/19/2017  . Post-polio syndrome   . Vitamin B12 deficiency   . Fall 09/11/2017  . Subdural hygroma 09/11/2017  . Swallowing disorder 03/04/2017  . Parkinson disease ([place]) 06/25/2016  . Restless leg syndrome 12/25/2015  . Arthritis 12/25/2015  . Abnormality of gait 05/24/2015  . History of CVA (cerebrovascular accident) 07/20/2012  . Cervicalgia 01/17/2009  . Irritable bowel syndrome (IBS) 01/17/2009  . Urinary incontinence 09/07/2008  . Normocytic anemia 04/26/2008  . HYPERLIPIDEMIA 01/26/2008  . History of TIA (transient ischemic attack) 01/26/2008  . Hypothyroidism 01/04/2008  . Abnormal Pap smear of vagina 09/24/2006   Past Medical History:  Diagnosis Date  . Abnormal Pap smear of vagina 09/2006   Dr Nori Riis  . Abnormality of gait 05/24/2015   PMH R occipital infarct B12 deficiency H/o polio affected left leg  In Physical Therapy to improve balance & strength through Noel Gerold  . Arthritis 12/25/2015  . Cervicalgia 01/17/2009   MRI 03/27/16:  IMPRESSION: 1. At C3-4 there is a mild broad-based disc bulge. Moderate bilateral facet arthropathy and uncovertebral degenerative changes resulting in moderate bilateral  foraminal stenosis. 2. At C5-6 there is a broad-based disc osteophyte complex. Bilateral uncovertebral degenerative changes and facet arthropathy resulting in moderate bilateral foraminal stenosis.  . Fall 09/11/2017  . History of CVA (cerebrovascular accident) 07/20/2012   Chronic R occipital infarct ; stable on CT 05/21/2012 .Dr Jannifer Franklin , Neurology   . History of TIA (transient ischemic attack)  01/26/2008  . HYPERLIPIDEMIA 01/26/2008   NMR Lipoprofile 2009: LDL 135 (1704/911), HDL 62, TG 131. LDL goal = < 100; ideally < 70. MGF MI in 79s No FH CVA  . Hypothyroidism   . Intention tremor 12/08/2013   Onset after fall 04/2012   . Irritable bowel syndrome (IBS) 01/17/2009   Dr. Unice Cobble  . Osteopenia    PMH fracture heel, wrist  . Parkinson disease ([place]) 06/25/2016   Dr Tat  . Pernicious anemia   . Post-polio syndrome    minor problems L leg  . Restless leg syndrome 12/25/2015   Dr Tat  . Subdural hygroma 09/11/2017  . Swallowing disorder 03/04/2017  . Urinary incontinence 09/07/2008   Dr McDiarmid, Urology   . Vitamin B12 deficiency   . Weakness 06/24/2017    Family History  Problem Relation Age of Onset  . Heart attack Maternal Grandfather        70s  . Emphysema Mother   . Hypertension Mother   . Alzheimer's disease Father   . Breast cancer Maternal Grandmother   . Cancer Maternal Grandmother        breast  . Diabetes Neg Hx   . Stroke Neg Hx   . Parkinson's disease Neg Hx     Past Surgical History:  Procedure Laterality Date  . BLADDER SUSPENSION  2000  . CATARACT EXTRACTION, BILATERAL     Dr. Gershon Crane  . no colonoscopy     "I never felt I needed one "  . YAG LASER APPLICATION Left    Dr. Gershon Crane   Social History   Occupational History  . Not on file  Tobacco Use  . Smoking status: Never Smoker  . Smokeless tobacco: Never Used  Substance and Sexual Activity  . Alcohol use: Yes    Alcohol/week: 1.0 standard drinks    Types: 1 Glasses of wine per week    Comment: 0.6 oz per week  . Drug use: No  . Sexual activity: Not on file

## 2020-05-01 NOTE — Telephone Encounter (Signed)
Family called in stating the patient needs a refill on parkinsons medication. Will run out tomorrow.

## 2020-05-04 NOTE — Addendum Note (Signed)
Addended by: Meyer Cory on: 05/04/2020 08:47 AM   Modules accepted: Orders

## 2020-05-07 ENCOUNTER — Encounter (HOSPITAL_COMMUNITY): Payer: Self-pay

## 2020-05-07 ENCOUNTER — Emergency Department (HOSPITAL_COMMUNITY)
Admission: EM | Admit: 2020-05-07 | Discharge: 2020-05-07 | Discharge status: Against Medical Advice | Payer: Medicare Other | Attending: Emergency Medicine | Admitting: Emergency Medicine

## 2020-05-07 ENCOUNTER — Other Ambulatory Visit: Payer: Self-pay

## 2020-05-07 DIAGNOSIS — R350 Frequency of micturition: Secondary | ICD-10-CM | POA: Insufficient documentation

## 2020-05-07 LAB — URINALYSIS, ROUTINE W REFLEX MICROSCOPIC
Bilirubin Urine: NEGATIVE
Glucose, UA: NEGATIVE mg/dL
Ketones, ur: 5 mg/dL — AB
Nitrite: NEGATIVE
Protein, ur: 30 mg/dL — AB
Specific Gravity, Urine: 1.015 (ref 1.005–1.030)
pH: 5 (ref 5.0–8.0)

## 2020-05-07 NOTE — ED Triage Notes (Signed)
Patient c/o urinary frequency, dark urine, foul odor and feeling weak. Patient's caretaker reports a UTI approx one month ago.

## 2020-05-07 NOTE — ED Notes (Signed)
Pt called ! No answer x3!

## 2020-05-07 NOTE — ED Notes (Signed)
Called pt from lobby No answer

## 2020-05-07 NOTE — ED Notes (Signed)
Labeled urine specimen cup and culture sent to lab. [employer]

## 2020-05-08 ENCOUNTER — Ambulatory Visit: Payer: Medicare Other | Admitting: Orthopaedic Surgery

## 2020-05-09 NOTE — Telephone Encounter (Signed)
F/u  The patient voiced   Facility : [facility] # [phone]   Reason : Physical Therapy into her home

## 2020-05-10 ENCOUNTER — Telehealth: Payer: Self-pay | Admitting: Orthopaedic Surgery

## 2020-05-10 ENCOUNTER — Other Ambulatory Visit: Payer: Self-pay | Admitting: Internal Medicine

## 2020-05-10 ENCOUNTER — Telehealth: Payer: Self-pay

## 2020-05-10 DIAGNOSIS — R29898 Other symptoms and signs involving the musculoskeletal system: Secondary | ICD-10-CM

## 2020-05-10 DIAGNOSIS — N3000 Acute cystitis without hematuria: Secondary | ICD-10-CM

## 2020-05-10 MED ORDER — NITROFURANTOIN MONOHYD MACRO 100 MG PO CAPS: 100.0000 mg | ORAL_CAPSULE | Freq: Two times a day (BID) | ORAL | 0 refills | Status: AC

## 2020-05-10 NOTE — Telephone Encounter (Signed)
Patient called.   She wants her referral to [facility] sent to [facility] instead. She also asking that a copy of that note be sent to her PCP Dr.Burns.   Call back: [phone]

## 2020-05-10 NOTE — Telephone Encounter (Signed)
New message    The patient calling C/o UTI offers an appt to see another MD / NP since PCP is on vacation patient refuse  The patient verbalized Dr. Quay Burow has called in her medication before without an appt asking for the same medication to be called in.   [place], [place]

## 2020-05-10 NOTE — Telephone Encounter (Signed)
Per Dr. Lorin Mercy, patient is not home bound and insurance will not cover. I tried to call patient with no answer.

## 2020-05-10 NOTE — Telephone Encounter (Signed)
Info given. Patient will need to contact Dr. Lorin Mercy office to request PT as he is currently treating her for her fracture.

## 2020-05-10 NOTE — Telephone Encounter (Signed)
Called and left message for patient today that RX had been sent in.

## 2020-05-11 NOTE — Telephone Encounter (Signed)
New referral entered 

## 2020-05-11 NOTE — Addendum Note (Signed)
Addended by: Meyer Cory on: 05/11/2020 09:55 AM   Modules accepted: Orders

## 2020-05-11 NOTE — Telephone Encounter (Signed)
I called patient. She [name] to [place]. She would like to come to our office for PT. I advised I would put in order and someone from PT would call to schedule.. Patient expressed understanding.

## 2020-05-17 ENCOUNTER — Telehealth: Payer: Self-pay

## 2020-05-17 NOTE — Telephone Encounter (Deleted)
Error

## 2020-05-22 ENCOUNTER — Ambulatory Visit: Payer: Medicare Other | Admitting: Physical Therapy

## 2020-05-22 DIAGNOSIS — G479 Sleep disorder, unspecified: Secondary | ICD-10-CM | POA: Insufficient documentation

## 2020-05-22 NOTE — Progress Notes (Deleted)
Virtual Visit via Video Note  I connected with Elizabeth [name] on 05/22/20 at  3:30 PM EDT by a video enabled telemedicine application and verified that I am speaking with the correct person using two identifiers.   I discussed the limitations of evaluation and management by telemedicine and the availability of in person appointments. The patient expressed understanding and agreed to proceed.  Present for the visit:  Myself, Dr Billey Gosling, [name] and her neighbor and friend, [name].  The patient is currently at home and I am in the office.    No referring provider.    History of Present Illness: She is here for follow up of her chronic medical conditions.     Bladder issues:   Difficulty sleeping at night:   Hypothyroidism:  She is taking her medication daily.  She denies any recent changes in energy or weight that are unexplained.     ROS    Social History   Socioeconomic History  . Marital status: Single    Spouse name: Not on file  . Number of children: 0  . Years of education: Not on file  . Highest education level: Some college, no degree  Occupational History  . Not on file  Tobacco Use  . Smoking status: Never Smoker  . Smokeless tobacco: Never Used  Vaping Use  . Vaping Use: Never used  Substance and Sexual Activity  . Alcohol use: Yes    Alcohol/week: 1.0 standard drink    Types: 1 Glasses of wine per week    Comment: 0.6 oz per week  . Drug use: No  . Sexual activity: Not on file  Other Topics Concern  . Not on file  Social History Narrative   One level home - alone   R handed   Caffeine - cup of coffee every am / tea at night    Social Determinants of Health   Financial Resource Strain:   . Difficulty of Paying Living Expenses:   Food Insecurity:   . Worried About Charity fundraiser in the Last Year:   . Arboriculturist in the Last Year:   Transportation Needs:   . Film/video editor (Medical):   Marland Kitchen Lack of Transportation (Non-Medical):    Physical Activity:   . Days of Exercise per Week:   . Minutes of Exercise per Session:   Stress:   . Feeling of Stress :   Social Connections:   . Frequency of Communication with Friends and Family:   . Frequency of Social Gatherings with Friends and Family:   . Attends Religious Services:   . Active Member of Clubs or Organizations:   . Attends Archivist Meetings:   Marland Kitchen Marital Status:      Observations/Objective: Appears well in NAD   Assessment and Plan:  See Problem List for Assessment and Plan of chronic medical problems.   Follow Up Instructions:    I discussed the assessment and treatment plan with the patient. The patient was provided an opportunity to ask questions and all were answered. The patient agreed with the plan and demonstrated an understanding of the instructions.   The patient was advised to call back or seek an in-person evaluation if the symptoms worsen or if the condition fails to improve as anticipated.    Binnie Rail, MD

## 2020-05-23 ENCOUNTER — Telehealth: Payer: Medicare Other | Admitting: Internal Medicine

## 2020-05-25 ENCOUNTER — Telehealth: Payer: Medicare Other | Admitting: Internal Medicine

## 2020-05-29 ENCOUNTER — Ambulatory Visit (INDEPENDENT_AMBULATORY_CARE_PROVIDER_SITE_OTHER): Payer: Medicare Other

## 2020-05-29 ENCOUNTER — Encounter: Payer: Self-pay | Admitting: Orthopaedic Surgery

## 2020-05-29 ENCOUNTER — Other Ambulatory Visit: Payer: Self-pay

## 2020-05-29 ENCOUNTER — Ambulatory Visit (INDEPENDENT_AMBULATORY_CARE_PROVIDER_SITE_OTHER): Payer: Medicare Other | Admitting: Orthopaedic Surgery

## 2020-05-29 VITALS — Ht 60.0 in | Wt 80.0 lb

## 2020-05-29 DIAGNOSIS — S52602A Unspecified fracture of lower end of left ulna, initial encounter for closed fracture: Secondary | ICD-10-CM

## 2020-05-29 DIAGNOSIS — S52502A Unspecified fracture of the lower end of left radius, initial encounter for closed fracture: Secondary | ICD-10-CM | POA: Diagnosis not present

## 2020-05-29 NOTE — Progress Notes (Signed)
Office Visit Note   Patient: Elizabeth [name]           Date of Birth: [birth year]           MRN: [id number] Visit Date: 05/29/2020              Requested by: Binnie Rail, MD [place],  [place] [address] PCP: Binnie Rail, MD   Assessment & Plan: Visit Diagnoses:  1. Closed fracture of distal ends of left radius and ulna, initial encounter     Plan: We discussed always using the walker when she gets up to avoid further falls and fractures.  Respond applied she is not released 3 weeks and can remove it for washing her hand and bathing.  She can return if needed.  She is happy that she avoided surgery and states she is able to move her wrist and currently and has minimal discomfort.  Follow-Up Instructions: No follow-ups on file.   Orders:  Orders Placed This Encounter  Procedures   XR Wrist 2 Views Left   No orders of the defined types were placed in this encounter.     Procedures: No procedures performed   Clinical Data: No additional findings.   Subjective: Chief Complaint  Patient presents with   Left Wrist - Fracture, Follow-up    Fall 04/17/2020    HPI 86 year old female severe osteopenia returns post fall with distal radius fracture.  Today out of cast she has some range of motion of her wrist without significant pain mild tenderness at the fracture site.  Review of Systems updated and unchanged.   Objective: Vital Signs: Ht 5' (1.524 m)    Wt 80 lb (36.3 kg)    BMI 15.62 kg/m   Physical Exam Constitutional:      Appearance: She is well-developed.  HENT:     Head: Normocephalic.     Right Ear: External ear normal.     Left Ear: External ear normal.  Eyes:     Pupils: Pupils are equal, round, and reactive to light.  Neck:     Thyroid: No thyromegaly.     Trachea: No tracheal deviation.  Cardiovascular:     Rate and Rhythm: Normal rate.  Pulmonary:     Effort: Pulmonary effort is normal.  Abdominal:     Palpations: Abdomen is  soft.  Skin:    General: Skin is warm and dry.  Neurological:     Mental Status: She is alert and oriented to person, place, and time.  Psychiatric:        Behavior: Behavior normal.     Ortho Exam median and ulnar sensation the digits are intact.  Slight tenderness at the distal radius.  Prominence of the ulnar styloid left wrist.  Specialty Comments:  No specialty comments available.  Imaging: No results found.   PMFS History: Patient Active Problem List   Diagnosis Date Noted   Sleep difficulties 05/22/2020   Radius and ulna distal fracture 04/24/2020   Sepsis secondary to UTI ([place]) 01/31/2020   Constipation 01/22/2019   Malnutrition of moderate degree 12/17/2018   Pars defect of lumbar spine 12/16/2018   Compression fracture of L3 vertebra (HCC) 11/02/2018   Right shoulder pain 11/02/2018   Left groin pain 10/19/2018   Low back pain 10/19/2018   Osteoporosis 05/30/2018   Poor posture 04/28/2018   Physical deconditioning 04/28/2018   Anxiety 03/02/2018   Foot pain, bilateral 12/21/2017   Bilateral leg edema 12/02/2017  Overactive bladder 09/19/2017   Post-polio syndrome    Vitamin B12 deficiency    Fall 09/11/2017   Subdural hygroma 09/11/2017   Swallowing disorder 03/04/2017   Parkinson disease ([place]) 06/25/2016   Restless leg syndrome 12/25/2015   Arthritis 12/25/2015   Abnormality of gait 05/24/2015   History of CVA (cerebrovascular accident) 07/20/2012   Cervicalgia 01/17/2009   Irritable bowel syndrome (IBS) 01/17/2009   Urinary incontinence 09/07/2008   Normocytic anemia 04/26/2008   HYPERLIPIDEMIA 01/26/2008   History of TIA (transient ischemic attack) 01/26/2008   Hypothyroidism 01/04/2008   Past Medical History:  Diagnosis Date   Abnormal Pap smear of vagina 09/2006   Dr Nori Riis   Abnormality of gait 05/24/2015   PMH R occipital infarct B12 deficiency H/o polio affected left leg  In Physical Therapy to improve  balance & strength through Noel Gerold   Arthritis 12/25/2015   Cervicalgia 01/17/2009   MRI 03/27/16:  IMPRESSION: 1. At C3-4 there is a mild broad-based disc bulge. Moderate bilateral facet arthropathy and uncovertebral degenerative changes resulting in moderate bilateral foraminal stenosis. 2. At C5-6 there is a broad-based disc osteophyte complex. Bilateral uncovertebral degenerative changes and facet arthropathy resulting in moderate bilateral foraminal stenosis.   Fall 09/11/2017   History of CVA (cerebrovascular accident) 07/20/2012   Chronic R occipital infarct ; stable on CT 05/21/2012 .Dr Jannifer Franklin , Neurology    History of TIA (transient ischemic attack) 01/26/2008   HYPERLIPIDEMIA 01/26/2008   NMR Lipoprofile 2009: LDL 135 (1704/911), HDL 62, TG 131. LDL goal = < 100; ideally < 70. MGF MI in 63s No FH CVA   Hypothyroidism    Intention tremor 12/08/2013   Onset after fall 04/2012    Irritable bowel syndrome (IBS) 01/17/2009   Dr. Unice Cobble   Osteopenia    PMH fracture heel, wrist   Parkinson disease ([place]) 06/25/2016   Dr Tat   Pernicious anemia    Post-polio syndrome    minor problems L leg   Restless leg syndrome 12/25/2015   Dr Tat   Subdural hygroma 09/11/2017   Swallowing disorder 03/04/2017   Urinary incontinence 09/07/2008   Dr McDiarmid, Urology    Vitamin B12 deficiency    Weakness 06/24/2017    Family History  Problem Relation Age of Onset   Heart attack Maternal Grandfather        40s   Emphysema Mother    Hypertension Mother    Alzheimer's disease Father    Breast cancer Maternal Grandmother    Cancer Maternal Grandmother        breast   Diabetes Neg Hx    Stroke Neg Hx    Parkinson's disease Neg Hx     Past Surgical History:  Procedure Laterality Date   BLADDER SUSPENSION  2000   CATARACT EXTRACTION, BILATERAL     Dr. Gershon Crane   no colonoscopy     "I never felt I needed one "   YAG LASER APPLICATION Left    Dr. Gershon Crane    Social History   Occupational History   Not on file  Tobacco Use   Smoking status: Never Smoker   Smokeless tobacco: Never Used  Vaping Use   Vaping Use: Never used  Substance and Sexual Activity   Alcohol use: Yes    Alcohol/week: 1.0 standard drink    Types: 1 Glasses of wine per week    Comment: 0.6 oz per week   Drug use: No   Sexual activity: Not on file

## 2020-05-30 ENCOUNTER — Ambulatory Visit: Payer: Medicare Other | Admitting: Physical Therapy

## 2020-06-04 ENCOUNTER — Ambulatory Visit: Payer: Medicare Other | Admitting: Physical Therapy

## 2020-06-12 ENCOUNTER — Other Ambulatory Visit: Payer: Self-pay | Admitting: Internal Medicine

## 2020-06-12 IMAGING — DX DG LUMBAR SPINE COMPLETE 4+V
5 series · 5 of 5 positions shown · non-contrast
Comparison: None.

CLINICAL DATA: Low back pain after fall last month.

EXAM:
LUMBAR SPINE - COMPLETE 4+ VIEW

[l-spine ap]
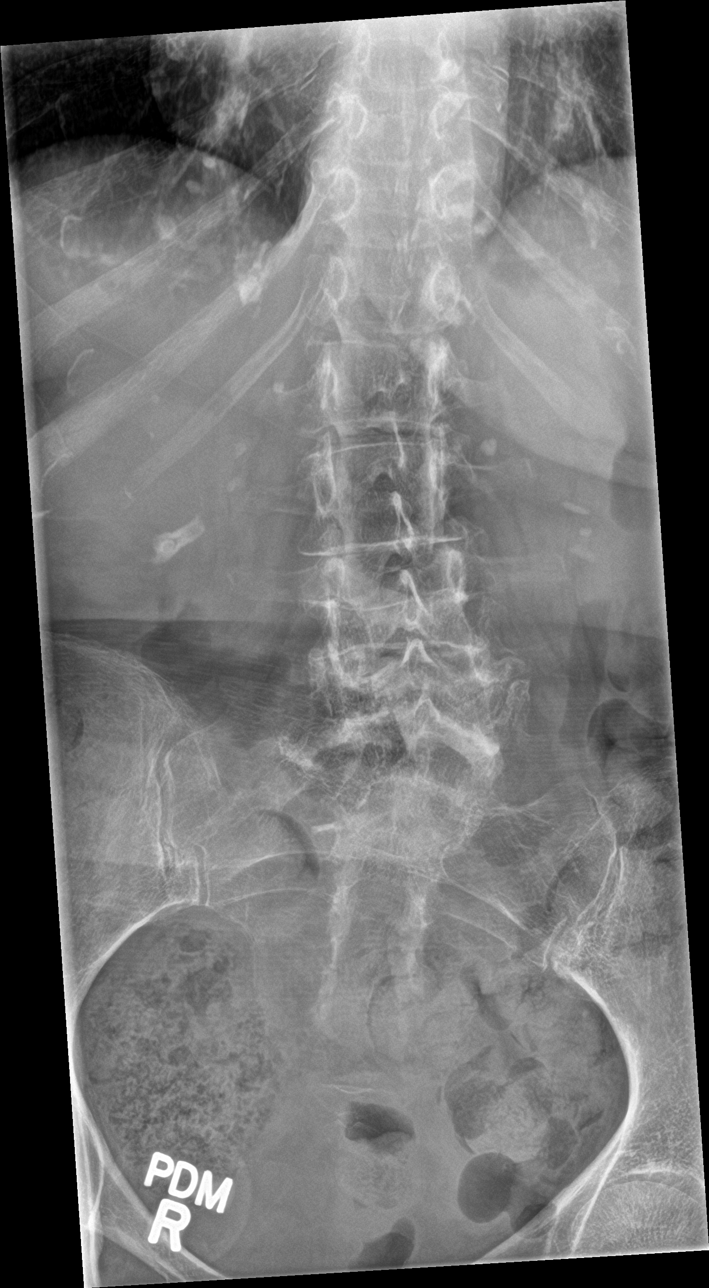

[l-spine obl (1 of 2)]
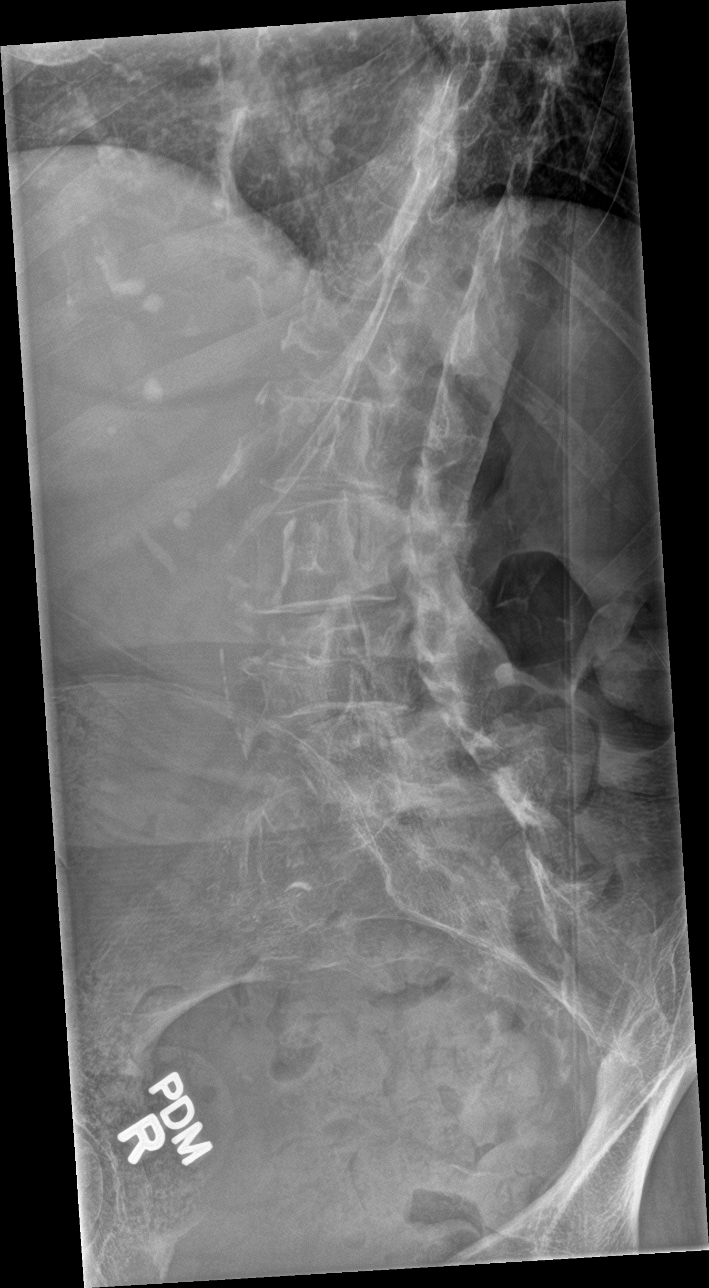

[l-spine obl (2 of 2)]
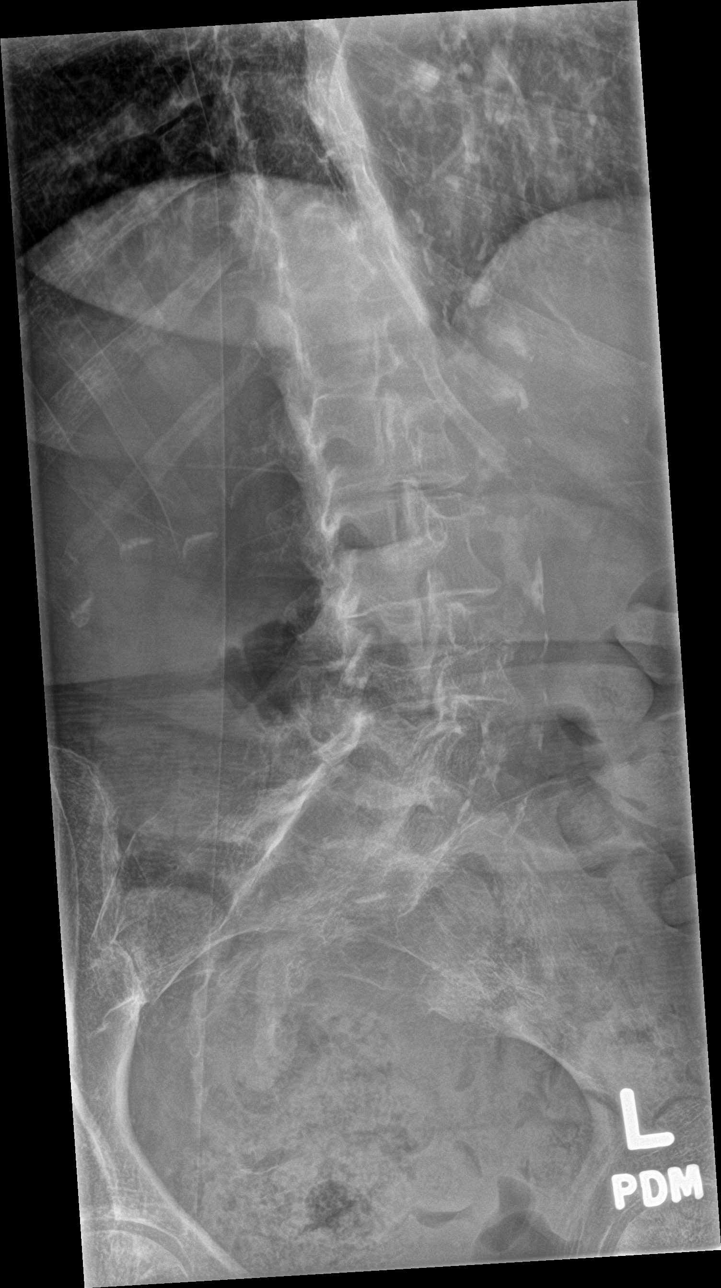

[l-spine lat]
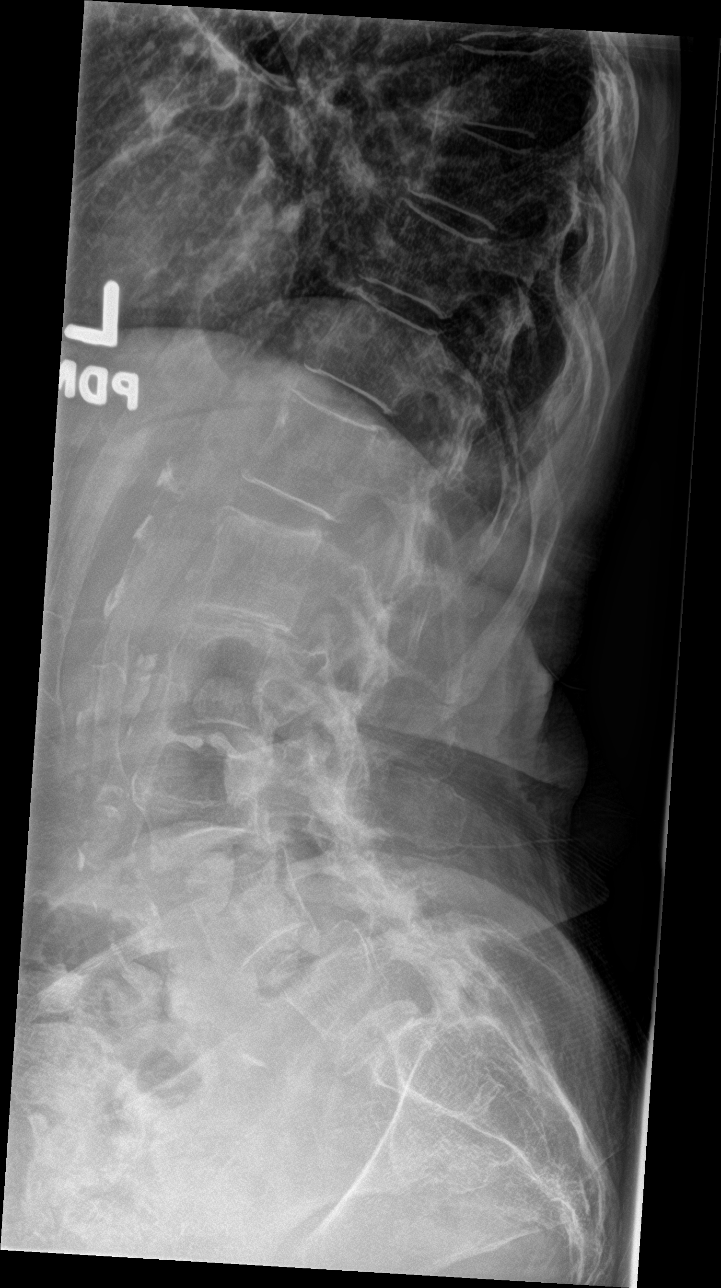

[l-spine spot]
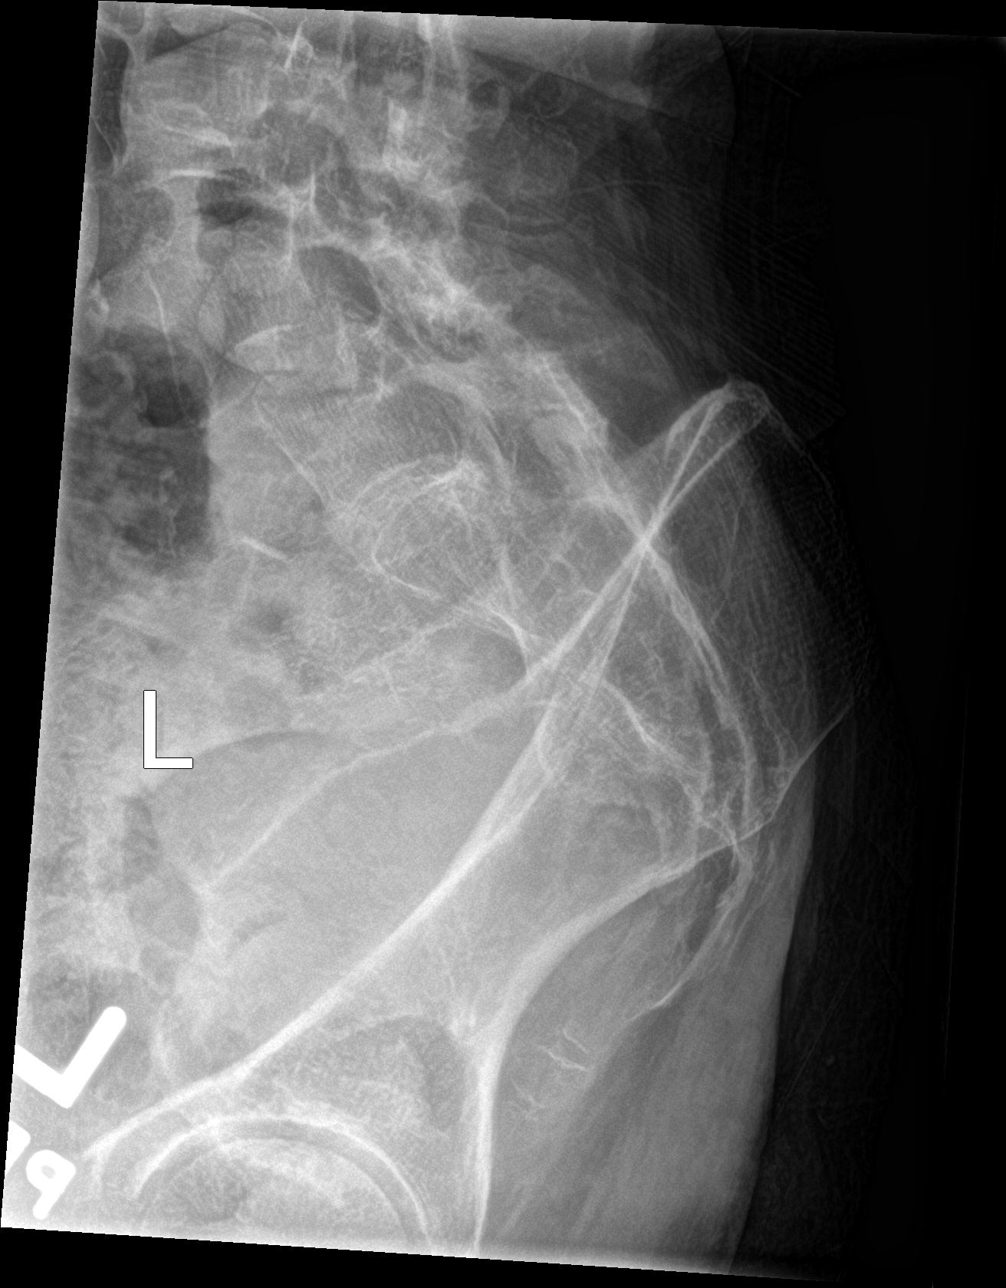

[5 of 5 positions shown; findings below may reference images not displayed]

FINDINGS: Moderate compression deformity of L3 vertebral body is noted
concerning for fracture of indeterminate age. Grade 1
anterolisthesis is noted at L3-4 and L4-5 which may be due to
posterior facet joint hypertrophy. Atherosclerosis of abdominal
aorta is noted. Moderate degenerative disc disease is noted at L1-2.
IMPRESSION: Moderate compression deformity of L3 vertebral body is noted
concerning for fracture of indeterminate age; MRI is recommended for
further evaluation.

Also noted is moderate degenerative disc disease at L1-2, with grade
1 anterolisthesis of L3-4 and L4-5 most likely due to posterior
facet joint hypertrophy.

Aortic Atherosclerosis ([id number]-W66.6).

## 2020-06-18 ENCOUNTER — Other Ambulatory Visit: Payer: Self-pay

## 2020-06-18 ENCOUNTER — Ambulatory Visit: Payer: Medicare Other | Admitting: Family

## 2020-06-18 ENCOUNTER — Encounter: Payer: Self-pay | Admitting: Family

## 2020-06-18 VITALS — BP 132/82 | HR 80 | Temp 97.8°F | Wt <= 1120 oz

## 2020-06-18 DIAGNOSIS — R350 Frequency of micturition: Secondary | ICD-10-CM | POA: Diagnosis not present

## 2020-06-18 LAB — POCT URINALYSIS DIPSTICK
Bilirubin, UA: NEGATIVE
Blood, UA: NEGATIVE
Glucose, UA: NEGATIVE
Ketones, UA: 15
Nitrite, UA: NEGATIVE
Odor: POSITIVE
Protein, UA: NEGATIVE
Spec Grav, UA: 1.02 (ref 1.010–1.025)
Urobilinogen, UA: 0.2 E.U./dL
pH, UA: 6 (ref 5.0–8.0)

## 2020-06-18 MED ORDER — CEFUROXIME AXETIL 250 MG PO TABS: 250.0000 mg | ORAL_TABLET | Freq: Two times a day (BID) | ORAL | 0 refills | Status: DC

## 2020-06-18 NOTE — Addendum Note (Signed)
Addended by: Steward Ros on: 06/18/2020 03:46 PM   Modules accepted: Orders

## 2020-06-18 NOTE — Progress Notes (Signed)
Elizabeth [name] is a 86 y.o. female with the following history as recorded in EpicCare:  Patient Active Problem List   Diagnosis Date Noted   Sleep difficulties 05/22/2020   Radius and ulna distal fracture 04/24/2020   Sepsis secondary to UTI ([place]) 01/31/2020   Constipation 01/22/2019   Malnutrition of moderate degree 12/17/2018   Pars defect of lumbar spine 12/16/2018   Compression fracture of L3 vertebra (HCC) 11/02/2018   Right shoulder pain 11/02/2018   Left groin pain 10/19/2018   Low back pain 10/19/2018   Osteoporosis 05/30/2018   Poor posture 04/28/2018   Physical deconditioning 04/28/2018   Anxiety 03/02/2018   Foot pain, bilateral 12/21/2017   Bilateral leg edema 12/02/2017   Overactive bladder 09/19/2017   Post-polio syndrome    Vitamin B12 deficiency    Fall 09/11/2017   Subdural hygroma 09/11/2017   Swallowing disorder 03/04/2017   Parkinson disease ([place]) 06/25/2016   Restless leg syndrome 12/25/2015   Arthritis 12/25/2015   Abnormality of gait 05/24/2015   History of CVA (cerebrovascular accident) 07/20/2012   Cervicalgia 01/17/2009   Irritable bowel syndrome (IBS) 01/17/2009   Urinary incontinence 09/07/2008   Normocytic anemia 04/26/2008   HYPERLIPIDEMIA 01/26/2008   History of TIA (transient ischemic attack) 01/26/2008   Hypothyroidism 01/04/2008    Current Outpatient Medications  Medication Sig Dispense Refill   Carbidopa-Levodopa ER (RYTARY) 36.25-145 MG CPCR TAKE 2 TABLETS EACH A.M., 2 IN THE AFTERNOON AND 1 TABLET EACH EVENING. 150 capsule 1   Cholecalciferol (VITAMIN D) 125 MCG (5000 UT) CAPS Take 1 capsule by mouth daily.      levothyroxine (SYNTHROID) 88 MCG tablet TAKE 1 TABLET BY MOUTH DAILY. 90 tablet 0   Pimavanserin Tartrate (NUPLAZID) 34 MG CAPS Take 1 capsule (34 mg total) by mouth daily. 30 capsule 0   sertraline (ZOLOFT) 25 MG tablet TAKE ONE TABLET AT BEDTIME. 30 tablet 0   traMADol (ULTRAM) 50 MG  tablet Take 1 tablet (50 mg total) by mouth every 6 (six) hours as needed. 30 tablet 0   vitamin B-12 (CYANOCOBALAMIN) 1000 MCG tablet Take 1 tablet (1,000 mcg total) by mouth daily. 30 tablet 0   cefUROXime (CEFTIN) 250 MG tablet Take 1 tablet (250 mg total) by mouth 2 (two) times daily with a meal. 10 tablet 0   No current facility-administered medications for this visit.    Allergies: Atorvastatin, Crestor [rosuvastatin calcium], and Vesicare [solifenacin]  Past Medical History:  Diagnosis Date   Abnormal Pap smear of vagina 09/2006   Dr Nori Riis   Abnormality of gait 05/24/2015   PMH R occipital infarct B12 deficiency H/o polio affected left leg  In Physical Therapy to improve balance & strength through Noel Gerold   Arthritis 12/25/2015   Cervicalgia 01/17/2009   MRI 03/27/16:  IMPRESSION: 1. At C3-4 there is a mild broad-based disc bulge. Moderate bilateral facet arthropathy and uncovertebral degenerative changes resulting in moderate bilateral foraminal stenosis. 2. At C5-6 there is a broad-based disc osteophyte complex. Bilateral uncovertebral degenerative changes and facet arthropathy resulting in moderate bilateral foraminal stenosis.   Fall 09/11/2017   History of CVA (cerebrovascular accident) 07/20/2012   Chronic R occipital infarct ; stable on CT 05/21/2012 .Dr Jannifer Franklin , Neurology    History of TIA (transient ischemic attack) 01/26/2008   HYPERLIPIDEMIA 01/26/2008   NMR Lipoprofile 2009: LDL 135 (1704/911), HDL 62, TG 131. LDL goal = < 100; ideally < 70. MGF MI in 69s No FH CVA   Hypothyroidism  Intention tremor 12/08/2013   Onset after fall 04/2012    Irritable bowel syndrome (IBS) 01/17/2009   Dr. Unice Cobble   Osteopenia    PMH fracture heel, wrist   Parkinson disease ([place]) 06/25/2016   Dr Tat   Pernicious anemia    Post-polio syndrome    minor problems L leg   Restless leg syndrome 12/25/2015   Dr Tat   Subdural hygroma 09/11/2017   Swallowing disorder  03/04/2017   Urinary incontinence 09/07/2008   Dr McDiarmid, Urology    Vitamin B12 deficiency    Weakness 06/24/2017    Past Surgical History:  Procedure Laterality Date   BLADDER SUSPENSION  2000   CATARACT EXTRACTION, BILATERAL     Dr. Gershon Crane   no colonoscopy     "I never felt I needed one "   YAG LASER APPLICATION Left    Dr. Gershon Crane    Family History  Problem Relation Age of Onset   Heart attack Maternal Grandfather        110s   Emphysema Mother    Hypertension Mother    Alzheimer's disease Father    Breast cancer Maternal Grandmother    Cancer Maternal Grandmother        breast   Diabetes Neg Hx    Stroke Neg Hx    Parkinson's disease Neg Hx     Social History   Tobacco Use   Smoking status: Never Smoker   Smokeless tobacco: Never Used  Substance Use Topics   Alcohol use: Yes    Alcohol/week: 1.0 standard drink    Types: 1 Glasses of wine per week    Comment: 0.6 oz per week    Subjective:  Accompanied by her caregiver today; signs and symptoms of UTI x 3-4 days; + frequency; +strong odor; no burning with urination or blood noted in the urine; no fever noted;    Also chronic issues with insomnia- not a new issue and has appt with her PCP next Monday to discuss;   Objective:  Vitals:   06/18/20 1327  BP: (!) 132/82  Pulse: 80  Temp: 97.8 F (36.6 C)  TempSrc: Oral  SpO2: 98%  Weight: (!) 64 lb 3.2 oz (29.1 kg)    General: Well developed, well nourished, in no acute distress  Skin : Warm and dry.  Head: Normocephalic and atraumatic  Lungs: Respirations unlabored;  CVS exam: normal rate and regular rhythm.  Neurologic: Alert and oriented; speech intact; face symmetrical;  In wheelchair;  Assessment:  1. Frequent urination     Plan:  Suspect UTI; check U/A and urine culture; Rx for Ceftin 250 mg bid x 5 days; Keep planned follow-up with her PCP next week to discuss chronic insomnia;  This visit occurred during the SARS-CoV-2  public health emergency.  Safety protocols were in place, including screening questions prior to the visit, additional usage of staff PPE, and extensive cleaning of exam room while observing appropriate contact time as indicated for disinfecting solutions.     No follow-ups on file.  Orders Placed This Encounter  Procedures   Urine Culture    Standing Status:   Future    Standing Expiration Date:   06/18/2021   POCT Urinalysis Dipstick    Requested Prescriptions   Signed Prescriptions Disp Refills   cefUROXime (CEFTIN) 250 MG tablet 10 tablet 0    Sig: Take 1 tablet (250 mg total) by mouth 2 (two) times daily with a meal.

## 2020-06-20 LAB — URINE CULTURE

## 2020-06-21 ENCOUNTER — Telehealth: Payer: Self-pay

## 2020-06-21 NOTE — Telephone Encounter (Signed)
Urine culture does not show an infection

## 2020-06-21 NOTE — Telephone Encounter (Signed)
New message    Calling for test results  

## 2020-06-22 NOTE — Telephone Encounter (Signed)
Message left for patient on yesterday with results.

## 2020-06-24 NOTE — Progress Notes (Signed)
Virtual Visit via Video Note  I connected with Elizabeth [name] on 06/25/20 at  2:45 PM EDT by a video enabled telemedicine application and verified that I am speaking with the correct person using two identifiers.   I discussed the limitations of evaluation and management by telemedicine and the availability of in person appointments. The patient expressed understanding and agreed to proceed.  Present for the visit:  Myself, Dr Billey Gosling, [name] and her neighbor Elizabeth [name].  The patient is currently at home and I am in the office.    No referring provider.    History of Present Illness: She is here for follow up of her chronic medical conditions.  She also wants to discuss insomnia.    She has difficulty sleeping.  She has difficulty falling asleep and staying asleep.  She take sertraline now and it does not help.  She wonders if there is something else I can prescribe that will help.  She has a caregiver overnight and they state she does not sleep.  She otherwise feels okay.  She will no longer be seeing her current neurologist and wanted me to prescribe her medication.   Social History   Socioeconomic History  . Marital status: Single    Spouse name: Not on file  . Number of children: 0  . Years of education: Not on file  . Highest education level: Some college, no degree  Occupational History  . Not on file  Tobacco Use  . Smoking status: Never Smoker  . Smokeless tobacco: Never Used  Vaping Use  . Vaping Use: Never used  Substance and Sexual Activity  . Alcohol use: Yes    Alcohol/week: 1.0 standard drink    Types: 1 Glasses of wine per week    Comment: 0.6 oz per week  . Drug use: No  . Sexual activity: Not on file  Other Topics Concern  . Not on file  Social History Narrative   One level home - alone   R handed   Caffeine - cup of coffee every am / tea at night    Social Determinants of Health   Financial Resource Strain:   . Difficulty of Paying Living Expenses:    Food Insecurity:   . Worried About Charity fundraiser in the Last Year:   . Arboriculturist in the Last Year:   Transportation Needs:   . Film/video editor (Medical):   Marland Kitchen Lack of Transportation (Non-Medical):   Physical Activity:   . Days of Exercise per Week:   . Minutes of Exercise per Session:   Stress:   . Feeling of Stress :   Social Connections:   . Frequency of Communication with Friends and Family:   . Frequency of Social Gatherings with Friends and Family:   . Attends Religious Services:   . Active Member of Clubs or Organizations:   . Attends Archivist Meetings:   Marland Kitchen Marital Status:      Observations/Objective: Appears thin and fragile, but in NAD Breathing normally Skin appears warm and dry  Assessment and Plan:  See Problem List for Assessment and Plan of chronic medical problems.   Follow Up Instructions:    I discussed the assessment and treatment plan with the patient. The patient was provided an opportunity to ask questions and all were answered. The patient agreed with the plan and demonstrated an understanding of the instructions.   The patient was advised to call back or  seek an in-person evaluation if the symptoms worsen or if the condition fails to improve as anticipated.    Binnie Rail, MD

## 2020-06-25 ENCOUNTER — Encounter: Payer: Self-pay | Admitting: Internal Medicine

## 2020-06-25 ENCOUNTER — Telehealth (INDEPENDENT_AMBULATORY_CARE_PROVIDER_SITE_OTHER): Payer: Medicare Other | Admitting: Internal Medicine

## 2020-06-25 DIAGNOSIS — G20 Parkinson's disease: Secondary | ICD-10-CM

## 2020-06-25 DIAGNOSIS — E034 Atrophy of thyroid (acquired): Secondary | ICD-10-CM

## 2020-06-25 DIAGNOSIS — F419 Anxiety disorder, unspecified: Secondary | ICD-10-CM

## 2020-06-25 DIAGNOSIS — G479 Sleep disorder, unspecified: Secondary | ICD-10-CM | POA: Diagnosis not present

## 2020-06-25 MED ORDER — MIRTAZAPINE 15 MG PO TABS: 7.5000 mg | ORAL_TABLET | Freq: Every day | ORAL | 5 refills | Status: DC

## 2020-06-25 MED ORDER — LEVOTHYROXINE SODIUM 88 MCG PO TABS: 88.0000 ug | ORAL_TABLET | Freq: Every day | ORAL | 1 refills | Status: DC

## 2020-06-25 NOTE — Assessment & Plan Note (Signed)
Chronic Currently taking sertraline, which may help a little, but still has the symptoms We will try Remeron since that will help with her sleep more

## 2020-06-25 NOTE — Assessment & Plan Note (Signed)
Somewhat chronic  not sleeping well Will stop sertraline and try mirtazapine 7.5 mg daily Advised to also try melatonin 1 - 2mg  at night

## 2020-06-25 NOTE — Assessment & Plan Note (Signed)
Chronic Advised that I cannot prescribe her Parkinson's medications Referred to [facility] neurology

## 2020-06-25 NOTE — Assessment & Plan Note (Signed)
Lab Results  Component Value Date   TSH 0.03 (L) 11/02/2018   Needs more has not been in the office in a while We will continue current dose of levothyroxine

## 2020-06-30 ENCOUNTER — Emergency Department (HOSPITAL_COMMUNITY): Payer: Medicare Other

## 2020-06-30 ENCOUNTER — Encounter (HOSPITAL_COMMUNITY): Payer: Self-pay | Admitting: Emergency Medicine

## 2020-06-30 ENCOUNTER — Inpatient Hospital Stay (HOSPITAL_COMMUNITY)
Admission: EM | Admit: 2020-06-30 | Discharge: 2020-07-06 | DRG: 064 | Disposition: A | Discharge status: Hospice | Payer: Medicare Other | Attending: Student | Admitting: Student

## 2020-06-30 DIAGNOSIS — E538 Deficiency of other specified B group vitamins: Secondary | ICD-10-CM | POA: Diagnosis present

## 2020-06-30 DIAGNOSIS — R4701 Aphasia: Secondary | ICD-10-CM | POA: Diagnosis present

## 2020-06-30 DIAGNOSIS — N390 Urinary tract infection, site not specified: Secondary | ICD-10-CM | POA: Diagnosis present

## 2020-06-30 DIAGNOSIS — E43 Unspecified severe protein-calorie malnutrition: Secondary | ICD-10-CM | POA: Diagnosis present

## 2020-06-30 DIAGNOSIS — E039 Hypothyroidism, unspecified: Secondary | ICD-10-CM | POA: Diagnosis present

## 2020-06-30 DIAGNOSIS — G20 Parkinson's disease: Secondary | ICD-10-CM | POA: Diagnosis present

## 2020-06-30 DIAGNOSIS — Z20822 Contact with and (suspected) exposure to covid-19: Secondary | ICD-10-CM | POA: Diagnosis present

## 2020-06-30 DIAGNOSIS — N8110 Cystocele, unspecified: Secondary | ICD-10-CM | POA: Diagnosis present

## 2020-06-30 DIAGNOSIS — Z515 Encounter for palliative care: Secondary | ICD-10-CM | POA: Diagnosis not present

## 2020-06-30 DIAGNOSIS — E034 Atrophy of thyroid (acquired): Secondary | ICD-10-CM

## 2020-06-30 DIAGNOSIS — M810 Age-related osteoporosis without current pathological fracture: Secondary | ICD-10-CM | POA: Diagnosis present

## 2020-06-30 DIAGNOSIS — E162 Hypoglycemia, unspecified: Secondary | ICD-10-CM | POA: Diagnosis present

## 2020-06-30 DIAGNOSIS — E785 Hyperlipidemia, unspecified: Secondary | ICD-10-CM | POA: Diagnosis present

## 2020-06-30 DIAGNOSIS — G2581 Restless legs syndrome: Secondary | ICD-10-CM | POA: Diagnosis present

## 2020-06-30 DIAGNOSIS — G14 Postpolio syndrome: Secondary | ICD-10-CM | POA: Diagnosis present

## 2020-06-30 DIAGNOSIS — B9620 Unspecified Escherichia coli [E. coli] as the cause of diseases classified elsewhere: Secondary | ICD-10-CM | POA: Diagnosis present

## 2020-06-30 DIAGNOSIS — Z8673 Personal history of transient ischemic attack (TIA), and cerebral infarction without residual deficits: Secondary | ICD-10-CM | POA: Diagnosis not present

## 2020-06-30 DIAGNOSIS — Z66 Do not resuscitate: Secondary | ICD-10-CM | POA: Diagnosis present

## 2020-06-30 DIAGNOSIS — Z820 Family history of epilepsy and other diseases of the nervous system: Secondary | ICD-10-CM

## 2020-06-30 DIAGNOSIS — G839 Paralytic syndrome, unspecified: Secondary | ICD-10-CM | POA: Diagnosis present

## 2020-06-30 DIAGNOSIS — R627 Adult failure to thrive: Secondary | ICD-10-CM | POA: Diagnosis present

## 2020-06-30 DIAGNOSIS — Z4659 Encounter for fitting and adjustment of other gastrointestinal appliance and device: Secondary | ICD-10-CM

## 2020-06-30 DIAGNOSIS — N3001 Acute cystitis with hematuria: Secondary | ICD-10-CM

## 2020-06-30 DIAGNOSIS — M8580 Other specified disorders of bone density and structure, unspecified site: Secondary | ICD-10-CM | POA: Diagnosis present

## 2020-06-30 DIAGNOSIS — Z681 Body mass index (BMI) 19 or less, adult: Secondary | ICD-10-CM | POA: Diagnosis not present

## 2020-06-30 DIAGNOSIS — D649 Anemia, unspecified: Secondary | ICD-10-CM | POA: Diagnosis present

## 2020-06-30 DIAGNOSIS — G9341 Metabolic encephalopathy: Secondary | ICD-10-CM | POA: Diagnosis present

## 2020-06-30 DIAGNOSIS — I63512 Cerebral infarction due to unspecified occlusion or stenosis of left middle cerebral artery: Principal | ICD-10-CM | POA: Diagnosis present

## 2020-06-30 DIAGNOSIS — G9340 Encephalopathy, unspecified: Secondary | ICD-10-CM

## 2020-06-30 DIAGNOSIS — R54 Age-related physical debility: Secondary | ICD-10-CM | POA: Diagnosis present

## 2020-06-30 DIAGNOSIS — G20A1 Parkinson's disease without dyskinesia, without mention of fluctuations: Secondary | ICD-10-CM | POA: Diagnosis present

## 2020-06-30 DIAGNOSIS — Z79890 Hormone replacement therapy (postmenopausal): Secondary | ICD-10-CM

## 2020-06-30 DIAGNOSIS — Z79899 Other long term (current) drug therapy: Secondary | ICD-10-CM

## 2020-06-30 LAB — RAPID URINE DRUG SCREEN, HOSP PERFORMED
Amphetamines: NOT DETECTED
Barbiturates: NOT DETECTED
Benzodiazepines: NOT DETECTED
Cocaine: NOT DETECTED
Opiates: NOT DETECTED
Tetrahydrocannabinol: NOT DETECTED

## 2020-06-30 LAB — SARS CORONAVIRUS 2 BY RT PCR (HOSPITAL ORDER, PERFORMED IN ~~LOC~~ HOSPITAL LAB): SARS Coronavirus 2: NEGATIVE

## 2020-06-30 LAB — CBC WITH DIFFERENTIAL/PLATELET
Abs Immature Granulocytes: 0.02 10*3/uL (ref 0.00–0.07)
Basophils Absolute: 0 10*3/uL (ref 0.0–0.1)
Basophils Relative: 0 %
Eosinophils Absolute: 0.1 10*3/uL (ref 0.0–0.5)
Eosinophils Relative: 3 %
HCT: 33.7 % — ABNORMAL LOW (ref 36.0–46.0)
Hemoglobin: 10.5 g/dL — ABNORMAL LOW (ref 12.0–15.0)
Immature Granulocytes: 0 %
Lymphocytes Relative: 19 %
Lymphs Abs: 1 10*3/uL (ref 0.7–4.0)
MCH: 28.4 pg (ref 26.0–34.0)
MCHC: 31.2 g/dL (ref 30.0–36.0)
MCV: 91.1 fL (ref 80.0–100.0)
Monocytes Absolute: 0.6 10*3/uL (ref 0.1–1.0)
Monocytes Relative: 11 %
Neutro Abs: 3.4 10*3/uL (ref 1.7–7.7)
Neutrophils Relative %: 67 %
Platelets: 166 10*3/uL (ref 150–400)
RBC: 3.7 MIL/uL — ABNORMAL LOW (ref 3.87–5.11)
RDW: 15.3 % (ref 11.5–15.5)
WBC: 5.1 10*3/uL (ref 4.0–10.5)
nRBC: 0 % (ref 0.0–0.2)

## 2020-06-30 LAB — COMPREHENSIVE METABOLIC PANEL
ALT: 6 U/L (ref 0–44)
AST: 19 U/L (ref 15–41)
Albumin: 3.1 g/dL — ABNORMAL LOW (ref 3.5–5.0)
Alkaline Phosphatase: 67 U/L (ref 38–126)
Anion gap: 10 (ref 5–15)
BUN: 19 mg/dL (ref 8–23)
CO2: 23 mmol/L (ref 22–32)
Calcium: 8.6 mg/dL — ABNORMAL LOW (ref 8.9–10.3)
Chloride: 109 mmol/L (ref 98–111)
Creatinine, Ser: 0.68 mg/dL (ref 0.44–1.00)
GFR calc Af Amer: >60 mL/min (ref >60)
GFR calc non Af Amer: >60 mL/min (ref >60)
Glucose, Bld: 78 mg/dL (ref 70–99)
Potassium: 4.2 mmol/L (ref 3.5–5.1)
Sodium: 142 mmol/L (ref 135–145)
Total Bilirubin: 1.3 mg/dL — ABNORMAL HIGH (ref 0.3–1.2)
Total Protein: 5.5 g/dL — ABNORMAL LOW (ref 6.5–8.1)

## 2020-06-30 LAB — LACTIC ACID, PLASMA
Lactic Acid, Venous: 0.8 mmol/L (ref 0.5–1.9)
Lactic Acid, Venous: 1.1 mmol/L (ref 0.5–1.9)

## 2020-06-30 LAB — PROTIME-INR
INR: 1.1 (ref 0.8–1.2)
Prothrombin Time: 13.6 seconds (ref 11.4–15.2)

## 2020-06-30 LAB — URINALYSIS, ROUTINE W REFLEX MICROSCOPIC
Bilirubin Urine: NEGATIVE
Glucose, UA: NEGATIVE mg/dL
Ketones, ur: NEGATIVE mg/dL
Nitrite: POSITIVE — AB
Protein, ur: NEGATIVE mg/dL
Specific Gravity, Urine: 1.009 (ref 1.005–1.030)
WBC, UA: >50 WBC/hpf — ABNORMAL HIGH (ref 0–5)
pH: 6 (ref 5.0–8.0)

## 2020-06-30 LAB — T4, FREE: Free T4: 1.84 ng/dL — ABNORMAL HIGH (ref 0.61–1.12)

## 2020-06-30 LAB — TROPONIN I (HIGH SENSITIVITY)
Troponin I (High Sensitivity): 14 ng/L (ref <18)
Troponin I (High Sensitivity): 15 ng/L (ref <18)

## 2020-06-30 LAB — GLUCOSE, CAPILLARY: Glucose-Capillary: 85 mg/dL (ref 70–99)

## 2020-06-30 LAB — TSH: TSH: <0.01 u[IU]/mL — ABNORMAL LOW (ref 0.350–4.500)

## 2020-06-30 LAB — CBG MONITORING, ED
Glucose-Capillary: 57 mg/dL — ABNORMAL LOW (ref 70–99)
Glucose-Capillary: 206 mg/dL — ABNORMAL HIGH (ref 70–99)

## 2020-06-30 MED ORDER — CARBIDOPA-LEVODOPA ER 36.25-145 MG PO CPCR: 2.0000 | ORAL_CAPSULE | Freq: Two times a day (BID) | ORAL | Status: DC

## 2020-06-30 MED ORDER — MIRTAZAPINE 15 MG PO TABS: 7.5000 mg | ORAL_TABLET | Freq: Every day | ORAL | Status: DC

## 2020-06-30 MED ORDER — DEXTROSE 50 % IV SOLN
25.0000 mL | Freq: Once | INTRAVENOUS | Status: AC
  Administered 2020-06-30: 25 mL via INTRAVENOUS
  Filled 2020-06-30: qty 50

## 2020-06-30 MED ORDER — ENOXAPARIN SODIUM 30 MG/0.3ML ~~LOC~~ SOLN
30.0000 mg | SUBCUTANEOUS | Status: DC
  Administered 2020-06-30: 30 mg via SUBCUTANEOUS
  Filled 2020-06-30: qty 0.3

## 2020-06-30 MED ORDER — ONDANSETRON HCL 4 MG PO TABS: 4.0000 mg | ORAL_TABLET | Freq: Four times a day (QID) | ORAL | Status: DC | PRN

## 2020-06-30 MED ORDER — SODIUM CHLORIDE 0.9 % IV SOLN
1.0000 g | Freq: Once | INTRAVENOUS | Status: AC
  Administered 2020-06-30: 1 g via INTRAVENOUS
  Filled 2020-06-30: qty 10

## 2020-06-30 MED ORDER — ALBUTEROL SULFATE (2.5 MG/3ML) 0.083% IN NEBU: 2.5000 mg | INHALATION_SOLUTION | Freq: Four times a day (QID) | RESPIRATORY_TRACT | Status: DC | PRN

## 2020-06-30 MED ORDER — ONDANSETRON HCL 4 MG/2ML IJ SOLN: 4.0000 mg | Freq: Four times a day (QID) | INTRAMUSCULAR | Status: DC | PRN

## 2020-06-30 MED ORDER — LEVOTHYROXINE SODIUM 50 MCG PO TABS: 50.0000 ug | ORAL_TABLET | Freq: Every day | ORAL | Status: DC

## 2020-06-30 MED ORDER — LIDOCAINE 4 % EX CREA
TOPICAL_CREAM | Freq: Every day | CUTANEOUS | Status: DC | PRN
  Filled 2020-06-30: qty 5

## 2020-06-30 MED ORDER — SODIUM CHLORIDE 0.9 % IV BOLUS
500.0000 mL | Freq: Once | INTRAVENOUS | Status: AC
  Administered 2020-06-30: 500 mL via INTRAVENOUS

## 2020-06-30 MED ORDER — SODIUM CHLORIDE 0.9% FLUSH
3.0000 mL | Freq: Two times a day (BID) | INTRAVENOUS | Status: DC
  Administered 2020-06-30 – 2020-07-06 (×8): 3 mL via INTRAVENOUS

## 2020-06-30 MED ORDER — ACETAMINOPHEN 325 MG PO TABS: 650.0000 mg | ORAL_TABLET | Freq: Four times a day (QID) | ORAL | Status: DC | PRN

## 2020-06-30 MED ORDER — PIPERACILLIN-TAZOBACTAM 3.375 G IVPB
3.3750 g | Freq: Three times a day (TID) | INTRAVENOUS | Status: DC
  Administered 2020-06-30 – 2020-07-01 (×3): 3.375 g via INTRAVENOUS
  Filled 2020-06-30 (×2): qty 50

## 2020-06-30 MED ORDER — SODIUM CHLORIDE 0.9 % IV SOLN: 1.0000 g | INTRAVENOUS | Status: DC

## 2020-06-30 MED ORDER — ACETAMINOPHEN 650 MG RE SUPP
650.0000 mg | Freq: Four times a day (QID) | RECTAL | Status: DC | PRN
  Administered 2020-06-30: 650 mg via RECTAL
  Filled 2020-06-30: qty 1

## 2020-06-30 NOTE — Progress Notes (Signed)
   06/30/20 2216  Assess: MEWS Score  Temp 99.3 F (37.4 C)  BP 119/81  Pulse Rate 100  Resp 18  Level of Consciousness Responds to Pain  SpO2 96 %  O2 Device Room Air  Patient Activity (if Appropriate) In bed  Assess: MEWS Score  MEWS Temp 0  MEWS Systolic 0  MEWS Pulse 0  MEWS RR 0  MEWS LOC 2  MEWS Score 2  MEWS Score Color Yellow  Assess: if the MEWS score is Yellow or Red  Were vital signs taken at a resting state? Yes  Focused Assessment No change from prior assessment  Early Detection of Sepsis Score *See Row Information* Medium  MEWS guidelines implemented *See Row Information* No, previously yellow, continue vital signs every 4 hours  Document  Patient Outcome Stabilized after interventions  Progress note created (see row info) Yes

## 2020-06-30 NOTE — ED Triage Notes (Signed)
Pt in form home via [facility] as AMS, unresponsive and drooling when EMS arrived to house. Baseline, the pt lives by herself, walks w/o assist, and is a&ox4. Pt's LSN 2 days ago per home health staff. Home health RN called 911 today when they got to her house. Pt feels warm, and finished abx for UTI Saturday. Pupils 4 and slow rxn. Got 848ml NS en route. HR ranging from 80-120's

## 2020-06-30 NOTE — ED Notes (Signed)
Pt is A-fib on monitor 

## 2020-06-30 NOTE — H&P (Addendum)
History and Physical    Elizabeth [name] WUX:[id number] DOB: 12/14/1930 DOA: 06/30/2020  Referring MD/NP/PA: Everrett Coombe, MD PCP: Binnie Rail, MD  Patient coming from: Home lives alone via EMS  Chief Complaint: Unresponsiveness  I have personally briefly reviewed patient's old medical records in [place]   HPI: Elizabeth [name] is a 86 y.o. female with medical history significant of CVA, hyperlipidemia, hypothyroidism, Parkinson's disease, TIA/CVA, and vitamin B12 deficiency presents after being found unresponsive.  At baseline patient lives at home alone and is reportedly alert and oriented x4.  She was found to be unresponsive by home health staff.  At this time patient is unable to provide any history.  She had just recently been treated for urinary tract infection for which cultures from 7/26 had grown out mixed flora.  She completed her course of Ceftin 1 week ago.  Last hospitalized in March of this year for urinary tract infection with multiresistant Proteus Penneri.  When patient was found it was reported that she had a bottle of Valium(over 75 years old) and sertraline.  Sertraline had reportedly just recently been changed to mirtazapine.   ED Course: On admission into the emergency department patient was seen to be afebrile with stable vital signs.  Labs noted hemoglobin 10.5, cardiac troponin negative x2, and lactic acid 0.8.  Urinalysis was positive for small hemoglobin, large leukocytes, positive nitrates, and greater than 50 WBCs.  Blood and urine cultures have been obtained.  Patient was started empirically on Rocephin IV.  Review of Systems  Unable to perform ROS: Mental status change    Past Medical History:  Diagnosis Date  . Abnormal Pap smear of vagina 09/2006   Dr Nori Riis  . Abnormality of gait 05/24/2015   PMH R occipital infarct B12 deficiency H/o polio affected left leg  In Physical Therapy to improve balance & strength through [facility]  . Arthritis 12/25/2015   . Cervicalgia 01/17/2009   MRI 03/27/16:  IMPRESSION: 1. At C3-4 there is a mild broad-based disc bulge. Moderate bilateral facet arthropathy and uncovertebral degenerative changes resulting in moderate bilateral foraminal stenosis. 2. At C5-6 there is a broad-based disc osteophyte complex. Bilateral uncovertebral degenerative changes and facet arthropathy resulting in moderate bilateral foraminal stenosis.  . Fall 09/11/2017  . History of CVA (cerebrovascular accident) 07/20/2012   Chronic R occipital infarct ; stable on CT 05/21/2012 .Dr Jannifer Franklin , Neurology   . History of TIA (transient ischemic attack) 01/26/2008  . HYPERLIPIDEMIA 01/26/2008   NMR Lipoprofile 2009: LDL 135 (1704/911), HDL 62, TG 131. LDL goal = < 100; ideally < 70. MGF MI in 63s No FH CVA  . Hypothyroidism   . Intention tremor 12/08/2013   Onset after fall 04/2012   . Irritable bowel syndrome (IBS) 01/17/2009   Dr. Unice Cobble  . Osteopenia    PMH fracture heel, wrist  . Parkinson disease ([place]) 06/25/2016   Dr Tat  . Pernicious anemia   . Post-polio syndrome    minor problems L leg  . Restless leg syndrome 12/25/2015   Dr Tat  . Subdural hygroma 09/11/2017  . Swallowing disorder 03/04/2017  . Urinary incontinence 09/07/2008   Dr McDiarmid, Urology   . Vitamin B12 deficiency   . Weakness 06/24/2017    Past Surgical History:  Procedure Laterality Date  . BLADDER SUSPENSION  2000  . CATARACT EXTRACTION, BILATERAL     Dr. Gershon Crane  . no colonoscopy     "I never felt I  needed one "  . YAG LASER APPLICATION Left    Dr. Gershon Crane     reports that she has never smoked. She has never used smokeless tobacco. She reports current alcohol use of about 1.0 standard drink of alcohol per week. She reports that she does not use drugs.  Allergies  Allergen Reactions  . Atorvastatin Other (See Comments)    REACTION: Felt funny (only way patient could describe)  . Crestor [Rosuvastatin Calcium] Other (See Comments)    Leg cramps,  muscle aches  . Vesicare [Solifenacin]     constipation    Family History  Problem Relation Age of Onset  . Heart attack Maternal Grandfather        70s  . Emphysema Mother   . Hypertension Mother   . Alzheimer's disease Father   . Breast cancer Maternal Grandmother   . Cancer Maternal Grandmother        breast  . Diabetes Neg Hx   . Stroke Neg Hx   . Parkinson's disease Neg Hx     Prior to Admission medications   Medication Sig Start Date End Date Taking? Authorizing Provider  Carbidopa-Levodopa ER (RYTARY) 36.25-145 MG CPCR TAKE 2 TABLETS EACH A.M., 2 IN THE AFTERNOON AND 1 TABLET EACH EVENING. 05/01/20   Tat, Eustace Quail, DO  cefUROXime (CEFTIN) 250 MG tablet Take 1 tablet (250 mg total) by mouth 2 (two) times daily with a meal. 06/18/20   Marrian Salvage, FNP  Cholecalciferol (VITAMIN D) 125 MCG (5000 UT) CAPS Take 1 capsule by mouth daily.     [provider]  levothyroxine (SYNTHROID) 88 MCG tablet Take 1 tablet (88 mcg total) by mouth daily. 06/25/20   Binnie Rail, MD  mirtazapine (REMERON) 15 MG tablet Take 0.5 tablets (7.5 mg total) by mouth at bedtime. 06/25/20   Binnie Rail, MD  Pimavanserin Tartrate (NUPLAZID) 34 MG CAPS Take 1 capsule (34 mg total) by mouth daily. 01/20/20   Tat, Eustace Quail, DO  traMADol (ULTRAM) 50 MG tablet Take 1 tablet (50 mg total) by mouth every 6 (six) hours as needed. 04/18/20   Marybelle Killings, MD  vitamin B-12 (CYANOCOBALAMIN) 1000 MCG tablet Take 1 tablet (1,000 mcg total) by mouth daily. 01/10/19   Hennie Duos, MD    Physical Exam:  Constitutional: Elderly female who is lethargic not really able to follow commands at this time Vitals:   06/30/20 1038 06/30/20 1042 06/30/20 1056  BP: 128/74    Pulse: 61    Resp: 14    Temp: 98.4 F (36.9 C)    TempSrc: Oral    SpO2: 100% 98%   Weight:   29.5 kg   Eyes: PERRL, lids and conjunctivae normal ENMT: Mucous membranes are moist. Posterior pharynx clear of any exudate or  lesions.  Neck: normal, supple, no masses, no thyromegaly Respiratory: clear to auscultation bilaterally, no wheezing, no crackles. Normal respiratory effort. No accessory muscle use.  Cardiovascular: Regular rate and rhythm, no murmurs / rubs / gallops. No extremity edema. 2+ pedal pulses. No carotid bruits.  Abdomen: no tenderness, no masses palpated. No hepatosplenomegaly. Bowel sounds positive.  Genitourinary: Bladder prolapse appreciated Musculoskeletal: no clubbing / cyanosis. No joint deformity upper and lower extremities. Good ROM, no contractures. Normal muscle tone.  Skin: no rashes, lesions, ulcers. No induration Neurologic: Appears to move all extremities. Psychiatric: Lethargic and unable to assess at this time    Labs on Admission: I have personally reviewed following labs  and imaging studies  CBC: Recent Labs  Lab 06/30/20 1059  WBC 5.1  NEUTROABS 3.4  HGB 10.5*  HCT 33.7*  MCV 91.1  PLT 268   Basic Metabolic Panel: Recent Labs  Lab 06/30/20 1059  NA 142  K 4.2  CL 109  CO2 23  GLUCOSE 78  BUN 19  CREATININE 0.68  CALCIUM 8.6*   GFR: Estimated Creatinine Clearance: 23.1 mL/min (by C-G formula based on SCr of 0.68 mg/dL). Liver Function Tests: Recent Labs  Lab 06/30/20 1059  AST 19  ALT 6  ALKPHOS 67  BILITOT 1.3*  PROT 5.5*  ALBUMIN 3.1*   No results for input(s): LIPASE, AMYLASE in the last 168 hours. No results for input(s): AMMONIA in the last 168 hours. Coagulation Profile: Recent Labs  Lab 06/30/20 1059  INR 1.1   Cardiac Enzymes: No results for input(s): CKTOTAL, CKMB, CKMBINDEX, TROPONINI in the last 168 hours. BNP (last 3 results) No results for input(s): PROBNP in the last 8760 hours. HbA1C: No results for input(s): HGBA1C in the last 72 hours. CBG: No results for input(s): GLUCAP in the last 168 hours. Lipid Profile: No results for input(s): CHOL, HDL, LDLCALC, TRIG, CHOLHDL, LDLDIRECT in the last 72 hours. Thyroid  Function Tests: Recent Labs    06/30/20 1215  TSH <0.010*   Anemia Panel: No results for input(s): VITAMINB12, FOLATE, FERRITIN, TIBC, IRON, RETICCTPCT in the last 72 hours. Urine analysis:    Component Value Date/Time   COLORURINE YELLOW 06/30/2020 1215   APPEARANCEUR HAZY (A) 06/30/2020 1215   LABSPEC 1.009 06/30/2020 1215   PHURINE 6.0 06/30/2020 1215   GLUCOSEU NEGATIVE 06/30/2020 1215   GLUCOSEU NEGATIVE 02/14/2020 1624   HGBUR SMALL (A) 06/30/2020 1215   BILIRUBINUR NEGATIVE 06/30/2020 1215   BILIRUBINUR Negative 06/18/2020 1338   KETONESUR NEGATIVE 06/30/2020 1215   PROTEINUR NEGATIVE 06/30/2020 1215   UROBILINOGEN 0.2 06/18/2020 1338   UROBILINOGEN 0.2 02/14/2020 1624   NITRITE POSITIVE (A) 06/30/2020 1215   LEUKOCYTESUR LARGE (A) 06/30/2020 1215   Sepsis Labs: No results found for this or any previous visit (from the past 240 hour(s)).   Radiological Exams on Admission: CT Head Wo Contrast  Result Date: 06/30/2020 CLINICAL DATA:  Altered mental status EXAM: CT HEAD WITHOUT CONTRAST TECHNIQUE: Contiguous axial images were obtained from the base of the skull through the vertex without intravenous contrast. COMPARISON:  01/31/2020 FINDINGS: Brain: No evidence of acute infarction, hemorrhage, hydrocephalus, extra-axial collection or mass lesion/mass effect. Encephalomalacia related to remote infarct in the right occipital lobe. Multiple remote left basal ganglia lacunar infarcts. Extensive low-density changes within the periventricular and subcortical white matter compatible with chronic microvascular ischemic change. Mild-moderate diffuse cerebral volume loss. Vascular: Atherosclerotic calcifications involving the large vessels of the skull base. No unexpected hyperdense vessel. Skull: Normal. Negative for fracture or focal lesion. Sinuses/Orbits: Mild left maxillary mucosal thickening. Paranasal sinuses and mastoid air cells are otherwise clear. Orbital structures intact.  Other: None. IMPRESSION: 1. No acute intracranial findings. 2. Chronic microvascular ischemic change and cerebral volume loss. Electronically Signed   By: Davina Poke D.O.   On: 06/30/2020 13:25   DG Chest Port 1 View  Result Date: 06/30/2020 CLINICAL DATA:  Unresponsive EXAM: PORTABLE CHEST 1 VIEW COMPARISON:  None. FINDINGS: Normal cardiac silhouette. Lungs are hyperinflated. No effusion, infiltrate or pneumothorax. No focal consolidation. IMPRESSION: No acute cardiopulmonary process. Electronically Signed   By: Suzy Bouchard M.D.   On: 06/30/2020 14:10    Chest x-ray: Independently  reviewed.  No acute abnormality  Assessment/Plan Acute metabolic encephalopathy: Patient was found to be acutely altered.  Initial CT scan of the head negative for any acute abnormalities.  No focal deficits appreciated.  Initial work-up revealed signs of acute urinary tract infection, glucose 78, and TSH <0.01.  Patient was reported to have been found with bottle of Valium and sertraline although sertraline reportedly was recently stopped and replaced with mirtazapine. -Admit to a medical telemetry bed -Neurochecks -Check urine drug screen -May warrant further work-up if symptoms do not improve  Urinary tract infection: Acute.  Urinalysis was positive for signs of infection.  Prior history of Proteus penneri with multiple resistances.  Patient had initially been started on Rocephin IV, but just completed course of Ceftin. -Follow-up blood and urine cultures -Changed antibiotics to Zosyn\  Hypoglycemia: Acute.  Initial glucose noted to be 78 on repeat check 57. -Hypoglycemic protocols  -Give half an amp of D50 x1 dose, -CBGs every 4 hours x4  Normocytic anemia: Hemoglobin 10.5 g/dL on admission which appears near patient's baseline. -Continue to monitor  Bladder prolapse: Acute. Patient was seen to have vaginal prolapse on physical exam.  - Discussed with OB/GYN over the phone for which they  recommended outpatient management with OB/GYN or urology.  Parkinson's disease: Home medications include carbidopa levodopa ER, but was not taking Nuplazid due to coughs. -Continue carbidopa-levodopa ER  Hypothyroidism: TSH noted to be <0.01 and free T4 elevated at 1.84.  Patient's levothyroxine dose was 88 mcg daily. -Decrease levothyroxine to 50 mcg daily -Recheck TSH in 4 to 6 weeks.  History of CVA/TIA: Patient with prior history of right occipital stroke.  DVT prophylaxis: Lovenox Code Status: Full Disposition Plan: To be determined Consults called: None Admission status: Inpatient  Norval Morton MD [facility] Pager [phone]   If 7PM-7AM, please contact night-coverage [email or web address] Password [facility]  06/30/2020, 2:29 PM

## 2020-06-30 NOTE — Progress Notes (Signed)
   06/30/20 2016  Assess: MEWS Score  Temp (!) 101.2 F (38.4 C)  BP (!) 129/92  Pulse Rate 93  Resp 19  Assess: MEWS Score  MEWS Temp 1  MEWS Systolic 0  MEWS Pulse 0  MEWS RR 0  MEWS LOC 2  MEWS Score 3  MEWS Score Color Yellow  Assess: if the MEWS score is Yellow or Red  Were vital signs taken at a resting state? Yes  Focused Assessment No change from prior assessment  Early Detection of Sepsis Score *See Row Information* Medium  MEWS guidelines implemented *See Row Information* Yes  Treat  MEWS Interventions Administered prn meds/treatments  Pain Scale Faces  Faces Pain Scale 4  Take Vital Signs  Increase Vital Sign Frequency  Yellow: Q 2hr X 2 then Q 4hr X 2, if remains yellow, continue Q 4hrs  Escalate  MEWS: Escalate Yellow: discuss with charge nurse/RN and consider discussing with provider and RRT  Notify: Charge Nurse/RN  Name of Charge Nurse/RN Notified Sam RN  Date Charge Nurse/RN Notified 06/30/20  Time Charge Nurse/RN Notified 2045

## 2020-06-30 NOTE — ED Notes (Signed)
Changed pt's brief and bedding. I noticed when changing pt, pt has a prolapsed bladder. I had Network engineer page admitting doc and I'm waiting for a call back.

## 2020-06-30 NOTE — Plan of Care (Signed)
  Problem: Pain Managment: Goal: General experience of comfort will improve Outcome: Progressing   

## 2020-06-30 NOTE — ED Provider Notes (Signed)
[facility] EMERGENCY DEPARTMENT Provider Note   CSN: [id number] Arrival date & time: 06/30/20  1035     History Chief Complaint  Patient presents with  . Altered Mental Status    Elizabeth [name] is a 86 y.o. female.  Level 5 caveat secondary to altered mental status.  Patient last known well 2 days ago per home health staff.  They found her today unresponsive.  Usually walks without assistance and is alert and oriented per EMS.  Patient is unable to give any history.  Recent UTI.  Per chart was prescribed Ceftin 250 mg twice daily for 5 days.  Urine culture just grew out mixed.  The history is provided by the EMS personnel.  Altered Mental Status Presenting symptoms: lethargy   Severity:  Severe Most recent episode:  Today Timing:  Constant Progression:  Unchanged Chronicity:  New Context: recent infection   Context: not dementia        Past Medical History:  Diagnosis Date  . Abnormal Pap smear of vagina 09/2006   Dr Nori Riis  . Abnormality of gait 05/24/2015   PMH R occipital infarct B12 deficiency H/o polio affected left leg  In Physical Therapy to improve balance & strength through [facility]  . Arthritis 12/25/2015  . Cervicalgia 01/17/2009   MRI 03/27/16:  IMPRESSION: 1. At C3-4 there is a mild broad-based disc bulge. Moderate bilateral facet arthropathy and uncovertebral degenerative changes resulting in moderate bilateral foraminal stenosis. 2. At C5-6 there is a broad-based disc osteophyte complex. Bilateral uncovertebral degenerative changes and facet arthropathy resulting in moderate bilateral foraminal stenosis.  . Fall 09/11/2017  . History of CVA (cerebrovascular accident) 07/20/2012   Chronic R occipital infarct ; stable on CT 05/21/2012 .Dr Jannifer Franklin , Neurology   . History of TIA (transient ischemic attack) 01/26/2008  . HYPERLIPIDEMIA 01/26/2008   NMR Lipoprofile 2009: LDL 135 (1704/911), HDL 62, TG 131. LDL goal = < 100; ideally < 70. MGF MI in 33s No FH  CVA  . Hypothyroidism   . Intention tremor 12/08/2013   Onset after fall 04/2012   . Irritable bowel syndrome (IBS) 01/17/2009   Dr. Unice Cobble  . Osteopenia    PMH fracture heel, wrist  . Parkinson disease ([place]) 06/25/2016   Dr Tat  . Pernicious anemia   . Post-polio syndrome    minor problems L leg  . Restless leg syndrome 12/25/2015   Dr Tat  . Subdural hygroma 09/11/2017  . Swallowing disorder 03/04/2017  . Urinary incontinence 09/07/2008   Dr McDiarmid, Urology   . Vitamin B12 deficiency   . Weakness 06/24/2017    Patient Active Problem List   Diagnosis Date Noted  . Sleep difficulties 05/22/2020  . Radius and ulna distal fracture 04/24/2020  . Sepsis secondary to UTI ([place]) 01/31/2020  . Constipation 01/22/2019  . Malnutrition of moderate degree 12/17/2018  . Pars defect of lumbar spine 12/16/2018  . Compression fracture of L3 vertebra ([place]) 11/02/2018  . Right shoulder pain 11/02/2018  . Left groin pain 10/19/2018  . Low back pain 10/19/2018  . Osteoporosis 05/30/2018  . Poor posture 04/28/2018  . Physical deconditioning 04/28/2018  . Anxiety 03/02/2018  . Foot pain, bilateral 12/21/2017  . Bilateral leg edema 12/02/2017  . Overactive bladder 09/19/2017  . Post-polio syndrome   . Vitamin B12 deficiency   . Fall 09/11/2017  . Subdural hygroma 09/11/2017  . Swallowing disorder 03/04/2017  . Parkinson disease ([place]) 06/25/2016  . Restless leg syndrome  12/25/2015  . Arthritis 12/25/2015  . Abnormality of gait 05/24/2015  . History of CVA (cerebrovascular accident) 07/20/2012  . Cervicalgia 01/17/2009  . Irritable bowel syndrome (IBS) 01/17/2009  . Urinary incontinence 09/07/2008  . Normocytic anemia 04/26/2008  . HYPERLIPIDEMIA 01/26/2008  . History of TIA (transient ischemic attack) 01/26/2008  . Hypothyroidism 01/04/2008    Past Surgical History:  Procedure Laterality Date  . BLADDER SUSPENSION  2000  . CATARACT EXTRACTION, BILATERAL     Dr. Gershon Crane  .  no colonoscopy     "I never felt I needed one "  . YAG LASER APPLICATION Left    Dr. Gershon Crane     OB History   No obstetric history on file.     Family History  Problem Relation Age of Onset  . Heart attack Maternal Grandfather        70s  . Emphysema Mother   . Hypertension Mother   . Alzheimer's disease Father   . Breast cancer Maternal Grandmother   . Cancer Maternal Grandmother        breast  . Diabetes Neg Hx   . Stroke Neg Hx   . Parkinson's disease Neg Hx     Social History   Tobacco Use  . Smoking status: Never Smoker  . Smokeless tobacco: Never Used  Vaping Use  . Vaping Use: Never used  Substance Use Topics  . Alcohol use: Yes    Alcohol/week: 1.0 standard drink    Types: 1 Glasses of wine per week    Comment: 0.6 oz per week  . Drug use: No    Home Medications Prior to Admission medications   Medication Sig Start Date End Date Taking? Authorizing Provider  Carbidopa-Levodopa ER (RYTARY) 36.25-145 MG CPCR TAKE 2 TABLETS EACH A.M., 2 IN THE AFTERNOON AND 1 TABLET EACH EVENING. 05/01/20   Tat, Eustace Quail, DO  cefUROXime (CEFTIN) 250 MG tablet Take 1 tablet (250 mg total) by mouth 2 (two) times daily with a meal. 06/18/20   Marrian Salvage, FNP  Cholecalciferol (VITAMIN D) 125 MCG (5000 UT) CAPS Take 1 capsule by mouth daily.     [provider]  levothyroxine (SYNTHROID) 88 MCG tablet Take 1 tablet (88 mcg total) by mouth daily. 06/25/20   Binnie Rail, MD  mirtazapine (REMERON) 15 MG tablet Take 0.5 tablets (7.5 mg total) by mouth at bedtime. 06/25/20   Binnie Rail, MD  Pimavanserin Tartrate (NUPLAZID) 34 MG CAPS Take 1 capsule (34 mg total) by mouth daily. 01/20/20   Tat, Eustace Quail, DO  traMADol (ULTRAM) 50 MG tablet Take 1 tablet (50 mg total) by mouth every 6 (six) hours as needed. 04/18/20   Marybelle Killings, MD  vitamin B-12 (CYANOCOBALAMIN) 1000 MCG tablet Take 1 tablet (1,000 mcg total) by mouth daily. 01/10/19   Hennie Duos, MD     Allergies    Atorvastatin, Crestor [rosuvastatin calcium], and Vesicare [solifenacin]  Review of Systems   Review of Systems  Unable to perform ROS: Mental status change    Physical Exam Updated Vital Signs BP 128/74 (BP Location: Right Arm)   Pulse 61   Temp 98.4 F (36.9 C) (Oral)   Resp 14   Wt 29.5 kg   SpO2 98%   BMI 12.69 kg/m   Physical Exam Vitals and nursing note reviewed.  Constitutional:      General: She is not in acute distress.    Appearance: She is well-developed and underweight.  HENT:  Head: Normocephalic and atraumatic.  Eyes:     Conjunctiva/sclera: Conjunctivae normal.  Cardiovascular:     Rate and Rhythm: Normal rate and regular rhythm.     Heart sounds: No murmur heard.   Pulmonary:     Effort: Pulmonary effort is normal. No respiratory distress.     Breath sounds: Normal breath sounds.  Abdominal:     Palpations: Abdomen is soft.     Tenderness: There is no abdominal tenderness.  Musculoskeletal:        General: No deformity. Normal range of motion.     Cervical back: Neck supple.  Skin:    General: Skin is warm and dry.     Capillary Refill: Capillary refill takes less than 2 seconds.  Neurological:     Mental Status: She is alert.     Comments: Patient is awake and looking around her room.  Not talking.  No facial asymmetry.  Moving all extremities in a limited fashion.     ED Results / Procedures / Treatments   Labs (all labs ordered are listed, but only abnormal results are displayed) Labs Reviewed  COMPREHENSIVE METABOLIC PANEL - Abnormal; Notable for the following components:      Result Value   Calcium 8.6 (*)    Total Protein 5.5 (*)    Albumin 3.1 (*)    Total Bilirubin 1.3 (*)    All other components within normal limits  CBC WITH DIFFERENTIAL/PLATELET - Abnormal; Notable for the following components:   RBC 3.70 (*)    Hemoglobin 10.5 (*)    HCT 33.7 (*)    All other components within normal limits   URINALYSIS, ROUTINE W REFLEX MICROSCOPIC - Abnormal; Notable for the following components:   APPearance HAZY (*)    Hgb urine dipstick SMALL (*)    Nitrite POSITIVE (*)    Leukocytes,Ua LARGE (*)    WBC, UA >50 (*)    Bacteria, UA RARE (*)    All other components within normal limits  TSH - Abnormal; Notable for the following components:   TSH <0.010 (*)    All other components within normal limits  T4, FREE - Abnormal; Notable for the following components:   Free T4 1.84 (*)    All other components within normal limits  CBG MONITORING, ED - Abnormal; Notable for the following components:   Glucose-Capillary 57 (*)    All other components within normal limits  CULTURE, BLOOD (ROUTINE X 2)  CULTURE, BLOOD (ROUTINE X 2)  URINE CULTURE  SARS CORONAVIRUS 2 BY RT PCR (HOSPITAL ORDER, [facility])  LACTIC ACID, PLASMA  LACTIC ACID, PLASMA  PROTIME-INR  T3  CBC  BASIC METABOLIC PANEL  TROPONIN I (HIGH SENSITIVITY)  TROPONIN I (HIGH SENSITIVITY)    EKG None  Radiology CT Head Wo Contrast  Result Date: 06/30/2020 CLINICAL DATA:  Altered mental status EXAM: CT HEAD WITHOUT CONTRAST TECHNIQUE: Contiguous axial images were obtained from the base of the skull through the vertex without intravenous contrast. COMPARISON:  01/31/2020 FINDINGS: Brain: No evidence of acute infarction, hemorrhage, hydrocephalus, extra-axial collection or mass lesion/mass effect. Encephalomalacia related to remote infarct in the right occipital lobe. Multiple remote left basal ganglia lacunar infarcts. Extensive low-density changes within the periventricular and subcortical white matter compatible with chronic microvascular ischemic change. Mild-moderate diffuse cerebral volume loss. Vascular: Atherosclerotic calcifications involving the large vessels of the skull base. No unexpected hyperdense vessel. Skull: Normal. Negative for fracture or focal lesion. Sinuses/Orbits: Mild  left  maxillary mucosal thickening. Paranasal sinuses and mastoid air cells are otherwise clear. Orbital structures intact. Other: None. IMPRESSION: 1. No acute intracranial findings. 2. Chronic microvascular ischemic change and cerebral volume loss. Electronically Signed   By: Davina Poke D.O.   On: 06/30/2020 13:25   DG Chest Port 1 View  Result Date: 06/30/2020 CLINICAL DATA:  Unresponsive EXAM: PORTABLE CHEST 1 VIEW COMPARISON:  None. FINDINGS: Normal cardiac silhouette. Lungs are hyperinflated. No effusion, infiltrate or pneumothorax. No focal consolidation. IMPRESSION: No acute cardiopulmonary process. Electronically Signed   By: Suzy Bouchard M.D.   On: 06/30/2020 14:10    Procedures Procedures (including critical care time)  Medications Ordered in ED Medications  enoxaparin (LOVENOX) injection 30 mg (30 mg Subcutaneous Given 06/30/20 1602)  sodium chloride flush (NS) 0.9 % injection 3 mL (has no administration in time range)  ondansetron (ZOFRAN) tablet 4 mg (has no administration in time range)    Or  ondansetron (ZOFRAN) injection 4 mg (has no administration in time range)  acetaminophen (TYLENOL) tablet 650 mg (has no administration in time range)    Or  acetaminophen (TYLENOL) suppository 650 mg (has no administration in time range)  albuterol (PROVENTIL) (2.5 MG/3ML) 0.083% nebulizer solution 2.5 mg (has no administration in time range)  cefTRIAXone (ROCEPHIN) 1 g in sodium chloride 0.9 % 100 mL IVPB (has no administration in time range)  sodium chloride 0.9 % bolus 500 mL (0 mLs Intravenous Stopped 06/30/20 1549)  cefTRIAXone (ROCEPHIN) 1 g in sodium chloride 0.9 % 100 mL IVPB (0 g Intravenous Stopped 06/30/20 1405)  dextrose 50 % solution 25 mL (25 mLs Intravenous Given 06/30/20 1720)    ED Course  I have reviewed the triage vital signs and the nursing notes.  Pertinent labs & imaging results that were available during my care of the patient were reviewed by me and considered in  my medical decision making (see chart for details).  Clinical Course as of Jun 30 1720  Sat Jun 30, 2020  1235 Discussed with Dr. Tamala Julian [facility] hospitalist who will evaluate the patient for admission.   [MB]  1400 Chest x-ray interpreted by me as no acute infiltrates.   [MB]    Clinical Course User Index [MB] Hayden Rasmussen, MD   MDM Rules/Calculators/A&P                         This patient complains of lethargy altered mental status; this involves an extensive number of treatment Options and is a complaint that carries with it a high risk of complications and Morbidity. The differential includes sepsis, Sirs, UTI, encephalopathy, metabolic derangement, stroke, bleed  I ordered, reviewed and interpreted labs, which included CBC with a normal white count, low hemoglobin baseline for patient, chemistries with mildly low calcium and elevated T bili, urinalysis consistent with infection greater than 50 whites nitrite positive, lactate not elevated, blood culture and urine culture pending I ordered medication IV fluids IV ceftriaxone I ordered imaging studies which included chest x-ray and head CT and I independently    visualized and interpreted imaging which showed no acute findings Additional history obtained from EMS Previous records obtained and reviewed in epic including last PCP visit where they started her on antibiotics for possible UTI I consulted [facility] hospitalist Dr. Tamala Julian and discussed lab and imaging findings  Critical Interventions: None  After the interventions stated above, I reevaluated the patient and found patient to be still minimally interactive.  This does not sound like her baseline.  Will need admission for further management of her urinary tract infection and encephalopathy   Final Clinical Impression(s) / ED Diagnoses Final diagnoses:  Acute encephalopathy  Urinary tract infection without hematuria, site unspecified    Rx / DC Orders ED Discharge Orders     None       Hayden Rasmussen, MD 06/30/20 1727

## 2020-07-01 ENCOUNTER — Inpatient Hospital Stay (HOSPITAL_COMMUNITY): Payer: Medicare Other

## 2020-07-01 LAB — CBC
HCT: 33.1 % — ABNORMAL LOW (ref 36.0–46.0)
Hemoglobin: 10.6 g/dL — ABNORMAL LOW (ref 12.0–15.0)
MCH: 28.5 pg (ref 26.0–34.0)
MCHC: 32 g/dL (ref 30.0–36.0)
MCV: 89 fL (ref 80.0–100.0)
Platelets: 198 10*3/uL (ref 150–400)
RBC: 3.72 MIL/uL — ABNORMAL LOW (ref 3.87–5.11)
RDW: 15.3 % (ref 11.5–15.5)
WBC: 10.2 10*3/uL (ref 4.0–10.5)
nRBC: 0 % (ref 0.0–0.2)

## 2020-07-01 LAB — GLUCOSE, CAPILLARY
Glucose-Capillary: 180 mg/dL — ABNORMAL HIGH (ref 70–99)
Glucose-Capillary: 69 mg/dL — ABNORMAL LOW (ref 70–99)
Glucose-Capillary: 73 mg/dL (ref 70–99)
Glucose-Capillary: 88 mg/dL (ref 70–99)
Glucose-Capillary: 90 mg/dL (ref 70–99)

## 2020-07-01 LAB — BASIC METABOLIC PANEL
Anion gap: 14 (ref 5–15)
BUN: 22 mg/dL (ref 8–23)
CO2: 20 mmol/L — ABNORMAL LOW (ref 22–32)
Calcium: 8.7 mg/dL — ABNORMAL LOW (ref 8.9–10.3)
Chloride: 108 mmol/L (ref 98–111)
Creatinine, Ser: 1.01 mg/dL — ABNORMAL HIGH (ref 0.44–1.00)
GFR calc Af Amer: 58 mL/min — ABNORMAL LOW (ref >60)
GFR calc non Af Amer: 50 mL/min — ABNORMAL LOW (ref >60)
Glucose, Bld: 94 mg/dL (ref 70–99)
Potassium: 3.6 mmol/L (ref 3.5–5.1)
Sodium: 142 mmol/L (ref 135–145)

## 2020-07-01 MED ORDER — HALOPERIDOL LACTATE 5 MG/ML IJ SOLN
0.5000 mg | INTRAMUSCULAR | Status: DC | PRN
  Administered 2020-07-02: 0.5 mg via INTRAVENOUS
  Filled 2020-07-01: qty 1

## 2020-07-01 MED ORDER — ACETAMINOPHEN 650 MG RE SUPP: 650.0000 mg | Freq: Four times a day (QID) | RECTAL | Status: DC | PRN

## 2020-07-01 MED ORDER — GLYCOPYRROLATE 1 MG PO TABS: 1.0000 mg | ORAL_TABLET | ORAL | Status: DC | PRN

## 2020-07-01 MED ORDER — HALOPERIDOL LACTATE 2 MG/ML PO CONC
0.5000 mg | ORAL | Status: DC | PRN
  Filled 2020-07-01: qty 0.3

## 2020-07-01 MED ORDER — HALOPERIDOL 0.5 MG PO TABS: 0.5000 mg | ORAL_TABLET | ORAL | Status: DC | PRN

## 2020-07-01 MED ORDER — GLYCOPYRROLATE 0.2 MG/ML IJ SOLN: 0.2000 mg | INTRAMUSCULAR | Status: DC | PRN

## 2020-07-01 MED ORDER — DEXTROSE 50 % IV SOLN
INTRAVENOUS | Status: AC
  Administered 2020-07-01: 25 mL
  Filled 2020-07-01: qty 50

## 2020-07-01 MED ORDER — KCL-LACTATED RINGERS-D5W 20 MEQ/L IV SOLN
INTRAVENOUS | Status: DC
  Filled 2020-07-01: qty 1000

## 2020-07-01 MED ORDER — MORPHINE SULFATE (CONCENTRATE) 10 MG/0.5ML PO SOLN
5.0000 mg | ORAL | Status: DC | PRN
  Administered 2020-07-02: 5 mg via ORAL
  Filled 2020-07-01: qty 0.5

## 2020-07-01 MED ORDER — ONDANSETRON 4 MG PO TBDP: 4.0000 mg | ORAL_TABLET | Freq: Four times a day (QID) | ORAL | Status: DC | PRN

## 2020-07-01 MED ORDER — BIOTENE DRY MOUTH MT LIQD: 15.0000 mL | OROMUCOSAL | Status: DC | PRN

## 2020-07-01 MED ORDER — ONDANSETRON HCL 4 MG/2ML IJ SOLN: 4.0000 mg | Freq: Four times a day (QID) | INTRAMUSCULAR | Status: DC | PRN

## 2020-07-01 MED ORDER — POLYVINYL ALCOHOL 1.4 % OP SOLN
1.0000 [drp] | Freq: Four times a day (QID) | OPHTHALMIC | Status: DC | PRN
  Filled 2020-07-01: qty 15

## 2020-07-01 MED ORDER — ACETAMINOPHEN 325 MG PO TABS: 650.0000 mg | ORAL_TABLET | Freq: Four times a day (QID) | ORAL | Status: DC | PRN

## 2020-07-01 NOTE — TOC Initial Note (Signed)
Transition of Care [facility]) - Initial/Assessment Note    Patient Details  Name: Elizabeth [name] MRN: [id number] Date of Birth: 1931/06/28  [facility] CM/SW Contact:    Pollie Friar, RN Phone Number: 07/01/2020, 3:56 PM  Clinical Narrative:                 Pt is from home alone. Nephew is at the bedside and provided POA paperwork that CM placed in pts chart.  Consulted for residential hospice. Nephew prefers [facility] d/t pts friends live in the area. CM made referral to [place] with [facility]. They have no beds today. Crislyn will follow up tomorrow. TOC following.  Expected Discharge Plan: [place] Barriers to Discharge: Continued Medical Work up, Hospice Bed not available   Patient Goals and CMS Choice   CMS Medicare.gov Compare Post Acute Care list provided to:: Patient Represenative (must comment) Choice offered to / list presented to : [facility] POA / Guardian (Joey--nephew)  Expected Discharge Plan and Services Expected Discharge Plan: [place] In-house Referral: Clinical Social Work Discharge Planning Services: CM Consult   Living arrangements for the past 2 months: Single Family Home                                      Prior Living Arrangements/Services Living arrangements for the past 2 months: Single Family Home Lives with:: Self          Need for Family Participation in Patient Care: Yes (Comment) Care giver support system in place?: No (comment)   Criminal Activity/Legal Involvement Pertinent to Current Situation/Hospitalization: No - Comment as needed  Activities of Daily Living      Permission Sought/Granted                  Emotional Assessment Appearance:: Appears stated age         Psych Involvement: No (comment)  Admission diagnosis:  UTI (urinary tract infection) [N39.0] Acute encephalopathy [G93.40] Urinary tract infection without hematuria, site unspecified [N39.0] Patient Active  Problem List   Diagnosis Date Noted  . UTI (urinary tract infection) 06/30/2020  . Acute metabolic encephalopathy 24/07/7352  . Hypoglycemia 06/30/2020  . Bladder prolapse, female, acquired 06/30/2020  . Sleep difficulties 05/22/2020  . Radius and ulna distal fracture 04/24/2020  . Sepsis secondary to UTI ([place]) 01/31/2020  . Constipation 01/22/2019  . Malnutrition of moderate degree 12/17/2018  . Pars defect of lumbar spine 12/16/2018  . Compression fracture of L3 vertebra ([place]) 11/02/2018  . Right shoulder pain 11/02/2018  . Left groin pain 10/19/2018  . Low back pain 10/19/2018  . Osteoporosis 05/30/2018  . Poor posture 04/28/2018  . Physical deconditioning 04/28/2018  . Anxiety 03/02/2018  . Foot pain, bilateral 12/21/2017  . Bilateral leg edema 12/02/2017  . Overactive bladder 09/19/2017  . Post-polio syndrome   . Vitamin B12 deficiency   . Fall 09/11/2017  . Subdural hygroma 09/11/2017  . Swallowing disorder 03/04/2017  . Parkinson disease ([place]) 06/25/2016  . Restless leg syndrome 12/25/2015  . Arthritis 12/25/2015  . Abnormality of gait 05/24/2015  . History of CVA (cerebrovascular accident) 07/20/2012  . Cervicalgia 01/17/2009  . Irritable bowel syndrome (IBS) 01/17/2009  . Urinary incontinence 09/07/2008  . Normocytic anemia 04/26/2008  . HYPERLIPIDEMIA 01/26/2008  . History of TIA (transient ischemic attack) 01/26/2008  . Hypothyroidism 01/04/2008   PCP:  Binnie Rail, MD  Pharmacy:   [facility], [place] [address] Phone: [phone] Fax: [phone]     Social Determinants of Health (SDOH) Interventions    Readmission Risk Interventions No flowsheet data found.

## 2020-07-01 NOTE — Progress Notes (Signed)
[facility] ([facility]) Hospital Liaison note.    Received request from TOC manager for family interest in [facility]. Unfortunately, [facility] is unable to offer a room today. Hospital Liaison will follow up tomorrow or sooner if a room becomes available.    Please do not hesitate to call with questions. Thank you for the opportunity to participate in this patient's care.  Chrislyn King, BSN, RN [facility] Hospital Liaison (listed on [facility] under Hospice/[facility])    [phone]     

## 2020-07-01 NOTE — Progress Notes (Signed)
PROGRESS NOTE    Elizabeth [name]  KPT:[id number] DOB: 1931/01/29 DOA: 06/30/2020 PCP: Binnie Rail, MD      Brief Narrative:  [name] is a 86 y.o. F with Parkinson's disease on Sinemet, history CVA, and severe protein calorie malnutrition who presented with being found unresponsive.  Evidently, home care at staff came to the patient's house, and found her awake but unresponsive.  Afebrile, urinalysis with pyuria and bacteria, CT head normal.  EDP started Ceftriaxone for UTI and asked for hospitalist evaluation.       Assessment & Plan:  Acute left MCA stroke without mass effect MRI brain obtained this morning showed 3x3x4 cm left MCA stroke, consistent with the patient's massive aphasia and paralysis.  She had no known LSN, and was not a tPA candidate at the time of arrival.    Given her age, her malnutrition and frailty, and her Parkinson's disease, her prognosis is certainly that this is a terminal illness.  I spoke with nephew/POA who confirmed her wishes had been clear NOT to be kept alive by artificial means.  Under these conditions, we will start aggressive comfort measures: -Hospice referral made -q2 turns -Oral cares -As needed comfort measures to include Biotene, acetaminophen, bronchodilators, Robinul, Haldol, morphine, or Zofran  Anemia  Parkinson's disease  Severe protein calorie malnutrition As evidenced by severely decreased loss of subcutaneous muscle mass and fat, chronic illness, and poor oral intake.         Disposition: Status is: Inpatient  Remains inpatient appropriate because:She has had a massive stroke and should be made comfort measures.  Inpatient hospice bed, and expected in-hospital death.   Dispo: The patient is from: Home              Anticipated d/c is to: Home              Anticipated d/c date is: 1 day              Patient currently is not medically stable to d/c.  She suffered a massive stroke, we expect in-hospital death,  transition to residential hospice          MDM: The below labs and imaging reports were reviewed and summarized above.  Medication management as above.  CODE STATUS discussed, patient made DNR and comfort measures  DVT prophylaxis: Not applicable  Code Status: DNR Family Communication: Nephew/POA            Subjective: Unable to assess.  Nursing report that she is somewhat restless at times, but mostly resting comfortably.  One fever last night.  Nonverbal.  Objective: Vitals:   06/30/20 2016 06/30/20 2216 07/01/20 0001 07/01/20 0811  BP: (!) 129/92 119/81 128/80 (!) 128/48  Pulse: 93 100 70 81  Resp: 19 18 18 16   Temp: (!) 101.2 F (38.4 C) 99.3 F (37.4 C) 98.1 F (36.7 C) 98.9 F (37.2 C)  TempSrc: Axillary  Oral   SpO2:  96% 94%   Weight:       No intake or output data in the 24 hours ending 07/01/20 1620 Filed Weights   06/30/20 1056  Weight: 29.5 kg    Examination: General appearance: Elderly adult female, lying in bed, nonresponsive.   HEENT: Lips dry, oropharynx dry, dentition in good repair, no oral lesions, no nasal deformity, discharge, or epistaxis Skin: Warm and dry.  No jaundice.  No suspicious rashes or lesions. Cardiac: Tachycardic, regular, nl S1-S2, no murmurs appreciated.  Capillary refill is brisk.  JVP normal.  No LE edema.  Radial pulses 2+ and symmetric. Respiratory: Normal respiratory rate and rhythm.  CTAB without rales or wheezes. Abdomen: Abdomen soft.  No TTP or guarding. No ascites, distension, hepatosplenomegaly.   MSK: Cachectic, severe loss of subcutaneous muscle mass and fat Neuro: Contracted, resting tremor, stirs to painful stimuli, otherwise does not respond to questions, opening eyes, or making spontaneous verbalizations.    Psych: Able to assess.    Data Reviewed: I have personally reviewed following labs and imaging studies:  CBC: Recent Labs  Lab 06/30/20 1059 07/01/20 0131  WBC 5.1 10.2  NEUTROABS 3.4  --    HGB 10.5* 10.6*  HCT 33.7* 33.1*  MCV 91.1 89.0  PLT 166 706   Basic Metabolic Panel: Recent Labs  Lab 06/30/20 1059 07/01/20 0131  NA 142 142  K 4.2 3.6  CL 109 108  CO2 23 20*  GLUCOSE 78 94  BUN 19 22  CREATININE 0.68 1.01*  CALCIUM 8.6* 8.7*   GFR: Estimated Creatinine Clearance: 18.3 mL/min (A) (by C-G formula based on SCr of 1.01 mg/dL (H)). Liver Function Tests: Recent Labs  Lab 06/30/20 1059  AST 19  ALT 6  ALKPHOS 67  BILITOT 1.3*  PROT 5.5*  ALBUMIN 3.1*   No results for input(s): LIPASE, AMYLASE in the last 168 hours. No results for input(s): AMMONIA in the last 168 hours. Coagulation Profile: Recent Labs  Lab 06/30/20 1059  INR 1.1   Cardiac Enzymes: No results for input(s): CKTOTAL, CKMB, CKMBINDEX, TROPONINI in the last 168 hours. BNP (last 3 results) No results for input(s): PROBNP in the last 8760 hours. HbA1C: No results for input(s): HGBA1C in the last 72 hours. CBG: Recent Labs  Lab 07/01/20 0000 07/01/20 0431 07/01/20 0818 07/01/20 1203 07/01/20 1357  GLUCAP 88 90 73 69* 180*   Lipid Profile: No results for input(s): CHOL, HDL, LDLCALC, TRIG, CHOLHDL, LDLDIRECT in the last 72 hours. Thyroid Function Tests: Recent Labs    06/30/20 1215 06/30/20 1423  TSH <0.010*  --   FREET4  --  1.84*   Anemia Panel: No results for input(s): VITAMINB12, FOLATE, FERRITIN, TIBC, IRON, RETICCTPCT in the last 72 hours. Urine analysis:    Component Value Date/Time   COLORURINE YELLOW 06/30/2020 1215   APPEARANCEUR HAZY (A) 06/30/2020 1215   LABSPEC 1.009 06/30/2020 1215   PHURINE 6.0 06/30/2020 1215   GLUCOSEU NEGATIVE 06/30/2020 1215   GLUCOSEU NEGATIVE 02/14/2020 1624   HGBUR SMALL (A) 06/30/2020 1215   BILIRUBINUR NEGATIVE 06/30/2020 1215   BILIRUBINUR Negative 06/18/2020 1338   KETONESUR NEGATIVE 06/30/2020 1215   PROTEINUR NEGATIVE 06/30/2020 1215   UROBILINOGEN 0.2 06/18/2020 1338   UROBILINOGEN 0.2 02/14/2020 1624   NITRITE  POSITIVE (A) 06/30/2020 1215   LEUKOCYTESUR LARGE (A) 06/30/2020 1215   Sepsis Labs: @LABRCNTIP (procalcitonin:4,lacticacidven:4)  ) Recent Results (from the past 240 hour(s))  Culture, blood (Routine x 2)     Status: None (Preliminary result)   Collection Time: 06/30/20 10:59 AM   Specimen: BLOOD  Result Value Ref Range Status   Specimen Description BLOOD RIGHT ANTECUBITAL  Final   Special Requests   Final    BOTTLES DRAWN AEROBIC AND ANAEROBIC Blood Culture adequate volume   Culture   Final    NO GROWTH < 24 HOURS Performed at [facility], [place] [address] [place], [place] [address]    Report Status PENDING  Incomplete  Urine culture     Status: Abnormal (Preliminary result)  Collection Time: 06/30/20 11:17 AM   Specimen: Urine, Random  Result Value Ref Range Status   Specimen Description URINE, RANDOM  Final   Special Requests NONE  Final   Culture (A)  Final    >=100,000 COLONIES/mL ESCHERICHIA COLI SUSCEPTIBILITIES TO FOLLOW Performed at [facility], [address] [place], [place] [address]    Report Status PENDING  Incomplete  Culture, blood (Routine x 2)     Status: None (Preliminary result)   Collection Time: 06/30/20 12:13 PM   Specimen: BLOOD RIGHT HAND  Result Value Ref Range Status   Specimen Description BLOOD RIGHT HAND  Final   Special Requests   Final    BOTTLES DRAWN AEROBIC ONLY Blood Culture results may not be optimal due to an inadequate volume of blood received in culture bottles   Culture   Final    NO GROWTH < 24 HOURS Performed at [facility], [place] [address], [place], [place] [address]    Report Status PENDING  Incomplete  SARS Coronavirus 2 by RT PCR (hospital order, performed in [facility] hospital lab) Nasopharyngeal Nasopharyngeal Swab     Status: None   Collection Time: 06/30/20  4:00 PM   Specimen: Nasopharyngeal Swab  Result Value Ref Range Status   SARS Coronavirus 2 NEGATIVE NEGATIVE Final    Comment:  (NOTE) SARS-CoV-2 target nucleic acids are NOT DETECTED.  The SARS-CoV-2 RNA is generally detectable in upper and lower respiratory specimens during the acute phase of infection. The lowest concentration of SARS-CoV-2 viral copies this assay can detect is 250 copies / mL. A negative result does not preclude SARS-CoV-2 infection and should not be used as the sole basis for treatment or other patient management decisions.  A negative result may occur with improper specimen collection / handling, submission of specimen other than nasopharyngeal swab, presence of viral mutation(s) within the areas targeted by this assay, and inadequate number of viral copies (<250 copies / mL). A negative result must be combined with clinical observations, patient history, and epidemiological information.  Fact Sheet for Patients:   StrictlyIdeas.no  Fact Sheet for Healthcare Providers: BankingDealers.co.za  This test is not yet approved or  cleared by the [place] FDA and has been authorized for detection and/or diagnosis of SARS-CoV-2 by FDA under an Emergency Use Authorization (EUA).  This EUA will remain in effect (meaning this test can be used) for the duration of the COVID-19 declaration under Section 564(b)(1) of the Act, 21 U.S.C. section 360bbb-3(b)(1), unless the authorization is terminated or revoked sooner.  Performed at [facility], [place] [address] [place], [place] [address]          Radiology Studies: DG Abd 1 View  Result Date: 07/01/2020 CLINICAL DATA:  NG tube placement EXAM: ABDOMEN - 1 VIEW COMPARISON:  None. FINDINGS: Enteric tube terminates over the lower thoracic esophagus, recommend advancing 10 cm. No dilated small bowel loops. Moderate colonic stool. No evidence of pneumatosis or pneumoperitoneum. Clear lung bases. IMPRESSION: 1. Enteric tube terminates over the lower thoracic esophagus, recommend advancing 10 cm.  2. Nonobstructive bowel gas pattern. Moderate colonic stool, which may indicate constipation. These results will be called to the ordering clinician or representative by the Radiologist Assistant, and communication documented in the PACS or [employer]. Electronically Signed   By: Ilona Sorrel M.D.   On: 07/01/2020 10:59   CT Head Wo Contrast  Result Date: 06/30/2020 CLINICAL DATA:  Altered mental status EXAM: CT  HEAD WITHOUT CONTRAST TECHNIQUE: Contiguous axial images were obtained from the base of the skull through the vertex without intravenous contrast. COMPARISON:  01/31/2020 FINDINGS: Brain: No evidence of acute infarction, hemorrhage, hydrocephalus, extra-axial collection or mass lesion/mass effect. Encephalomalacia related to remote infarct in the right occipital lobe. Multiple remote left basal ganglia lacunar infarcts. Extensive low-density changes within the periventricular and subcortical white matter compatible with chronic microvascular ischemic change. Mild-moderate diffuse cerebral volume loss. Vascular: Atherosclerotic calcifications involving the large vessels of the skull base. No unexpected hyperdense vessel. Skull: Normal. Negative for fracture or focal lesion. Sinuses/Orbits: Mild left maxillary mucosal thickening. Paranasal sinuses and mastoid air cells are otherwise clear. Orbital structures intact. Other: None. IMPRESSION: 1. No acute intracranial findings. 2. Chronic microvascular ischemic change and cerebral volume loss. Electronically Signed   By: Davina Poke D.O.   On: 06/30/2020 13:25   MR BRAIN WO CONTRAST  Addendum Date: 07/01/2020   ADDENDUM REPORT: 07/01/2020 13:25 ADDENDUM: These results were called by telephone at the time of interpretation on 07/01/2020 at 1:24 pm to provider [facility] , who verbally acknowledged these results. Electronically Signed   By: Kellie Simmering DO   On: 07/01/2020 13:25   Result Date: 07/01/2020 CLINICAL DATA:  Mental status  change, unknown cause. EXAM: MRI HEAD WITHOUT CONTRAST TECHNIQUE: Multiplanar, multiecho pulse sequences of the brain and surrounding structures were obtained without intravenous contrast. COMPARISON:  Prior head CT examinations 06/30/2020 and earlier, brain MRI 04/20/2008 FINDINGS: Brain: Mild generalized parenchymal atrophy. There is a 3.7 x 3.7 x 4.3 cm region of cortical/subcortical restricted diffusion consistent with acute infarction within the left MCA vascular territory. This infarct is centered within the left frontal operculum and left insula. Corresponding T2/FLAIR hyperintensity at this site. Redemonstrated chronic cortically based infarct within the right occipital lobe. Background moderate patchy T2/FLAIR hyperintensity within the cerebral white matter and pons which is nonspecific, but consistent with chronic small vessel ischemic disease. Chronic lacunar infarcts within the basal ganglia bilaterally. No evidence of intracranial mass. No chronic intracranial blood products. No extra-axial fluid collection. No midline shift. Vascular: No definite loss of expected proximal large arterial flow voids. Skull and upper cervical spine: No focal marrow lesion. Sinuses/Orbits: Visualized orbits show no acute finding. Mild ethmoid sinus mucosal thickening. No significant mastoid effusion. IMPRESSION: 3.7 x 3.7 x 4.3 cm left MCA vascular territory acute infarct centered within the left frontal operculum and left insula. No significant mass effect at this time. No midline shift. Redemonstrated chronic cortically-based right occipital lobe infarct. Background mild generalized parenchymal atrophy and moderate chronic small vessel ischemic disease. Redemonstrated chronic bilateral basal ganglia lacunar infarcts. Mild ethmoid sinus mucosal thickening. Electronically Signed: By: Kellie Simmering DO On: 07/01/2020 11:40   DG Chest Port 1 View  Result Date: 06/30/2020 CLINICAL DATA:  Unresponsive EXAM: PORTABLE CHEST 1  VIEW COMPARISON:  None. FINDINGS: Normal cardiac silhouette. Lungs are hyperinflated. No effusion, infiltrate or pneumothorax. No focal consolidation. IMPRESSION: No acute cardiopulmonary process. Electronically Signed   By: Suzy Bouchard M.D.   On: 06/30/2020 14:10        Scheduled Meds: . sodium chloride flush  3 mL Intravenous Q12H   Continuous Infusions:   LOS: 1 day    Time spent: 35 minutes    Edwin Dada, MD [facility] 07/01/2020, 4:20 PM     Please page though [place] or Epic secure chat:  For [employer], Adult nurse

## 2020-07-01 NOTE — Progress Notes (Signed)
Patient asleep but restless. She is pulling at gown and has removed her IV. She responds to touch and voice. No signs of acute distress noted.

## 2020-07-02 ENCOUNTER — Other Ambulatory Visit: Payer: Self-pay

## 2020-07-02 LAB — URINE CULTURE: Culture: >=100000 — AB

## 2020-07-02 NOTE — Plan of Care (Signed)
Discussed in front of patient with family plan of care, pain management and comfort care with some teach back displayed.

## 2020-07-02 NOTE — Progress Notes (Signed)
Manufacturing engineer [facility]) Hospital Liaison note.    Regretfully,  [facility] is unable to offer a room today. Hospital Liaison will follow up tomorrow or sooner if a room becomes available.    Please do not hesitate to call with questions.    Thank you for the opportunity to participate in this patients care.  Chrislyn Edison Pace, BSN, RN [place] (listed on [place] under Hospice[facility])    [phone]

## 2020-07-02 NOTE — Plan of Care (Signed)
Discussed with patient family plan of care for the evening, pain management and comfort care with some teach back displayed

## 2020-07-02 NOTE — Progress Notes (Signed)
PROGRESS NOTE    Elizabeth [name]  QBH:[id number] DOB: January 04, 1931 DOA: 06/30/2020 PCP: Binnie Rail, MD      Brief Narrative:  Elizabeth [name] is a 86 y.o. F with Parkinson's disease on Sinemet, history CVA, and severe protein calorie malnutrition who presented with being found unresponsive.  Evidently, home care at staff came to the patient's house, and found her awake but unresponsive.  Afebrile, urinalysis with pyuria and bacteria, CT head normal.  EDP started Ceftriaxone for UTI and asked for hospitalist evaluation.  MRI ordered morning after admission showed large left MCA stroke.            Assessment & Plan:  Acute left MCA stroke without mass effect Patient was admitted.  Overnight, had persistent encephalopathy.  Due to history stroke, and abrupt onset symptoms, stroke suspected.    MRI brain obtained showed 3x3x4 cm left MCA stroke, consistent with the patient's massive aphasia and paralysis.  She had no known LSN, and was not a tPA candidate at the time of arrival.    Given her age, her malnutrition and frailty, and her Parkinson's disease, her prognosis is certainly that this is a terminal illness without hope of meaningful recovery and independence.  I spoke with nephew/POA who confirmed her wishes had been clear NOT to be kept alive by artificial means.  Under these conditions, we will start aggressive comfort measures and residential Hospice placement.  Nursing report some grimace and pain indicators today, treated with morphine. -Hospice referral made -q2 turns -Oral cares -As needed comfort measures to include Biotene, acetaminophen, bronchodilators, Robinul, Haldol, morphine, or Zofran  Anemia  Parkinson's disease  Severe protein calorie malnutrition As evidenced by severely decreased loss of subcutaneous muscle mass and fat, chronic illness, and poor oral intake.         Disposition: Status is: Inpatient  Remains inpatient appropriate because:She  has had a massive stroke and should be made comfort measures.  Patient is actively dying.  We await a residential hospice bed, and expected in-hospital death.   Dispo: The patient is from: Home              Anticipated d/c is to: Residential hospice              Anticipated d/c date is: 1 day              Patient currently is not medically stable to d/c.  She has suffered a massive stroke, we expect in-hospital death, transition to residential hospice          MDM: The below labs and imaging reports were reviewed and summarized above.  Medication management as above.   DVT prophylaxis: Not applicable  Code Status: DNR Family Communication: Nephew/POA yesterday            Subjective: Patient grimacing at times.  No fever, no respiratory distress, no cough.  No improvement in lethargy.  Objective: Vitals:   07/02/20 0235 07/02/20 0331 07/02/20 0812 07/02/20 1553  BP:   (!) 142/78 131/78  Pulse:   91 89  Resp: 16 15 18 18   Temp:   98.1 F (36.7 C) 98 F (36.7 C)  TempSrc:   Axillary Oral  SpO2:   99% 94%  Weight:      Height:        Intake/Output Summary (Last 24 hours) at 07/02/2020 1812 Last data filed at 07/01/2020 2058 Gross per 24 hour  Intake 10 ml  Output --  Net 10  ml   Filed Weights   06/30/20 1056 07/01/20 2144  Weight: 29.5 kg 33.2 kg    Examination: General appearance: Elderly adult female, lying in bed, nonresponsive.   HEENT: Lips dry, oropharynx dry, dentition in good repair, no oral lesions, no nasal deformity, discharge, or epistaxis.  Lips dry, OP dry. Skin: Warm and dry.  No jaundice.  No suspicious rashes or lesions. Cardiac: Tachycardic, regular, nl S1-S2, no murmurs appreciated.  Capillary refill is brisk.  JVP normal.  No LE edema.  Radial pulses 2+ and symmetric. Respiratory: Normal respiratory rate and rhythm.  CTAB without rales or wheezes. Abdomen: Abdomen soft.  No TTP or guarding. No ascites, distension, hepatosplenomegaly.    MSK: Cachectic, severe loss of subcutaneous muscle mass and fat Neuro: Contracted, resting tremor, stirs to painful stimuli, otherwise does not respond to questions, opening eyes, or making spontaneous verbalizations.    Psych: Able to assess.    Data Reviewed: I have personally reviewed following labs and imaging studies:  CBC: Recent Labs  Lab 06/30/20 1059 07/01/20 0131  WBC 5.1 10.2  NEUTROABS 3.4  --   HGB 10.5* 10.6*  HCT 33.7* 33.1*  MCV 91.1 89.0  PLT 166 809   Basic Metabolic Panel: Recent Labs  Lab 06/30/20 1059 07/01/20 0131  NA 142 142  K 4.2 3.6  CL 109 108  CO2 23 20*  GLUCOSE 78 94  BUN 19 22  CREATININE 0.68 1.01*  CALCIUM 8.6* 8.7*   GFR: Estimated Creatinine Clearance: 20.6 mL/min (A) (by C-G formula based on SCr of 1.01 mg/dL (H)). Liver Function Tests: Recent Labs  Lab 06/30/20 1059  AST 19  ALT 6  ALKPHOS 67  BILITOT 1.3*  PROT 5.5*  ALBUMIN 3.1*   No results for input(s): LIPASE, AMYLASE in the last 168 hours. No results for input(s): AMMONIA in the last 168 hours. Coagulation Profile: Recent Labs  Lab 06/30/20 1059  INR 1.1   Cardiac Enzymes: No results for input(s): CKTOTAL, CKMB, CKMBINDEX, TROPONINI in the last 168 hours. BNP (last 3 results) No results for input(s): PROBNP in the last 8760 hours. HbA1C: No results for input(s): HGBA1C in the last 72 hours. CBG: Recent Labs  Lab 07/01/20 0000 07/01/20 0431 07/01/20 0818 07/01/20 1203 07/01/20 1357  GLUCAP 88 90 73 69* 180*   Lipid Profile: No results for input(s): CHOL, HDL, LDLCALC, TRIG, CHOLHDL, LDLDIRECT in the last 72 hours. Thyroid Function Tests: Recent Labs    06/30/20 1215 06/30/20 1423  TSH <0.010*  --   FREET4  --  1.84*   Anemia Panel: No results for input(s): VITAMINB12, FOLATE, FERRITIN, TIBC, IRON, RETICCTPCT in the last 72 hours. Urine analysis:    Component Value Date/Time   COLORURINE YELLOW 06/30/2020 1215   APPEARANCEUR HAZY (A)  06/30/2020 1215   LABSPEC 1.009 06/30/2020 1215   PHURINE 6.0 06/30/2020 1215   GLUCOSEU NEGATIVE 06/30/2020 1215   GLUCOSEU NEGATIVE 02/14/2020 1624   HGBUR SMALL (A) 06/30/2020 1215   BILIRUBINUR NEGATIVE 06/30/2020 1215   BILIRUBINUR Negative 06/18/2020 1338   KETONESUR NEGATIVE 06/30/2020 1215   PROTEINUR NEGATIVE 06/30/2020 1215   UROBILINOGEN 0.2 06/18/2020 1338   UROBILINOGEN 0.2 02/14/2020 1624   NITRITE POSITIVE (A) 06/30/2020 1215   LEUKOCYTESUR LARGE (A) 06/30/2020 1215   Sepsis Labs: @LABRCNTIP (procalcitonin:4,lacticacidven:4)  ) Recent Results (from the past 240 hour(s))  Culture, blood (Routine x 2)     Status: None (Preliminary result)   Collection Time: 06/30/20 10:59 AM   Specimen:  BLOOD  Result Value Ref Range Status   Specimen Description BLOOD RIGHT ANTECUBITAL  Final   Special Requests   Final    BOTTLES DRAWN AEROBIC AND ANAEROBIC Blood Culture adequate volume   Culture   Final    NO GROWTH 2 DAYS Performed at [facility], [address], [place], [place] [address]    Report Status PENDING  Incomplete  Urine culture     Status: Abnormal   Collection Time: 06/30/20 11:17 AM   Specimen: Urine, Random  Result Value Ref Range Status   Specimen Description URINE, RANDOM  Final   Special Requests   Final    NONE Performed at [facility], [place] [address], [place], [place], [place] [address]    Culture >=100,000 COLONIES/mL ESCHERICHIA COLI (A)  Final   Report Status 07/02/2020 FINAL  Final   Organism ID, Bacteria ESCHERICHIA COLI (A)  Final      Susceptibility   Escherichia coli - MIC*    AMPICILLIN 8 SENSITIVE Sensitive     CEFAZOLIN <=4 SENSITIVE Sensitive     CEFTRIAXONE <=0.25 SENSITIVE Sensitive     CIPROFLOXACIN <=0.25 SENSITIVE Sensitive     GENTAMICIN <=1 SENSITIVE Sensitive     IMIPENEM <=0.25 SENSITIVE Sensitive     NITROFURANTOIN <=16 SENSITIVE Sensitive     TRIMETH/SULFA <=20 SENSITIVE Sensitive     AMPICILLIN/SULBACTAM 4 SENSITIVE  Sensitive     PIP/TAZO <=4 SENSITIVE Sensitive     * >=100,000 COLONIES/mL ESCHERICHIA COLI  Culture, blood (Routine x 2)     Status: None (Preliminary result)   Collection Time: 06/30/20 12:13 PM   Specimen: BLOOD RIGHT HAND  Result Value Ref Range Status   Specimen Description BLOOD RIGHT HAND  Final   Special Requests   Final    BOTTLES DRAWN AEROBIC ONLY Blood Culture results may not be optimal due to an inadequate volume of blood received in culture bottles   Culture   Final    NO GROWTH 2 DAYS Performed at [facility], [address], [place], [place] [address]    Report Status PENDING  Incomplete  SARS Coronavirus 2 by RT PCR (hospital order, performed in [facility] hospital lab) Nasopharyngeal Nasopharyngeal Swab     Status: None   Collection Time: 06/30/20  4:00 PM   Specimen: Nasopharyngeal Swab  Result Value Ref Range Status   SARS Coronavirus 2 NEGATIVE NEGATIVE Final    Comment: (NOTE) SARS-CoV-2 target nucleic acids are NOT DETECTED.  The SARS-CoV-2 RNA is generally detectable in upper and lower respiratory specimens during the acute phase of infection. The lowest concentration of SARS-CoV-2 viral copies this assay can detect is 250 copies / mL. A negative result does not preclude SARS-CoV-2 infection and should not be used as the sole basis for treatment or other patient management decisions.  A negative result may occur with improper specimen collection / handling, submission of specimen other than nasopharyngeal swab, presence of viral mutation(s) within the areas targeted by this assay, and inadequate number of viral copies (<250 copies / mL). A negative result must be combined with clinical observations, patient history, and epidemiological information.  Fact Sheet for Patients:   StrictlyIdeas.no  Fact Sheet for Healthcare Providers: BankingDealers.co.za  This test is not yet approved or  cleared by the  [place] FDA and has been authorized for detection and/or diagnosis of SARS-CoV-2 by FDA under an Emergency Use Authorization (EUA).  This EUA will remain in effect (meaning this  test can be used) for the duration of the COVID-19 declaration under Section 564(b)(1) of the Act, 21 U.S.C. section 360bbb-3(b)(1), unless the authorization is terminated or revoked sooner.  Performed at [facility], [place] [address], [place], [place] [address]          Radiology Studies: DG Abd 1 View  Result Date: 07/01/2020 CLINICAL DATA:  NG tube placement EXAM: ABDOMEN - 1 VIEW COMPARISON:  None. FINDINGS: Enteric tube terminates over the lower thoracic esophagus, recommend advancing 10 cm. No dilated small bowel loops. Moderate colonic stool. No evidence of pneumatosis or pneumoperitoneum. Clear lung bases. IMPRESSION: 1. Enteric tube terminates over the lower thoracic esophagus, recommend advancing 10 cm. 2. Nonobstructive bowel gas pattern. Moderate colonic stool, which may indicate constipation. These results will be called to the ordering clinician or representative by the Radiologist Assistant, and communication documented in the PACS or [employer]. Electronically Signed   By: Ilona Sorrel M.D.   On: 07/01/2020 10:59   MR BRAIN WO CONTRAST  Addendum Date: 07/01/2020   ADDENDUM REPORT: 07/01/2020 13:25 ADDENDUM: These results were called by telephone at the time of interpretation on 07/01/2020 at 1:24 pm to provider [facility] , who verbally acknowledged these results. Electronically Signed   By: Kellie Simmering DO   On: 07/01/2020 13:25   Result Date: 07/01/2020 CLINICAL DATA:  Mental status change, unknown cause. EXAM: MRI HEAD WITHOUT CONTRAST TECHNIQUE: Multiplanar, multiecho pulse sequences of the brain and surrounding structures were obtained without intravenous contrast. COMPARISON:  Prior head CT examinations 06/30/2020 and earlier, brain MRI 04/20/2008 FINDINGS: Brain: Mild  generalized parenchymal atrophy. There is a 3.7 x 3.7 x 4.3 cm region of cortical/subcortical restricted diffusion consistent with acute infarction within the left MCA vascular territory. This infarct is centered within the left frontal operculum and left insula. Corresponding T2/FLAIR hyperintensity at this site. Redemonstrated chronic cortically based infarct within the right occipital lobe. Background moderate patchy T2/FLAIR hyperintensity within the cerebral white matter and pons which is nonspecific, but consistent with chronic small vessel ischemic disease. Chronic lacunar infarcts within the basal ganglia bilaterally. No evidence of intracranial mass. No chronic intracranial blood products. No extra-axial fluid collection. No midline shift. Vascular: No definite loss of expected proximal large arterial flow voids. Skull and upper cervical spine: No focal marrow lesion. Sinuses/Orbits: Visualized orbits show no acute finding. Mild ethmoid sinus mucosal thickening. No significant mastoid effusion. IMPRESSION: 3.7 x 3.7 x 4.3 cm left MCA vascular territory acute infarct centered within the left frontal operculum and left insula. No significant mass effect at this time. No midline shift. Redemonstrated chronic cortically-based right occipital lobe infarct. Background mild generalized parenchymal atrophy and moderate chronic small vessel ischemic disease. Redemonstrated chronic bilateral basal ganglia lacunar infarcts. Mild ethmoid sinus mucosal thickening. Electronically Signed: By: Kellie Simmering DO On: 07/01/2020 11:40        Scheduled Meds: . sodium chloride flush  3 mL Intravenous Q12H   Continuous Infusions:   LOS: 2 days    Time spent: 25 minutes    Edwin Dada, MD [facility] 07/02/2020, 6:12 PM     Please page though [place] or Epic secure chat:  For [employer], Adult nurse

## 2020-07-03 LAB — T3: T3, Total: 112 ng/dL (ref 71–180)

## 2020-07-03 NOTE — Care Management Important Message (Signed)
Important Message  Patient Details  Name: Elizabeth [name] MRN: [id number] Date of Birth: 04-Jun-1931   Medicare Important Message Given:  Yes     Orbie Pyo 07/03/2020, 2:04 PM

## 2020-07-03 NOTE — TOC Progression Note (Addendum)
Transition of Care [facility]) - Progression Note    Patient Details  Name: Elizabeth [name] MRN: [id number] Date of Birth: 1931-03-31  Transition of Care [facility]) CM/SW Contact  Joanne Chars, LCSW Phone Number: 07/03/2020, 9:34 AM  Clinical Narrative:   CSW spoke with Melissa at [facility] place beds today and pt is 3rd in line for next available bed. CSW spoke with Cherie at [facility] and she does have available bed today.  CSW spoke with pt nephew Heron Sabins and updated him, he would like to pursue the [facility] bed.  Cherie to begin process.  1200: Cherie called with update that they had emergency admit and cannot offer bed today.  CSW uipdated family.    Expected Discharge Plan: [place] Barriers to Discharge: Continued Medical Work up, Hospice Bed not available  Expected Discharge Plan and Services Expected Discharge Plan: [place] In-house Referral: Clinical Social Work Discharge Planning Services: CM Consult   Living arrangements for the past 2 months: Single Family Home                                       Social Determinants of Health (SDOH) Interventions    Readmission Risk Interventions No flowsheet data found.

## 2020-07-03 NOTE — Progress Notes (Signed)
Manufacturing engineer (ACC)  Per [facility] CM/SW [facility]. ACC will update team and discharge from our care at this time.   Thank you Clementeen Hoof, RN, [facility] [phone]

## 2020-07-03 NOTE — Progress Notes (Signed)
PROGRESS NOTE    Elizabeth [name]  BJY:[id number]  DOB: Dec 28, 1930  DOA: 06/30/2020 PCP: Binnie Rail, MD Outpatient Specialists:   Hospital course:  86 year old with Parkinson's disease, history of CVA and severe protein calorie malnutrition was sent from nursing home for being unresponsive.  Work-up revealed a large MCA stroke.  Patient is presently on palliative care for comfort care only.  She is awaiting transfer to inpatient hospice.   Subjective:  Patient opens her eyes but has no complaints.  She has her attentive friend at bedside.   Objective: Vitals:   07/03/20 0339 07/03/20 0448 07/03/20 0600 07/03/20 0751  BP:    (!) 117/49  Pulse:    100  Resp: 16 14 14 15   Temp:    99.1 F (37.3 C)  TempSrc:      SpO2:    96%  Weight:      Height:        Intake/Output Summary (Last 24 hours) at 07/03/2020 1823 Last data filed at 07/02/2020 2015 Gross per 24 hour  Intake 10 ml  Output --  Net 10 ml   Filed Weights   06/30/20 1056 07/01/20 2144  Weight: 29.5 kg 33.2 kg     Exam:  General: Frail thin ill emaciated female lying in bed in no acute distress with her friend at bedside.   Assessment & Plan:   Comfort care And is awaiting transfer to hospice She appears comfortable Comfort medications including Biotene, Tylenol, bronchodilators, Robinul, Haldol, morphine and Zofran.   From Dr. Sabino Niemann note: Acute left MCA stroke without mass effect Patient was admitted.  Overnight, had persistent encephalopathy.  Due to history stroke, and abrupt onset symptoms, stroke suspected.    MRI brain obtained showed 3x3x4 cm left MCA stroke, consistent with the patient's massive aphasia and paralysis.  She had no known LSN, and was not a tPA candidate at the time of arrival.    Given her age, her malnutrition and frailty, and her Parkinson's disease, her prognosis is certainly that this is a terminal illness without hope of meaningful recovery and independence.  I  spoke with nephew/POA who confirmed her wishes had been clear NOT to be kept alive by artificial means.  Under these conditions, we will start aggressive comfort measures and residential Hospice placement.   DVT prophylaxis: N/A Code Status: DNR Family Communication: Dr. Loleta Books has been in contact with patient's nephew/POA Disposition Plan:   Patient is from: SNF  Anticipated Discharge Location: Hospice  Barriers to Discharge: Awaiting bed  Is patient medically stable for Discharge: Yes   Consultants:  Neurology  Procedures:  None  Antimicrobials:  None   Data Reviewed:  Basic Metabolic Panel: Recent Labs  Lab 06/30/20 1059 07/01/20 0131  NA 142 142  K 4.2 3.6  CL 109 108  CO2 23 20*  GLUCOSE 78 94  BUN 19 22  CREATININE 0.68 1.01*  CALCIUM 8.6* 8.7*   Liver Function Tests: Recent Labs  Lab 06/30/20 1059  AST 19  ALT 6  ALKPHOS 67  BILITOT 1.3*  PROT 5.5*  ALBUMIN 3.1*   No results for input(s): LIPASE, AMYLASE in the last 168 hours. No results for input(s): AMMONIA in the last 168 hours. CBC: Recent Labs  Lab 06/30/20 1059 07/01/20 0131  WBC 5.1 10.2  NEUTROABS 3.4  --   HGB 10.5* 10.6*  HCT 33.7* 33.1*  MCV 91.1 89.0  PLT 166 198   Cardiac Enzymes: No results for input(s): CKTOTAL,  CKMB, CKMBINDEX, TROPONINI in the last 168 hours. BNP (last 3 results) No results for input(s): PROBNP in the last 8760 hours. CBG: Recent Labs  Lab 07/01/20 0000 07/01/20 0431 07/01/20 0818 07/01/20 1203 07/01/20 1357  GLUCAP 88 90 73 69* 180*    Recent Results (from the past 240 hour(s))  Culture, blood (Routine x 2)     Status: None (Preliminary result)   Collection Time: 06/30/20 10:59 AM   Specimen: BLOOD  Result Value Ref Range Status   Specimen Description BLOOD RIGHT ANTECUBITAL  Final   Special Requests   Final    BOTTLES DRAWN AEROBIC AND ANAEROBIC Blood Culture adequate volume   Culture   Final    NO GROWTH 3 DAYS Performed at  [facility], [address] [place], [place] [address]    Report Status PENDING  Incomplete  Urine culture     Status: Abnormal   Collection Time: 06/30/20 11:17 AM   Specimen: Urine, Random  Result Value Ref Range Status   Specimen Description URINE, RANDOM  Final   Special Requests   Final    NONE Performed at [facility], [place] [address], [place], [place] [address]    Culture >=100,000 COLONIES/mL ESCHERICHIA COLI (A)  Final   Report Status 07/02/2020 FINAL  Final   Organism ID, Bacteria ESCHERICHIA COLI (A)  Final      Susceptibility   Escherichia coli - MIC*    AMPICILLIN 8 SENSITIVE Sensitive     CEFAZOLIN <=4 SENSITIVE Sensitive     CEFTRIAXONE <=0.25 SENSITIVE Sensitive     CIPROFLOXACIN <=0.25 SENSITIVE Sensitive     GENTAMICIN <=1 SENSITIVE Sensitive     IMIPENEM <=0.25 SENSITIVE Sensitive     NITROFURANTOIN <=16 SENSITIVE Sensitive     TRIMETH/SULFA <=20 SENSITIVE Sensitive     AMPICILLIN/SULBACTAM 4 SENSITIVE Sensitive     PIP/TAZO <=4 SENSITIVE Sensitive     * >=100,000 COLONIES/mL ESCHERICHIA COLI  Culture, blood (Routine x 2)     Status: None (Preliminary result)   Collection Time: 06/30/20 12:13 PM   Specimen: BLOOD RIGHT HAND  Result Value Ref Range Status   Specimen Description BLOOD RIGHT HAND  Final   Special Requests   Final    BOTTLES DRAWN AEROBIC ONLY Blood Culture results may not be optimal due to an inadequate volume of blood received in culture bottles   Culture   Final    NO GROWTH 3 DAYS Performed at [facility], [address], [place], [place] [address]    Report Status PENDING  Incomplete  SARS Coronavirus 2 by RT PCR (hospital order, performed in [facility] hospital lab) Nasopharyngeal Nasopharyngeal Swab     Status: None   Collection Time: 06/30/20  4:00 PM   Specimen: Nasopharyngeal Swab  Result Value Ref Range Status   SARS Coronavirus 2 NEGATIVE NEGATIVE Final    Comment: (NOTE) SARS-CoV-2 target nucleic acids  are NOT DETECTED.  The SARS-CoV-2 RNA is generally detectable in upper and lower respiratory specimens during the acute phase of infection. The lowest concentration of SARS-CoV-2 viral copies this assay can detect is 250 copies / mL. A negative result does not preclude SARS-CoV-2 infection and should not be used as the sole basis for treatment or other patient management decisions.  A negative result may occur with improper specimen collection / handling, submission of specimen other than nasopharyngeal swab, presence of viral mutation(s) within the areas targeted by this assay, and inadequate number  of viral copies (<250 copies / mL). A negative result must be combined with clinical observations, patient history, and epidemiological information.  Fact Sheet for Patients:   StrictlyIdeas.no  Fact Sheet for Healthcare Providers: BankingDealers.co.za  This test is not yet approved or  cleared by the [place] FDA and has been authorized for detection and/or diagnosis of SARS-CoV-2 by FDA under an Emergency Use Authorization (EUA).  This EUA will remain in effect (meaning this test can be used) for the duration of the COVID-19 declaration under Section 564(b)(1) of the Act, 21 U.S.C. section 360bbb-3(b)(1), unless the authorization is terminated or revoked sooner.  Performed at [facility], [place] [address], [place], [place] [address]       Studies: No results found.   Scheduled Meds: . sodium chloride flush  3 mL Intravenous Q12H   Continuous Infusions:  Principal Problem:   Acute metabolic encephalopathy Active Problems:   Hypothyroidism   Normocytic anemia   History of CVA (cerebrovascular accident)   Parkinson disease ([place])   UTI (urinary tract infection)   Hypoglycemia   Bladder prolapse, female, acquired     Vashti Hey, [facility]  If 7PM-7AM, please contact  night-coverage [email or web address] Password [facility] 07/03/2020, 6:23 PM    LOS: 3 days

## 2020-07-04 DIAGNOSIS — Z66 Do not resuscitate: Secondary | ICD-10-CM

## 2020-07-04 DIAGNOSIS — Z515 Encounter for palliative care: Secondary | ICD-10-CM

## 2020-07-04 MED ORDER — GLYCOPYRROLATE 1 MG/5ML PO SOLN
1.0000 mg | Freq: Four times a day (QID) | ORAL | 0 refills | Status: AC | PRN
Start: 2020-07-04 — End: 2020-07-07

## 2020-07-04 MED ORDER — MORPHINE SULFATE (CONCENTRATE) 10 MG /0.5 ML PO SOLN: 10.0000 mg | ORAL | 0 refills | Status: AC | PRN

## 2020-07-04 MED ORDER — LORAZEPAM 0.5 MG PO TABS: 0.5000 mg | ORAL_TABLET | ORAL | 0 refills | Status: AC | PRN

## 2020-07-04 MED ORDER — ONDANSETRON 4 MG PO TBDP: 4.0000 mg | ORAL_TABLET | Freq: Three times a day (TID) | ORAL | 0 refills | Status: AC | PRN

## 2020-07-04 NOTE — Progress Notes (Signed)
[facility]: [employer]  Referral received yesterday for inpt facility in [place]. We did not have a bed to offer at that time and unfortunately do not have one today at this time to offer. I have updated the SW Marya Amsler and will reach out to family and Marya Amsler once we have availability.   [facility] is pt's first choice and if bed opened up there first we would honor the pt's choice. Webb Silversmith RN [phone]

## 2020-07-04 NOTE — Progress Notes (Signed)
PROGRESS NOTE  Elizabeth [name] BZJ:[id number] DOB: 08/08/1931   PCP: Binnie Rail, MD  Patient is from: SNF  DOA: 06/30/2020 LOS: 4  Brief Narrative / Interim history: 86 year old with Parkinson's disease, history of CVA and severe protein calorie malnutrition was sent from nursing home for being unresponsive.  Work-up revealed a large MCA stroke.  Patient is presently on palliative care for comfort care only.  She is awaiting transfer to inpatient hospice.  Subjective: No major events overnight of this morning.  Comfortable.  Opens his eyes in response to voice but doesn't hold conversation.  Objective: Vitals:   07/03/20 0600 07/03/20 0751 07/03/20 1955 07/04/20 0732  BP:  (!) 117/49 (!) 160/60 (!) 152/83  Pulse:  100 93 95  Resp: 14 15 18 16   Temp:  99.1 F (37.3 C) 99.9 F (37.7 C) 98.8 F (37.1 C)  TempSrc:   Axillary   SpO2:  96% 99% 95%  Weight:      Height:       No intake or output data in the 24 hours ending 07/04/20 1923 Filed Weights   06/30/20 1056 07/01/20 2144  Weight: 29.5 kg 33.2 kg    Examination:  GENERAL: Frail looking elderly female.  No distress. RESP:  No IWOB.   NEURO: Sleepy but wakes to voice. PSYCH: Calm.  No distress or agitation.  Procedures:  None  Microbiology summarized: COVID-19 PCR negative. Urine culture with pansensitive E. Coli. Blood cultures NGTD.  Assessment & Plan: End-of-life care/comfort care only/DNR/DNI-initiated by previous attending after discussion with patient's nephew/HC POA -Comfort care pathway -Waiting for residential hospice.  Acute left MCA stroke without mass effect: MRI brain obtained showed 3x3x4 cm left MCA stroke, consistent with the patient's massive aphasia and paralysis. She sas not a tPA candidate at the time of arrival.  -Comfort pathway as above  Bacteriuria/possible E. coli UTI  Parkinson's disease  Hypothyroidism  Debility/physical deconditioning  Severe protein calorie  malnutrition/failure to thrive Body mass index is 16.41 kg/m.         DVT prophylaxis:    Code Status: DNR/DNI Family Communication: None at bedside Status is: Inpatient  Remains inpatient appropriate because:Unsafe d/c plan   Dispo: The patient is from: SNF              Anticipated d/c is to: Residential hospice              Anticipated d/c date is: 1 day              Patient currently is medically stable to d/c.       Consultants:  Neurology Palliative medicine   Sch Meds:  Scheduled Meds:  sodium chloride flush  3 mL Intravenous Q12H   Continuous Infusions: PRN Meds:.acetaminophen **OR** acetaminophen, albuterol, antiseptic oral rinse, glycopyrrolate **OR** glycopyrrolate **OR** glycopyrrolate, haloperidol **OR** haloperidol **OR** haloperidol lactate, lidocaine, morphine CONCENTRATE, ondansetron **OR** ondansetron (ZOFRAN) IV, polyvinyl alcohol  Antimicrobials: Anti-infectives (From admission, onward)   Start     Dose/Rate Route Frequency Ordered Stop   07/01/20 1200  cefTRIAXone (ROCEPHIN) 1 g in sodium chloride 0.9 % 100 mL IVPB  Status:  Discontinued        1 g 200 mL/hr over 30 Minutes Intravenous Every 24 hours 06/30/20 1445 06/30/20 1723   06/30/20 1800  piperacillin-tazobactam (ZOSYN) IVPB 3.375 g  Status:  Discontinued        3.375 g 12.5 mL/hr over 240 Minutes Intravenous Every 8 hours 06/30/20 1744 07/01/20 1358  06/30/20 1230  cefTRIAXone (ROCEPHIN) 1 g in sodium chloride 0.9 % 100 mL IVPB        1 g 200 mL/hr over 30 Minutes Intravenous  Once 06/30/20 1228 06/30/20 1405       I have personally reviewed the following labs and images: CBC: Recent Labs  Lab 06/30/20 1059 07/01/20 0131  WBC 5.1 10.2  NEUTROABS 3.4  --   HGB 10.5* 10.6*  HCT 33.7* 33.1*  MCV 91.1 89.0  PLT 166 198   BMP &GFR Recent Labs  Lab 06/30/20 1059 07/01/20 0131  NA 142 142  K 4.2 3.6  CL 109 108  CO2 23 20*  GLUCOSE 78 94  BUN 19 22  CREATININE 0.68  1.01*  CALCIUM 8.6* 8.7*   Estimated Creatinine Clearance: 20.6 mL/min (A) (by C-G formula based on SCr of 1.01 mg/dL (H)). Liver & Pancreas: Recent Labs  Lab 06/30/20 1059  AST 19  ALT 6  ALKPHOS 67  BILITOT 1.3*  PROT 5.5*  ALBUMIN 3.1*   No results for input(s): LIPASE, AMYLASE in the last 168 hours. No results for input(s): AMMONIA in the last 168 hours. Diabetic: No results for input(s): HGBA1C in the last 72 hours. Recent Labs  Lab 07/01/20 0000 07/01/20 0431 07/01/20 0818 07/01/20 1203 07/01/20 1357  GLUCAP 88 90 73 69* 180*   Cardiac Enzymes: No results for input(s): CKTOTAL, CKMB, CKMBINDEX, TROPONINI in the last 168 hours. No results for input(s): PROBNP in the last 8760 hours. Coagulation Profile: Recent Labs  Lab 06/30/20 1059  INR 1.1   Thyroid Function Tests: No results for input(s): TSH, T4TOTAL, FREET4, T3FREE, THYROIDAB in the last 72 hours. Lipid Profile: No results for input(s): CHOL, HDL, LDLCALC, TRIG, CHOLHDL, LDLDIRECT in the last 72 hours. Anemia Panel: No results for input(s): VITAMINB12, FOLATE, FERRITIN, TIBC, IRON, RETICCTPCT in the last 72 hours. Urine analysis:    Component Value Date/Time   COLORURINE YELLOW 06/30/2020 1215   APPEARANCEUR HAZY (A) 06/30/2020 1215   LABSPEC 1.009 06/30/2020 1215   PHURINE 6.0 06/30/2020 1215   GLUCOSEU NEGATIVE 06/30/2020 1215   GLUCOSEU NEGATIVE 02/14/2020 1624   HGBUR SMALL (A) 06/30/2020 1215   BILIRUBINUR NEGATIVE 06/30/2020 1215   BILIRUBINUR Negative 06/18/2020 1338   KETONESUR NEGATIVE 06/30/2020 1215   PROTEINUR NEGATIVE 06/30/2020 1215   UROBILINOGEN 0.2 06/18/2020 1338   UROBILINOGEN 0.2 02/14/2020 1624   NITRITE POSITIVE (A) 06/30/2020 1215   LEUKOCYTESUR LARGE (A) 06/30/2020 1215   Sepsis Labs: Invalid input(s): PROCALCITONIN, [place]  Microbiology: Recent Results (from the past 240 hour(s))  Culture, blood (Routine x 2)     Status: None (Preliminary result)   Collection  Time: 06/30/20 10:59 AM   Specimen: BLOOD  Result Value Ref Range Status   Specimen Description BLOOD RIGHT ANTECUBITAL  Final   Special Requests   Final    BOTTLES DRAWN AEROBIC AND ANAEROBIC Blood Culture adequate volume   Culture   Final    NO GROWTH 4 DAYS Performed at [facility], [address], [place], [place] [address]    Report Status PENDING  Incomplete  Urine culture     Status: Abnormal   Collection Time: 06/30/20 11:17 AM   Specimen: Urine, Random  Result Value Ref Range Status   Specimen Description URINE, RANDOM  Final   Special Requests   Final    NONE Performed at [facility], [place] [address] [place], [place] [address]    Culture >=100,000 COLONIES/mL  ESCHERICHIA COLI (A)  Final   Report Status 07/02/2020 FINAL  Final   Organism ID, Bacteria ESCHERICHIA COLI (A)  Final      Susceptibility   Escherichia coli - MIC*    AMPICILLIN 8 SENSITIVE Sensitive     CEFAZOLIN <=4 SENSITIVE Sensitive     CEFTRIAXONE <=0.25 SENSITIVE Sensitive     CIPROFLOXACIN <=0.25 SENSITIVE Sensitive     GENTAMICIN <=1 SENSITIVE Sensitive     IMIPENEM <=0.25 SENSITIVE Sensitive     NITROFURANTOIN <=16 SENSITIVE Sensitive     TRIMETH/SULFA <=20 SENSITIVE Sensitive     AMPICILLIN/SULBACTAM 4 SENSITIVE Sensitive     PIP/TAZO <=4 SENSITIVE Sensitive     * >=100,000 COLONIES/mL ESCHERICHIA COLI  Culture, blood (Routine x 2)     Status: None (Preliminary result)   Collection Time: 06/30/20 12:13 PM   Specimen: BLOOD RIGHT HAND  Result Value Ref Range Status   Specimen Description BLOOD RIGHT HAND  Final   Special Requests   Final    BOTTLES DRAWN AEROBIC ONLY Blood Culture results may not be optimal due to an inadequate volume of blood received in culture bottles   Culture   Final    NO GROWTH 4 DAYS Performed at [facility], [address] [place], [place] [address]    Report Status PENDING  Incomplete  SARS Coronavirus 2 by RT PCR (hospital order, performed in  [facility] hospital lab) Nasopharyngeal Nasopharyngeal Swab     Status: None   Collection Time: 06/30/20  4:00 PM   Specimen: Nasopharyngeal Swab  Result Value Ref Range Status   SARS Coronavirus 2 NEGATIVE NEGATIVE Final    Comment: (NOTE) SARS-CoV-2 target nucleic acids are NOT DETECTED.  The SARS-CoV-2 RNA is generally detectable in upper and lower respiratory specimens during the acute phase of infection. The lowest concentration of SARS-CoV-2 viral copies this assay can detect is 250 copies / mL. A negative result does not preclude SARS-CoV-2 infection and should not be used as the sole basis for treatment or other patient management decisions.  A negative result may occur with improper specimen collection / handling, submission of specimen other than nasopharyngeal swab, presence of viral mutation(s) within the areas targeted by this assay, and inadequate number of viral copies (<250 copies / mL). A negative result must be combined with clinical observations, patient history, and epidemiological information.  Fact Sheet for Patients:   StrictlyIdeas.no  Fact Sheet for Healthcare Providers: BankingDealers.co.za  This test is not yet approved or  cleared by the [place] FDA and has been authorized for detection and/or diagnosis of SARS-CoV-2 by FDA under an Emergency Use Authorization (EUA).  This EUA will remain in effect (meaning this test can be used) for the duration of the COVID-19 declaration under Section 564(b)(1) of the Act, 21 U.S.C. section 360bbb-3(b)(1), unless the authorization is terminated or revoked sooner.  Performed at [facility], [place] [address] [place], [place] [address]     Radiology Studies: No results found.    Danyelle [place] T. [place]  If 7PM-7AM, please contact night-coverage [email or web address] Password [facility] 07/04/2020, 7:23 PM

## 2020-07-04 NOTE — Plan of Care (Signed)

## 2020-07-05 LAB — CULTURE, BLOOD (ROUTINE X 2)
Culture: NO GROWTH
Culture: NO GROWTH
Special Requests: ADEQUATE

## 2020-07-05 LAB — GLUCOSE, CAPILLARY
Glucose-Capillary: 120 mg/dL — ABNORMAL HIGH (ref 70–99)
Glucose-Capillary: 107 mg/dL — ABNORMAL HIGH (ref 70–99)

## 2020-07-05 MED ORDER — MORPHINE SULFATE (PF) 2 MG/ML IV SOLN
1.0000 mg | INTRAVENOUS | Status: DC | PRN
  Administered 2020-07-05 – 2020-07-06 (×5): 1 mg via INTRAVENOUS
  Filled 2020-07-05 (×5): qty 1

## 2020-07-05 NOTE — TOC Progression Note (Addendum)
Transition of Care [facility]) - Progression Note    Patient Details  Name: Shamila Lerch MRN: [id number] Date of Birth: 1931/04/11  [facility] of [facility]) CM/SW Contact  Joanne Chars, LCSW Phone Number: 07/05/2020, 11:04 AM  Clinical Narrative:   [facility] for past 2 days.  They have maintained contact daily.  Nephew aware and OK with either option once a bed opens up.  1300: Chyree from [facility] reports no beds today.  No beds in [place] either.    Expected Discharge Plan: [place] Barriers to Discharge: Continued Medical Work up, Hospice Bed not available  Expected Discharge Plan and Services Expected Discharge Plan: [place] In-house Referral: Clinical Social Work Discharge Planning Services: CM Consult   Living arrangements for the past 2 months: Single Family Home Expected Discharge Date: 07/05/20                                     Social Determinants of Health (SDOH) Interventions    Readmission Risk Interventions No flowsheet data found.

## 2020-07-05 NOTE — Progress Notes (Signed)
Manufacturing engineer [facility]) Hospital Liaison note.    Received request from [place] for continued family interest in [facility]. [facility] is unable to offer a room today. Hospital Liaison will follow up tomorrow or sooner if a room becomes available.    Please do not hesitate to call with questions.    Thank you for the opportunity to participate in this patients care.  Chrislyn Edison Pace, BSN, RN [place] (listed on [place] under Hospice[facility])    [phone]

## 2020-07-05 NOTE — Plan of Care (Signed)
  Problem: Clinical Measurements: Goal: Ability to maintain clinical measurements within normal limits will improve Outcome: Progressing   Problem: Education: Goal: Knowledge of General Education information will improve Description: Including pain rating scale, medication(s)/side effects and non-pharmacologic comfort measures Outcome: Not Progressing   Problem: Health Behavior/Discharge Planning: Goal: Ability to manage health-related needs will improve Outcome: Not Progressing   Problem: Clinical Measurements: Goal: Will remain free from infection Outcome: Not Progressing Goal: Diagnostic test results will improve Outcome: Not Progressing Goal: Respiratory complications will improve Outcome: Not Progressing Goal: Cardiovascular complication will be avoided Outcome: Not Progressing   Problem: Activity: Goal: Risk for activity intolerance will decrease Outcome: Not Progressing   Problem: Nutrition: Goal: Adequate nutrition will be maintained Outcome: Not Progressing   Problem: Coping: Goal: Level of anxiety will decrease Outcome: Not Progressing   Problem: Elimination: Goal: Will not experience complications related to bowel motility Outcome: Not Progressing Goal: Will not experience complications related to urinary retention Outcome: Not Progressing   Problem: Pain Managment: Goal: General experience of comfort will improve Outcome: Not Progressing   Problem: Safety: Goal: Ability to remain free from injury will improve Outcome: Not Progressing   Problem: Skin Integrity: Goal: Risk for impaired skin integrity will decrease Outcome: Not Progressing

## 2020-07-05 NOTE — Progress Notes (Signed)
PROGRESS NOTE  Elizabeth [name] EXH:[id number] DOB: 06-16-1931   PCP: Binnie Rail, MD  Patient is from: SNF  DOA: 06/30/2020 LOS: 5  Brief Narrative / Interim history: 86 year old with Parkinson's disease, history of CVA and severe protein calorie malnutrition was sent from nursing home for being unresponsive.  Work-up revealed a large MCA stroke.  Patient is presently on palliative care for comfort care only.  She is awaiting transfer to inpatient hospice.  Subjective: Seen and examined earlier this morning.  No major events overnight of this morning.  Seems comfortable.  Objective: Vitals:   07/03/20 1955 07/04/20 0732 07/04/20 2015 07/05/20 0830  BP: (!) 160/60 (!) 152/83 (!) 144/78 131/84  Pulse: 93 95 98 (!) 117  Resp: 18 16 18    Temp: 99.9 F (37.7 C) 98.8 F (37.1 C) 98.9 F (37.2 C) 98.2 F (36.8 C)  TempSrc: Axillary  Axillary Axillary  SpO2: 99% 95% 95% 95%  Weight:      Height:        Intake/Output Summary (Last 24 hours) at 07/05/2020 1623 Last data filed at 07/05/2020 1200 Gross per 24 hour  Intake 0 ml  Output --  Net 0 ml   Filed Weights   06/30/20 1056 07/01/20 2144  Weight: 29.5 kg 33.2 kg    Examination:  GENERAL: Frail looking elderly female.  No distress. RESP:  No IWOB.   NEURO: Sleepy but wakes to voice. PSYCH: Calm.  No distress or agitation.  Procedures:  None  Microbiology summarized: COVID-19 PCR negative. Urine culture with pansensitive E. Coli. Blood cultures NGTD.  Assessment & Plan: End-of-life care/comfort care only/DNR/DNI-initiated by previous attending after discussion with patient's nephew/HC POA -Comfort care pathway -Waiting for residential hospice.  Acute left MCA stroke without mass effect: MRI brain obtained showed 3x3x4 cm left MCA stroke, consistent with the patient's massive aphasia and paralysis. She sas not a tPA candidate at the time of arrival.  -Comfort pathway as above  Bacteriuria/possible E. coli  UTI  Parkinson's disease  Hypothyroidism  Debility/physical deconditioning  Severe protein calorie malnutrition/failure to thrive Body mass index is 16.41 kg/m.         DVT prophylaxis:    Code Status: DNR/DNI Family Communication: None at bedside Status is: Inpatient  Remains inpatient appropriate because:Unsafe d/c plan   Dispo: The patient is from: SNF              Anticipated d/c is to: Residential hospice              Anticipated d/c date is: 1 day              Patient currently is medically stable to d/c.       Consultants:  Neurology Palliative medicine   Sch Meds:  Scheduled Meds:  sodium chloride flush  3 mL Intravenous Q12H   Continuous Infusions: PRN Meds:.acetaminophen **OR** acetaminophen, albuterol, antiseptic oral rinse, glycopyrrolate **OR** glycopyrrolate **OR** glycopyrrolate, haloperidol **OR** haloperidol **OR** haloperidol lactate, lidocaine, morphine injection, morphine CONCENTRATE, ondansetron **OR** ondansetron (ZOFRAN) IV, polyvinyl alcohol  Antimicrobials: Anti-infectives (From admission, onward)   Start     Dose/Rate Route Frequency Ordered Stop   07/01/20 1200  cefTRIAXone (ROCEPHIN) 1 g in sodium chloride 0.9 % 100 mL IVPB  Status:  Discontinued        1 g 200 mL/hr over 30 Minutes Intravenous Every 24 hours 06/30/20 1445 06/30/20 1723   06/30/20 1800  piperacillin-tazobactam (ZOSYN) IVPB 3.375 g  Status:  Discontinued  3.375 g 12.5 mL/hr over 240 Minutes Intravenous Every 8 hours 06/30/20 1744 07/01/20 1358   06/30/20 1230  cefTRIAXone (ROCEPHIN) 1 g in sodium chloride 0.9 % 100 mL IVPB        1 g 200 mL/hr over 30 Minutes Intravenous  Once 06/30/20 1228 06/30/20 1405       I have personally reviewed the following labs and images: CBC: Recent Labs  Lab 06/30/20 1059 07/01/20 0131  WBC 5.1 10.2  NEUTROABS 3.4  --   HGB 10.5* 10.6*  HCT 33.7* 33.1*  MCV 91.1 89.0  PLT 166 198   BMP &GFR Recent Labs  Lab  06/30/20 1059 07/01/20 0131  NA 142 142  K 4.2 3.6  CL 109 108  CO2 23 20*  GLUCOSE 78 94  BUN 19 22  CREATININE 0.68 1.01*  CALCIUM 8.6* 8.7*   Estimated Creatinine Clearance: 20.6 mL/min (A) (by C-G formula based on SCr of 1.01 mg/dL (H)). Liver & Pancreas: Recent Labs  Lab 06/30/20 1059  AST 19  ALT 6  ALKPHOS 67  BILITOT 1.3*  PROT 5.5*  ALBUMIN 3.1*   No results for input(s): LIPASE, AMYLASE in the last 168 hours. No results for input(s): AMMONIA in the last 168 hours. Diabetic: No results for input(s): HGBA1C in the last 72 hours. Recent Labs  Lab 07/01/20 0818 07/01/20 1203 07/01/20 1357 07/05/20 0838 07/05/20 1212  GLUCAP 73 69* 180* 120* 107*   Cardiac Enzymes: No results for input(s): CKTOTAL, CKMB, CKMBINDEX, TROPONINI in the last 168 hours. No results for input(s): PROBNP in the last 8760 hours. Coagulation Profile: Recent Labs  Lab 06/30/20 1059  INR 1.1   Thyroid Function Tests: No results for input(s): TSH, T4TOTAL, FREET4, T3FREE, THYROIDAB in the last 72 hours. Lipid Profile: No results for input(s): CHOL, HDL, LDLCALC, TRIG, CHOLHDL, LDLDIRECT in the last 72 hours. Anemia Panel: No results for input(s): VITAMINB12, FOLATE, FERRITIN, TIBC, IRON, RETICCTPCT in the last 72 hours. Urine analysis:    Component Value Date/Time   COLORURINE YELLOW 06/30/2020 1215   APPEARANCEUR HAZY (A) 06/30/2020 1215   LABSPEC 1.009 06/30/2020 1215   PHURINE 6.0 06/30/2020 1215   GLUCOSEU NEGATIVE 06/30/2020 1215   GLUCOSEU NEGATIVE 02/14/2020 1624   HGBUR SMALL (A) 06/30/2020 1215   BILIRUBINUR NEGATIVE 06/30/2020 1215   BILIRUBINUR Negative 06/18/2020 1338   KETONESUR NEGATIVE 06/30/2020 1215   PROTEINUR NEGATIVE 06/30/2020 1215   UROBILINOGEN 0.2 06/18/2020 1338   UROBILINOGEN 0.2 02/14/2020 1624   NITRITE POSITIVE (A) 06/30/2020 1215   LEUKOCYTESUR LARGE (A) 06/30/2020 1215   Sepsis Labs: Invalid input(s): PROCALCITONIN,  [place]  Microbiology: Recent Results (from the past 240 hour(s))  Culture, blood (Routine x 2)     Status: None   Collection Time: 06/30/20 10:59 AM   Specimen: BLOOD  Result Value Ref Range Status   Specimen Description BLOOD RIGHT ANTECUBITAL  Final   Special Requests   Final    BOTTLES DRAWN AEROBIC AND ANAEROBIC Blood Culture adequate volume   Culture   Final    NO GROWTH 5 DAYS Performed at [facility], [address] [place], [place] [address]    Report Status 07/05/2020 FINAL  Final  Urine culture     Status: Abnormal   Collection Time: 06/30/20 11:17 AM   Specimen: Urine, Random  Result Value Ref Range Status   Specimen Description URINE, RANDOM  Final   Special Requests   Final    NONE Performed at [facility]  [facility], [place] [address] [place], [place] [address]    Culture >=100,000 COLONIES/mL ESCHERICHIA COLI (A)  Final   Report Status 07/02/2020 FINAL  Final   Organism ID, Bacteria ESCHERICHIA COLI (A)  Final      Susceptibility   Escherichia coli - MIC*    AMPICILLIN 8 SENSITIVE Sensitive     CEFAZOLIN <=4 SENSITIVE Sensitive     CEFTRIAXONE <=0.25 SENSITIVE Sensitive     CIPROFLOXACIN <=0.25 SENSITIVE Sensitive     GENTAMICIN <=1 SENSITIVE Sensitive     IMIPENEM <=0.25 SENSITIVE Sensitive     NITROFURANTOIN <=16 SENSITIVE Sensitive     TRIMETH/SULFA <=20 SENSITIVE Sensitive     AMPICILLIN/SULBACTAM 4 SENSITIVE Sensitive     PIP/TAZO <=4 SENSITIVE Sensitive     * >=100,000 COLONIES/mL ESCHERICHIA COLI  Culture, blood (Routine x 2)     Status: None   Collection Time: 06/30/20 12:13 PM   Specimen: BLOOD RIGHT HAND  Result Value Ref Range Status   Specimen Description BLOOD RIGHT HAND  Final   Special Requests   Final    BOTTLES DRAWN AEROBIC ONLY Blood Culture results may not be optimal due to an inadequate volume of blood received in culture bottles   Culture   Final    NO GROWTH 5 DAYS Performed at [facility], [place] [address],  [place], [place] [address]    Report Status 07/05/2020 FINAL  Final  SARS Coronavirus 2 by RT PCR (hospital order, performed in [facility] hospital lab) Nasopharyngeal Nasopharyngeal Swab     Status: None   Collection Time: 06/30/20  4:00 PM   Specimen: Nasopharyngeal Swab  Result Value Ref Range Status   SARS Coronavirus 2 NEGATIVE NEGATIVE Final    Comment: (NOTE) SARS-CoV-2 target nucleic acids are NOT DETECTED.  The SARS-CoV-2 RNA is generally detectable in upper and lower respiratory specimens during the acute phase of infection. The lowest concentration of SARS-CoV-2 viral copies this assay can detect is 250 copies / mL. A negative result does not preclude SARS-CoV-2 infection and should not be used as the sole basis for treatment or other patient management decisions.  A negative result may occur with improper specimen collection / handling, submission of specimen other than nasopharyngeal swab, presence of viral mutation(s) within the areas targeted by this assay, and inadequate number of viral copies (<250 copies / mL). A negative result must be combined with clinical observations, patient history, and epidemiological information.  Fact Sheet for Patients:   StrictlyIdeas.no  Fact Sheet for Healthcare Providers: BankingDealers.co.za  This test is not yet approved or  cleared by the [place] FDA and has been authorized for detection and/or diagnosis of SARS-CoV-2 by FDA under an Emergency Use Authorization (EUA).  This EUA will remain in effect (meaning this test can be used) for the duration of the COVID-19 declaration under Section 564(b)(1) of the Act, 21 U.S.C. section 360bbb-3(b)(1), unless the authorization is terminated or revoked sooner.  Performed at [facility], [place] [address] [place], [place] [address]     Radiology Studies: No results found.    Shakina Choy T. [place]  If 7PM-7AM,  please contact night-coverage [email or web address] Password [facility] 07/05/2020, 4:23 PM

## 2020-07-06 NOTE — Discharge Summary (Signed)
Physician Discharge Summary  Elizabeth [name] OMB:[id number] DOB: September 28, 1931 DOA: 06/30/2020  PCP: Binnie Rail, MD  Admit date: 06/30/2020 Discharge date: 07/06/2020  Admitted From: SNF Disposition: Residential hospice/[facility]  Discharge Condition: Poor prognosis CODE STATUS: DNR/DNI  Hospital Course: 86 year old with Parkinson's disease, history of CVA and severe protein calorie malnutrition was sent from nursing home for being unresponsive. Work-up revealed a large MCA stroke.  Palliative medicine consulted.  Eventually transition to full comfort care after discussion between previous attending, PMT and patient's nephew (HCPOA). Patient is transferred to residential hospice at [facility] for end-of-life care.  See individual problem list below for more hospital course.  Discharge Diagnoses:  End-of-life care/comfort care only/DNR/DNI-initiated by previous attending after discussion with patient's nephew/HCPOA -Transfer to [facility] for end-of-life care -Rx for Roxanol, Ativan, Zofran and glycopyrrolate as below  Acute left MCA stroke without mass effect: MRI brain obtained showed 3x3x4 cm left MCA stroke, consistent with the patient's massive aphasia and paralysis. She sas not a tPA candidate at the time of arrival.  -Comfort pathway as above  Bacteriuria/possible E. coli UTI  Parkinson's disease  Hypothyroidism  Debility/physical deconditioning  Severe protein calorie malnutrition/failure to thrive   Body mass index is 16.41 kg/m.            Discharge Exam: Vitals:   07/05/20 2317 07/06/20 0810  BP: 114/79 96/65  Pulse: (!) 102 (!) 108  Resp: 20 17  Temp: 97.6 F (36.4 C)   SpO2: 94% 93%   GENERAL: Frail looking elderly female.  No distress. RESP:  No IWOB.   NEURO: Sleepy but wakes to voice. PSYCH: Calm.  No distress or agitation.  Discharge Instructions   Allergies as of 07/06/2020      Reactions   Atorvastatin Other (See  Comments)   REACTION: Felt funny (only way patient could describe)   Crestor [rosuvastatin Calcium] Other (See Comments)   Leg cramps, muscle aches   Vesicare [solifenacin] Other (See Comments)   constipation      Medication List    STOP taking these medications   cefUROXime 250 MG tablet Commonly known as: CEFTIN   levothyroxine 88 MCG tablet Commonly known as: SYNTHROID   LIDOCAINE EX   melatonin 1 MG Tabs tablet   mirtazapine 15 MG tablet Commonly known as: Remeron   Nuplazid 34 MG Caps Generic drug: Pimavanserin Tartrate   Rytary 36.25-145 MG Cpcr Generic drug: Carbidopa-Levodopa ER   traMADol 50 MG tablet Commonly known as: ULTRAM   vitamin B-12 1000 MCG tablet Commonly known as: CYANOCOBALAMIN   Vitamin D 50 MCG (2000 UT) tablet     TAKE these medications   Glycopyrrolate 1 MG/5ML Soln Take 5 mLs (1 mg total) by mouth 4 (four) times daily as needed for up to 3 days.   LORazepam 0.5 MG tablet Commonly known as: Ativan Take 1 tablet (0.5 mg total) by mouth every 4 (four) hours as needed for up to 20 doses for anxiety.   morphine CONCENTRATE 10 mg / 0.5 ml concentrated solution Take 0.5 mLs (10 mg total) by mouth every 3 (three) hours as needed for moderate pain, severe pain or shortness of breath.   ondansetron 4 MG disintegrating tablet Commonly known as: Zofran ODT Take 1 tablet (4 mg total) by mouth every 8 (eight) hours as needed for nausea or vomiting.       Consultations:  Palliative medicine  Neurology  Procedures/Studies:   DG Abd 1 View  Result Date:  07/01/2020 CLINICAL DATA:  NG tube placement EXAM: ABDOMEN - 1 VIEW COMPARISON:  None. FINDINGS: Enteric tube terminates over the lower thoracic esophagus, recommend advancing 10 cm. No dilated small bowel loops. Moderate colonic stool. No evidence of pneumatosis or pneumoperitoneum. Clear lung bases. IMPRESSION: 1. Enteric tube terminates over the lower thoracic esophagus, recommend  advancing 10 cm. 2. Nonobstructive bowel gas pattern. Moderate colonic stool, which may indicate constipation. These results will be called to the ordering clinician or representative by the Radiologist Assistant, and communication documented in the PACS or [employer]. Electronically Signed   By: Ilona Sorrel M.D.   On: 07/01/2020 10:59   CT Head Wo Contrast  Result Date: 06/30/2020 CLINICAL DATA:  Altered mental status EXAM: CT HEAD WITHOUT CONTRAST TECHNIQUE: Contiguous axial images were obtained from the base of the skull through the vertex without intravenous contrast. COMPARISON:  01/31/2020 FINDINGS: Brain: No evidence of acute infarction, hemorrhage, hydrocephalus, extra-axial collection or mass lesion/mass effect. Encephalomalacia related to remote infarct in the right occipital lobe. Multiple remote left basal ganglia lacunar infarcts. Extensive low-density changes within the periventricular and subcortical white matter compatible with chronic microvascular ischemic change. Mild-moderate diffuse cerebral volume loss. Vascular: Atherosclerotic calcifications involving the large vessels of the skull base. No unexpected hyperdense vessel. Skull: Normal. Negative for fracture or focal lesion. Sinuses/Orbits: Mild left maxillary mucosal thickening. Paranasal sinuses and mastoid air cells are otherwise clear. Orbital structures intact. Other: None. IMPRESSION: 1. No acute intracranial findings. 2. Chronic microvascular ischemic change and cerebral volume loss. Electronically Signed   By: Davina Poke D.O.   On: 06/30/2020 13:25   MR BRAIN WO CONTRAST  Addendum Date: 07/01/2020   ADDENDUM REPORT: 07/01/2020 13:25 ADDENDUM: These results were called by telephone at the time of interpretation on 07/01/2020 at 1:24 pm to provider [facility] , who verbally acknowledged these results. Electronically Signed   By: Kellie Simmering DO   On: 07/01/2020 13:25   Result Date: 07/01/2020 CLINICAL DATA:   Mental status change, unknown cause. EXAM: MRI HEAD WITHOUT CONTRAST TECHNIQUE: Multiplanar, multiecho pulse sequences of the brain and surrounding structures were obtained without intravenous contrast. COMPARISON:  Prior head CT examinations 06/30/2020 and earlier, brain MRI 04/20/2008 FINDINGS: Brain: Mild generalized parenchymal atrophy. There is a 3.7 x 3.7 x 4.3 cm region of cortical/subcortical restricted diffusion consistent with acute infarction within the left MCA vascular territory. This infarct is centered within the left frontal operculum and left insula. Corresponding T2/FLAIR hyperintensity at this site. Redemonstrated chronic cortically based infarct within the right occipital lobe. Background moderate patchy T2/FLAIR hyperintensity within the cerebral white matter and pons which is nonspecific, but consistent with chronic small vessel ischemic disease. Chronic lacunar infarcts within the basal ganglia bilaterally. No evidence of intracranial mass. No chronic intracranial blood products. No extra-axial fluid collection. No midline shift. Vascular: No definite loss of expected proximal large arterial flow voids. Skull and upper cervical spine: No focal marrow lesion. Sinuses/Orbits: Visualized orbits show no acute finding. Mild ethmoid sinus mucosal thickening. No significant mastoid effusion. IMPRESSION: 3.7 x 3.7 x 4.3 cm left MCA vascular territory acute infarct centered within the left frontal operculum and left insula. No significant mass effect at this time. No midline shift. Redemonstrated chronic cortically-based right occipital lobe infarct. Background mild generalized parenchymal atrophy and moderate chronic small vessel ischemic disease. Redemonstrated chronic bilateral basal ganglia lacunar infarcts. Mild ethmoid sinus mucosal thickening. Electronically Signed: By: Kellie Simmering DO On: 07/01/2020 11:40   DG Chest  Port 1 View  Result Date: 06/30/2020 CLINICAL DATA:  Unresponsive EXAM:  PORTABLE CHEST 1 VIEW COMPARISON:  None. FINDINGS: Normal cardiac silhouette. Lungs are hyperinflated. No effusion, infiltrate or pneumothorax. No focal consolidation. IMPRESSION: No acute cardiopulmonary process. Electronically Signed   By: Suzy Bouchard M.D.   On: 06/30/2020 14:10        The results of significant diagnostics from this hospitalization (including imaging, microbiology, ancillary and laboratory) are listed below for reference.     Microbiology: Recent Results (from the past 240 hour(s))  Culture, blood (Routine x 2)     Status: None   Collection Time: 06/30/20 10:59 AM   Specimen: BLOOD  Result Value Ref Range Status   Specimen Description BLOOD RIGHT ANTECUBITAL  Final   Special Requests   Final    BOTTLES DRAWN AEROBIC AND ANAEROBIC Blood Culture adequate volume   Culture   Final    NO GROWTH 5 DAYS Performed at [facility], [address], [place], [place] [address]    Report Status 07/05/2020 FINAL  Final  Urine culture     Status: Abnormal   Collection Time: 06/30/20 11:17 AM   Specimen: Urine, Random  Result Value Ref Range Status   Specimen Description URINE, RANDOM  Final   Special Requests   Final    NONE Performed at [facility], [place] [address], [place], [place] [address]    Culture >=100,000 COLONIES/mL ESCHERICHIA COLI (A)  Final   Report Status 07/02/2020 FINAL  Final   Organism ID, Bacteria ESCHERICHIA COLI (A)  Final      Susceptibility   Escherichia coli - MIC*    AMPICILLIN 8 SENSITIVE Sensitive     CEFAZOLIN <=4 SENSITIVE Sensitive     CEFTRIAXONE <=0.25 SENSITIVE Sensitive     CIPROFLOXACIN <=0.25 SENSITIVE Sensitive     GENTAMICIN <=1 SENSITIVE Sensitive     IMIPENEM <=0.25 SENSITIVE Sensitive     NITROFURANTOIN <=16 SENSITIVE Sensitive     TRIMETH/SULFA <=20 SENSITIVE Sensitive     AMPICILLIN/SULBACTAM 4 SENSITIVE Sensitive     PIP/TAZO <=4 SENSITIVE Sensitive     * >=100,000 COLONIES/mL ESCHERICHIA COLI  Culture,  blood (Routine x 2)     Status: None   Collection Time: 06/30/20 12:13 PM   Specimen: BLOOD RIGHT HAND  Result Value Ref Range Status   Specimen Description BLOOD RIGHT HAND  Final   Special Requests   Final    BOTTLES DRAWN AEROBIC ONLY Blood Culture results may not be optimal due to an inadequate volume of blood received in culture bottles   Culture   Final    NO GROWTH 5 DAYS Performed at [facility], [place] [address] [place], [place] [address]    Report Status 07/05/2020 FINAL  Final  SARS Coronavirus 2 by RT PCR (hospital order, performed in [facility] hospital lab) Nasopharyngeal Nasopharyngeal Swab     Status: None   Collection Time: 06/30/20  4:00 PM   Specimen: Nasopharyngeal Swab  Result Value Ref Range Status   SARS Coronavirus 2 NEGATIVE NEGATIVE Final    Comment: (NOTE) SARS-CoV-2 target nucleic acids are NOT DETECTED.  The SARS-CoV-2 RNA is generally detectable in upper and lower respiratory specimens during the acute phase of infection. The lowest concentration of SARS-CoV-2 viral copies this assay can detect is 250 copies / mL. A negative result does not preclude SARS-CoV-2 infection and should not be used as the sole basis for treatment or other patient  management decisions.  A negative result may occur with improper specimen collection / handling, submission of specimen other than nasopharyngeal swab, presence of viral mutation(s) within the areas targeted by this assay, and inadequate number of viral copies (<250 copies / mL). A negative result must be combined with clinical observations, patient history, and epidemiological information.  Fact Sheet for Patients:   StrictlyIdeas.no  Fact Sheet for Healthcare Providers: BankingDealers.co.za  This test is not yet approved or  cleared by the [place] FDA and has been authorized for detection and/or diagnosis of SARS-CoV-2 by FDA under an Emergency Use  Authorization (EUA).  This EUA will remain in effect (meaning this test can be used) for the duration of the COVID-19 declaration under Section 564(b)(1) of the Act, 21 U.S.C. section 360bbb-3(b)(1), unless the authorization is terminated or revoked sooner.  Performed at [facility], [place] [address] [place], [place] [address]      Labs: BNP (last 3 results) No results for input(s): BNP in the last 8760 hours. Basic Metabolic Panel: Recent Labs  Lab 06/30/20 1059 07/01/20 0131  NA 142 142  K 4.2 3.6  CL 109 108  CO2 23 20*  GLUCOSE 78 94  BUN 19 22  CREATININE 0.68 1.01*  CALCIUM 8.6* 8.7*   Liver Function Tests: Recent Labs  Lab 06/30/20 1059  AST 19  ALT 6  ALKPHOS 67  BILITOT 1.3*  PROT 5.5*  ALBUMIN 3.1*   No results for input(s): LIPASE, AMYLASE in the last 168 hours. No results for input(s): AMMONIA in the last 168 hours. CBC: Recent Labs  Lab 06/30/20 1059 07/01/20 0131  WBC 5.1 10.2  NEUTROABS 3.4  --   HGB 10.5* 10.6*  HCT 33.7* 33.1*  MCV 91.1 89.0  PLT 166 198   Cardiac Enzymes: No results for input(s): CKTOTAL, CKMB, CKMBINDEX, TROPONINI in the last 168 hours. BNP: Invalid input(s): POCBNP CBG: Recent Labs  Lab 07/01/20 0818 07/01/20 1203 07/01/20 1357 07/05/20 0838 07/05/20 1212  GLUCAP 73 69* 180* 120* 107*   D-Dimer No results for input(s): DDIMER in the last 72 hours. Hgb A1c No results for input(s): HGBA1C in the last 72 hours. Lipid Profile No results for input(s): CHOL, HDL, LDLCALC, TRIG, CHOLHDL, LDLDIRECT in the last 72 hours. Thyroid function studies No results for input(s): TSH, T4TOTAL, T3FREE, THYROIDAB in the last 72 hours.  Invalid input(s): FREET3 Anemia work up No results for input(s): VITAMINB12, FOLATE, FERRITIN, TIBC, IRON, RETICCTPCT in the last 72 hours. Urinalysis    Component Value Date/Time   COLORURINE YELLOW 06/30/2020 1215   APPEARANCEUR HAZY (A) 06/30/2020 1215   LABSPEC 1.009  06/30/2020 1215   PHURINE 6.0 06/30/2020 1215   GLUCOSEU NEGATIVE 06/30/2020 1215   GLUCOSEU NEGATIVE 02/14/2020 1624   HGBUR SMALL (A) 06/30/2020 1215   BILIRUBINUR NEGATIVE 06/30/2020 1215   BILIRUBINUR Negative 06/18/2020 1338   KETONESUR NEGATIVE 06/30/2020 1215   PROTEINUR NEGATIVE 06/30/2020 1215   UROBILINOGEN 0.2 06/18/2020 1338   UROBILINOGEN 0.2 02/14/2020 1624   NITRITE POSITIVE (A) 06/30/2020 1215   LEUKOCYTESUR LARGE (A) 06/30/2020 1215   Sepsis Labs Invalid input(s): PROCALCITONIN,  WBC,  LACTICIDVEN   Time coordinating discharge: 25 minutes  SIGNED:  Mercy Riding, MD  [facility] 07/06/2020, 3:13 PM  If 7PM-7AM, please contact night-coverage [email or web address] Password [email or web address]

## 2020-07-06 NOTE — Progress Notes (Addendum)
Manufacturing engineer [facility])  [facility] has a bed for Elizabeth [name] today.  Confirmed interest with family.  Necessary consents have to be completed prior to transport.  [facility] will update TOC manager once this is complete and then transportation can be arranged.  RN staff, please call report to [phone] at any time.  Once complete, please fax d/c summary to [phone]  Venia Carbon BSN, RN, [facility] Liaison  **necessary consents are completed, transport can be arranged at this time. Updated TOC manager.

## 2020-07-06 NOTE — TOC Progression Note (Signed)
Transition of Care [facility]) - Progression Note    Patient Details  Name: Elizabeth [name] MRN: [id number] Date of Birth: [birth year]  Transition of Care [facility]) CM/SW Contact  Joanne Chars, LCSW Phone Number: 07/06/2020, 2:20 PM  Clinical Narrative:   Pt transferring to [facility].  Transport by [employer].    Expected Discharge Plan: [place] Barriers to Discharge: No Barriers Identified  Expected Discharge Plan and Services Expected Discharge Plan: [place] In-house Referral: Clinical Social Work Discharge Planning Services: CM Consult   Living arrangements for the past 2 months: Single Family Home Expected Discharge Date: 07/06/20                                     Social Determinants of Health (SDOH) Interventions    Readmission Risk Interventions No flowsheet data found.

## 2020-07-06 NOTE — Progress Notes (Signed)
RN gave report to Doctor, general practice at [employer] [facility].

## 2020-07-25 DEATH — deceased

## 2020-09-28 ENCOUNTER — Ambulatory Visit: Payer: Medicare Other | Admitting: Neurology

## 2020-10-04 ENCOUNTER — Ambulatory Visit: Payer: Medicare Other | Admitting: Neurology
